# Patient Record
Sex: Female | Born: 1970 | Race: White | Hispanic: No | Marital: Married | Smoking: Former smoker
Health system: Southern US, Community
[De-identification: ages and names within clinical notes are randomized; demographics above are authoritative.]

## PROBLEM LIST (undated history)

## (undated) DIAGNOSIS — F32A Depression, unspecified: Secondary | ICD-10-CM

## (undated) DIAGNOSIS — E785 Hyperlipidemia, unspecified: Secondary | ICD-10-CM

## (undated) DIAGNOSIS — N946 Dysmenorrhea, unspecified: Secondary | ICD-10-CM

## (undated) DIAGNOSIS — K829 Disease of gallbladder, unspecified: Secondary | ICD-10-CM

## (undated) DIAGNOSIS — M2550 Pain in unspecified joint: Secondary | ICD-10-CM

## (undated) DIAGNOSIS — E669 Obesity, unspecified: Secondary | ICD-10-CM

## (undated) DIAGNOSIS — M7989 Other specified soft tissue disorders: Secondary | ICD-10-CM

## (undated) DIAGNOSIS — I10 Essential (primary) hypertension: Secondary | ICD-10-CM

## (undated) DIAGNOSIS — M549 Dorsalgia, unspecified: Secondary | ICD-10-CM

## (undated) DIAGNOSIS — F329 Major depressive disorder, single episode, unspecified: Secondary | ICD-10-CM

## (undated) DIAGNOSIS — R7303 Prediabetes: Secondary | ICD-10-CM

## (undated) DIAGNOSIS — E039 Hypothyroidism, unspecified: Secondary | ICD-10-CM

## (undated) DIAGNOSIS — N83209 Unspecified ovarian cyst, unspecified side: Secondary | ICD-10-CM

## (undated) DIAGNOSIS — K5900 Constipation, unspecified: Secondary | ICD-10-CM

## (undated) DIAGNOSIS — M1990 Unspecified osteoarthritis, unspecified site: Secondary | ICD-10-CM

## (undated) DIAGNOSIS — R51 Headache: Secondary | ICD-10-CM

## (undated) HISTORY — DX: Disease of gallbladder, unspecified: K82.9

## (undated) HISTORY — DX: Unspecified ovarian cyst, unspecified side: N83.209

## (undated) HISTORY — DX: Hyperlipidemia, unspecified: E78.5

## (undated) HISTORY — DX: Prediabetes: R73.03

## (undated) HISTORY — DX: Obesity, unspecified: E66.9

## (undated) HISTORY — DX: Hypothyroidism, unspecified: E03.9

## (undated) HISTORY — DX: Dysmenorrhea, unspecified: N94.6

## (undated) HISTORY — DX: Dorsalgia, unspecified: M54.9

## (undated) HISTORY — PX: HIP SURGERY: SHX245

## (undated) HISTORY — DX: Constipation, unspecified: K59.00

## (undated) HISTORY — DX: Unspecified osteoarthritis, unspecified site: M19.90

## (undated) HISTORY — DX: Other specified soft tissue disorders: M79.89

## (undated) HISTORY — DX: Major depressive disorder, single episode, unspecified: F32.9

## (undated) HISTORY — PX: ENDOMETRIAL ABLATION: SHX621

## (undated) HISTORY — PX: OTHER SURGICAL HISTORY: SHX169

## (undated) HISTORY — DX: Headache: R51

## (undated) HISTORY — DX: Essential (primary) hypertension: I10

## (undated) HISTORY — DX: Pain in unspecified joint: M25.50

## (undated) HISTORY — DX: Depression, unspecified: F32.A

## (undated) HISTORY — PX: NASAL SINUS SURGERY: SHX719

---

## 1998-08-27 ENCOUNTER — Other Ambulatory Visit: Admission: RE | Admit: 1998-08-27 | Discharge: 1998-08-27 | Payer: Self-pay | Admitting: Obstetrics & Gynecology

## 1998-09-27 ENCOUNTER — Other Ambulatory Visit: Admission: RE | Admit: 1998-09-27 | Discharge: 1998-09-27 | Payer: Self-pay | Admitting: Obstetrics & Gynecology

## 1998-12-23 ENCOUNTER — Other Ambulatory Visit: Admission: RE | Admit: 1998-12-23 | Discharge: 1998-12-23 | Payer: Self-pay | Admitting: Obstetrics & Gynecology

## 1999-06-16 ENCOUNTER — Other Ambulatory Visit: Admission: RE | Admit: 1999-06-16 | Discharge: 1999-06-16 | Payer: Self-pay | Admitting: Obstetrics & Gynecology

## 2000-03-30 ENCOUNTER — Other Ambulatory Visit: Admission: RE | Admit: 2000-03-30 | Discharge: 2000-03-30 | Payer: Self-pay | Admitting: Obstetrics & Gynecology

## 2000-07-07 ENCOUNTER — Ambulatory Visit (HOSPITAL_COMMUNITY): Admission: RE | Admit: 2000-07-07 | Discharge: 2000-07-07 | Payer: Self-pay | Admitting: Obstetrics and Gynecology

## 2000-07-07 ENCOUNTER — Encounter: Payer: Self-pay | Admitting: Obstetrics & Gynecology

## 2000-08-20 ENCOUNTER — Inpatient Hospital Stay (HOSPITAL_COMMUNITY): Admission: AD | Admit: 2000-08-20 | Discharge: 2000-08-20 | Payer: Self-pay | Admitting: Obstetrics and Gynecology

## 2000-09-09 ENCOUNTER — Inpatient Hospital Stay (HOSPITAL_COMMUNITY): Admission: AD | Admit: 2000-09-09 | Discharge: 2000-09-09 | Payer: Self-pay | Admitting: Obstetrics and Gynecology

## 2000-09-10 ENCOUNTER — Encounter: Payer: Self-pay | Admitting: Obstetrics and Gynecology

## 2000-09-10 ENCOUNTER — Inpatient Hospital Stay (HOSPITAL_COMMUNITY): Admission: AD | Admit: 2000-09-10 | Discharge: 2000-09-10 | Payer: Self-pay | Admitting: Obstetrics and Gynecology

## 2000-09-23 ENCOUNTER — Inpatient Hospital Stay (HOSPITAL_COMMUNITY): Admission: AD | Admit: 2000-09-23 | Discharge: 2000-09-28 | Payer: Self-pay | Admitting: Obstetrics & Gynecology

## 2000-11-05 ENCOUNTER — Other Ambulatory Visit: Admission: RE | Admit: 2000-11-05 | Discharge: 2000-11-05 | Payer: Self-pay | Admitting: Obstetrics & Gynecology

## 2002-01-25 ENCOUNTER — Other Ambulatory Visit: Admission: RE | Admit: 2002-01-25 | Discharge: 2002-01-25 | Payer: Self-pay | Admitting: Obstetrics & Gynecology

## 2002-09-05 ENCOUNTER — Encounter: Admission: RE | Admit: 2002-09-05 | Discharge: 2002-09-05 | Payer: Self-pay | Admitting: Family Medicine

## 2002-09-05 ENCOUNTER — Encounter: Payer: Self-pay | Admitting: Family Medicine

## 2002-09-06 ENCOUNTER — Encounter: Payer: Self-pay | Admitting: Emergency Medicine

## 2002-09-06 ENCOUNTER — Emergency Department (HOSPITAL_COMMUNITY): Admission: EM | Admit: 2002-09-06 | Discharge: 2002-09-06 | Payer: Self-pay | Admitting: Emergency Medicine

## 2002-09-07 ENCOUNTER — Encounter: Payer: Self-pay | Admitting: Surgery

## 2002-09-07 ENCOUNTER — Observation Stay (HOSPITAL_COMMUNITY): Admission: AD | Admit: 2002-09-07 | Discharge: 2002-09-08 | Payer: Self-pay | Admitting: Surgery

## 2002-09-07 ENCOUNTER — Encounter (INDEPENDENT_AMBULATORY_CARE_PROVIDER_SITE_OTHER): Payer: Self-pay | Admitting: *Deleted

## 2002-12-22 ENCOUNTER — Emergency Department (HOSPITAL_COMMUNITY): Admission: EM | Admit: 2002-12-22 | Discharge: 2002-12-22 | Payer: Self-pay | Admitting: Emergency Medicine

## 2002-12-22 ENCOUNTER — Encounter: Payer: Self-pay | Admitting: Emergency Medicine

## 2003-04-13 ENCOUNTER — Other Ambulatory Visit: Admission: RE | Admit: 2003-04-13 | Discharge: 2003-04-13 | Payer: Self-pay | Admitting: Obstetrics & Gynecology

## 2003-05-05 HISTORY — PX: CHOLECYSTECTOMY: SHX55

## 2004-03-12 ENCOUNTER — Encounter: Payer: Self-pay | Admitting: Cardiology

## 2004-03-12 ENCOUNTER — Ambulatory Visit: Payer: Self-pay | Admitting: *Deleted

## 2004-03-12 ENCOUNTER — Observation Stay (HOSPITAL_COMMUNITY): Admission: EM | Admit: 2004-03-12 | Discharge: 2004-03-13 | Payer: Self-pay | Admitting: Emergency Medicine

## 2004-04-07 ENCOUNTER — Encounter: Admission: RE | Admit: 2004-04-07 | Discharge: 2004-04-07 | Payer: Self-pay | Admitting: Family Medicine

## 2004-04-24 ENCOUNTER — Ambulatory Visit (HOSPITAL_COMMUNITY): Admission: RE | Admit: 2004-04-24 | Discharge: 2004-04-24 | Payer: Self-pay | Admitting: Family Medicine

## 2004-04-24 ENCOUNTER — Encounter (INDEPENDENT_AMBULATORY_CARE_PROVIDER_SITE_OTHER): Payer: Self-pay | Admitting: *Deleted

## 2004-06-13 ENCOUNTER — Other Ambulatory Visit: Admission: RE | Admit: 2004-06-13 | Discharge: 2004-06-13 | Payer: Self-pay | Admitting: Obstetrics & Gynecology

## 2004-12-01 ENCOUNTER — Encounter: Admission: RE | Admit: 2004-12-01 | Discharge: 2004-12-01 | Payer: Self-pay | Admitting: Surgery

## 2005-05-04 LAB — HM MAMMOGRAPHY: HM Mammogram: NORMAL

## 2005-08-03 ENCOUNTER — Other Ambulatory Visit: Admission: RE | Admit: 2005-08-03 | Discharge: 2005-08-03 | Payer: Self-pay | Admitting: Obstetrics & Gynecology

## 2006-03-24 ENCOUNTER — Emergency Department (HOSPITAL_COMMUNITY): Admission: EM | Admit: 2006-03-24 | Discharge: 2006-03-24 | Payer: Self-pay | Admitting: Emergency Medicine

## 2006-11-03 ENCOUNTER — Encounter: Admission: RE | Admit: 2006-11-03 | Discharge: 2006-11-03 | Payer: Self-pay | Admitting: Family Medicine

## 2008-01-31 ENCOUNTER — Inpatient Hospital Stay (HOSPITAL_COMMUNITY): Admission: AD | Admit: 2008-01-31 | Discharge: 2008-01-31 | Payer: Self-pay | Admitting: Obstetrics and Gynecology

## 2008-03-03 ENCOUNTER — Inpatient Hospital Stay (HOSPITAL_COMMUNITY): Admission: AD | Admit: 2008-03-03 | Discharge: 2008-03-03 | Payer: Self-pay | Admitting: Obstetrics and Gynecology

## 2008-03-05 ENCOUNTER — Inpatient Hospital Stay (HOSPITAL_COMMUNITY): Admission: RE | Admit: 2008-03-05 | Discharge: 2008-03-08 | Payer: Self-pay | Admitting: Obstetrics & Gynecology

## 2008-03-05 ENCOUNTER — Encounter (INDEPENDENT_AMBULATORY_CARE_PROVIDER_SITE_OTHER): Payer: Self-pay | Admitting: Obstetrics & Gynecology

## 2008-03-09 ENCOUNTER — Encounter: Admission: RE | Admit: 2008-03-09 | Discharge: 2008-04-06 | Payer: Self-pay | Admitting: Obstetrics & Gynecology

## 2008-03-11 ENCOUNTER — Inpatient Hospital Stay (HOSPITAL_COMMUNITY): Admission: AD | Admit: 2008-03-11 | Discharge: 2008-03-11 | Payer: Self-pay | Admitting: Obstetrics and Gynecology

## 2008-04-07 ENCOUNTER — Inpatient Hospital Stay (HOSPITAL_COMMUNITY): Admission: AD | Admit: 2008-04-07 | Discharge: 2008-04-08 | Payer: Self-pay | Admitting: Obstetrics and Gynecology

## 2008-05-04 LAB — CONVERTED CEMR LAB: Pap Smear: NORMAL

## 2009-11-28 ENCOUNTER — Ambulatory Visit: Payer: Self-pay | Admitting: Internal Medicine

## 2009-11-28 DIAGNOSIS — F17200 Nicotine dependence, unspecified, uncomplicated: Secondary | ICD-10-CM | POA: Insufficient documentation

## 2009-11-28 DIAGNOSIS — L680 Hirsutism: Secondary | ICD-10-CM | POA: Insufficient documentation

## 2009-11-28 DIAGNOSIS — F325 Major depressive disorder, single episode, in full remission: Secondary | ICD-10-CM | POA: Insufficient documentation

## 2009-11-28 DIAGNOSIS — I10 Essential (primary) hypertension: Secondary | ICD-10-CM | POA: Insufficient documentation

## 2009-11-28 DIAGNOSIS — R51 Headache: Secondary | ICD-10-CM | POA: Insufficient documentation

## 2009-11-28 DIAGNOSIS — R519 Headache, unspecified: Secondary | ICD-10-CM | POA: Insufficient documentation

## 2009-11-28 LAB — CONVERTED CEMR LAB
ALT: 23 units/L (ref 0–35)
AST: 18 units/L (ref 0–37)
Albumin: 4.1 g/dL (ref 3.5–5.2)
Alkaline Phosphatase: 85 units/L (ref 39–117)
BUN: 12 mg/dL (ref 6–23)
Basophils Absolute: 0.1 10*3/uL (ref 0.0–0.1)
Basophils Relative: 0.4 % (ref 0.0–3.0)
Bilirubin, Direct: 0.1 mg/dL (ref 0.0–0.3)
CO2: 26 meq/L (ref 19–32)
Calcium: 8.7 mg/dL (ref 8.4–10.5)
Chloride: 100 meq/L (ref 96–112)
Creatinine, Ser: 0.8 mg/dL (ref 0.4–1.2)
Eosinophils Absolute: 0 10*3/uL (ref 0.0–0.7)
Eosinophils Relative: 0.1 % (ref 0.0–5.0)
GFR calc non Af Amer: 89.39 mL/min (ref >60)
Glucose, Bld: 85 mg/dL (ref 70–99)
HCT: 41.4 % (ref 36.0–46.0)
Hemoglobin: 14 g/dL (ref 12.0–15.0)
Lymphocytes Relative: 17.6 % (ref 12.0–46.0)
Lymphs Abs: 2.9 10*3/uL (ref 0.7–4.0)
MCHC: 33.8 g/dL (ref 30.0–36.0)
MCV: 87.2 fL (ref 78.0–100.0)
Monocytes Absolute: 1 10*3/uL (ref 0.1–1.0)
Monocytes Relative: 5.7 % (ref 3.0–12.0)
Neutro Abs: 12.8 10*3/uL — ABNORMAL HIGH (ref 1.4–7.7)
Neutrophils Relative %: 76.2 % (ref 43.0–77.0)
Platelets: 340 10*3/uL (ref 150.0–400.0)
Potassium: 3.6 meq/L (ref 3.5–5.1)
RBC: 4.75 M/uL (ref 3.87–5.11)
RDW: 14.3 % (ref 11.5–14.6)
Sodium: 141 meq/L (ref 135–145)
TSH: 0.12 microintl units/mL — ABNORMAL LOW (ref 0.35–5.50)
Testosterone: 21.47 ng/dL (ref 10.00–70.00)
Total Bilirubin: 0.6 mg/dL (ref 0.3–1.2)
Total Protein: 7.7 g/dL (ref 6.0–8.3)
WBC: 16.8 10*3/uL — ABNORMAL HIGH (ref 4.5–10.5)

## 2009-11-29 ENCOUNTER — Encounter: Payer: Self-pay | Admitting: Internal Medicine

## 2009-12-02 ENCOUNTER — Ambulatory Visit: Payer: Self-pay | Admitting: Internal Medicine

## 2009-12-02 DIAGNOSIS — D72829 Elevated white blood cell count, unspecified: Secondary | ICD-10-CM | POA: Insufficient documentation

## 2009-12-02 LAB — CONVERTED CEMR LAB
Albumin ELP: 54.5 % — ABNORMAL LOW (ref 55.8–66.1)
Alpha-1-Globulin: 4.3 % (ref 2.9–4.9)
Alpha-2-Globulin: 12.6 % — ABNORMAL HIGH (ref 7.1–11.8)
Basophils Absolute: 0 10*3/uL (ref 0.0–0.1)
Basophils Relative: 0.2 % (ref 0.0–3.0)
Beta Globulin: 6.9 % (ref 4.7–7.2)
Eosinophils Absolute: 0 10*3/uL (ref 0.0–0.7)
Eosinophils Relative: 0 % (ref 0.0–5.0)
Gamma Globulin: 14.8 % (ref 11.1–18.8)
HCT: 42 % (ref 36.0–46.0)
Hemoglobin: 14 g/dL (ref 12.0–15.0)
Lymphocytes Relative: 18.5 % (ref 12.0–46.0)
Lymphs Abs: 2.8 10*3/uL (ref 0.7–4.0)
MCHC: 33.4 g/dL (ref 30.0–36.0)
MCV: 87.5 fL (ref 78.0–100.0)
Monocytes Absolute: 1.1 10*3/uL — ABNORMAL HIGH (ref 0.1–1.0)
Monocytes Relative: 7.4 % (ref 3.0–12.0)
Neutro Abs: 11.2 10*3/uL — ABNORMAL HIGH (ref 1.4–7.7)
Neutrophils Relative %: 73.9 % (ref 43.0–77.0)
Platelets: 331 10*3/uL (ref 150.0–400.0)
RBC: 4.8 M/uL (ref 3.87–5.11)
RDW: 14.3 % (ref 11.5–14.6)
Total Protein, Serum Electrophoresis: 7.9 g/dL (ref 6.0–8.3)
WBC: 15.1 10*3/uL — ABNORMAL HIGH (ref 4.5–10.5)

## 2010-01-08 ENCOUNTER — Encounter: Payer: Self-pay | Admitting: Internal Medicine

## 2010-01-08 ENCOUNTER — Telehealth: Payer: Self-pay | Admitting: Internal Medicine

## 2010-02-13 ENCOUNTER — Encounter: Payer: Self-pay | Admitting: Internal Medicine

## 2010-03-04 ENCOUNTER — Telehealth: Payer: Self-pay | Admitting: Internal Medicine

## 2010-05-04 HISTORY — PX: THYROID SURGERY: SHX805

## 2010-06-05 NOTE — Letter (Signed)
Summary: Referral - not able to see patient  [facility]  [address] [place], [place] [address]   Phone: [phone]  Fax: [phone]    February 13, 2010   Etta Grandchild, M.D. [address] [place], [place] [address]   Re:   ARMENTHA Okelley DOB:  [birth year] MRN:   [id number]    Dear Dr. Yetta Barre:  Thank you for your kind referral of the above patient.  We have attempted to schedule the recommended procedure Screening Colonoscopy but have not been able to schedule because:   X  The patient was not available by phone and/or has not returned our calls.  ___ The patient declined to schedule the procedure at this time.  We appreciate the referral and hope that we will have the opportunity to treat this patient in the future.    Sincerely,    [facility] [phone]

## 2010-06-05 NOTE — Letter (Signed)
Summary: Results Follow-up Letter  [facility]  [address] [place], [place] [address]   Phone: [phone]  Fax: [phone]    11/29/2009  [address] [place], [place]  [address]  Dear Holly Mcmillan,   The following are the results of your recent test(s):  Test     Result     Thyroid     slightly overactive CBC       elevated WBC count Testosterone   normal Liver/kidney   normal   _________________________________________________________  Please call for an appointment soon, in the meantime I have sent you to see an Endocrinologist about the overactive thyroid gland _________________________________________________________ _________________________________________________________ _________________________________________________________  Sincerely,  Sanda Linger MD [facility]

## 2010-06-05 NOTE — Assessment & Plan Note (Signed)
Summary: FOLLOW UP TO DISCUSS LAB RESULTS PER MD-LB   Vital Signs:  Patient profile:   40 year old female Menstrual status:  regular Height:      64 inches Weight:      233 pounds BMI:     40.14 O2 Sat:      97 % on Room air Temp:     98.6 degrees F oral Pulse rate:   88 / minute Pulse rhythm:   regular Resp:     16 per minute BP sitting:   114 / 88  (left arm) Cuff size:   large  Vitals Entered By: Rock Nephew [name] (December 02, 2009 3:53 PM)  Nutrition Counseling: Patient's BMI is greater than 25 and therefore counseled on weight management options.  O2 Flow:  Room air CC: follow-up visit// lab review, URI symptoms Is Patient Diabetic? No Pain Assessment Patient in pain? no        Primary Care Provider:  Etta Grandchild MD  CC:  follow-up visit// lab review and URI symptoms.  History of Present Illness:  URI Symptoms      This is a 40 year old woman who presents with URI symptoms.  The symptoms began 3 days ago.  The severity is described as moderate.  The patient reports nasal congestion, purulent nasal discharge, sore throat, and productive cough, but denies clear nasal discharge, dry cough, earache, and sick contacts.  The patient denies fever, stiff neck, dyspnea, wheezing, rash, vomiting, diarrhea, use of an antipyretic, and response to antipyretic.  The patient denies headache, muscle aches, and severe fatigue.  Risk factors for Strep sinusitis include unilateral facial pain, unilateral nasal discharge, and double sickening.  The patient denies the following risk factors for Strep sinusitis: poor response to decongestant, tooth pain, Strep exposure, tender adenopathy, and absence of cough.    Preventive Screening-Counseling & Management  Alcohol-Tobacco     Alcohol drinks/day: 0     Smoking Status: current     Smoking Cessation Counseling: yes     Smoke Cessation Stage: precontemplative     Packs/Day: 1.0     Year Started: 1987     Pack years: 25     Tobacco  Counseling: to quit use of tobacco products  Hep-HIV-STD-Contraception     Hepatitis Risk: no risk noted     HIV Risk: no risk noted     STD Risk: no risk noted  Clinical Review Panels:  Diabetes Management   Creatinine:  0.8 (11/28/2009)  CBC   WBC:  16.8 (11/28/2009)   RBC:  4.75 (11/28/2009)   Hgb:  14.0 (11/28/2009)   Hct:  41.4 (11/28/2009)   Platelets:  340.0 (11/28/2009)   MCV  87.2 (11/28/2009)   MCHC  33.8 (11/28/2009)   RDW  14.3 (11/28/2009)   PMN:  76.2 (11/28/2009)   Lymphs:  17.6 (11/28/2009)   Monos:  5.7 (11/28/2009)   Eosinophils:  0.1 (11/28/2009)   Basophil:  0.4 (11/28/2009)  Complete Metabolic Panel   Glucose:  85 (11/28/2009)   Sodium:  141 (11/28/2009)   Potassium:  3.6 (11/28/2009)   Chloride:  100 (11/28/2009)   CO2:  26 (11/28/2009)   BUN:  12 (11/28/2009)   Creatinine:  0.8 (11/28/2009)   Albumin:  4.1 (11/28/2009)   Total Protein:  7.7 (11/28/2009)   Calcium:  8.7 (11/28/2009)   Total Bili:  0.6 (11/28/2009)   Alk Phos:  85 (11/28/2009)   SGPT (ALT):  23 (11/28/2009)   SGOT (AST):  18 (11/28/2009)   Medications Prior to Update: 1)  Spironolactone 25 Mg Tabs (Spironolactone) .... One By Mouth Once Daily For High Blood Pressure 2)  Wellbutrin Xl 150 Mg Xr24h-Tab (Bupropion Hcl) .... One By Mouth Once Daily  Current Medications (verified): 1)  Spironolactone 25 Mg Tabs (Spironolactone) .... One By Mouth Once Daily For High Blood Pressure 2)  Wellbutrin Xl 150 Mg Xr24h-Tab (Bupropion Hcl) .... One By Mouth Once Daily 3)  Avelox 400 Mg Tabs (Moxifloxacin Hcl) .... One By Mouth Once Daily For Infection  Allergies (verified): 1)  ! Sulfa  Past History:  Past Medical History: Last updated: 11/28/2009 Depression Headache Hypertension Ovarian cysts Dysmenorrhea  Past Surgical History: Last updated: 11/28/2009 Caesarean section Cholecystectomy Sinus surgery  Family History: Last updated: 11/28/2009 Family History of  Alcoholism/Addiction Family History of Arthritis Family History Hypertension Family History Kidney disease Family History Lung cancer Family History Other cancer- Intestinal, Pancreatic and Leukmeia Family History Ovarian cancer Family History Uterine cancer Family History Heart disease Family History Emotional/Mental illness Family History of Colon CA 1st degree relative <60  Social History: Last updated: 11/28/2009 Married Current Smoker Alcohol use-no Drug use-no Regular exercise-yes Occupation: mortgage insurance  Risk Factors: Alcohol Use: 0 (12/02/2009) Exercise: yes (11/28/2009)  Risk Factors: Smoking Status: current (12/02/2009) Packs/Day: 1.0 (12/02/2009)  Family History: Reviewed history from 11/28/2009 and no changes required. Family History of Alcoholism/Addiction Family History of Arthritis Family History Hypertension Family History Kidney disease Family History Lung cancer Family History Other cancer- Intestinal, Pancreatic and Leukmeia Family History Ovarian cancer Family History Uterine cancer Family History Heart disease Family History Emotional/Mental illness Family History of Colon CA 1st degree relative <60  Social History: Reviewed history from 11/28/2009 and no changes required. Married Current Smoker Alcohol use-no Drug use-no Regular exercise-yes Occupation: Mudlogger  Review of Systems  The patient denies anorexia, fever, weight loss, hoarseness, chest pain, peripheral edema, abdominal pain, hematuria, suspicious skin lesions, enlarged lymph nodes, and angioedema.   General:  Denies chills, fatigue, fever, loss of appetite, malaise, sleep disorder, sweats, weakness, and weight loss.  Physical Exam  General:  alert, well-developed, well-nourished, well-hydrated, appropriate dress, normal appearance, healthy-appearing, cooperative to examination, good hygiene, and overweight-appearing.   Head:  She has fine villous hair  across her upper lip region and around her chin, there are no dark/thick hairs, there is very minimal hair just above the suprapubic region. no alopecia.   Ears:  R ear normal and L ear normal.   Mouth:  Oral mucosa and oropharynx without lesions or exudates.  Teeth in good repair. Neck:  supple, full ROM, no masses, no thyromegaly, no thyroid nodules or tenderness, normal carotid upstroke, no carotid bruits, no cervical lymphadenopathy, and no neck tenderness.   Lungs:  normal respiratory effort, no intercostal retractions, no accessory muscle use, normal breath sounds, no dullness, no fremitus, no crackles, and no wheezes.   Heart:  normal rate, regular rhythm, no murmur, no gallop, no rub, and no JVD.   Abdomen:  soft, non-tender, normal bowel sounds, no distention, no masses, no guarding, no rigidity, no rebound tenderness, no abdominal hernia, no inguinal hernia, no hepatomegaly, and no splenomegaly.   Msk:  normal ROM, no joint tenderness, no joint swelling, no joint warmth, no redness over joints, no joint deformities, no joint instability, and no crepitation.   Pulses:  R and L carotid,radial,femoral,dorsalis pedis and posterior tibial pulses are full and equal bilaterally Extremities:  No clubbing, cyanosis, edema, or deformity noted with  normal full range of motion of all joints.   Neurologic:  No cranial nerve deficits noted. Station and gait are normal. Plantar reflexes are down-going bilaterally. DTRs are symmetrical throughout. Sensory, motor and coordinative functions appear intact. Skin:  turgor normal, color normal, no rashes, no suspicious lesions, no ecchymoses, no petechiae, no purpura, no ulcerations, no edema, and actinic keratosis.   Cervical Nodes:  no anterior cervical adenopathy and no posterior cervical adenopathy.   Axillary Nodes:  no R axillary adenopathy and no L axillary adenopathy.   Inguinal Nodes:  no R inguinal adenopathy and no L inguinal adenopathy.   Psych:   Cognition and judgment appear intact. Alert and cooperative with normal attention span and concentration. No apparent delusions, illusions, hallucinations and subdued.     Impression & Recommendations:  Problem # 1:  LEUKOCYTOSIS (ICD-288.60) Assessment New will screen for lymphoproliferative disease Orders: Venipuncture (23536) TLB-CBC Platelet - w/Differential (85025-CBCD) T-SPE w/reflex to IFE (14431-54008)  Problem # 2:  BRONCHITIS-ACUTE (ICD-466.0) Assessment: New  Her updated medication list for this problem includes:    Avelox 400 Mg Tabs (Moxifloxacin hcl) ..... One by mouth once daily for infection  Take antibiotics and other medications as directed. Encouraged to push clear liquids, get enough rest, and take acetaminophen as needed. To be seen in 5-7 days if no improvement, sooner if worse.  Problem # 3:  HYPERTENSION (ICD-401.9) Assessment: Improved  Her updated medication list for this problem includes:    Spironolactone 25 Mg Tabs (Spironolactone) ..... One by mouth once daily for high blood pressure  BP today: 114/88 Prior BP: 134/82 (11/28/2009)  Labs Reviewed: K+: 3.6 (11/28/2009) Creat: : 0.8 (11/28/2009)     Problem # 4:  TOBACCO USE (ICD-305.1) Assessment: Unchanged  Encouraged smoking cessation and discussed different methods for smoking cessation.   Complete Medication List: 1)  Spironolactone 25 Mg Tabs (Spironolactone) .... One by mouth once daily for high blood pressure 2)  Wellbutrin Xl 150 Mg Xr24h-tab (Bupropion hcl) .... One by mouth once daily 3)  Avelox 400 Mg Tabs (Moxifloxacin hcl) .... One by mouth once daily for infection  Patient Instructions: 1)  Please schedule a follow-up appointment in 1 month. 2)  Tobacco is very bad for your health and your loved ones! You Should stop smoking!. 3)  Stop Smoking Tips: Choose a Quit date. Cut down before the Quit date. decide what you will do as a substitute when you feel the urge to  smoke(gum,toothpick,exercise). 4)  Take your antibiotic as prescribed until ALL of it is gone, but stop if you develop a rash or swelling and contact our office as soon as possible. 5)  Acute bronchitis symptoms for less than 10 days are not helped by antibiotics. take over the counter cough medications. call if no improvment in  5-7 days, sooner if increasing cough, fever, or new symptoms( shortness of breath, chest pain). Prescriptions: AVELOX 400 MG TABS (MOXIFLOXACIN HCL) One by mouth once daily for infection  #5 x 0   Entered and Authorized by:   Etta Grandchild MD   Signed by:   Etta Grandchild MD on 12/02/2009   Method used:   Samples Given   RxID:   [id number]

## 2010-06-05 NOTE — Medication Information (Signed)
Summary: Bupropion HCL / [facility]  Bupropion HCL / [facility]   Imported By: Lennie Odor 01/10/2010 14:45:01  _____________________________________________________________________  External Attachment:    Type:   Image     Comment:   External Document

## 2010-06-05 NOTE — Progress Notes (Signed)
Summary: refill--Bupropion HCL, [facility]  Phone Note Refill Request Message from:  Fax from [facility] on January 08, 2010 8:23 AM  Refills Requested: Medication #1:  WELLBUTRIN XL 150 MG XR24H-TAB One by mouth once daily.   Dosage confirmed as above?Dosage Confirmed   Supply Requested: 3 months Approval signed by Dr Yetta Barre for #90 RF x 1 year.   Initial call taken by: Mervin Kung [name] (AAMA),  January 08, 2010 8:25 AM

## 2010-06-05 NOTE — Progress Notes (Signed)
Summary: [facility] rx  Phone Note Refill Request Message from:  Fax from Pharmacy on March 04, 2010 8:30 AM  Refills Requested: Medication #1:  SPIRONOLACTONE 25 MG TABS One by mouth once daily for high blood pressure   Supply Requested: 3 months   Notes: [facility] Initial call taken by: Rock Nephew [name],  March 04, 2010 8:30 AM    Prescriptions: SPIRONOLACTONE 25 MG TABS (SPIRONOLACTONE) One by mouth once daily for high blood pressure  #90 x 3   Entered by:   Rock Nephew [name]   Authorized by:   Etta Grandchild MD   Signed by:   Rock Nephew [name] on 03/04/2010   Method used:   Faxed to ...       [facility] (mail-order)             , [place]         Ph: [phone]       Fax: [phone]   RxID:   [id number]

## 2010-06-05 NOTE — Assessment & Plan Note (Signed)
Summary: NEW/UNITED HC/#/CD   Vital Signs:  Patient profile:   40 year old female Menstrual status:  regular LMP:     11/26/2009 Height:      64 inches Weight:      233 pounds BMI:     40.14 O2 Sat:      99 % on Room air Temp:     97.2 degrees F oral Pulse rate:   82 / minute Pulse rhythm:   regular Resp:     16 per minute BP sitting:   134 / 82  (left arm) Cuff size:   large  Vitals Entered By: Rock Nephew [name] (November 28, 2009 1:26 PM)  Nutrition Counseling: Patient's BMI is greater than 25 and therefore counseled on weight management options.  O2 Flow:  Room air CC: new pt// c/o hair loss, facial hair growth w menstral headache and pain// also discuss depression, Depression LMP (date): 11/26/2009     Menstrual Status regular Enter LMP: 11/26/2009 Last PAP Result normal   Primary Care Provider:  Etta Grandchild MD  CC:  new pt// c/o hair loss, facial hair growth w menstral headache and pain// also discuss depression, and Depression.  History of Present Illness: New to me she complains of a several year hx. of hair thinning on her scalp and hair growth across her upper lip and chin. She occasionally has to pluck a large black hair but most of it is fine hair. Her mother has facial hair as well.  Depression History:      The patient presents with symptoms of depression which have been present for greater than two weeks.  She notes that the symptoms started approximately 08/06/2009.  The patient is having a depressed mood most of the day and has a diminished interest in her usual daily activities.  Positive alarm features for depression include significant weight gain, hypersomnia, psychomotor retardation, and fatigue (loss of energy).  However, she denies significant weight loss, insomnia, psychomotor agitation, feelings of worthlessness (guilt), impaired concentration (indecisiveness), and recurrent thoughts of death or suicide.  The patient denies symptoms of a manic  disorder including persistently & abnormally elevated mood, abnormally & persistently irritable mood, less need for sleep, talkative or feels need to keep talking, distractibility, flight of ideas, increase in goal-directed activity, psychomotor agitation, inflated self-esteem or grandiosity, excessive buying sprees, excessive sexual indiscretions, and excessive foolish business investments.        Risk factors for depression include a personal history of depression.  The patient denies that she feels like life is not worth living, denies that she wishes that she were dead, and denies that she has thought about ending her life.         Preventive Screening-Counseling & Management  Alcohol-Tobacco     Alcohol drinks/day: 0     Smoking Status: current     Smoking Cessation Counseling: yes     Smoke Cessation Stage: precontemplative     Packs/Day: 1.0     Year Started: 1987     Pack years: 20  Caffeine-Diet-Exercise     Does Patient Exercise: yes     PHQ-9 Score: 10-14 moderate depression     Depression Counseling: further diagnostic testing and/or other treatment is indicated  Hep-HIV-STD-Contraception     Hepatitis Risk: no risk noted     HIV Risk: no risk noted     STD Risk: no risk noted  Safety-Violence-Falls     Seat Belt Use: yes     Helmet  Use: yes     Firearms in the Home: firearms in the home     Firearm Counseling: to practice firearm safety     Smoke Detectors: yes     Violence in the Home: no risk noted     Sexual Abuse: no      Sexual History:  currently monogamous.        Drug Use:  never and no.        Blood Transfusions:  no.    Current Medications (verified): 1)  None  Allergies (verified): 1)  ! Sulfa  Past History:  Past Medical History: Depression Headache Hypertension Ovarian cysts Dysmenorrhea  Past Surgical History: Caesarean section Cholecystectomy Sinus surgery  Family History: Family History of Alcoholism/Addiction Family History  of Arthritis Family History Hypertension Family History Kidney disease Family History Lung cancer Family History Other cancer- Intestinal, Pancreatic and Leukmeia Family History Ovarian cancer Family History Uterine cancer Family History Heart disease Family History Emotional/Mental illness Family History of Colon CA 1st degree relative <60  Social History: Married Current Smoker Alcohol use-no Drug use-no Regular exercise-yes Occupation: Mudlogger Smoking Status:  current Drug Use:  never, no Does Patient Exercise:  yes Packs/Day:  1.0 Hepatitis Risk:  no risk noted HIV Risk:  no risk noted STD Risk:  no risk noted Seat Belt Use:  yes Sexual History:  currently monogamous Blood Transfusions:  no  Review of Systems       The patient complains of weight gain and depression.  The patient denies anorexia, fever, weight loss, hoarseness, chest pain, syncope, dyspnea on exertion, peripheral edema, prolonged cough, hemoptysis, abdominal pain, hematuria, suspicious skin lesions, difficulty walking, angioedema, and breast masses.   Derm:  Complains of hair loss; denies changes in color of skin, changes in nail beds, dryness, excessive perspiration, flushing, insect bite(s), itching, lesion(s), poor wound healing, and rash. Endo:  Complains of weight change; denies cold intolerance, excessive hunger, excessive thirst, excessive urination, heat intolerance, and polyuria.  Physical Exam  General:  alert, well-developed, well-nourished, well-hydrated, appropriate dress, normal appearance, healthy-appearing, cooperative to examination, good hygiene, and overweight-appearing.   Head:  She has fine villous hair across her upper lip region and around her chin, there are no dark/thick hairs, there is very minimal hair just above the suprapubic region. no alopecia.   Eyes:  vision grossly intact, pupils equal, pupils round, pupils reactive to light, and no nystagmus.   Mouth:  Oral  mucosa and oropharynx without lesions or exudates.  Teeth in good repair. Neck:  supple, full ROM, no masses, no thyromegaly, no thyroid nodules or tenderness, normal carotid upstroke, no carotid bruits, no cervical lymphadenopathy, and no neck tenderness.   Chest Wall:  no deformities, no tenderness, and no mass.   Lungs:  normal respiratory effort, no intercostal retractions, no accessory muscle use, normal breath sounds, no dullness, no fremitus, no crackles, and no wheezes.   Heart:  normal rate, regular rhythm, no murmur, no gallop, no rub, and no JVD.   Abdomen:  soft, non-tender, normal bowel sounds, no distention, no masses, no guarding, no rigidity, no rebound tenderness, no abdominal hernia, no inguinal hernia, no hepatomegaly, and no splenomegaly.   Msk:  normal ROM, no joint tenderness, no joint swelling, no joint warmth, no redness over joints, no joint deformities, no joint instability, and no crepitation.   Pulses:  R and L carotid,radial,femoral,dorsalis pedis and posterior tibial pulses are full and equal bilaterally Extremities:  No clubbing, cyanosis, edema, or  deformity noted with normal full range of motion of all joints.   Neurologic:  No cranial nerve deficits noted. Station and gait are normal. Plantar reflexes are down-going bilaterally. DTRs are symmetrical throughout. Sensory, motor and coordinative functions appear intact. Skin:  turgor normal, color normal, no rashes, no suspicious lesions, no ecchymoses, no petechiae, no purpura, no ulcerations, no edema, and actinic keratosis.   Cervical Nodes:  no anterior cervical adenopathy and no posterior cervical adenopathy.   Axillary Nodes:  no R axillary adenopathy and no L axillary adenopathy.   Inguinal Nodes:  no R inguinal adenopathy and no L inguinal adenopathy.   Psych:  Cognition and judgment appear intact. Alert and cooperative with normal attention span and concentration. No apparent delusions, illusions,  hallucinationssubdued.     Impression & Recommendations:  Problem # 1:  TOBACCO USE (ICD-305.1) Assessment New  Encouraged smoking cessation and discussed different methods for smoking cessation.   Orders: Tobacco use cessation intermediate 3-10 minutes (24401)  Problem # 2:  HYPERTENSION (ICD-401.9) Assessment: New  Her updated medication list for this problem includes:    Spironolactone 25 Mg Tabs (Spironolactone) ..... One by mouth once daily for high blood pressure  Orders: TLB-BMP (Basic Metabolic Panel-BMET) (80048-METABOL) TLB-CBC Platelet - w/Differential (85025-CBCD) TLB-Hepatic/Liver Function Pnl (80076-HEPATIC) TLB-TSH (Thyroid Stimulating Hormone) (84443-TSH) TLB-Testosterone, Total (84403-TESTO) Tobacco use cessation intermediate 3-10 minutes (99406)  BP today: 134/82  Problem # 3:  DEPRESSION (ICD-311) Assessment: New  Her updated medication list for this problem includes:    Wellbutrin Xl 150 Mg Xr24h-tab (Bupropion hcl) ..... One by mouth once daily  Orders: TLB-BMP (Basic Metabolic Panel-BMET) (80048-METABOL) TLB-CBC Platelet - w/Differential (85025-CBCD) TLB-Hepatic/Liver Function Pnl (80076-HEPATIC) TLB-TSH (Thyroid Stimulating Hormone) (84443-TSH) TLB-Testosterone, Total (84403-TESTO)  Discussed treatment options, including trial of antidpressant medication. Will refer to behavioral health. Follow-up call in in 24-48 hours and recheck in 2 weeks, sooner as needed. Patient agrees to call if any worsening of symptoms or thoughts of doing harm arise. Verified that the patient has no suicidal ideation at this time.   Problem # 4:  HIRSUTISM (ICD-704.1) wilil check for high testosterone/thyroid dz./abnormal lytes/hyperglycemia, I think this will end up being caused by a familial trait, also she may have PCOS Orders: TLB-BMP (Basic Metabolic Panel-BMET) (80048-METABOL) TLB-CBC Platelet - w/Differential (85025-CBCD) TLB-Hepatic/Liver Function Pnl  (80076-HEPATIC) TLB-TSH (Thyroid Stimulating Hormone) (84443-TSH) TLB-Testosterone, Total (84403-TESTO)  Problem # 5:  FAMILY HISTORY OF COLON CA 1ST DEGREE RELATIVE <60 (ICD-V16.0)  Orders: Gastroenterology Referral (GI)  Complete Medication List: 1)  Spironolactone 25 Mg Tabs (Spironolactone) .... One by mouth once daily for high blood pressure 2)  Wellbutrin Xl 150 Mg Xr24h-tab (Bupropion hcl) .... One by mouth once daily  Other Orders: Tdap => 78yrs IM (02725) Admin 1st Vaccine (36644) Venipuncture (03474)  Patient Instructions: 1)  Please schedule a follow-up appointment in 2 months. 2)  Tobacco is very bad for your health and your loved ones! You Should stop smoking!. 3)  Stop Smoking Tips: Choose a Quit date. Cut down before the Quit date. decide what you will do as a substitute when you feel the urge to smoke(gum,toothpick,exercise). 4)  It is important that you exercise regularly at least 20 minutes 5 times a week. If you develop chest pain, have severe difficulty breathing, or feel very tired , stop exercising immediately and seek medical attention. 5)  You need to lose weight. Consider a lower calorie diet and regular exercise.  6)  Schedule a colonoscopy/sigmoidoscopy to help detect  colon cancer. 7)  Check your Blood Pressure regularly. If it is above 140/90: you should make an appointment. Prescriptions: WELLBUTRIN XL 150 MG XR24H-TAB (BUPROPION HCL) One by mouth once daily  #30 x 11   Entered and Authorized by:   Etta Grandchild MD   Signed by:   Etta Grandchild MD on 11/28/2009   Method used:   Print then Give to Patient   RxID:   [id number] SPIRONOLACTONE 25 MG TABS (SPIRONOLACTONE) One by mouth once daily for high blood pressure  #30 x 11   Entered and Authorized by:   Etta Grandchild MD   Signed by:   Etta Grandchild MD on 11/28/2009   Method used:   Print then Give to Patient   RxID:   [id number]   Preventive Care Screening  Pap Smear:    Date:   05/04/2008    Results:  normal   Mammogram:    Date:  05/04/2005    Results:  normal     Immunizations Administered:  Tetanus Vaccine:    Vaccine Type: Tdap    Site: left deltoid    Mfr: [employer]    Dose: 0.5 ml    Route: IM    Given by: Rock Nephew [name]    Exp. Date: 07/26/2011    Lot #: [id number]    VIS given: 03/22/07 version given November 28, 2009.

## 2010-06-30 ENCOUNTER — Ambulatory Visit (INDEPENDENT_AMBULATORY_CARE_PROVIDER_SITE_OTHER)
Admission: RE | Admit: 2010-06-30 | Discharge: 2010-06-30 | Disposition: A | Discharge status: Home / Self Care | Payer: 59 | Source: Ambulatory Visit | Attending: Internal Medicine | Admitting: Internal Medicine

## 2010-06-30 ENCOUNTER — Other Ambulatory Visit: Payer: Self-pay | Admitting: Internal Medicine

## 2010-06-30 ENCOUNTER — Other Ambulatory Visit: Payer: 59

## 2010-06-30 ENCOUNTER — Ambulatory Visit (INDEPENDENT_AMBULATORY_CARE_PROVIDER_SITE_OTHER): Payer: 59 | Admitting: Internal Medicine

## 2010-06-30 ENCOUNTER — Encounter: Payer: Self-pay | Admitting: Internal Medicine

## 2010-06-30 DIAGNOSIS — M545 Low back pain, unspecified: Secondary | ICD-10-CM

## 2010-06-30 DIAGNOSIS — R059 Cough, unspecified: Secondary | ICD-10-CM

## 2010-06-30 DIAGNOSIS — J45901 Unspecified asthma with (acute) exacerbation: Secondary | ICD-10-CM

## 2010-06-30 DIAGNOSIS — D72829 Elevated white blood cell count, unspecified: Secondary | ICD-10-CM

## 2010-06-30 DIAGNOSIS — R05 Cough: Secondary | ICD-10-CM

## 2010-06-30 DIAGNOSIS — I10 Essential (primary) hypertension: Secondary | ICD-10-CM

## 2010-06-30 DIAGNOSIS — F17200 Nicotine dependence, unspecified, uncomplicated: Secondary | ICD-10-CM

## 2010-06-30 DIAGNOSIS — J3089 Other allergic rhinitis: Secondary | ICD-10-CM

## 2010-06-30 DIAGNOSIS — J301 Allergic rhinitis due to pollen: Secondary | ICD-10-CM | POA: Insufficient documentation

## 2010-06-30 LAB — BASIC METABOLIC PANEL
CO2: 28 mEq/L (ref 19–32)
Calcium: 9.1 mg/dL (ref 8.4–10.5)
Chloride: 103 mEq/L (ref 96–112)
GFR: 106.75 mL/min (ref >60.00)
Glucose, Bld: 68 mg/dL — ABNORMAL LOW (ref 70–99)
Potassium: 4.2 mEq/L (ref 3.5–5.1)
Sodium: 139 mEq/L (ref 135–145)

## 2010-06-30 LAB — CBC WITH DIFFERENTIAL/PLATELET
Basophils Absolute: 0 10*3/uL (ref 0.0–0.1)
Basophils Relative: 0.1 % (ref 0.0–3.0)
Eosinophils Absolute: 0 10*3/uL (ref 0.0–0.7)
Eosinophils Relative: 0.2 % (ref 0.0–5.0)
HCT: 42.8 % (ref 36.0–46.0)
Hemoglobin: 14.7 g/dL (ref 12.0–15.0)
Lymphs Abs: 3 10*3/uL (ref 0.7–4.0)
MCHC: 34.4 g/dL (ref 30.0–36.0)
MCV: 87.2 fl (ref 78.0–100.0)
Monocytes Absolute: 0.9 10*3/uL (ref 0.1–1.0)
Monocytes Relative: 6.6 % (ref 3.0–12.0)
Neutro Abs: 10.2 10*3/uL — ABNORMAL HIGH (ref 1.4–7.7)
Neutrophils Relative %: 72.2 % (ref 43.0–77.0)
Platelets: 327 10*3/uL (ref 150.0–400.0)
RBC: 4.92 Mil/uL (ref 3.87–5.11)
RDW: 14.9 % — ABNORMAL HIGH (ref 11.5–14.6)
WBC: 14.1 10*3/uL — ABNORMAL HIGH (ref 4.5–10.5)

## 2010-06-30 LAB — URINALYSIS, ROUTINE W REFLEX MICROSCOPIC
Bilirubin Urine: NEGATIVE
Hgb urine dipstick: NEGATIVE
Ketones, ur: NEGATIVE
Leukocytes, UA: NEGATIVE
Nitrite: NEGATIVE
Specific Gravity, Urine: 1.01 (ref 1.000–1.030)
Total Protein, Urine: NEGATIVE
Urine Glucose: NEGATIVE
Urobilinogen, UA: 0.2 (ref 0.0–1.0)
pH: 6.5 (ref 5.0–8.0)

## 2010-06-30 LAB — HEPATIC FUNCTION PANEL
Albumin: 3.9 g/dL (ref 3.5–5.2)
Alkaline Phosphatase: 68 U/L (ref 39–117)
Bilirubin, Direct: 0.1 mg/dL (ref 0.0–0.3)
Total Bilirubin: 0.5 mg/dL (ref 0.3–1.2)
Total Protein: 7.4 g/dL (ref 6.0–8.3)

## 2010-06-30 LAB — TSH: TSH: 0.14 u[IU]/mL — ABNORMAL LOW (ref 0.35–5.50)

## 2010-07-09 ENCOUNTER — Other Ambulatory Visit: Payer: 59

## 2010-07-09 ENCOUNTER — Ambulatory Visit (INDEPENDENT_AMBULATORY_CARE_PROVIDER_SITE_OTHER): Payer: 59 | Admitting: Internal Medicine

## 2010-07-09 ENCOUNTER — Encounter: Payer: Self-pay | Admitting: Internal Medicine

## 2010-07-09 ENCOUNTER — Other Ambulatory Visit: Payer: Self-pay | Admitting: Internal Medicine

## 2010-07-09 DIAGNOSIS — E039 Hypothyroidism, unspecified: Secondary | ICD-10-CM | POA: Insufficient documentation

## 2010-07-09 DIAGNOSIS — F17200 Nicotine dependence, unspecified, uncomplicated: Secondary | ICD-10-CM

## 2010-07-09 DIAGNOSIS — E041 Nontoxic single thyroid nodule: Secondary | ICD-10-CM

## 2010-07-09 DIAGNOSIS — D72829 Elevated white blood cell count, unspecified: Secondary | ICD-10-CM

## 2010-07-09 DIAGNOSIS — I10 Essential (primary) hypertension: Secondary | ICD-10-CM

## 2010-07-09 DIAGNOSIS — E0590 Thyrotoxicosis, unspecified without thyrotoxic crisis or storm: Secondary | ICD-10-CM

## 2010-07-09 LAB — CONVERTED CEMR LAB: Thyroperoxidase Ab SerPl-aCnc: 10.5 (ref <35.0)

## 2010-07-09 LAB — T3, FREE: T3, Free: 3.4 pg/mL (ref 2.3–4.2)

## 2010-07-09 LAB — T4, FREE: Free T4: 0.93 ng/dL (ref 0.60–1.60)

## 2010-07-10 NOTE — Assessment & Plan Note (Signed)
Summary: BACK PAIN /NWS   Vital Signs:  Patient profile:   40 year old female Menstrual status:  regular Height:      64 inches Weight:      232 pounds O2 Sat:      99 % on Room air Temp:     98.3 degrees F oral Pulse rate:   84 / minute Pulse rhythm:   regular Resp:     16 per minute BP sitting:   112 / 72  (right arm) Cuff size:   large  Vitals Entered By: Rock Nephew [name] (June 30, 2010 10:33 AM)  O2 Flow:  Room air CC: Patient c/o back pain and sinus congestion, Back pain, URI symptoms  Does patient need assistance? Functional Status Self care Ambulation Normal   Primary Care Provider:  Etta Grandchild MD  CC:  Patient c/o back pain and sinus congestion, Back pain, and URI symptoms.  History of Present Illness:  Back Pain      This is a 40 year old woman who presents with Back pain.  The symptoms began 2 weeks ago.  The intensity is described as moderate-severe.  The patient reports inability to work and inability to care for self, but denies fever, chills, weakness, loss of sensation, fecal incontinence, urinary incontinence, urinary retention, dysuria, and rest pain.  The pain is located in the left low back.  The pain began at home, gradually, and after straining.  The pain radiates to the left hip.  The pain is made worse by flexion and extension.  The pain is made better by acetaminophen and NSAID medications.    URI Symptoms      The patient also presents with URI symptoms.  The symptoms began 2 months ago.  The severity is described as moderate.  The patient reports nasal congestion and dry cough, but denies clear nasal discharge, purulent nasal discharge, sore throat, productive cough, earache, and sick contacts.  Associated symptoms include dyspnea and wheezing.  The patient denies fever, stiff neck, rash, vomiting, diarrhea, use of an antipyretic, and response to antipyretic.  The patient also reports sneezing, seasonal symptoms, and response to antihistamine.   The patient denies headache, muscle aches, and severe fatigue.  The patient denies the following risk factors for Strep sinusitis: unilateral facial pain, unilateral nasal discharge, poor response to decongestant, double sickening, tooth pain, Strep exposure, tender adenopathy, and absence of cough.  She tells me that has been to an [facility] several times with this infection and that a chest xray showed PNA one month ago. She has taken several courses of ? anitbiotics with no relief. She was given steroid injections twice and that helped for a week or two but then the symtpoms returned.  Preventive Screening-Counseling & Management  Alcohol-Tobacco     Alcohol drinks/day: 0     Alcohol Counseling: not indicated; patient does not drink     Smoking Status: current     Smoking Cessation Counseling: yes     Smoke Cessation Stage: precontemplative     Packs/Day: 1.0     Year Started: 1987     Pack years: 25     Tobacco Counseling: to quit use of tobacco products  Hep-HIV-STD-Contraception     Hepatitis Risk: no risk noted     HIV Risk: no risk noted     STD Risk: no risk noted      Sexual History:  currently monogamous.        Drug Use:  never and no.        Blood Transfusions:  no.    Clinical Review Panels:  Prevention   Last Mammogram:  normal (05/04/2005)   Last Pap Smear:  normal (05/04/2008)  Immunizations   Last Tetanus Booster:  Tdap (11/28/2009)  Diabetes Management   Creatinine:  0.8 (11/28/2009)  CBC   WBC:  15.1 (12/02/2009)   RBC:  4.80 (12/02/2009)   Hgb:  14.0 (12/02/2009)   Hct:  42.0 (12/02/2009)   Platelets:  331.0 (12/02/2009)   MCV  87.5 (12/02/2009)   MCHC  33.4 (12/02/2009)   RDW  14.3 (12/02/2009)   PMN:  73.9 (12/02/2009)   Lymphs:  18.5 (12/02/2009)   Monos:  7.4 (12/02/2009)   Eosinophils:  0.0 (12/02/2009)   Basophil:  0.2 (12/02/2009)  Complete Metabolic Panel   Glucose:  85 (11/28/2009)   Sodium:  141 (11/28/2009)   Potassium:  3.6  (11/28/2009)   Chloride:  100 (11/28/2009)   CO2:  26 (11/28/2009)   BUN:  12 (11/28/2009)   Creatinine:  0.8 (11/28/2009)   Albumin:  4.1 (11/28/2009)   Total Protein:  7.7 (11/28/2009)   Calcium:  8.7 (11/28/2009)   Total Bili:  0.6 (11/28/2009)   Alk Phos:  85 (11/28/2009)   SGPT (ALT):  23 (11/28/2009)   SGOT (AST):  18 (11/28/2009)   Medications Prior to Update: 1)  Spironolactone 25 Mg Tabs (Spironolactone) .... One By Mouth Once Daily For High Blood Pressure 2)  Wellbutrin Xl 150 Mg Xr24h-Tab (Bupropion Hcl) .... One By Mouth Once Daily  Current Medications (verified): 1)  Spironolactone 25 Mg Tabs (Spironolactone) .... One By Mouth Once Daily For High Blood Pressure 2)  Wellbutrin Xl 150 Mg Xr24h-Tab (Bupropion Hcl) .... One By Mouth Once Daily 3)  Nucynta Er 50 Mg Xr12h-Tab (Tapentadol Hcl) .... One By Mouth Two Times A Day As Needed For Low Back Pain 4)  Fluticasone Propionate 50 Mcg/act Susp (Fluticasone Propionate) .... 2 Puffs Each Nostril Once Daily 5)  Symbicort 80-4.5 Mcg/act Aero (Budesonide-Formoterol Fumarate) .... 2 Puffs Bid  Allergies (verified): 1)  ! Sulfa  Past History:  Past Medical History: Last updated: 11/28/2009 Depression Headache Hypertension Ovarian cysts Dysmenorrhea  Past Surgical History: Last updated: 11/28/2009 Caesarean section Cholecystectomy Sinus surgery  Social History: Last updated: 11/28/2009 Married Current Smoker Alcohol use-no Drug use-no Regular exercise-yes Occupation: mortgage insurance  Risk Factors: Alcohol Use: 0 (06/30/2010) Exercise: yes (11/28/2009)  Risk Factors: Smoking Status: current (06/30/2010) Packs/Day: 1.0 (06/30/2010)  Family History: Reviewed history from 11/28/2009 and no changes required. Family History of Alcoholism/Addiction Family History of Arthritis Family History Hypertension Family History Kidney disease Family History Lung cancer Family History Other cancer- Intestinal,  Pancreatic and Leukmeia Family History Ovarian cancer Family History Uterine cancer Family History Heart disease Family History Emotional/Mental illness Family History of Colon CA 1st degree relative <60  Social History: Reviewed history from 11/28/2009 and no changes required. Married Current Smoker Alcohol use-no Drug use-no Regular exercise-yes Occupation: mortgage insurance  Review of Systems       The patient complains of weight gain and prolonged cough.  The patient denies anorexia, fever, weight loss, hoarseness, chest pain, syncope, dyspnea on exertion, peripheral edema, headaches, hemoptysis, abdominal pain, melena, hematochezia, severe indigestion/heartburn, suspicious skin lesions, difficulty walking, depression, enlarged lymph nodes, and angioedema.   General:  Denies chills, fatigue, fever, loss of appetite, malaise, sleep disorder, sweats, weakness, and weight loss. ENT:  Complains of nasal congestion and postnasal drainage; denies decreased  hearing, difficulty swallowing, ear discharge, earache, hoarseness, nosebleeds, ringing in ears, sinus pressure, and sore throat. Heme:  Denies abnormal bruising, bleeding, enlarge lymph nodes, fevers, pallor, and skin discoloration.  Physical Exam  General:  alert, well-developed, well-nourished, well-hydrated, appropriate dress, normal appearance, healthy-appearing, cooperative to examination, and good hygiene.   Head:  normocephalic, atraumatic, no abnormalities observed, and no abnormalities palpated.   Eyes:  vision grossly intact, pupils equal, pupils round, pupils reactive to light, and no nystagmus.   Ears:  R ear normal and L ear normal.   Nose:  no external deformity, no nasal discharge, no airflow obstruction, no intranasal foreign body, no nasal polyps, no nasal mucosal lesions, no mucosal friability, no active bleeding or clots, no sinus percussion tenderness, no septum abnormalities, nasal dischargemucosal pallor, and  mucosal edema.   Mouth:  Oral mucosa and oropharynx without lesions or exudates.  Teeth in good repair. Neck:  supple, full ROM, no masses, no thyromegaly, no thyroid nodules or tenderness, normal carotid upstroke, no carotid bruits, no cervical lymphadenopathy, and no neck tenderness.   Lungs:  no accessory muscle use, normal breath sounds, no dullness, no fremitus, no crackles, R wheezes, and L wheezes.   Heart:  normal rate, regular rhythm, no murmur, no gallop, no rub, and no JVD.   Abdomen:  soft, non-tender, normal bowel sounds, no distention, no masses, no guarding, no rigidity, no rebound tenderness, no abdominal hernia, no inguinal hernia, no hepatomegaly, and no splenomegaly.   Msk:  normal ROM, no joint tenderness, no joint swelling, no joint warmth, no redness over joints, no joint deformities, no joint instability, and no crepitation.   Pulses:  R and L carotid,radial,femoral,dorsalis pedis and posterior tibial pulses are full and equal bilaterally Extremities:  No clubbing, cyanosis, edema, or deformity noted with normal full range of motion of all joints.   Neurologic:  No cranial nerve deficits noted. Station and gait are normal. Plantar reflexes are down-going bilaterally. DTRs are symmetrical throughout. Sensory, motor and coordinative functions appear intact. Skin:  turgor normal, color normal, no rashes, no suspicious lesions, no ecchymoses, no petechiae, no purpura, no ulcerations, no edema, and actinic keratosis.   Cervical Nodes:  no anterior cervical adenopathy and no posterior cervical adenopathy.   Axillary Nodes:  no R axillary adenopathy and no L axillary adenopathy.   Inguinal Nodes:  no R inguinal adenopathy and no L inguinal adenopathy.   Psych:  Cognition and judgment appear intact. Alert and cooperative with normal attention span and concentration. No apparent delusions, illusions, hallucinations and subdued.     Detailed Back/Spine Exam  General:    obese.     Gait:    Normal heel-toe gait pattern bilaterally.    Lumbosacral Exam:  Inspection-deformity:    Normal Palpation-spinal tenderness:  Normal Range of Motion:    Forward Flexion:   80 degrees    Hyperextension:   30 degrees    Right Lateral Bend:   25 degrees    Left Lateral Bend:   30 degrees Squatting:  normal Lying Straight Leg Raise:    Right:  negative    Left:  positive Sitting Straight Leg Raise:    Right:  negative    Left:  positive Reverse Straight Leg Raise:    Right:  negative    Left:  negative Contralateral Straight Leg Raise:    Right:  negative    Left:  negative Toe Walking:    Right:  normal    Left:  normal  Heel Walking:    Right:  normal    Left:  normal   Impression & Recommendations:  Problem # 1:  ALLERGIC RHINITIS DUE TO OTHER ALLERGEN (ICD-477.8) Assessment New start flonase ns  Problem # 2:  ASTHMA UNSPECIFIED WITH EXACERBATION (ZOX-096.04) Assessment: New  Her updated medication list for this problem includes:    Symbicort 80-4.5 Mcg/act Aero (Budesonide-formoterol fumarate) .Marland Kitchen... 2 puffs bid  Orders: Tobacco use cessation intermediate 3-10 minutes (54098)  Problem # 3:  COUGH (ICD-786.2) Assessment: New will check for pna, mass, edema Orders: T-2 View CXR (71020TC) Tobacco use cessation intermediate 3-10 minutes (11914)  Problem # 4:  LOW BACK PAIN, ACUTE (ICD-724.2) Assessment: New  Her updated medication list for this problem includes:    Nucynta Er 50 Mg Xr12h-tab (Tapentadol hcl) ..... One by mouth two times a day as needed for low back pain  Orders: T-Lumbar Spine Complete, 5 Views (71110TC) Venipuncture (78295) TLB-BMP (Basic Metabolic Panel-BMET) (80048-METABOL) TLB-CBC Platelet - w/Differential (85025-CBCD) TLB-Hepatic/Liver Function Pnl (80076-HEPATIC) TLB-TSH (Thyroid Stimulating Hormone) (84443-TSH) TLB-Udip w/ Micro (81001-URINE)  Problem # 5:  LEUKOCYTOSIS (ICD-288.60) Assessment:  Unchanged  Orders: Venipuncture (62130) TLB-BMP (Basic Metabolic Panel-BMET) (80048-METABOL) TLB-CBC Platelet - w/Differential (85025-CBCD) TLB-Hepatic/Liver Function Pnl (80076-HEPATIC) TLB-TSH (Thyroid Stimulating Hormone) (84443-TSH) TLB-Udip w/ Micro (81001-URINE)  Problem # 6:  HYPERTENSION (ICD-401.9) Assessment: Improved  Her updated medication list for this problem includes:    Spironolactone 25 Mg Tabs (Spironolactone) ..... One by mouth once daily for high blood pressure  Orders: Venipuncture (86578) TLB-BMP (Basic Metabolic Panel-BMET) (80048-METABOL) TLB-CBC Platelet - w/Differential (85025-CBCD) TLB-Hepatic/Liver Function Pnl (80076-HEPATIC) TLB-TSH (Thyroid Stimulating Hormone) (84443-TSH) TLB-Udip w/ Micro (81001-URINE)  BP today: 112/72 Prior BP: 114/88 (12/02/2009)  Labs Reviewed: K+: 3.6 (11/28/2009) Creat: : 0.8 (11/28/2009)     Complete Medication List: 1)  Spironolactone 25 Mg Tabs (Spironolactone) .... One by mouth once daily for high blood pressure 2)  Wellbutrin Xl 150 Mg Xr24h-tab (Bupropion hcl) .... One by mouth once daily 3)  Nucynta Er 50 Mg Xr12h-tab (Tapentadol hcl) .... One by mouth two times a day as needed for low back pain 4)  Fluticasone Propionate 50 Mcg/act Susp (Fluticasone propionate) .... 2 puffs each nostril once daily 5)  Symbicort 80-4.5 Mcg/act Aero (Budesonide-formoterol fumarate) .... 2 puffs bid  Patient Instructions: 1)  Please schedule a follow-up appointment in 2 weeks. 2)  Tobacco is very bad for your health and your loved ones! You Should stop smoking!. 3)  Stop Smoking Tips: Choose a Quit date. Cut down before the Quit date. decide what you will do as a substitute when you feel the urge to smoke(gum,toothpick,exercise). 4)  It is important that you exercise regularly at least 20 minutes 5 times a week. If you develop chest pain, have severe difficulty breathing, or feel very tired , stop exercising immediately and seek  medical attention. 5)  It is not healthy  for men to drink more than 2-3 drinks per day or for women to drink more than 1-2 drinks per day. 6)  Check your blood sugars regularly. If your readings are usually above : or below 70 you should contact our office. 7)  It is important that your Diabetic A1c level is checked every 3 months. 8)  See your eye doctor yearly to check for diabetic eye damage. 9)  Check your feet each night for sore areas, calluses or signs of infection. 10)  Check your Blood Pressure regularly. If it is above 130/80: you should make an  appointment. 11)  Take 650-1000mg  of Tylenol every 4-6 hours as needed for relief of pain or comfort of fever AVOID taking more than 4000mg   in a 24 hour period (can cause liver damage in higher doses). 12)  Take 400-600mg  of Ibuprofen (Advil, Motrin) with food every 4-6 hours as needed for relief of pain or comfort of fever. 13)  Acute bronchitis symptoms for less than 10 days are not helped by antibiotics. take over the counter cough medications. call if no improvment in  5-7 days, sooner if increasing cough, fever, or new symptoms( shortness of breath, chest pain). 14)  Most patients (90%) with low back pain will improve with time (2-6 weeks). Keep active but avoid activities that are painful. Apply moist heat and/or ice to lower back several times a day. Prescriptions: SYMBICORT 80-4.5 MCG/ACT AERO (BUDESONIDE-FORMOTEROL FUMARATE) 2 puffs BID  #4 inhs x 11   Entered and Authorized by:   Etta Grandchild MD   Signed by:   Etta Grandchild MD on 06/30/2010   Method used:   Samples Given   RxID:   [id number] FLUTICASONE PROPIONATE 50 MCG/ACT SUSP (FLUTICASONE PROPIONATE) 2 puffs each nostril once daily  #1 inh x 11   Entered and Authorized by:   Etta Grandchild MD   Signed by:   Etta Grandchild MD on 06/30/2010   Method used:   Electronically to        [facility]  [phone]* (retail)       [address]        [place], [place]  [address]       Ph: [phone] or [phone]       Fax: [phone]   RxID:   [id number] NUCYNTA ER 50 MG XR12H-TAB (TAPENTADOL HCL) One by mouth two times a day as needed for low back pain  #60 x 0   Entered and Authorized by:   Etta Grandchild MD   Signed by:   Etta Grandchild MD on 06/30/2010   Method used:   Print then Give to Patient   RxID:   [id number]    Orders Added: 1)  T-Lumbar Spine Complete, 5 Views [71110TC] 2)  T-2 View CXR [71020TC] 3)  Venipuncture [10932] 4)  TLB-BMP (Basic Metabolic Panel-BMET) [80048-METABOL] 5)  TLB-CBC Platelet - w/Differential [85025-CBCD] 6)  TLB-Hepatic/Liver Function Pnl [80076-HEPATIC] 7)  TLB-TSH (Thyroid Stimulating Hormone) [84443-TSH] 8)  TLB-Udip w/ Micro [81001-URINE] 9)  Tobacco use cessation intermediate 3-10 minutes [99406] 10)  Est. Patient Level V [35573]

## 2010-07-10 NOTE — Letter (Signed)
Summary: Results Follow-up Letter  [facility]  [address] [place], [place] [address]   Phone: [phone]  Fax: [phone]    06/30/2010  [address] [place], [place]  [address]  [place]  Dear Holly Mcmillan,   The following are the results of your recent test(s):  Test     Result     CBC       WBC is still elevated Thyroid     slightly overactive Liver/kidney   normal Urine       normal Chest xray     bronchitis back xray     arthritis   _________________________________________________________  Please call for an appointment soon _________________________________________________________ _________________________________________________________ _________________________________________________________  Sincerely,  Sanda Linger MD [facility]

## 2010-07-14 ENCOUNTER — Telehealth: Payer: Self-pay | Admitting: Internal Medicine

## 2010-07-14 ENCOUNTER — Ambulatory Visit
Admission: RE | Admit: 2010-07-14 | Discharge: 2010-07-14 | Disposition: A | Discharge status: Home / Self Care | Payer: 59 | Source: Ambulatory Visit | Attending: Internal Medicine | Admitting: Internal Medicine

## 2010-07-14 ENCOUNTER — Ambulatory Visit: Payer: 59 | Admitting: Internal Medicine

## 2010-07-14 DIAGNOSIS — E041 Nontoxic single thyroid nodule: Secondary | ICD-10-CM

## 2010-07-15 NOTE — Assessment & Plan Note (Signed)
Summary: 2 WK FU Natale Milch #   Vital Signs:  Patient profile:   40 year old female Menstrual status:  regular Height:      64 inches Weight:      235 pounds BMI:     40.48 O2 Sat:      98 % on Room air Temp:     98.3 degrees F oral Pulse rate:   80 / minute Pulse rhythm:   regular Resp:     16 per minute BP sitting:   132 / 90  (left arm) Cuff size:   large  Vitals Entered By: Lanier Prude, Beverly Gust) (July 09, 2010 8:32 AM)  Nutrition Counseling: Patient's BMI is greater than 25 and therefore counseled on weight management options.  O2 Flow:  Room air CC: 2 wk f/u  Is Patient Diabetic? No   Primary Care Provider:  Etta Grandchild MD  CC:  2 wk f/u .  History of Present Illness:  Follow-Up Visit      This is a 40 year old woman who presents for Follow-up visit.  The patient denies chest pain, palpitations, dizziness, syncope, edema, SOB, DOE, PND, and orthopnea.  Since the last visit the patient notes no new problems or concerns.  The patient reports taking meds as prescribed and monitoring BP.  When questioned about possible medication side effects, the patient notes none.    She tells me that several years ago she had a thyroid nodule that was biopsied by an ENT doctor (Dr. Baldo Ash) and that the results were benign. She does not fell a nodule in her neck and she has no neck pain or swelling.  Preventive Screening-Counseling & Management  Alcohol-Tobacco     Alcohol drinks/day: 0     Alcohol Counseling: not indicated; patient does not drink     Smoking Status: current     Smoking Cessation Counseling: yes     Smoke Cessation Stage: precontemplative     Packs/Day: 1.0     Year Started: 1987     Pack years: 25     Tobacco Counseling: to quit use of tobacco products  Hep-HIV-STD-Contraception     Hepatitis Risk: no risk noted     HIV Risk: no risk noted     STD Risk: no risk noted  Clinical Review Panels:  Prevention   Last Mammogram:  normal (05/04/2005)   Last  Pap Smear:  normal (05/04/2008)  Immunizations   Last Tetanus Booster:  Tdap (11/28/2009)  Diabetes Management   Creatinine:  0.7 (06/30/2010)  CBC   WBC:  14.1 (06/30/2010)   RBC:  4.92 (06/30/2010)   Hgb:  14.7 (06/30/2010)   Hct:  42.8 (06/30/2010)   Platelets:  327.0 (06/30/2010)   MCV  87.2 (06/30/2010)   MCHC  34.4 (06/30/2010)   RDW  14.9 (06/30/2010)   PMN:  72.2 (06/30/2010)   Lymphs:  20.9 (06/30/2010)   Monos:  6.6 (06/30/2010)   Eosinophils:  0.2 (06/30/2010)   Basophil:  0.1 (06/30/2010)  Complete Metabolic Panel   Glucose:  68 (06/30/2010)   Sodium:  139 (06/30/2010)   Potassium:  4.2 (06/30/2010)   Chloride:  103 (06/30/2010)   CO2:  28 (06/30/2010)   BUN:  10 (06/30/2010)   Creatinine:  0.7 (06/30/2010)   Albumin:  3.9 (06/30/2010)   Total Protein:  7.4 (06/30/2010)   Calcium:  9.1 (06/30/2010)   Total Bili:  0.5 (06/30/2010)   Alk Phos:  68 (06/30/2010)   SGPT (ALT):  23 (06/30/2010)   SGOT (AST):  17 (06/30/2010)   Medications Prior to Update: 1)  Spironolactone 25 Mg Tabs (Spironolactone) .... One By Mouth Once Daily For High Blood Pressure 2)  Wellbutrin Xl 150 Mg Xr24h-Tab (Bupropion Hcl) .... One By Mouth Once Daily 3)  Nucynta Er 50 Mg Xr12h-Tab (Tapentadol Hcl) .... One By Mouth Two Times A Day As Needed For Low Back Pain 4)  Fluticasone Propionate 50 Mcg/act Susp (Fluticasone Propionate) .... 2 Puffs Each Nostril Once Daily 5)  Symbicort 80-4.5 Mcg/act Aero (Budesonide-Formoterol Fumarate) .... 2 Puffs Bid  Current Medications (verified): 1)  Spironolactone 25 Mg Tabs (Spironolactone) .... One By Mouth Once Daily For High Blood Pressure 2)  Wellbutrin Xl 150 Mg Xr24h-Tab (Bupropion Hcl) .... One By Mouth Once Daily 3)  Nucynta Er 50 Mg Xr12h-Tab (Tapentadol Hcl) .... One By Mouth Two Times A Day As Needed For Low Back Pain 4)  Fluticasone Propionate 50 Mcg/act Susp (Fluticasone Propionate) .... 2 Puffs Each Nostril Once Daily 5)  Symbicort  80-4.5 Mcg/act Aero (Budesonide-Formoterol Fumarate) .... 2 Puffs Bid  Allergies (verified): 1)  ! Sulfa  Past History:  Past Medical History: Last updated: 11/28/2009 Depression Headache Hypertension Ovarian cysts Dysmenorrhea  Past Surgical History: Last updated: 11/28/2009 Caesarean section Cholecystectomy Sinus surgery  Family History: Last updated: 11/28/2009 Family History of Alcoholism/Addiction Family History of Arthritis Family History Hypertension Family History Kidney disease Family History Lung cancer Family History Other cancer- Intestinal, Pancreatic and Leukmeia Family History Ovarian cancer Family History Uterine cancer Family History Heart disease Family History Emotional/Mental illness Family History of Colon CA 1st degree relative <60  Social History: Last updated: 11/28/2009 Married Current Smoker Alcohol use-no Drug use-no Regular exercise-yes Occupation: mortgage insurance  Risk Factors: Alcohol Use: 0 (07/09/2010) Exercise: yes (11/28/2009)  Risk Factors: Smoking Status: current (07/09/2010) Packs/Day: 1.0 (07/09/2010)  Family History: Reviewed history from 11/28/2009 and no changes required. Family History of Alcoholism/Addiction Family History of Arthritis Family History Hypertension Family History Kidney disease Family History Lung cancer Family History Other cancer- Intestinal, Pancreatic and Leukmeia Family History Ovarian cancer Family History Uterine cancer Family History Heart disease Family History Emotional/Mental illness Family History of Colon CA 1st degree relative <60  Social History: Reviewed history from 11/28/2009 and no changes required. Married Current Smoker Alcohol use-no Drug use-no Regular exercise-yes Occupation: Mudlogger  Review of Systems  The patient denies anorexia, fever, weight loss, weight gain, chest pain, syncope, dyspnea on exertion, peripheral edema, prolonged cough,  headaches, hemoptysis, abdominal pain, hematuria, suspicious skin lesions, transient blindness, difficulty walking, depression, enlarged lymph nodes, and angioedema.   General:  Denies chills, fatigue, fever, loss of appetite, malaise, sleep disorder, sweats, weakness, and weight loss. Endo:  Denies cold intolerance, excessive hunger, excessive thirst, excessive urination, heat intolerance, polyuria, and weight change.  Physical Exam  General:  alert, well-developed, well-nourished, well-hydrated, appropriate dress, normal appearance, healthy-appearing, cooperative to examination, good hygiene, and overweight-appearing.   Head:  normocephalic, atraumatic, no abnormalities observed, and no abnormalities palpated.   Eyes:  No corneal or conjunctival inflammation noted. EOMI. Perrla. Funduscopic exam benign, without hemorrhages, exudates or papilledema. Vision grossly normal. Ears:  External ear exam shows no significant lesions or deformities.  Otoscopic examination reveals clear canals, tympanic membranes are intact bilaterally without bulging, retraction, inflammation or discharge. Hearing is grossly normal bilaterally. Mouth:  Oral mucosa and oropharynx without lesions or exudates.  Teeth in good repair. Neck:  supple, full ROM, no masses, no thyromegaly, no thyroid nodules  or tenderness, normal carotid upstroke, no carotid bruits, no cervical lymphadenopathy, and no neck tenderness.   Lungs:  no accessory muscle use, normal breath sounds, no dullness, no fremitus, no crackles, R wheezes, and L wheezes.   Heart:  normal rate, regular rhythm, no murmur, no gallop, no rub, and no JVD.   Abdomen:  soft, non-tender, normal bowel sounds, no distention, no masses, no guarding, no rigidity, no rebound tenderness, no abdominal hernia, no inguinal hernia, no hepatomegaly, and no splenomegaly.   Msk:  No deformity or scoliosis noted of thoracic or lumbar spine.   Pulses:  R and L  carotid,radial,femoral,dorsalis pedis and posterior tibial pulses are full and equal bilaterally Extremities:  No clubbing, cyanosis, edema, or deformity noted with normal full range of motion of all joints.   Neurologic:  No cranial nerve deficits noted. Station and gait are normal. Plantar reflexes are down-going bilaterally. DTRs are symmetrical throughout. Sensory, motor and coordinative functions appear intact. Skin:  Intact without suspicious lesions or rashes Cervical Nodes:  No lymphadenopathy noted Axillary Nodes:  No palpable lymphadenopathy Psych:  Cognition and judgment appear intact. Alert and cooperative with normal attention span and concentration. No apparent delusions, illusions, hallucinations   Impression & Recommendations:  Problem # 1:  HYPERTHYROIDISM (ICD-242.90) Assessment New  Orders: TLB-T3, Free (Triiodothyronine) ([id number]) TLB-T4 (Thyrox), Free [phone]) T- * Misc. Laboratory test [phone])  Problem # 2:  THYROID NODULE (ICD-241.0) Assessment: New  Orders: T- * Misc. 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 One by mouth once daily for high blood pressure  BP today: 132/90 Prior BP: 112/72 (06/30/2010)  Labs Reviewed: K+: 4.2 (06/30/2010) Creat: : 0.7 (06/30/2010)     Problem # 5:  TOBACCO USE (ICD-305.1) Assessment: Unchanged  Encouraged smoking cessation and discussed different methods for smoking cessation.   Complete Medication List: 1)  Spironolactone 25 Mg Tabs (Spironolactone) .... One by mouth once daily for high blood pressure 2)  Wellbutrin Xl 150 Mg Xr24h-tab (Bupropion hcl) .... One by mouth once daily 3)  Nucynta Er 50 Mg Xr12h-tab (Tapentadol hcl) .... One by mouth two times a  day as needed for low back pain 4)  Fluticasone Propionate 50 Mcg/act Susp (Fluticasone propionate) .... 2 puffs each nostril once daily 5)  Symbicort 80-4.5 Mcg/act Aero (Budesonide-formoterol fumarate) .... 2 puffs bid  Patient Instructions: 1)  Please schedule a follow-up appointment in 2 months. 2)  Tobacco is very bad for your health and your loved ones! You Should stop smoking!. 3)  Stop Smoking Tips: Choose a Quit date. Cut down before the Quit date. decide what you will do as a substitute when you feel the urge to smoke(gum,toothpick,exercise). 4)  It is important that you exercise regularly at least 20 minutes 5 times a week. If you develop chest pain, have severe difficulty breathing, or feel very tired , stop exercising immediately and seek medical attention. 5)  You need to lose weight. Consider a lower calorie diet and regular exercise.  Prescriptions: SPIRONOLACTONE 25 MG TABS (SPIRONOLACTONE) One by mouth once daily for high blood pressure  #30 x 0   Entered by:   Ami Bullins [name]   Authorized by:   Etta Grandchild MD   Signed by:   Bill Salinas [name] on 07/09/2010   Method  used:   Print then Give to Patient   RxID:   [id number] SPIRONOLACTONE 25 MG TABS (SPIRONOLACTONE) One by mouth once daily for high blood pressure  #90 x 3   Entered by:   Ami Bullins [name]   Authorized by:   Etta Grandchild MD   Signed by:   Bill Salinas [name] on 07/09/2010   Method used:   Print then Give to Patient   RxID:   [id number]    Orders Added: 1)  TLB-T3, Free (Triiodothyronine) [[id number]] 2)  TLB-T4 (Thyrox), Free [[id number]] 3)  T- * Misc. Laboratory test [phone] 4)  Radiology Referral [Radiology] 5)  Est. Patient Level IV [79892]

## 2010-08-18 ENCOUNTER — Other Ambulatory Visit (HOSPITAL_COMMUNITY): Payer: Self-pay | Admitting: Endocrinology

## 2010-08-18 DIAGNOSIS — E042 Nontoxic multinodular goiter: Secondary | ICD-10-CM

## 2010-08-20 ENCOUNTER — Encounter (HOSPITAL_COMMUNITY)
Admission: RE | Admit: 2010-08-20 | Discharge: 2010-08-20 | Disposition: A | Discharge status: Home / Self Care | Payer: 59 | Source: Ambulatory Visit | Attending: Endocrinology | Admitting: Endocrinology

## 2010-08-20 DIAGNOSIS — E042 Nontoxic multinodular goiter: Secondary | ICD-10-CM

## 2010-08-21 ENCOUNTER — Encounter (HOSPITAL_COMMUNITY)
Admission: RE | Admit: 2010-08-21 | Discharge: 2010-08-21 | Disposition: A | Discharge status: Home / Self Care | Payer: 59 | Source: Ambulatory Visit | Attending: Endocrinology | Admitting: Endocrinology

## 2010-08-21 MED ORDER — SODIUM PERTECHNETATE TC 99M INJECTION
10.4000 | Freq: Once | INTRAVENOUS | Status: AC | PRN
  Administered 2010-08-21: 10 via INTRAVENOUS

## 2010-08-21 MED ORDER — SODIUM IODIDE I 131 CAPSULE
5.2000 | Freq: Once | INTRAVENOUS | Status: AC | PRN
  Administered 2010-08-21: 5.2 via ORAL

## 2010-09-08 ENCOUNTER — Encounter: Payer: Self-pay | Admitting: Internal Medicine

## 2010-09-10 ENCOUNTER — Encounter: Payer: Self-pay | Admitting: Internal Medicine

## 2010-09-10 ENCOUNTER — Ambulatory Visit (INDEPENDENT_AMBULATORY_CARE_PROVIDER_SITE_OTHER): Payer: 59 | Admitting: Internal Medicine

## 2010-09-10 DIAGNOSIS — J45901 Unspecified asthma with (acute) exacerbation: Secondary | ICD-10-CM

## 2010-09-10 DIAGNOSIS — E041 Nontoxic single thyroid nodule: Secondary | ICD-10-CM

## 2010-09-10 DIAGNOSIS — F329 Major depressive disorder, single episode, unspecified: Secondary | ICD-10-CM

## 2010-09-10 DIAGNOSIS — F3289 Other specified depressive episodes: Secondary | ICD-10-CM

## 2010-09-10 DIAGNOSIS — I10 Essential (primary) hypertension: Secondary | ICD-10-CM

## 2010-09-10 DIAGNOSIS — M545 Low back pain, unspecified: Secondary | ICD-10-CM

## 2010-09-10 NOTE — Assessment & Plan Note (Signed)
She has stopped taking wellbutrin and is doing well

## 2010-09-10 NOTE — Assessment & Plan Note (Signed)
This has resolved.

## 2010-09-10 NOTE — Progress Notes (Signed)
Subjective:    Patient ID: Holly Mcmillan, female    DOB: [birth year], 40 y.o.   MRN: [id number]  Thyroid Problem Presents for follow-up visit. Patient reports no anxiety, cold intolerance, constipation, depressed mood, diaphoresis, diarrhea, dry skin, fatigue, hair loss, heat intolerance, hoarse voice, leg swelling, menstrual problem, nail problem, palpitations, tremors, visual change, weight gain or weight loss. The symptoms have been stable.  Hypertension This is a chronic problem. The current episode started more than 1 year ago. The problem has been rapidly improving since onset. The problem is controlled. Pertinent negatives include no anxiety, blurred vision, chest pain, headaches, malaise/fatigue, neck pain, orthopnea, palpitations, peripheral edema, PND, shortness of breath or sweats. There are no associated agents to hypertension. Risk factors for coronary artery disease include obesity. Past treatments include diuretics. The current treatment provides significant improvement. There are no compliance problems.  Hypertensive end-organ damage includes a thyroid problem.      Review of Systems  Constitutional: Negative for fever, chills, weight loss, weight gain, malaise/fatigue, diaphoresis, activity change, appetite change, fatigue and unexpected weight change.  HENT: Negative for sore throat, hoarse voice, facial swelling, drooling, mouth sores, trouble swallowing, neck pain, neck stiffness and voice change.   Eyes: Negative for blurred vision, photophobia and visual disturbance.  Respiratory: Negative for apnea, cough, choking, chest tightness, shortness of breath, wheezing and stridor.   Cardiovascular: Negative for chest pain, palpitations, orthopnea, leg swelling and PND.  Gastrointestinal: Negative for nausea, vomiting, abdominal pain, diarrhea and constipation.  Genitourinary: Negative for dysuria, urgency, frequency, hematuria, decreased urine volume, enuresis, difficulty urinating,  menstrual problem and dyspareunia.  Musculoskeletal: Negative for myalgias, back pain, joint swelling, arthralgias and gait problem.  Skin: Negative for color change, pallor, rash and wound.  Neurological: Negative for dizziness, tremors, seizures, syncope, facial asymmetry, speech difficulty, weakness, light-headedness, numbness and headaches.  Hematological: Negative for cold intolerance, heat intolerance and adenopathy. Does not bruise/bleed easily.  Psychiatric/Behavioral: Negative for suicidal ideas, hallucinations, behavioral problems, confusion, sleep disturbance, self-injury, dysphoric mood, decreased concentration and agitation. The patient is not nervous/anxious and is not hyperactive.        Objective:   Physical Exam  Vitals reviewed. Constitutional: She is oriented to person, place, and time. She appears well-developed and well-nourished. No distress.  HENT:  Head: Normocephalic and atraumatic.  Right Ear: External ear normal.  Left Ear: External ear normal.  Nose: Nose normal.  Mouth/Throat: Oropharynx is clear and moist. No oropharyngeal exudate.  Eyes: Conjunctivae and EOM are normal. Pupils are equal, round, and reactive to light. Right eye exhibits no discharge. Left eye exhibits no discharge. No scleral icterus.  Neck: Normal range of motion. Neck supple. No JVD present. No tracheal deviation present. No thyromegaly present.  Cardiovascular: Normal rate, regular rhythm, normal heart sounds and intact distal pulses.  Exam reveals no gallop and no friction rub.   No murmur heard. Pulmonary/Chest: Effort normal and breath sounds normal. No stridor. No respiratory distress. She has no wheezes. She has no rales. She exhibits no tenderness.  Abdominal: Soft. Bowel sounds are normal. She exhibits no distension and no mass. There is no tenderness. There is no rebound and no guarding.  Musculoskeletal: Normal range of motion. She exhibits no edema and no tenderness.    Lymphadenopathy:    She has no cervical adenopathy.  Neurological: She is alert and oriented to person, place, and time. She has normal reflexes. She displays normal reflexes. No cranial nerve deficit. She exhibits normal muscle tone. Coordination  normal.  Skin: Skin is warm and dry. No rash noted. She is not diaphoretic. No erythema. No pallor.  Psychiatric: She has a normal mood and affect. Her behavior is normal. Judgment and thought content normal.        Lab Results  Component Value Date   WBC 14.1* 06/30/2010   HGB 14.7 06/30/2010   HCT 42.8 06/30/2010   PLT 327.0 06/30/2010   ALT 23 06/30/2010   AST 17 06/30/2010   NA 139 06/30/2010   K 4.2 06/30/2010   CL 103 06/30/2010   CREATININE 0.7 06/30/2010   BUN 10 06/30/2010   CO2 28 06/30/2010   TSH 0.14* 06/30/2010    Assessment & Plan:

## 2010-09-10 NOTE — Assessment & Plan Note (Signed)
This is going well for her

## 2010-09-10 NOTE — Assessment & Plan Note (Signed)
She has seen Dr. Jenne Pane (ENT) and Dr. Talmage Nap (ENDO) and she has a cold nodule in her thyroid gland, she is scheduled for thyroidectomy.

## 2010-09-10 NOTE — Assessment & Plan Note (Signed)
Her BP is well controlled 

## 2010-09-12 ENCOUNTER — Other Ambulatory Visit: Payer: Self-pay | Admitting: Obstetrics & Gynecology

## 2010-09-12 DIAGNOSIS — R928 Other abnormal and inconclusive findings on diagnostic imaging of breast: Secondary | ICD-10-CM

## 2010-09-16 NOTE — H&P (Signed)
NAMEOLIVER, NEUWIRTH NO.:  [id number]   MEDICAL RECORD NO.:  [id number]          PATIENT TYPE:  INP   LOCATION:  9171                          FACILITY:  [facility]   PHYSICIAN:  Freddy Finner, M.D.   DATE OF BIRTH:  1971/02/09   DATE OF ADMISSION:  03/05/2008  DATE OF DISCHARGE:  03/03/2008                              HISTORY & PHYSICAL   ADMISSION DIAGNOSIS:  Intrauterine pregnancy 36-6/[redacted] weeks gestation.  Previous cesarean delivery, in early labor.   The patient is a 40 year old gravida 4 para 1, AB 2 who has been  followed through prenatal course remarkable for hypertension which has  been adequately treated with labetalol b.i.d. and presents today for  evaluation in the office which included a nonstress test which picked up  regular and moderately painful contractions at 4-6 minute intervals.  Oral Terbutaline was not successful in suppressing the contractions.  For that reason, she was sent to the hospital for delivery.   Past medical history and family history recorded in the prenatal summary  will not be repeated.   PHYSICAL EXAMINATION:  HEENT:  Grossly within normal limits.  Thyroid  gland is not palpably enlarged.  Blood pressure in the office 124/74.  CHEST:  Clear to auscultation.  HEART:  Normal sinus rhythm without murmurs, rubs or gallops.  ABDOMEN:  Obese and gravid.  Estimated fetal weight of at least 7-1/2  pounds.  Cervix is long, closed and firm.  EXTREMITIES:  Edema +2 without cyanosis or clubbing.   ASSESSMENT:  Intrauterine pregnancy 36-6/[redacted] weeks gestation in labor with  previous cesarean delivery.   PLAN:  Repeat cesarean delivery.      Freddy Finner, M.D.  Electronically Signed     WRN/MEDQ  D:  03/05/2008  T:  03/05/2008  Job:  [id number]

## 2010-09-16 NOTE — Op Note (Signed)
NAMEMarland Kitchen  Holly Mcmillan, Holly Mcmillan NO.:  [id number]   MEDICAL RECORD NO.:  [id number]          PATIENT TYPE:  MAT   LOCATION:  [facility]                          FACILITY:  [facility]   PHYSICIAN:  Freddy Finner, M.D.   DATE OF BIRTH:  Jul 25, 1970   DATE OF PROCEDURE:  03/05/2008  DATE OF DISCHARGE:  03/03/2008                               OPERATIVE REPORT   PREOPERATIVE DIAGNOSES:  1. Intrauterine pregnancy at 36-6/[redacted] weeks gestation in early labor.  2. Surgically scarred uterus from previous cesarean delivery.  3. Request for voluntary sterilization.   POSTOPERATIVE DIAGNOSES:  1. Intrauterine pregnancy at 36-6/[redacted] weeks gestation in early labor.  2. Surgically scarred uterus from previous cesarean delivery.  3. Request for voluntary sterilization.  4. 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   OPERATIVE PROCEDURE:  Repeat transverse cervical cesarean section.  Delivery of viable female infant, Apgars of 7 and 9 and arterial cord pH  pending at the time of this dictation.   SECONDARY PROCEDURE:  Bilateral tubal ligation, modified Pomeroy  technique.   SURGEON:  Freddy Finner, M.D.   ASSISTANT:  Stann Mainland. Grewal, M.D.   ESTIMATED INTRAOPERATIVE BLOOD LOSS:  Less than or equal to 800 mL.   INTRAOPERATIVE COMPLICATIONS:  None.   The patient presented to the office for a routine exam on the day of  admission and was found on nonstress test to be in early labor.  She was  admitted now for delivery.  She was monitored in the labor and delivery  area from approximately 2 p.m. until the time of her surgery.  Her  contractions had abated, but she had been in over the weekend with  contractions and sent home.  Given the gestational age of almost 37  weeks, it was felt appropriate to proceed with delivery because of the  current nature of her contractions.  She was brought to the operating  room after  receiving a gram of Ancef IV.  She was placed under adequate  spinal anesthesia, placed in the dorsal recumbent position with  elevation of the right hip by approximately 15 degrees.  The abdomen was  prepped and draped in the usual fashion and a Foley catheter placed  using sterile technique.  A lower abdominal transverse incision was made  through an old scar and carried sharply down to fascia which was entered  sharply and extended through the extent of the skin incision.  Rectus  sheath was developed superiorly and inferiorly with blunt and sharp  dissection.  The rectus muscles were divided in the midline.  Peritoneum  was entered sharply and extended bluntly to the extent of the skin  incision.  A bladder blade was placed.  The bladder was well down on the  lower uterine segment, and it was elected to make an incision  approximately 1-cm above the bladder reflection.  This was done and the  abdomen entered.  Fluid was clear.  The incision was extended bluntly  in  a transverse direction.  A viable female infant was then delivered  without difficulty.  The cord was ahead of the infant's head and there  was single loop of residual nuchal cord which was reduced immediately  after delivering the head.  Apgars were noted above.  Cord blood was  obtained for routine venous sampling and for arterial sampling.  Placenta and other parts of conception were removed and submitted for  routine exam to the labor and delivery area.  The uterus was delivered  onto the anterior abdominal wall.  The cavity was confirmed to be empty  by manual exploration.  Tubes and ovaries were found to be normal.  There was the fibroid noted above.  No other fibroids were noted.  The  uterine incision was closed in a single layer with running locking 0  Monocryl.  Hemostasis was complete.  The isthmic portion of each tube  was elevated, doubly ligated with 0 plain ties and a segment of tube  excised.  The distal ends  of the fallopian tube distal to the tie on  each side were fulgurated with the bovie.  Hemostasis was noted to be  complete.  The uterus was delivered back into the abdominal cavity.  Again, hemostasis in the lower incision was confirmed.  Irrigation was  carried out.  All pack, needle and instrument counts were correct.  The  abdominal incision was then closed in layers.  Running 0 Monocryl was  used to close the peritoneum and reapproximate the rectus muscles.  The  fascia was closed with a looped 0 PDS running from angle to angle on  either side.  The subcutaneous tissue was approximated with 2-0 plain.  The skin was closed with wide skin staples.  The patient tolerated the  operative procedure well and was taken to the recovery room in good  condition.      Freddy Finner, M.D.  Electronically Signed     WRN/MEDQ  D:  03/05/2008  T:  03/05/2008  Job:  [id number]

## 2010-09-18 ENCOUNTER — Ambulatory Visit
Admission: RE | Admit: 2010-09-18 | Discharge: 2010-09-18 | Disposition: A | Discharge status: Home / Self Care | Payer: 59 | Source: Ambulatory Visit | Attending: Obstetrics & Gynecology | Admitting: Obstetrics & Gynecology

## 2010-09-18 DIAGNOSIS — R928 Other abnormal and inconclusive findings on diagnostic imaging of breast: Secondary | ICD-10-CM

## 2010-09-19 NOTE — Op Note (Signed)
NAME:  Holly Mcmillan, Holly Mcmillan                           ACCOUNT NO.:  [id number]   MEDICAL RECORD NO.:  [id number]                   PATIENT TYPE:  OBV   LOCATION:  0477                                 FACILITY:  [facility]   PHYSICIAN:  Currie Paris, M.D.           DATE OF BIRTH:  04-14-1971   DATE OF PROCEDURE:  09/07/2002  DATE OF DISCHARGE:                                 OPERATIVE REPORT   PREOPERATIVE DIAGNOSES:  Biliary colic and biliary dyskinesia.   POSTOPERATIVE DIAGNOSES:  Biliary colic and biliary dyskinesia. Infarcted  omentum epiploica.   OPERATION:  Laparoscopic cholecystectomy with operative cholangiogram,  resection of omentum epiploica.   SURGEON:  Currie Paris, M.D.   ASSISTANT:  Leonie Man, M.D.   ANESTHESIA:  General endotracheal.   CLINICAL HISTORY:  This patient is a 40 year old presenting to Dr. Andrey Campanile  and then to the emergency room with about a three day history of right upper  quadrant pain which was waxing and waning. She had a similar episode and had  been worked up for gallstones about two years ago but was a negative workup.  She had some intermittent episodes similar to this but they were very mild  but this one has been persistent. Gallbladder ultrasound had been negative  but a hepatobiliary scan showed markedly delayed emptying of her  gallbladder. She elected to proceed to laparoscopic cholecystectomy.   DESCRIPTION OF PROCEDURE:  The patient seen in the holding area and had no  further questions. She was taken to the operating room and after  satisfactory general endotracheal anesthesia had been obtained, the abdomen  was prepped and draped. 0.25% plain Marcaine was used for each of the  incisions. The umbilical incision was made, the fascia opened and the  peritoneal cavity entered. A pursestring was placed and the Hasson  introduced. The abdomen was insufflated to 15. Three additional cannulas  were placed in the usual position and  the patient placed in reverse  Trendelenburg. No gross abnormalities were noted initially within the  remainder of the abdominal cavity but in the right upper quadrant we did  notice that the gallbladder was very tense and distended. In addition, there  was what appeared to be a small perhaps 1 cm area of infarcted omentum  epiploica laying right on the gallbladder. The gallbladder itself was not  inflamed.   I picked up the small piece of omentum and put a clip under it and divided  it, took it out just to be sure there was nothing else abnormal with this  and in case this happened to be contributed to some of her symptoms.   The gallbladder retracted, I opened the peritoneum over the triangle of  Calot and identified the cystic artery on the anterior side as well as the  cystic duct. I opened up the peritoneum posteriorly for several centimeters  to make a nice window and I  put a clip on the cystic artery and one on the  cystic duct.   The cystic duct was opened and a Cook catheter placed percutaneously,  threaded into the cystic duct and held with the clip. Operative  cholangiography basically looked normal. Although there was a question of an  air bubble, I did not think there was any stone within the common duct.   The catheter was removed and two clips were placed in the stay side of the  cystic duct and it was divided. Two additional clips were placed on the  cystic artery and it was divided leaving two clips on the functional side.   The gallbladder was removed from below to above. We did notice that there  was a small perhaps 1 cm split in the capsule of the liver just adjacent to  the epigastric trocar. This may have occurred during traction of the  gallbladder over the liver but was not actively bleeding. I did leave some  Surgicel in place and at the end placed a drain just in case there was any  leak from this but this was a very small area.   The gallbladder was  removed about 2/3 of the way up and then we stopped  irrigating and made sure everything was dry and then finished removing the  gallbladder. A final check for hemostasis in the bed of the gallbladder was  made and again everything appeared to be dry. The gallbladder was brought  out the umbilical port. The Hasson was reintroduced and a drain placed along  the bed of the gallbladder and along the edge of the liver to make sure  there was no bleeding or bile leak that occurred and then brought out the  lateral port and sutured with a 2-0 nylon.   The lateral port was removed under direct vision and there was no bleeding.  The umbilical port was removed and the pursestring tied down. The camera was  removed and the abdomen deflated through the epigastric port. That was  removed. The skin was closed with 4-0 Monocryl subcuticular plus Steri-  Strips. The patient tolerated the procedure well. There were no operative  complications, all counts were correct.                                               Currie Paris, M.D.    CJS/MEDQ  D:  09/07/2002  T:  09/07/2002  Job:  [id number]   cc:   Vale Haven. Andrey Campanile, M.D.  [address]  [place]  [place] [address]  Fax: [phone]

## 2010-09-19 NOTE — Discharge Summary (Signed)
[facility]  Patient:    Holly Mcmillan, Holly Mcmillan                        MRN: [id number] Adm. Date:  09/23/00 Disc. Date: 09/28/00 Attending:  Minette Headland Dictator:   Danie Chandler, R.N.                           Discharge Summary  ADMISSION DIAGNOSES:          1. Intrauterine pregnancy at [redacted] weeks gestation.                               2. Breech presentation.                               3. Labor.                               4. Prenatal complications of preterm labor                                  controlled with terbutaline until                                  approximately two days prior to admission.                               5. Mild late onset gestational hypertension                                  with normal pregnancy induced hypertension                                  panel.  DISCHARGE DIAGNOSES:          1. Intrauterine pregnancy at [redacted] weeks gestation.                               2. Breech presentation.                               3. Labor.                               4. Prenatal complications of preterm labor                                  controlled with terbutaline until                                  approximately two days prior to admission.  5. Mild late onset gestational hypertension                                  with normal pregnancy induced hypertension                                  panel.  PROCEDURE:                    On Sep 24, 2000, primary low transverse cervical cesarean section.  REASON FOR ADMISSION:         The patient is a 40 year old who was admitted in labor with a breech presentation which was confirmed by ultrasound in triage.  HOSPITAL COURSE:              The patient was taken to the operating room and underwent the above named procedure.  This was productive of a viable female infant with Apgars of 8 at one minute and 9 at five minutes.  The baby did  go to NICU.  There was an intraoperative complication of laceration of the ileum which was repaired.  On postoperative day #1, the patient was ambulating well and had good return of bowel function.  Her hemoglobin on this day was 10.5, hematocrit 31.0 and white blood cell count 15.1.  On postoperative day #2, the patient had good control of pain and on postoperative day #3, she was tolerating a regular diet.  She was discharged home on postoperative day #4.  CONDITION ON DISCHARGE:       Good.  DIET:                         Regular as tolerated.  ACTIVITY:                     No heavy lifting, no driving, no vaginal entry.  FOLLOW-UP:                    She is to follow up in the office in one to two weeks for incision check and she is to call for temperature greater than 100 degrees, persistent nausea or vomiting, heavy vaginal bleeding and/or redness or drainage from incision site.  DISCHARGE MEDICATIONS:        Prenatal vitamin one p.o. q.d. and Tylox as directed by M.D. DD:  09/28/00 TD:  09/28/00 Job: [id number]

## 2010-09-19 NOTE — Discharge Summary (Signed)
NAME:  Holly Mcmillan, Holly Mcmillan                 ACCOUNT NO.:  [id number]   MEDICAL RECORD NO.:  [id number]          PATIENT TYPE:  INP   LOCATION:  9115                          FACILITY:  [facility]   PHYSICIAN:  Dineen Kid. Rana Snare, M.D.    DATE OF BIRTH:  11-22-1970   DATE OF ADMISSION:  03/05/2008  DATE OF DISCHARGE:  03/08/2008                               DISCHARGE SUMMARY   ADMITTING DIAGNOSES:  1. Intrauterine pregnancy at 36-6/7th weeks' estimated gestational      age.  2. Spontaneous onset of labor.  3. Previously cesarean-scarred uterus, desires repeat.  4. Multiparity, desires permanent sterilization.   DISCHARGE DIAGNOSES:  1. Status post low transverse cesarean section.  2. Viable female infant.  3. Permanent sterilization.   REASON FOR ADMISSION:  Please see written H&P.   HOSPITAL COURSE:  The patient was a 40 year old, gravida 4, para 1,  abortus 2, who was admitted to [facility] at 36-6/7th  weeks' estimated gestational age with early labor.  The patient's  prenatal course had been remarkable for chronic hypertension which had  been treated with labetalol.  The patient was given oral terbutaline  which was not successful in suppressing her contractions, and for that  reason the patient was now admitted for delivery.  Due to multiparity,  the patient also requested permanent sterilization.  The patient was  then transferred to the operating room where spinal anesthesia was  administered without difficulty.  A low transverse incision was made  with delivery of a viable female infant weighing 5 pounds 13 ounces with  Apgars of 7 at 1 minute and 9 and 5 minutes.  The patient tolerated the  procedure well and was taken to the recovery room in stable condition.  On postoperative day 1, the patient was without complaint.  Baby was in  the NICU.  Vital signs were stable.  She was afebrile.  Blood pressure  101/65 to 107/71.  Abdomen soft.  Fundus firm and nontender.   Abdominal  dressing noted be clean, dry, and intact.  Foley was draining with  adequate amounts of urine output.  Laboratory findings revealed  hemoglobin of 11.5, platelet count of 252,000, WBC count of 15.2.  On  postoperative day 2, the patient was without complaint.  Vital signs  were stable.  She was afebrile.  Blood pressure 119/81 to 148/83.  Deep  tendon reflexes 1+ small pedal edema was noted.  Abdomen soft with good  return of bowel function.  Fundus was firm and nontender.  Abdominal  dressing had been removed revealing an incision that was clean, dry and  intact.  On postoperative day 3, the patient was somewhat tearful.  She  was uncertain regarding baby discharge from the NICU.  Vital signs were  stable.  Blood pressure 115/80.  Abdomen soft.  Fundus firm and  nontender.  Incision was clean, dry, and intact.  Staples removed ,and  the patient was later discharged home.   CONDITION ON DISCHARGE:  Stable.   DIET:  Regular as tolerated.   ACTIVITY:  No heavy lifting, no driving  x2 weeks, no vaginal entry.   FOLLOWUP:  The patient is to follow up in the office in 1 week for an  incision check.  She is to call for temperature greater than 100  degrees, persistent nausea, vomiting, heavy vaginal bleeding, and/or  redness or drainage from the incisional site.   DISCHARGE MEDICATIONS:  1. Percocet 5/325, #30 one p.o. 4-6 hours p.r.n.  2. Motrin 600 mg every 6 hours p.r.n.  3. Labetalol 100 mg one p.o. daily.  4. Prenatal vitamins one p.o. daily.      Julio Sicks, N.P.      Dineen Kid Rana Snare, M.D.  Electronically Signed    CC/MEDQ  D:  04/10/2008  T:  04/10/2008  Job:  [id number]

## 2010-09-19 NOTE — Op Note (Signed)
[facility]  Patient:    Holly Mcmillan, Holly Mcmillan                        MRN: [id number] Proc. Date: 09/24/00 Adm. Date:  [id number] Disc. Date: [id number] Attending:  Trevor Iha Dictator:   Freddy Finner, M.D.                           Operative Report  PREOPERATIVE DIAGNOSES:       1. Intrauterine pregnancy at [redacted] weeks                                  gestation.                               2. Breech presentation.                               3. In labor.                               4. Prenatal complications of preterm labor,                                  controlled with terbutaline until                                  approximately two days prior to admission.                               5. Mild late-onset gestational hypertension                                  with normal pregnancy-induced hypertension                                  panel.  POSTOPERATIVE DIAGNOSES:      1. Intrauterine pregnancy at [redacted] weeks                                  gestation.                               2. Breech presentation.                               3. In labor.                               4. Prenatal complications of preterm labor,  controlled with terbutaline until                                  approximately two days prior to admission.                               5. Mild late-onset gestational hypertension                                  with normal pregnancy-induced hypertension                                  panel.  OPERATION:                    Primary low transverse cervical cesarean                               section.  SURGEON:                      Freddy Finner, M.D.  ANESTHESIA:                   Spinal.  ESTIMATED INTRAOPERATIVE BLOOD LOSS:                   600 cc.  INTRAOPERATIVE COMPLICATION:  Laceration of ileum which was repaired.  OTHER INTRAOPERATIVE COMPLICATIONS:                 None.  DESCRIPTION OF PROCEDURE:     The patient is a 40 year old admitted in labor with a breech presentation confirmed by ultrasound in triage.  She was brought to the operating room and there placed under adequate spinal anesthesia and placed in the dorsal recumbent position with elevation of the right hip about 15 degrees.  The abdomen was prepped and draped in the usual fashion, a Foley catheter was placed using sterile technique, a sterile drape was applied.  A low abdominal transverse incision was made and carried sharply down to the fascia which was entered sharply and extended to the extent of the skin incision.  The rectus sheath was developed superiorly and inferiorly and with blunt and sharp dissection the rectus muscles were divided in the midline. The peritoneum was entered sharply and extended bluntly and sharply to the extent of skin incision.  In the process of extending the incision in the peritoneum on the right side the ileum which had prolapsed to the margin of the incision was lacerated.  This was immediately identified and was closed in a double layer with the first layer of interrupted 2-0 chromic sutures and a second layer of interrupted 3-0 silk sutures.  The first layer of closure was along the length of the incision in a transverse direction and the second at 90 degrees to this creating a double layer.  Immediately before closing there was a serosal defect also noted just above the primary laceration and this was closed with a single layer of interrupted 3-0 silk popoffs, three sutures were used.  It was felt that the lumen was adequate and there was no leakage from the incision line.  After repair of the  initial defect, the infant was then delivered.  A bladder blade was placed, a transverse incision was made in the vesicouterine peritoneum overlying the lower uterine segment.  The bladder was dissected off the lower segment, a transverse incision was made in  the lower segment and extended bluntly in a transverse direction.  The infant was in a frank breech presentation and was delivered by breech extraction without difficulty.  Apgars and pH are noted above.  Cord blood was obtained for arterial gases and for routine venous sample.  The placenta and other parts of conception were removed from the uterus.  The uterine incision was closed in a single layer of running-locking 0 Monocryl for the closure.  The tubes and ovaries were inspected and found to be normal.  There was a subserosal myoma measuring approximately 1 cm near the right cornual portion of the uterus and it was left in place.  Reinspection of the bowel repair was accomplished and the small serosal laceration was closed as noted above.  Copious irrigation was used throughout the case.  With appropriate pack, needle and instrument counts and adequate hemostasis, the abdominal incision was closed in layers. A running 0 Monocryl was used to close the peritoneum and reapproximate the rectus muscles, the fascia was closed with running 2-0 Panacryl because 0 Panacryl was not available, three figure-of-eight interrupteds of 0 Monocryl were placed strategically along the fascial closure for additional support. Irrigation of the subcutaneous space was carried.  The skin was closed with wide skin staples in the corner with Steri-Strips.  The patient tolerated the procedure well and was taken to recovery in good condition. DD:  09/24/00 TD:  09/24/00 Job: [id number]

## 2010-09-19 NOTE — H&P (Signed)
NAME:  Holly Mcmillan, Holly Mcmillan                           ACCOUNT NO.:  [id number]   MEDICAL RECORD NO.:  [id number]                   PATIENT TYPE:  EMS   LOCATION:  ED                                   [facility]ACILITY:  [facility]   PHYSICIAN:  Currie Paris, M.D.           DATE OF BIRTH:  1970-05-07   DATE OF ADMISSION:  09/07/2002  DATE OF DISCHARGE:                                HISTORY & PHYSICAL   PREOPERATIVE HISTORY AND PHYSICAL:   CHIEF COMPLAINT:  RUQ pain.   HISTORY OF PRESENT ILLNESS:  The patient is a 40 year old woman who Saturday  felt fairly persistent right upper quadrant pain with intermittent nausea.  Really not gotten any better and she saw Dr. Andrey Campanile yesterday and had an  ultrasound of the gallbladder done which was normal.  However, her pain has  persisted today and Dr. Andrey Campanile sent her over for hepatobiliary scan and  asked for [place] to evaluate her.   The patient continues to have some right upper quadrant pain but it is  better although she has now been seen by the EDP and been given Dilaudid,  Phenergan, and Zofran.  Her pain is located just to the right of the  epigastrium.  It does not seem to move any place and it is not made better  or worse by moving around.  Her nausea is now better as well.   She relates that she had a similar episode when she was pregnant 2 years ago  and in fact had an ultrasound done of her gallbladder then which was  negative.  Since her delivery of her baby 2 years ago she has had occasional  similar symptoms but never any that have lasted this long.   The patient states she is otherwise in good health.   MEDICATIONS:  Wellbutrin.  She is currently on some Tylox.  She is also on  birth control pills.   OPERATIONS:  She had a C-section and apparently the bowel was nicked when  that happened but was repaired with apparently no sequelae.   HABITS:  Does not smoke nor drink.   FAMILY HISTORY:  Unremarkable.   REVIEW OF SYSTEMS:  HEENT:  The patient has had no problems.  PULMONARY: She  has no cough or shortness of breath.  CARDIOVASCULAR: She has no heart  problems.  She has no current cardiac symptoms.  GI: The abdomen is negative  except for HPI.  GU is negative; she has regular menstrual periods.  EXTREMITIES: She has had no significant problems there.   PHYSICAL EXAMINATION:  GENERAL:  The patient is slightly obese but otherwise  a healthy-appearing female who is comfortable currently.  VITAL SIGNS:  Blood pressure 131/88, pulse 83, temperature 97.  HEAD:  Normocephalic.  EYES:  Pupils are equal, round, and regular; nonicteric.  NECK:  Supple; no masses or thyromegaly.  PHARYNX:  Normal with moist mucous membranes.  LUNGS:  Normal respirations.  HEART:  No murmurs, rubs, or gallops are heard.  ABDOMEN:  Soft with some tenderness deep in the right upper quadrant, it is  a minimal finding.  The remainder of the abdomen is benign.  Bowel sounds  are present.  EXTREMITIES:  No cyanosis or edema.  She has intact peripheral pulses.   DATA REVIEWED:  CBC shows a slight elevation in white count 13,000.  Amylase, lipase, and CMET are basically normal.  I reviewed the report of  her ultrasound which is negative.  I reviewed her HIDA scan with the  radiologist who read that and although the gallbladder fills promptly with  CCK stimulation it empties extremely slowly with about a 13% ejection  fraction.  CCK stimulation exacerbated her pain and gave her nausea.   IMPRESSION:  Biliary dyskinesia with fairly persistent pain.   PLAN:  I think she is a good candidate for a laparoscopic cholecystectomy.  I do not believe this urgent that it needs to be done this evening but  should be hopefully done in the next couple of days.  I have gone over the  indication, risks, complications including bleeding, infection, injury to  other organs, bile duct injuries, bile leaks, etc.  Appear to think all  questions have been answered.   The operating room has time tomorrow at noon  and we will let her go home this evening.  She will be n.p.o. tonight after  midnight and come back tomorrow morning for admission and her surgery.                                               Currie Paris, M.D.    CJS/MEDQ  D:  09/06/2002  T:  09/06/2002  Job:  [id number]

## 2010-09-19 NOTE — Discharge Summary (Signed)
NAMEMarland Kitchen  LOVIE, ZARLING NO.:  [id number]   MEDICAL RECORD NO.:  [id number]          PATIENT TYPE:  INP   LOCATION:  3705                         FACILITY:  [facility]   PHYSICIAN:  Vida Roller, M.D.   DATE OF BIRTH:  November 12, 1970   DATE OF ADMISSION:  03/12/2004  DATE OF DISCHARGE:  03/13/2004                           DISCHARGE SUMMARY - REFERRING   PROCEDURE:  A 2-D echocardiogram .   REASON FOR ADMISSION:  Ms. Holly Mcmillan is a 40 year old female with no prior  cardiac history.  Cardiac risk factors notable for tobacco smoking.  She  presented to the emergency room from Dr. Tawana Scale office, for evaluation of  acute pleuritic chest pain.   LABORATORY DATA:  Normal cardiac markers (x2).  D-dimer less than 0.22.  Normal electrolytes, renal function and liver enzymes.  TSH 1.11.  A wbc of  10.8, hemoglobin 13.9, hematocrit 40.6 and platelets 370.   Admission chest x-ray:  No acute disease.   Chest CT scan:  No evidence of pulmonary emboli; right thyroid hypodense  nodule.   HOSPITAL COURSE:  The patient was admitted for overnight observation and  further diagnostic testing.  Serial cardiac markers were normal as was a D-  dimer.  Given her persistent  pleuritic chest pain, however, recommendation  was to proceed with a CT scan of the chest.  This was negative for pulmonary  emboli but did note a right thyroid hypodense nodule.  Recommendation was to  follow up with ultrasonography.   Additionally, the patient had a 2-D echocardiogram  revealing normal left  ventricular function with no evidence of wall motion abnormalities; no  significant valvular abnormalities and no evidence of pericardial effusion.   The patient was felt to have either mild pericarditis versus chest wall  inflammation.   PLAN:  Discharge home with treatment with anti-inflammatory medication as  needed.   DISCHARGE MEDICATION:  Ibuprofen 200 mg one to two tablets q.4-6h. as  needed.   DISCHARGE  INSTRUCTIONS:  Follow nursing instructions.  Follow up with Dr.  Margrett Rud in one to two weeks for further evaluation of right thyroid  nodule.   DISCHARGE DIAGNOSES:  1.  Pleuritic chest pain.      1.  Normal serial cardiac markers.      2.  Normal 2-D echocardiogram.      3.  Negative chest CT scan for pulmonary emboli.  2.  Right thyroid nodule.      1.  Normal TSH level.  3.  Tobacco.  4.  Obesity.      Gene   GS/MEDQ  D:  03/13/2004  T:  03/13/2004  Job:  [id number]   cc:   Vale Haven. Andrey Campanile, M.D.  [address]  [place]  [place] [address]  Fax: [phone]

## 2010-09-19 NOTE — H&P (Signed)
NAMEMarland Mcmillan  Holly, Mcmillan NO.:  [id number]   MEDICAL RECORD NO.:  [id number]          PATIENT TYPE:  INP   LOCATION:  3705                         FACILITY:  [facility]   PHYSICIAN:  Vida Roller, M.D.   DATE OF BIRTH:  [birth year]   DATE OF ADMISSION:  03/12/2004  DATE OF DISCHARGE:                                HISTORY & PHYSICAL   REFERRING PHYSICIAN:  Duffy Rhody C. Andrey Campanile, M.D.   REASON FOR ADMISSION:  The patient is a 40 year old female with no prior  cardiac history and cardiac risk factors notable for tobacco smoking, who  presents to the emergency room with acute, severe left sternal chest  discomfort radiating to the left axilla.  The patient was awaken at  approximately 4 a.m. with sharp pain.  There was no associated dyspnea,  diaphoresis or nausea.  The symptoms have steadily worsened since then and  are exacerbated by deep inspiration or movement, but not by palpation.  She  denies any prior history of exertional chest discomfort or dyspnea.  She  also denied any recent long trips.   ALLERGIES:  SULFA.   MEDICATIONS PRIOR TO ADMISSION:  None.   PAST MEDICAL HISTORY:  1.  Cholecystectomy.  2.  C-section.  3.  Obesity.   SOCIAL HISTORY:  [place] one  of her brothers and his wife.  She has one child.  She works for a [employer].  She has been smoking an average of one-half pack for the  past 10-15 years.  Occasionally drinks alcohol.  Denies illicit drug use.   FAMILY HISTORY:  Mother age 66 with no known history of heart disease.  Father age 76 with no known history of heart disease.   REVIEW OF SYSTEMS:  Denies any recent fever, productive cough, chills or  evidence of upper or lower GI bleeding.  Denies orthopnea or paroxysmal  nocturnal dyspnea, but has occasional mild pedal edema.  Occasional  palpitations.  Denies symptoms suggestive of gastrointestinal reflux  disease.  Denies history  of thyroid disease.  Otherwise as per HPI.  The  remaining systems are negative.   PHYSICAL EXAMINATION:  VITAL SIGNS:  Blood pressure 140/91, pulse 92 and  regular, temperature 97.7 degrees, respirations 18, SAO2 97 (RA).  GENERAL APPEARANCE:  This is a 40 year old female in no apparent distress.  HEENT:  Normocephalic and atraumatic.  NECK:  Preserved bilateral carotid pulses without bruits.  LUNGS:  Clear to auscultation in all fields.  HEART:  Regular rate and rhythm (S1 and S2).  No murmurs, rubs or gallops.  ABDOMEN:  Protuberant and nontender.  Intact bowel sounds.  EXTREMITIES:  Preserved bilateral femoral and dorsalis pedis pulses with  trace pedal edema.  NEUROLOGIC:  No focal deficit.   LABORATORY DATA:  Chest x-ray pending.   Electrocardiogram:  Normal sinus rhythm at 79 bpm with right axis deviation;  no ischemic changes.   Laboratory data pending.   IMPRESSION:  1.  Acute pleuritic chest pain.  2.  Tobacco.  3.  Obesity.  4.  Status post cholecystectomy.   PLAN:  The patient's symptoms are more concerning for a pulmonary embolus  versus ischemic heart disease.  We will therefore check a D-dimer level, as  well as a CT scan for definitive exclusion of pulmonary embolus.  Cardiac  markers will be cycled.  A 2-D echocardiogram will be ordered for exclusion  of structural abnormalities and pericardial effusion.  The patient will be  started on aspirin and placed on Lovenox, as well as a proton pump  inhibitor.  Further recommendations will be made pending review of her.      Gene   GS/MEDQ  D:  03/13/2004  T:  03/13/2004  Job:  [id number]   cc:   Vale Haven. Andrey Campanile, M.D.  [address]  [place]  [place] [address]  Fax: [phone]

## 2010-10-13 ENCOUNTER — Encounter (HOSPITAL_COMMUNITY)
Admission: RE | Admit: 2010-10-13 | Discharge: 2010-10-13 | Disposition: A | Discharge status: Home / Self Care | Payer: 59 | Source: Ambulatory Visit | Attending: Otolaryngology | Admitting: Otolaryngology

## 2010-10-13 LAB — CBC
Hemoglobin: 13.4 g/dL (ref 12.0–15.0)
MCH: 29.3 pg (ref 26.0–34.0)
MCHC: 33.1 g/dL (ref 30.0–36.0)
Platelets: 316 10*3/uL (ref 150–400)
RDW: 13.8 % (ref 11.5–15.5)

## 2010-10-13 LAB — BASIC METABOLIC PANEL
BUN: 13 mg/dL (ref 6–23)
CO2: 28 mEq/L (ref 19–32)
Chloride: 104 mEq/L (ref 96–112)
Creatinine, Ser: 0.81 mg/dL (ref 0.4–1.2)
Glucose, Bld: 81 mg/dL (ref 70–99)

## 2010-10-13 LAB — SURGICAL PCR SCREEN: MRSA, PCR: NEGATIVE

## 2010-10-15 ENCOUNTER — Ambulatory Visit (HOSPITAL_COMMUNITY)
Admission: RE | Admit: 2010-10-15 | Discharge: 2010-10-16 | Disposition: A | Discharge status: Home / Self Care | Payer: 59 | Source: Ambulatory Visit | Attending: Otolaryngology | Admitting: Otolaryngology

## 2010-10-15 ENCOUNTER — Other Ambulatory Visit: Payer: Self-pay | Admitting: Otolaryngology

## 2010-10-15 DIAGNOSIS — D34 Benign neoplasm of thyroid gland: Secondary | ICD-10-CM | POA: Insufficient documentation

## 2010-10-15 DIAGNOSIS — F17200 Nicotine dependence, unspecified, uncomplicated: Secondary | ICD-10-CM | POA: Insufficient documentation

## 2010-10-15 DIAGNOSIS — Z01812 Encounter for preprocedural laboratory examination: Secondary | ICD-10-CM | POA: Insufficient documentation

## 2010-10-15 DIAGNOSIS — E0580 Other thyrotoxicosis without thyrotoxic crisis or storm: Secondary | ICD-10-CM | POA: Insufficient documentation

## 2010-10-15 DIAGNOSIS — I10 Essential (primary) hypertension: Secondary | ICD-10-CM | POA: Insufficient documentation

## 2010-10-15 DIAGNOSIS — Z01818 Encounter for other preprocedural examination: Secondary | ICD-10-CM | POA: Insufficient documentation

## 2010-10-15 DIAGNOSIS — E669 Obesity, unspecified: Secondary | ICD-10-CM | POA: Insufficient documentation

## 2010-10-15 LAB — CALCIUM: Calcium: 8.5 mg/dL (ref 8.4–10.5)

## 2010-10-16 LAB — CALCIUM: Calcium: 7.9 mg/dL — ABNORMAL LOW (ref 8.4–10.5)

## 2010-11-26 NOTE — Op Note (Signed)
NAMEMarland Kitchen  Holly Mcmillan, Holly Mcmillan NO.:  [id number]  MEDICAL RECORD NO.:  [id number]  LOCATION:  5151                         FACILITY:  [facility]  PHYSICIAN:  Antony Contras, MD     DATE OF BIRTH:  May 04, 1971  DATE OF PROCEDURE:  10/15/2010 DATE OF DISCHARGE:                              OPERATIVE REPORT   PREOPERATIVE DIAGNOSES:  Right thyroid nodule and hyperthyroidism.  POSTOPERATIVE DIAGNOSES:  Right thyroid nodule and hyperthyroidism.  PROCEDURE:  Total thyroidectomy.  SURGEON:  Antony Contras, MD  ASSISTANT:  Aquilla Hacker, [facility]  ANESTHESIA:  General endotracheal anesthesia.  COMPLICATIONS:  None.  INDICATIONS:  The patient is a 40 year old white female who has a 6-year history of a right thyroid nodule that has increased in size.  Previous FNA was inconclusive.  She also has hyperthyroidism and presents to the operating room for surgical management.  FINDINGS:  The left thyroid lobe appeared and felt normal while the right thyroid lobe had a sizable dark nodule inferiorly.  DESCRIPTION OF PROCEDURE:  The patient was identified in the holding room and after informed consent having been obtained including discussion of risks, benefits and alternatives, the patient was brought to the operative suite and put on the operating table in supine position.  Anesthesia was induced and the patient was intubated by the anesthesia team without difficulty.  The patient was given intravenous antibiotics during the case.  The eyes were taped closed and a shoulder roll was placed.  The anterior neck incision was marked with marking pen and injected with 1% lidocaine with 1:100,000 epinephrine.  The neck was prepped and draped in sterile fashion.  Incision was made with the 15 blade scalpel through the skin and extended through subcutaneous layer and platysma muscle using Bovie electrocautery.  Subplatysmal flaps were elevated superiorly and inferiorly.  A self-retaining  retractor was then added.  The midline raphe of the strap muscles was then divided and the left-sided strap muscles were retracted laterally.  Dissection was then performed around the periphery at the left thyroid lobe staying along the capsule.  Vessels were divided and ligated inferiorly and then the superior pole was carefully dissected by retracting the lobe inferiorly and ligating and dividing vessels.  The lobe was then rolled anteriorly into the right allowing dissection along the capsule and deep to the lobe.  The inferior parathyroid gland was easily identified and kept intact and dissected from below.  With further dissection around the deep part of the lobe, the recurrent laryngeal nerve was easily identified and kept intact while the lobe was further dissected off of the trachea.  The midline of the isthmus was divided using Bovie electrocautery and the left side lobe was then removed.  The lobe was passed by nursing for pathology.  A gauze pack was placed then in the left side dissection site.  The same procedure was then carried on the right side dissecting around the periphery of the capsule and dividing and ligating vessels inferiorly as well as the superior pole.  Both the superior and inferior parathyroid glands were identified on this side and dissected free from the gland.  Careful dissection was performed  around the deep part of the gland until the recurrent laryngeal nerve of these was identified and traced and kept intact.  The lobe was then carefully dissected off of the nerve area and off the trachea and removed.  This was passed by nursing for pathology.  A pack was placed on the right side as well.  At this point, the left-sided pack was removed and any bleeding seen was controlled with bipolar electrocautery.  The same was then performed on the right side.  The wounds were copiously irrigated with saline and after a Valsalva was given, no additional bleeding  was seen.  A 7-French suction drain was placed into the wound and secured at the left side using 2-0 nylon in a standard drain stitch.  The midline raphe was closed with 3-0 Vicryl in a simple interpreted fashion as was the platysma layer.  The subcutaneous layer was closed with a 4-0 Vicryl in a simple interrupted fashion.  The skin was closed with Dermabond.  The drain was hooked to suction during closure.  At this point, the patient was returned to Anesthesia for wake-up.  During wake-up, the drain was attached to the left shoulder.  She was extubated and moved to recovery room in stable condition.     Antony Contras, MD     DDB/MEDQ  D:  10/15/2010  T:  10/16/2010  Job:  [id number]  Electronically Signed by Christia Reading MD on 11/26/2010 08:11:07 PM

## 2011-02-03 LAB — CBC
Hemoglobin: 11.5 — ABNORMAL LOW
Hemoglobin: 12.5
MCV: 91.8
RBC: 3.72 — ABNORMAL LOW
RDW: 13.2
WBC: 15.2 — ABNORMAL HIGH
WBC: 19 — ABNORMAL HIGH

## 2011-02-05 LAB — COMPREHENSIVE METABOLIC PANEL
ALT: 31 U/L (ref 0–35)
AST: 24 U/L (ref 0–37)
Albumin: 3.8 g/dL (ref 3.5–5.2)
Alkaline Phosphatase: 76 U/L (ref 39–117)
Chloride: 107 mEq/L (ref 96–112)
GFR calc Af Amer: >60 mL/min (ref >60)
Potassium: 3.5 mEq/L (ref 3.5–5.1)
Total Bilirubin: 0.4 mg/dL (ref 0.3–1.2)

## 2011-02-05 LAB — URINALYSIS, ROUTINE W REFLEX MICROSCOPIC
Glucose, UA: NEGATIVE mg/dL
Ketones, ur: NEGATIVE mg/dL
Leukocytes, UA: NEGATIVE
pH: 6 (ref 5.0–8.0)

## 2011-02-05 LAB — URINE MICROSCOPIC-ADD ON

## 2011-02-05 LAB — CBC
Platelets: 309 10*3/uL (ref 150–400)
WBC: 10.2 10*3/uL (ref 4.0–10.5)

## 2011-06-08 ENCOUNTER — Encounter: Payer: 59 | Attending: Endocrinology | Admitting: *Deleted

## 2011-06-08 DIAGNOSIS — E119 Type 2 diabetes mellitus without complications: Secondary | ICD-10-CM | POA: Insufficient documentation

## 2011-06-08 DIAGNOSIS — Z713 Dietary counseling and surveillance: Secondary | ICD-10-CM | POA: Insufficient documentation

## 2011-06-09 ENCOUNTER — Encounter: Payer: Self-pay | Admitting: *Deleted

## 2011-06-09 NOTE — Patient Instructions (Signed)
Patient will attend Core Diabetes Courses II and III as scheduled or follow up prn.  

## 2011-06-09 NOTE — Progress Notes (Signed)
  Patient was seen on 06/08/11 for the first of a series of three diabetes self-management courses at the [facility]. The following learning objectives were met by the patient during this course:   Defines the role of glucose and insulin  Identifies type of diabetes and pathophysiology  Defines the diagnostic criteria for diabetes and prediabetes  States the risk factors for Type 2 Diabetes  States the symptoms of Type 2 Diabetes  Defines Type 2 Diabetes treatment goals  Defines Type 2 Diabetes treatment options  States the rationale for glucose monitoring  Identifies A1C, glucose targets, and testing times  Identifies proper sharps disposal  Defines the purpose of a diabetes food plan  Identifies carbohydrate food groups  Defines effects of carbohydrate foods on glucose levels  Identifies carbohydrate choices/grams/food labels  States benefits of physical activity and effect on glucose  Review of suggested activity guidelines  Last A1c: 6.0% (05/19/11 per MD referral)  Handouts given during class include:  Type 2 Diabetes: Basics Book  My Food Plan Book  Food and Activity Log  My Plate Planner  Patient has established the following initial goals:  Increase exercise.  Follow a diabetes meal plan.  Work on stress levels.  Stop smoking.  Keep doctor's appointments.  Lose weight.  Follow-Up Plan: Patient will attend Core Diabetes Courses II and III in April 2013 as scheduled or follow up prn.

## 2011-07-25 ENCOUNTER — Other Ambulatory Visit: Payer: Self-pay | Admitting: Internal Medicine

## 2011-08-11 ENCOUNTER — Encounter (HOSPITAL_COMMUNITY): Payer: Self-pay | Admitting: Emergency Medicine

## 2011-08-11 ENCOUNTER — Ambulatory Visit: Payer: 59

## 2011-08-11 ENCOUNTER — Emergency Department (HOSPITAL_COMMUNITY)
Admission: EM | Admit: 2011-08-11 | Discharge: 2011-08-11 | Disposition: A | Discharge status: Home / Self Care | Payer: 59 | Attending: Emergency Medicine | Admitting: Emergency Medicine

## 2011-08-11 DIAGNOSIS — R51 Headache: Secondary | ICD-10-CM

## 2011-08-11 DIAGNOSIS — F329 Major depressive disorder, single episode, unspecified: Secondary | ICD-10-CM | POA: Insufficient documentation

## 2011-08-11 DIAGNOSIS — E119 Type 2 diabetes mellitus without complications: Secondary | ICD-10-CM | POA: Insufficient documentation

## 2011-08-11 DIAGNOSIS — F3289 Other specified depressive episodes: Secondary | ICD-10-CM | POA: Insufficient documentation

## 2011-08-11 DIAGNOSIS — I10 Essential (primary) hypertension: Secondary | ICD-10-CM | POA: Insufficient documentation

## 2011-08-11 MED ORDER — SODIUM CHLORIDE 0.9 % IV BOLUS (SEPSIS)
1000.0000 mL | Freq: Once | INTRAVENOUS | Status: AC
  Administered 2011-08-11: 1000 mL via INTRAVENOUS

## 2011-08-11 MED ORDER — KETOROLAC TROMETHAMINE 15 MG/ML IJ SOLN
15.0000 mg | Freq: Once | INTRAMUSCULAR | Status: AC
  Administered 2011-08-11: 15 mg via INTRAVENOUS
  Filled 2011-08-11: qty 1

## 2011-08-11 MED ORDER — DIPHENHYDRAMINE HCL 50 MG/ML IJ SOLN
25.0000 mg | Freq: Once | INTRAMUSCULAR | Status: AC
  Administered 2011-08-11: 25 mg via INTRAVENOUS
  Filled 2011-08-11: qty 1

## 2011-08-11 MED ORDER — METOCLOPRAMIDE HCL 5 MG/ML IJ SOLN
10.0000 mg | Freq: Once | INTRAMUSCULAR | Status: AC
  Administered 2011-08-11: 10 mg via INTRAVENOUS
  Filled 2011-08-11: qty 2

## 2011-08-11 NOTE — ED Provider Notes (Signed)
History    41 year old female with headache. Gradual onset last night. Headache is diffuse and achy. No appreciable exacerbating relieving factors. Constant .No neck pain or stiffness. Denies trauma. No fevers or chills. No acute visual changes. No numbness, tingling or loss of strength. No family history of cerebral aneurysm that she is aware.   CSN: [id number]  Arrival date & time 08/11/11  1502   First MD Initiated Contact with Patient 08/11/11 1725      Chief Complaint  Patient presents with  . Headache    (Consider location/radiation/quality/duration/timing/severity/associated sxs/prior treatment) HPI  Past Medical History  Diagnosis Date  . Depression   . Headache   . Hypertension   . Ovarian cyst   . Dysmenorrhea   . Obesity   . Diabetes mellitus 05/2011    Dr. Dorisann Frames    Past Surgical History  Procedure Date  . Nasal sinus surgery   . Cesarean section 2002, 2009  . Thyroid surgery 2012  . Cholecystectomy 2005  . Ablation     Family History  Problem Relation Age of Onset  . Alcohol abuse Other   . Arthritis Other   . Hypertension Other   . Kidney disease Other   . Cancer Other     lung, ovarian, uterine, intestinal, pancreatic, leukemia, colon  . Heart disease Other     History  Substance Use Topics  . Smoking status: Current Everyday Smoker  . Smokeless tobacco: Not on file  . Alcohol Use: No    OB History    Grav Para Term Preterm Abortions TAB SAB Ect Mult Living                  Review of Systems   Review of symptoms negative unless otherwise noted in HPI.   Allergies  Sulfonamide derivatives  Home Medications   Current Outpatient Rx  Name Route Sig Dispense Refill  . BIOTIN PO Oral Take 1 capsule by mouth daily.    Marland Kitchen LEVOTHYROXINE SODIUM 175 MCG PO TABS Oral Take 175 mcg by mouth daily.    Marland Kitchen METFORMIN HCL 500 MG PO TABS Oral Take 500 mg by mouth daily.      BP 158/98  Pulse 75  Temp(Src) 97.9 F (36.6 C) (Oral)  Resp  16  SpO2 98%  Physical Exam  Nursing note and vitals reviewed. Constitutional: She is oriented to person, place, and time.       Laying in bed. No acute distress. Obese.  HENT:  Head: Normocephalic and atraumatic.  Eyes: Conjunctivae and EOM are normal. Pupils are equal, round, and reactive to light. Right eye exhibits no discharge. Left eye exhibits no discharge.  Neck: Neck supple.  Cardiovascular: Normal rate, regular rhythm and normal heart sounds.  Exam reveals no gallop and no friction rub.   No murmur heard. Pulmonary/Chest: Effort normal and breath sounds normal. No respiratory distress.  Abdominal: Soft. She exhibits no distension. There is no tenderness.  Musculoskeletal: She exhibits no edema and no tenderness.  Lymphadenopathy:    She has no cervical adenopathy.  Neurological: She is alert and oriented to person, place, and time. No cranial nerve deficit. She exhibits normal muscle tone. Coordination normal.       Cranial nerves are intact. Visual fields intact to confrontation. Strength out of 5 bilateral upper lower extremity is. Patient has good finger to nose and heel to shin testing bilaterally. Gait steady  Skin: Skin is warm and dry. She is not diaphoretic.  Psychiatric:  She has a normal mood and affect. Her behavior is normal. Thought content normal.    ED Course  Procedures (including critical care time)  Labs Reviewed - No data to display No results found.   1. Headache    6:43 PM Pt reassessed. No new complaints. Mild Ha still but markedly improved. Return precautions discussed. Outpt fu.   MDM  41 year old female with headache. Suspect primary HA. Consider emergent secondary causes such as bleed, infectious or mass but doubt. There is no history of trauma. Pt has a nonfocal neurological exam. Afebrile and neck supple. No use of blood thinning medication. Consider ocular etiology such as acute angle closure glaucoma but doubt. Pt denies acute change in  visual acuity and eye exam unremarkable. Doubt temporal arteritis given age, no temporal tenderness and temporal artery pulsations palpable. Doubt CO poisoning. No contacts with similar symptoms. Doubt venous thrombosis. Doubt carotid or vertebral arteries dissection. Symptoms improved with meds. Feel that can be safely discharged, but strict return precautions discussed. Outpt fu. Raeford Razor, MD 08/20/11 [phone]

## 2011-08-11 NOTE — Discharge Instructions (Signed)
Headaches, Frequently Asked Questions MIGRAINE HEADACHES Q: What is migraine? What causes it? How can I treat it? A: Generally, migraine headaches begin as a dull ache. Then they develop into a constant, throbbing, and pulsating pain. You may experience pain at the temples. You may experience pain at the front or back of one or both sides of the head. The pain is usually accompanied by a combination of:  Nausea.   Vomiting.   Sensitivity to light and noise.  Some people (about 15%) experience an aura (see below) before an attack. The cause of migraine is believed to be chemical reactions in the brain. Treatment for migraine may include over-the-counter or prescription medications. It may also include self-help techniques. These include relaxation training and biofeedback.  Q: What is an aura? A: About 15% of people with migraine get an "aura". This is a sign of neurological symptoms that occur before a migraine headache. You may see wavy or jagged lines, dots, or flashing lights. You might experience tunnel vision or blind spots in one or both eyes. The aura can include visual or auditory hallucinations (something imagined). It may include disruptions in smell (such as strange odors), taste or touch. Other symptoms include:  Numbness.   A "pins and needles" sensation.   Difficulty in recalling or speaking the correct word.  These neurological events may last as long as 60 minutes. These symptoms will fade as the headache begins. Q: What is a trigger? A: Certain physical or environmental factors can lead to or "trigger" a migraine. These include:  Foods.   Hormonal changes.   Weather.   Stress.  It is important to remember that triggers are different for everyone. To help prevent migraine attacks, you need to figure out which triggers affect you. Keep a headache diary. This is a good way to track triggers. The diary will help you talk to your healthcare professional about your  condition. Q: Does weather affect migraines? A: Bright sunshine, hot, humid conditions, and drastic changes in barometric pressure may lead to, or "trigger," a migraine attack in some people. But studies have shown that weather does not act as a trigger for everyone with migraines. Q: What is the link between migraine and hormones? A: Hormones start and regulate many of your body's functions. Hormones keep your body in balance within a constantly changing environment. The levels of hormones in your body are unbalanced at times. Examples are during menstruation, pregnancy, or menopause. That can lead to a migraine attack. In fact, about three quarters of all women with migraine report that their attacks are related to the menstrual cycle.  Q: Is there an increased risk of stroke for migraine sufferers? A: The likelihood of a migraine attack causing a stroke is very remote. That is not to say that migraine sufferers cannot have a stroke associated with their migraines. In persons under age 40, the most common associated factor for stroke is migraine headache. But over the course of a person's normal life span, the occurrence of migraine headache may actually be associated with a reduced risk of dying from cerebrovascular disease due to stroke.  Q: What are acute medications for migraine? A: Acute medications are used to treat the pain of the headache after it has started. Examples over-the-counter medications, NSAIDs, ergots, and triptans.  Q: What are the triptans? A: Triptans are the newest class of abortive medications. They are specifically targeted to treat migraine. Triptans are vasoconstrictors. They moderate some chemical reactions in the brain.   The triptans work on receptors in your brain. Triptans help to restore the balance of a neurotransmitter called serotonin. Fluctuations in levels of serotonin are thought to be a main cause of migraine.  Q: Are over-the-counter medications for migraine  effective? A: Over-the-counter, or "OTC," medications may be effective in relieving mild to moderate pain and associated symptoms of migraine. But you should see your caregiver before beginning any treatment regimen for migraine.  Q: What are preventive medications for migraine? A: Preventive medications for migraine are sometimes referred to as "prophylactic" treatments. They are used to reduce the frequency, severity, and length of migraine attacks. Examples of preventive medications include antiepileptic medications, antidepressants, beta-blockers, calcium channel blockers, and NSAIDs (nonsteroidal anti-inflammatory drugs). Q: Why are anticonvulsants used to treat migraine? A: During the past few years, there has been an increased interest in antiepileptic drugs for the prevention of migraine. They are sometimes referred to as "anticonvulsants". Both epilepsy and migraine may be caused by similar reactions in the brain.  Q: Why are antidepressants used to treat migraine? A: Antidepressants are typically used to treat people with depression. They may reduce migraine frequency by regulating chemical levels, such as serotonin, in the brain.  Q: What alternative therapies are used to treat migraine? A: The term "alternative therapies" is often used to describe treatments considered outside the scope of conventional Western medicine. Examples of alternative therapy include acupuncture, acupressure, and yoga. Another common alternative treatment is herbal therapy. Some herbs are believed to relieve headache pain. Always discuss alternative therapies with your caregiver before proceeding. Some herbal products contain arsenic and other toxins. TENSION HEADACHES Q: What is a tension-type headache? What causes it? How can I treat it? A: Tension-type headaches occur randomly. They are often the result of temporary stress, anxiety, fatigue, or anger. Symptoms include soreness in your temples, a tightening  band-like sensation around your head (a "vice-like" ache). Symptoms can also include a pulling feeling, pressure sensations, and contracting head and neck muscles. The headache begins in your forehead, temples, or the back of your head and neck. Treatment for tension-type headache may include over-the-counter or prescription medications. Treatment may also include self-help techniques such as relaxation training and biofeedback. CLUSTER HEADACHES Q: What is a cluster headache? What causes it? How can I treat it? A: Cluster headache gets its name because the attacks come in groups. The pain arrives with little, if any, warning. It is usually on one side of the head. A tearing or bloodshot eye and a runny nose on the same side of the headache may also accompany the pain. Cluster headaches are believed to be caused by chemical reactions in the brain. They have been described as the most severe and intense of any headache type. Treatment for cluster headache includes prescription medication and oxygen. SINUS HEADACHES Q: What is a sinus headache? What causes it? How can I treat it? A: When a cavity in the bones of the face and skull (a sinus) becomes inflamed, the inflammation will cause localized pain. This condition is usually the result of an allergic reaction, a tumor, or an infection. If your headache is caused by a sinus blockage, such as an infection, you will probably have a fever. An x-ray will confirm a sinus blockage. Your caregiver's treatment might include antibiotics for the infection, as well as antihistamines or decongestants.  REBOUND HEADACHES Q: What is a rebound headache? What causes it? How can I treat it? A: A pattern of taking acute headache medications too   often can lead to a condition known as "rebound headache." A pattern of taking too much headache medication includes taking it more than 2 days per week or in excessive amounts. That means more than the label or a caregiver advises.  With rebound headaches, your medications not only stop relieving pain, they actually begin to cause headaches. Doctors treat rebound headache by tapering the medication that is being overused. Sometimes your caregiver will gradually substitute a different type of treatment or medication. Stopping may be a challenge. Regularly overusing a medication increases the potential for serious side effects. Consult a caregiver if you regularly use headache medications more than 2 days per week or more than the label advises. ADDITIONAL QUESTIONS AND ANSWERS Q: What is biofeedback? A: Biofeedback is a self-help treatment. Biofeedback uses special equipment to monitor your body's involuntary physical responses. Biofeedback monitors:  Breathing.   Pulse.   Heart rate.   Temperature.   Muscle tension.   Brain activity.  Biofeedback helps you refine and perfect your relaxation exercises. You learn to control the physical responses that are related to stress. Once the technique has been mastered, you do not need the equipment any more. Q: Are headaches hereditary? A: Four out of five (80%) of people that suffer report a family history of migraine. Scientists are not sure if this is genetic or a family predisposition. Despite the uncertainty, a child has a 50% chance of having migraine if one parent suffers. The child has a 75% chance if both parents suffer.  Q: Can children get headaches? A: By the time they reach high school, most young people have experienced some type of headache. Many safe and effective approaches or medications can prevent a headache from occurring or stop it after it has begun.  Q: What type of doctor should I see to diagnose and treat my headache? A: Start with your primary caregiver. Discuss his or her experience and approach to headaches. Discuss methods of classification, diagnosis, and treatment. Your caregiver may decide to recommend you to a headache specialist, depending upon  your symptoms or other physical conditions. Having diabetes, allergies, etc., may require a more comprehensive and inclusive approach to your headache. The [employer] will provide, upon request, a list of NHF physician members in your state. Document Released: 07/11/2003 Document Revised: 04/09/2011 Document Reviewed: 12/19/2007 Exit[employer] Patient Information 2012 [employer].Headache, General, Unknown Cause The specific cause of your headache may not have been found today. There are many causes and types of headache. A few common ones are:  Tension headache.   Migraine.   Infections (examples: dental and sinus infections).   Bone and/or joint problems in the neck or jaw.   Depression.   Eye problems.  These headaches are not life threatening.  Headaches can sometimes be diagnosed by a patient history and a physical exam. Sometimes, lab and imaging studies (such as x-ray and/or CT scan) are used to rule out more serious problems. In some cases, a spinal tap (lumbar puncture) may be requested. There are many times when your exam and tests may be normal on the first visit even when there is a serious problem causing your headaches. Because of that, it is very important to follow up with your doctor or local clinic for further evaluation. FINDING OUT THE RESULTS OF TESTS  If a radiology test was performed, a radiologist will review your results.   You will be contacted by the emergency department or your physician if any test results   require a change in your treatment plan.   Not all test results may be available during your visit. If your test results are not back during the visit, make an appointment with your caregiver to find out the results. Do not assume everything is normal if you have not heard from your caregiver or the medical facility. It is important for you to follow up on all of your test results.  HOME CARE INSTRUCTIONS   Keep follow-up appointments with  your caregiver, or any specialist referral.   Only take over-the-counter or prescription medicines for pain, discomfort, or fever as directed by your caregiver.   Biofeedback, massage, or other relaxation techniques may be helpful.   Ice packs or heat applied to the head and neck can be used. Do this three to four times per day, or as needed.   Call your doctor if you have any questions or concerns.   If you smoke, you should quit.  SEEK MEDICAL CARE IF:   You develop problems with medications prescribed.   You do not respond to or obtain relief from medications.   You have a change from the usual headache.   You develop nausea or vomiting.  SEEK IMMEDIATE MEDICAL CARE IF:   If your headache becomes severe.   You have an unexplained oral temperature above 102 F (38.9 C), or as your caregiver suggests.   You have a stiff neck.   You have loss of vision.   You have muscular weakness.   You have loss of muscular control.   You develop severe symptoms different from your first symptoms.   You start losing your balance or have trouble walking.   You feel faint or pass out.  MAKE SURE YOU:   Understand these instructions.   Will watch your condition.   Will get help right away if you are not doing well or get worse.  Document Released: 04/20/2005 Document Revised: 04/09/2011 Document Reviewed: 12/08/2007 Exit[employer] Patient Information 2012 [employer]. 

## 2011-08-11 NOTE — ED Notes (Signed)
Pt reports, "I have a bad headache." Started last night. Pt reports nausea, denies changes in vision.

## 2011-08-18 ENCOUNTER — Ambulatory Visit: Payer: 59

## 2011-09-15 ENCOUNTER — Encounter: Payer: 59 | Attending: Endocrinology

## 2011-09-15 DIAGNOSIS — E119 Type 2 diabetes mellitus without complications: Secondary | ICD-10-CM | POA: Insufficient documentation

## 2011-09-15 DIAGNOSIS — Z713 Dietary counseling and surveillance: Secondary | ICD-10-CM | POA: Insufficient documentation

## 2011-09-15 DIAGNOSIS — I10 Essential (primary) hypertension: Secondary | ICD-10-CM

## 2011-09-16 LAB — HM MAMMOGRAPHY: HM Mammogram: NORMAL

## 2011-09-28 NOTE — Progress Notes (Signed)
  Patient was seen on 09/22/2011 for the third of a series of three diabetes self-management courses at the [facility]. The following learning objectives were met by the patient during this course:    Describe how diabetes changes over time   Identify diabetes complications and ways to prevent them   Describe strategies that can promote heart health including lowering blood pressure and cholesterol   Describe strategies to lower dietary fat and sodium in the diet   Identify physical activities that benefit cardiovascular health   Evaluate success in meeting personal goal   Describe the belief that they can live successfully with diabetes day to day   Establish 2-3 goals that they will plan to diligently work on until they return for the free 3-month follow-up visit  The following handouts were given in class:  3 Month Follow Up Visit handout  Goal setting handout  Class evaluation form  Your patient has established the following 3 month goals for diabetes self-care:  Count carbohydrates at most of my meals and snacks.  Be active 30 minutes or more 5 times a week.  Follow-Up Plan: Patient will attend a 3 month follow-up visit for diabetes self-management education.

## 2011-09-29 NOTE — Progress Notes (Signed)
  Patient was seen on 09/15/2011 for the second of a series of three diabetes self-management courses at the [facility]. The following learning objectives were met by the patient during this course:   Explain basic nutrition maintenance and quality assurance  Describe causes, symptoms and treatment of hypoglycemia and hyperglycemia  Explain how to manage diabetes during illness  Describe the importance of good nutrition for health and healthy eating strategies  List strategies to follow meal plan when dining out  Describe the effects of alcohol on glucose and how to use it safely  Describe problem solving skills for day-to-day glucose challenges  Describe strategies to use when treatment plan needs to change  Identify important factors involved in successful weight loss  Describe ways to remain physically active  Describe the impact of regular activity on insulin resistance  Handouts given in class:  Refrigerator magnet for Sick Day Guidelines  [facility] Oral medication/insulin handout  Follow-Up Plan: Patient will attend the final class of the ADA Diabetes Self-Care Education.   

## 2011-09-30 ENCOUNTER — Encounter: Payer: Self-pay | Admitting: Internal Medicine

## 2011-09-30 ENCOUNTER — Other Ambulatory Visit (INDEPENDENT_AMBULATORY_CARE_PROVIDER_SITE_OTHER): Payer: 59

## 2011-09-30 ENCOUNTER — Ambulatory Visit (INDEPENDENT_AMBULATORY_CARE_PROVIDER_SITE_OTHER): Payer: 59 | Admitting: Internal Medicine

## 2011-09-30 VITALS — BP 150/102 | HR 83 | Temp 97.1°F | Resp 20 | Wt 238.0 lb

## 2011-09-30 DIAGNOSIS — D72829 Elevated white blood cell count, unspecified: Secondary | ICD-10-CM

## 2011-09-30 DIAGNOSIS — I10 Essential (primary) hypertension: Secondary | ICD-10-CM

## 2011-09-30 DIAGNOSIS — R7309 Other abnormal glucose: Secondary | ICD-10-CM

## 2011-09-30 DIAGNOSIS — R739 Hyperglycemia, unspecified: Secondary | ICD-10-CM | POA: Insufficient documentation

## 2011-09-30 DIAGNOSIS — E039 Hypothyroidism, unspecified: Secondary | ICD-10-CM

## 2011-09-30 DIAGNOSIS — F3289 Other specified depressive episodes: Secondary | ICD-10-CM

## 2011-09-30 DIAGNOSIS — F329 Major depressive disorder, single episode, unspecified: Secondary | ICD-10-CM

## 2011-09-30 DIAGNOSIS — F17200 Nicotine dependence, unspecified, uncomplicated: Secondary | ICD-10-CM

## 2011-09-30 LAB — CBC WITH DIFFERENTIAL/PLATELET
Basophils Absolute: 0.1 10*3/uL (ref 0.0–0.1)
Eosinophils Absolute: 0.1 10*3/uL (ref 0.0–0.7)
HCT: 43.2 % (ref 36.0–46.0)
Hemoglobin: 14.2 g/dL (ref 12.0–15.0)
Lymphocytes Relative: 27 % (ref 12.0–46.0)
Lymphs Abs: 2.9 10*3/uL (ref 0.7–4.0)
MCHC: 32.8 g/dL (ref 30.0–36.0)
Neutro Abs: 7 10*3/uL (ref 1.4–7.7)
Platelets: 307 10*3/uL (ref 150.0–400.0)
RDW: 13.7 % (ref 11.5–14.6)

## 2011-09-30 LAB — COMPREHENSIVE METABOLIC PANEL
ALT: 22 U/L (ref 0–35)
AST: 15 U/L (ref 0–37)
Creatinine, Ser: 0.7 mg/dL (ref 0.4–1.2)
Sodium: 140 mEq/L (ref 135–145)
Total Bilirubin: 0.6 mg/dL (ref 0.3–1.2)

## 2011-09-30 LAB — LIPID PANEL
HDL: 44.2 mg/dL (ref >39.00)
Total CHOL/HDL Ratio: 5
Triglycerides: 179 mg/dL — ABNORMAL HIGH (ref 0.0–149.0)
VLDL: 35.8 mg/dL (ref 0.0–40.0)

## 2011-09-30 LAB — URINALYSIS, ROUTINE W REFLEX MICROSCOPIC
Bilirubin Urine: NEGATIVE
Leukocytes, UA: NEGATIVE
Nitrite: NEGATIVE
Total Protein, Urine: NEGATIVE
Urobilinogen, UA: 0.2 (ref 0.0–1.0)

## 2011-09-30 LAB — LDL CHOLESTEROL, DIRECT: Direct LDL: 147.4 mg/dL

## 2011-09-30 MED ORDER — OLMESARTAN MEDOXOMIL-HCTZ 40-12.5 MG PO TABS: 1.0000 | ORAL_TABLET | Freq: Every day | ORAL | Status: DC

## 2011-09-30 MED ORDER — BUPROPION HCL ER (XL) 300 MG PO TB24: 300.0000 mg | ORAL_TABLET | Freq: Every day | ORAL | Status: DC

## 2011-09-30 NOTE — Patient Instructions (Signed)

## 2011-09-30 NOTE — Progress Notes (Signed)
Subjective:    Patient ID: Holly Mcmillan, female    DOB: August 24, 1970, 41 y.o.   MRN: [id number]  Hypertension This is a chronic problem. The current episode started more than 1 year ago. The problem has been gradually worsening since onset. The problem is uncontrolled. Pertinent negatives include no anxiety, blurred vision, chest pain, headaches, malaise/fatigue, neck pain, orthopnea, palpitations, peripheral edema, PND, shortness of breath or sweats. There are no associated agents to hypertension. Past treatments include nothing. Hypertensive end-organ damage includes a thyroid problem.  Thyroid Problem Presents for follow-up visit. Patient reports no anxiety, cold intolerance, constipation, depressed mood, diaphoresis, diarrhea, dry skin, fatigue, hair loss, heat intolerance, hoarse voice, leg swelling, menstrual problem, palpitations, tremors, visual change, weight gain or weight loss. The symptoms have been stable.      Review of Systems  Constitutional: Negative for fever, chills, weight loss, weight gain, malaise/fatigue, diaphoresis, activity change, appetite change, fatigue and unexpected weight change.  HENT: Negative.  Negative for hoarse voice and neck pain.   Eyes: Negative.  Negative for blurred vision.  Respiratory: Negative for cough, chest tightness, shortness of breath, wheezing and stridor.   Cardiovascular: Negative for chest pain, palpitations, orthopnea, leg swelling and PND.  Gastrointestinal: Negative for nausea, vomiting, abdominal pain, diarrhea, constipation, abdominal distention, anal bleeding and rectal pain.  Genitourinary: Negative.  Negative for menstrual problem.  Musculoskeletal: Negative for myalgias, back pain, joint swelling, arthralgias and gait problem.  Skin: Negative for color change, pallor, rash and wound.  Neurological: Negative for dizziness, tremors, seizures, syncope, facial asymmetry, speech difficulty, weakness, light-headedness, numbness and  headaches.  Hematological: Negative for cold intolerance, heat intolerance and adenopathy. Does not bruise/bleed easily.  Psychiatric/Behavioral: Positive for dysphoric mood (some anhedonia sadness and ruminations). Negative for suicidal ideas, hallucinations, behavioral problems, confusion, sleep disturbance, self-injury, decreased concentration and agitation. The patient is not nervous/anxious and is not hyperactive.        Objective:   Physical Exam  Vitals reviewed. Constitutional: She is oriented to person, place, and time. She appears well-developed and well-nourished. No distress.  HENT:  Head: Normocephalic and atraumatic.  Mouth/Throat: Oropharynx is clear and moist. No oropharyngeal exudate.  Eyes: Conjunctivae are normal. Right eye exhibits no discharge. Left eye exhibits no discharge. No scleral icterus.  Neck: Normal range of motion. Neck supple. No JVD present. No tracheal deviation present. No thyromegaly present.  Cardiovascular: Normal rate, regular rhythm, normal heart sounds and intact distal pulses.  Exam reveals no gallop and no friction rub.   No murmur heard. Pulmonary/Chest: Effort normal and breath sounds normal. No stridor. No respiratory distress. She has no wheezes. She has no rales. She exhibits no tenderness.  Abdominal: Soft. Bowel sounds are normal. She exhibits no distension and no mass. There is no tenderness. There is no rebound and no guarding.  Musculoskeletal: Normal range of motion. She exhibits no edema and no tenderness.  Lymphadenopathy:    She has no cervical adenopathy.  Neurological: She is oriented to person, place, and time.  Skin: Skin is warm and dry. No rash noted. She is not diaphoretic. No erythema. No pallor.  Psychiatric: She has a normal mood and affect. Her behavior is normal. Judgment and thought content normal.      Lab Results  Component Value Date   WBC 11.7* 10/13/2010   HGB 13.4 10/13/2010   HCT 40.5 10/13/2010   PLT 316  10/13/2010   GLUCOSE 81 10/13/2010   ALT 23 06/30/2010   AST 17  06/30/2010   NA 141 10/13/2010   K 3.5 10/13/2010   CL 104 10/13/2010   CREATININE 0.81 10/13/2010   BUN 13 10/13/2010   CO2 28 10/13/2010   TSH 0.14* 06/30/2010      Assessment & Plan:

## 2011-10-01 ENCOUNTER — Encounter: Payer: Self-pay | Admitting: Internal Medicine

## 2011-10-02 ENCOUNTER — Telehealth: Payer: Self-pay

## 2011-10-02 DIAGNOSIS — I10 Essential (primary) hypertension: Secondary | ICD-10-CM

## 2011-10-02 NOTE — Telephone Encounter (Signed)
Received fax from [facility] stating that benicar is not covered by insurance. Alternatives are irbesartan/HCTZ, losartan/HCTZ, micardis hct or valsartan/hctz. Please advise

## 2011-10-03 MED ORDER — VALSARTAN-HYDROCHLOROTHIAZIDE 160-12.5 MG PO TABS: 1.0000 | ORAL_TABLET | Freq: Every day | ORAL | Status: DC

## 2011-10-04 NOTE — Assessment & Plan Note (Signed)
I will recheck her TSH today 

## 2011-10-04 NOTE — Assessment & Plan Note (Signed)
She is ready to quit smoking, we will see if the wellbutrin can help her

## 2011-10-04 NOTE — Assessment & Plan Note (Signed)
Her BP is not well controlled so I restarted meds for her today, she has had a SE from the spironolactone, I will check her labs today to look for secondary causes and end organ damage

## 2011-10-04 NOTE — Assessment & Plan Note (Signed)
I will check her a1c today to see if she has developed DM II 

## 2011-10-04 NOTE — Assessment & Plan Note (Signed)
She wants to restart wellbutrin

## 2011-10-04 NOTE — Assessment & Plan Note (Signed)
For a repeat CBC today 

## 2011-10-20 ENCOUNTER — Other Ambulatory Visit: Payer: Self-pay

## 2011-10-20 DIAGNOSIS — F3289 Other specified depressive episodes: Secondary | ICD-10-CM

## 2011-10-20 DIAGNOSIS — F329 Major depressive disorder, single episode, unspecified: Secondary | ICD-10-CM

## 2011-10-20 MED ORDER — BUPROPION HCL ER (XL) 300 MG PO TB24: 300.0000 mg | ORAL_TABLET | Freq: Every day | ORAL | Status: DC

## 2011-12-02 ENCOUNTER — Encounter: Payer: Self-pay | Admitting: Internal Medicine

## 2011-12-02 ENCOUNTER — Ambulatory Visit (INDEPENDENT_AMBULATORY_CARE_PROVIDER_SITE_OTHER): Payer: 59 | Admitting: Internal Medicine

## 2011-12-02 VITALS — BP 122/80 | HR 76 | Temp 98.2°F | Resp 16 | Wt 227.0 lb

## 2011-12-02 DIAGNOSIS — D72829 Elevated white blood cell count, unspecified: Secondary | ICD-10-CM

## 2011-12-02 DIAGNOSIS — R7309 Other abnormal glucose: Secondary | ICD-10-CM

## 2011-12-02 DIAGNOSIS — I10 Essential (primary) hypertension: Secondary | ICD-10-CM

## 2011-12-02 DIAGNOSIS — E039 Hypothyroidism, unspecified: Secondary | ICD-10-CM

## 2011-12-02 NOTE — Assessment & Plan Note (Signed)
Her BP is well controlled today 

## 2011-12-02 NOTE — Progress Notes (Signed)
  Subjective:    Patient ID: Holly Mcmillan, female    DOB: 05/28/1970, 41 y.o.   MRN: [id number]  Hypertension This is a chronic problem. The current episode started more than 1 year ago. The problem has been gradually improving since onset. The problem is controlled. Pertinent negatives include no anxiety, blurred vision, chest pain, headaches, malaise/fatigue, neck pain, orthopnea, palpitations, peripheral edema, PND, shortness of breath or sweats. Risk factors for coronary artery disease include obesity. Past treatments include angiotensin blockers and diuretics. Compliance problems include exercise and diet.  Hypertensive end-organ damage includes a thyroid problem.      Review of Systems  Constitutional: Negative.  Negative for fever, chills, malaise/fatigue, activity change, appetite change, fatigue and unexpected weight change.  HENT: Negative.  Negative for neck pain.   Eyes: Negative.  Negative for blurred vision.  Respiratory: Negative for cough, chest tightness, shortness of breath, wheezing and stridor.   Cardiovascular: Negative.  Negative for chest pain, palpitations, orthopnea and PND.  Gastrointestinal: Negative.   Genitourinary: Negative.   Musculoskeletal: Negative.   Skin: Negative.   Neurological: Negative.  Negative for headaches.  Hematological: Negative.   Psychiatric/Behavioral: Negative.  Negative for suicidal ideas, disturbed wake/sleep cycle, dysphoric mood, decreased concentration and agitation. The patient is not nervous/anxious.        Objective:   Physical Exam  Vitals reviewed. Constitutional: She is oriented to person, place, and time. She appears well-developed and well-nourished. No distress.  HENT:  Head: Normocephalic and atraumatic.  Mouth/Throat: Oropharynx is clear and moist. No oropharyngeal exudate.  Eyes: Conjunctivae are normal. Right eye exhibits no discharge. Left eye exhibits no discharge. No scleral icterus.  Neck: Normal range of  motion. Neck supple. No JVD present. No tracheal deviation present. No thyromegaly present.  Cardiovascular: Normal rate, regular rhythm, normal heart sounds and intact distal pulses.  Exam reveals no gallop and no friction rub.   No murmur heard. Pulmonary/Chest: Effort normal and breath sounds normal. No stridor. No respiratory distress. She has no wheezes. She has no rales. She exhibits no tenderness.  Abdominal: Soft. Bowel sounds are normal. She exhibits no distension and no mass. There is no tenderness. There is no rebound and no guarding.  Musculoskeletal: Normal range of motion. She exhibits no edema and no tenderness.  Lymphadenopathy:    She has no cervical adenopathy.  Neurological: She is oriented to person, place, and time.  Skin: Skin is warm and dry. No rash noted. She is not diaphoretic. No erythema. No pallor.  Psychiatric: She has a normal mood and affect. Her behavior is normal. Judgment and thought content normal.      Lab Results  Component Value Date   WBC 10.9* 09/30/2011   HGB 14.2 09/30/2011   HCT 43.2 09/30/2011   PLT 307.0 09/30/2011   GLUCOSE 84 09/30/2011   CHOL 210* 09/30/2011   TRIG 179.0* 09/30/2011   HDL 44.20 09/30/2011   LDLDIRECT 147.4 09/30/2011   ALT 22 09/30/2011   AST 15 09/30/2011   NA 140 09/30/2011   K 3.9 09/30/2011   CL 106 09/30/2011   CREATININE 0.7 09/30/2011   BUN 11 09/30/2011   CO2 27 09/30/2011   TSH 0.28* 09/30/2011   HGBA1C 5.6 09/30/2011      Assessment & Plan:

## 2011-12-02 NOTE — Assessment & Plan Note (Signed)
She tells me that her A1C=5.1 with Dr. Talmage Nap yesterday

## 2011-12-02 NOTE — Assessment & Plan Note (Signed)
She tells me that she saw Dr. Talmage Nap yesterday and that her TSh was normal

## 2011-12-29 ENCOUNTER — Encounter: Payer: 59 | Attending: Endocrinology | Admitting: Dietician

## 2011-12-29 ENCOUNTER — Encounter: Payer: Self-pay | Admitting: Dietician

## 2012-01-04 NOTE — Progress Notes (Signed)
  Patient was seen on 12/29/2011 for their 3 month follow-up as a part of the diabetes self-management courses at the [facility]. The following learning objectives were met by your patient during this course:  Patient self reports the following:  Diabetes control has improved since diabetes self-management training: Has pre-diabetes but feels more comfortable managing her health. Number of days blood glucose is >200: none, currently not checking. Last MD appointment for diabetes: June Changes in treatment plan: none Confidence with ability to manage diabetes: Confident Areas for improvement with diabetes self-care: Continue to loose weight and be more healthy.  Has lost 16 lb since her first DM Core Class Willingness to participate in diabetes support group: Not at the present.  Please see Diabetes Flow sheet for findings related to patient's self-care.  Follow-Up Plan: Patient is eligible for a "free" 30 minute diabetes self-care appointment in the next year. Patient to call and schedule as needed.

## 2012-01-04 NOTE — Progress Notes (Deleted)
Dear Dr. ***,  Your patient was seen on *** for their 3 month follow-up as a part of the diabetes self-management courses at the [facility]. The following learning objectives were met by your patient during this course:  Please see Diabetes Flow sheet for findings related to patient's self-care.  Patient self reports the following:  (**Delete this: Copy and paste the following from Progress Note)  Diabetes control has improved since diabetes self-management training: *** Number of days blood glucose is >200: *** Last MD appointment for diabetes: *** Changes in treatment plan: *** Confidence with ability to manage diabetes: *** Areas for improvement with diabetes self-care: *** Willingness to participate in diabetes support group: ***   Follow-Up Plan: Patient is eligible for a "free" 30 minute diabetes self-care appointment in the next year. Patient to call and schedule as needed.

## 2012-01-21 ENCOUNTER — Encounter: Payer: Self-pay | Admitting: Internal Medicine

## 2012-01-21 ENCOUNTER — Other Ambulatory Visit (INDEPENDENT_AMBULATORY_CARE_PROVIDER_SITE_OTHER): Payer: 59

## 2012-01-21 ENCOUNTER — Ambulatory Visit (INDEPENDENT_AMBULATORY_CARE_PROVIDER_SITE_OTHER): Payer: 59 | Admitting: Internal Medicine

## 2012-01-21 VITALS — BP 104/78 | HR 78 | Temp 97.6°F | Ht 62.0 in | Wt 226.5 lb

## 2012-01-21 DIAGNOSIS — R102 Pelvic and perineal pain unspecified side: Secondary | ICD-10-CM | POA: Insufficient documentation

## 2012-01-21 DIAGNOSIS — R109 Unspecified abdominal pain: Secondary | ICD-10-CM | POA: Insufficient documentation

## 2012-01-21 DIAGNOSIS — R350 Frequency of micturition: Secondary | ICD-10-CM | POA: Insufficient documentation

## 2012-01-21 DIAGNOSIS — N949 Unspecified condition associated with female genital organs and menstrual cycle: Secondary | ICD-10-CM

## 2012-01-21 LAB — URINALYSIS, ROUTINE W REFLEX MICROSCOPIC
Bilirubin Urine: NEGATIVE
Ketones, ur: NEGATIVE
Leukocytes, UA: NEGATIVE
Specific Gravity, Urine: 1.02 (ref 1.000–1.030)
Total Protein, Urine: NEGATIVE
Urine Glucose: NEGATIVE
pH: 6 (ref 5.0–8.0)

## 2012-01-21 MED ORDER — SOLIFENACIN SUCCINATE 5 MG PO TABS: 5.0000 mg | ORAL_TABLET | Freq: Every day | ORAL | Status: DC

## 2012-01-21 NOTE — Assessment & Plan Note (Signed)
Suspect primarily OAB or IC, but to check ovary with transvag U/S, consider GYn for further eval if not improved

## 2012-01-21 NOTE — Progress Notes (Signed)
Subjective:    Patient ID: Holly Mcmillan, female    DOB: 1970/09/13, 41 y.o.   MRN: [id number]  HPI  Here after tx for ? UTI last wk after visit to UC with elev WBC abd dysuria, tx for 7 day antibx - cephalexin, and now 7 days after finished no better with persistent pain;  There is also ? Of mittleshmerz with ovulation, also concerned about 2 knots to the abd wall to the abd wall for 4 wks, not sure how it relates.  Last saw Dr Neal/GYN may 2013;  S/p endometrial ablation/no further menses; no known fibroids; no hx of STD's, pt doubts STD now, and s/p BTL.  Still has 2 ovaries, she wonders about ovary cyst but has not been a problem in the past, and persitent pain makes her wonder about that.  Has increased freq of urination, but not actually pain with urinate, but severe pain with urge to urinate. No fever, has some recurring pain to the back - ? More than previous, not sure about radiation.  No hx of pelvic pain, surgury in the past. S/p c-section x 2. Not constipated.  No hx of OAB or interstitial cystitis. Sees Dr Lurene Shadow for low thyroid s/p thyroid surgury. Low abd pain worse to lie on left or right side, better to lie flat.  Urinates about 5-6 times per day, 1-2 times at night.   Past Medical History  Diagnosis Date  . Depression   . Headache   . Hypertension   . Ovarian cyst   . Dysmenorrhea   . Obesity   . Diabetes mellitus 05/2011    Dr. Dorisann Frames   Past Surgical History  Procedure Date  . Nasal sinus surgery   . Cesarean section 2002, 2009  . Thyroid surgery 2012  . Cholecystectomy 2005  . Ablation     reports that she has been smoking.  She does not have any smokeless tobacco history on file. She reports that she does not drink alcohol or use illicit drugs. family history includes Alcohol abuse in her other; Arthritis in her other; Cancer in her other; Heart disease in her other; Hypertension in her other; and Kidney disease in her other. Allergies  Allergen Reactions  .  Spironolactone     Hair loss  . Sulfonamide Derivatives     REACTION: Hives   Current Outpatient Prescriptions on File Prior to Visit  Medication Sig Dispense Refill  . BIOTIN PO Take 1 capsule by mouth daily.      Marland Kitchen buPROPion (WELLBUTRIN XL) 300 MG 24 hr tablet Take 1 tablet (300 mg total) by mouth daily.  90 tablet  3  . levothyroxine (SYNTHROID, LEVOTHROID) 175 MCG tablet Take 175 mcg by mouth daily.      . valsartan-hydrochlorothiazide (DIOVAN-HCT) 160-12.5 MG per tablet Take 1 tablet by mouth daily.  90 tablet  3  . solifenacin (VESICARE) 5 MG tablet Take 1 tablet (5 mg total) by mouth daily.  90 tablet  3   Review of Systems  Constitutional: Negative for diaphoresis and unexpected weight change.  HENT: Negative for tinnitus.   Eyes: Negative for photophobia and visual disturbance.  Respiratory: Negative for choking and stridor.   Gastrointestinal: Negative for vomiting and blood in stool.   Musculoskeletal: Negative for gait problem.  Skin: Negative for color change and wound.  Neurological: Negative for tremors and numbness.  Psychiatric/Behavioral: Negative for decreased concentration. The patient is not hyperactive.      Objective:  Physical Exam BP 104/78  Pulse 78  Temp 97.6 F (36.4 C) (Oral)  Ht 5\' 2"  (1.575 m)  Wt 226 lb 8 oz (102.74 kg)  BMI 41.43 kg/m2  SpO2 97% Physical Exam  VS noted Constitutional: Pt appears well-developed and well-nourished.  HENT: Head: Normocephalic.  Right Ear: External ear normal.  Left Ear: External ear normal.  Eyes: Conjunctivae and EOM are normal. Pupils are equal, round, and reactive to light.  Neck: Normal range of motion. Neck supple.  Cardiovascular: Normal rate and regular rhythm.   Pulmonary/Chest: Effort normal and breath sounds normal.  Abd:  Soft, NT, non-distended, + BS Neurological: Pt is alert. Not confused  Skin: Skin is warm. No erythema. Unable to palpate any subq nodule or reducible hernia with pt lying or  standing Psychiatric: Pt behavior is normal. Thought content normal. 1-2+ nervous    Assessment & Plan:

## 2012-01-21 NOTE — Patient Instructions (Addendum)
Please take HALF of the 10 mg vesicare samples until done ; if helps you have the prescriptoin Continue all other medications as before Please go to LAB in the Basement for the urine tests to be done today You will be contacted regarding the referral for: pelvic ultrasound If testing is negative and you problem persists, I would see urology, and consider GYN re-evaluation as well

## 2012-01-21 NOTE — Assessment & Plan Note (Signed)
Possible OAB - for urine studies, as well as vesicare 5 mg trial (gave 6 wk samples);; consider urology if not improved with pelvic pain and r/o interstitial cystitis

## 2012-01-21 NOTE — Assessment & Plan Note (Signed)
Localized mid-abd, hx suspicious for ventral hernia type abnormality but exam benign currently, no current pain, hold on CT at this time

## 2012-01-23 ENCOUNTER — Encounter: Payer: Self-pay | Admitting: Internal Medicine

## 2012-01-23 LAB — URINE CULTURE

## 2012-01-25 ENCOUNTER — Telehealth: Payer: Self-pay | Admitting: Internal Medicine

## 2012-01-25 DIAGNOSIS — R102 Pelvic and perineal pain: Secondary | ICD-10-CM

## 2012-01-25 NOTE — Telephone Encounter (Signed)
Pelvic u/s added

## 2012-01-28 ENCOUNTER — Ambulatory Visit
Admission: RE | Admit: 2012-01-28 | Discharge: 2012-01-28 | Disposition: A | Discharge status: Home / Self Care | Payer: 59 | Source: Ambulatory Visit | Attending: Internal Medicine | Admitting: Internal Medicine

## 2012-01-28 DIAGNOSIS — R102 Pelvic and perineal pain: Secondary | ICD-10-CM

## 2012-01-29 ENCOUNTER — Encounter: Payer: Self-pay | Admitting: Internal Medicine

## 2012-07-11 ENCOUNTER — Other Ambulatory Visit (HOSPITAL_COMMUNITY): Payer: Self-pay | Admitting: Specialist

## 2012-07-11 DIAGNOSIS — R102 Pelvic and perineal pain: Secondary | ICD-10-CM

## 2012-07-28 ENCOUNTER — Other Ambulatory Visit (HOSPITAL_COMMUNITY): Payer: Self-pay | Admitting: Obstetrics & Gynecology

## 2012-07-28 ENCOUNTER — Encounter (HOSPITAL_COMMUNITY): Payer: Self-pay

## 2012-07-28 ENCOUNTER — Ambulatory Visit (HOSPITAL_COMMUNITY)
Admission: RE | Admit: 2012-07-28 | Discharge: 2012-07-28 | Disposition: A | Discharge status: Home / Self Care | Payer: 59 | Source: Ambulatory Visit | Attending: Specialist | Admitting: Specialist

## 2012-07-28 DIAGNOSIS — R102 Pelvic and perineal pain: Secondary | ICD-10-CM

## 2012-07-28 DIAGNOSIS — K5730 Diverticulosis of large intestine without perforation or abscess without bleeding: Secondary | ICD-10-CM | POA: Insufficient documentation

## 2012-07-28 DIAGNOSIS — N949 Unspecified condition associated with female genital organs and menstrual cycle: Secondary | ICD-10-CM | POA: Insufficient documentation

## 2012-07-28 DIAGNOSIS — Z9889 Other specified postprocedural states: Secondary | ICD-10-CM | POA: Insufficient documentation

## 2012-07-28 DIAGNOSIS — I7090 Unspecified atherosclerosis: Secondary | ICD-10-CM | POA: Insufficient documentation

## 2012-07-28 MED ORDER — IOHEXOL 300 MG/ML  SOLN
100.0000 mL | Freq: Once | INTRAMUSCULAR | Status: AC | PRN
  Administered 2012-07-28: 100 mL via INTRAVENOUS

## 2012-08-05 ENCOUNTER — Encounter: Payer: Self-pay | Admitting: Internal Medicine

## 2012-09-01 ENCOUNTER — Ambulatory Visit (INDEPENDENT_AMBULATORY_CARE_PROVIDER_SITE_OTHER): Payer: 59 | Admitting: Internal Medicine

## 2012-09-01 ENCOUNTER — Encounter: Payer: Self-pay | Admitting: Internal Medicine

## 2012-09-01 VITALS — BP 138/88 | HR 80 | Ht 62.0 in | Wt 217.2 lb

## 2012-09-01 DIAGNOSIS — R197 Diarrhea, unspecified: Secondary | ICD-10-CM

## 2012-09-01 DIAGNOSIS — K5730 Diverticulosis of large intestine without perforation or abscess without bleeding: Secondary | ICD-10-CM

## 2012-09-01 DIAGNOSIS — R1084 Generalized abdominal pain: Secondary | ICD-10-CM

## 2012-09-01 MED ORDER — MOVIPREP 100 G PO SOLR: 1.0000 | Freq: Once | ORAL | Status: DC

## 2012-09-01 NOTE — Patient Instructions (Addendum)
You have been given a separate informational sheet regarding your tobacco use, the importance of quitting and local resources to help you quit. You have been scheduled for a colonoscopy with propofol. Please follow written instructions given to you at your visit today.  Please pick up your prep kit at the pharmacy within the next 1-3 days. If you use inhalers (even only as needed), please bring them with you on the day of your procedure. Your physician has requested that you go to www.startemmi.com and enter the access code given to you at your visit today. This web site gives a general overview about your procedure. However, you should still follow specific instructions given to you by our office regarding your preparation for the procedure. 

## 2012-09-01 NOTE — Progress Notes (Signed)
HISTORY OF PRESENT ILLNESS:  Holly Mcmillan is a 42 y.o. female with hypertension, hypothyroidism, morbid obesity, and prior cholecystectomy. She sent today by her gynecology team for GI evaluation prior to laparoscopy for chronic recurrent pelvic pain. The patient denies prior history of GI problems or GI evaluations. Patient reports chronic problems with lower abdominal discomfort related to her menstrual cycle. Symptoms became prominent around September 2013. The discomfort is most significant in the right lower quadrant with radiation to the right groin region. Last discomfort in the left lower quadrant. Pain will last proximally 3 days, then resolve. There is no association between meals or bowel movements and pain. She has lost 35 pounds over the past year. She attributes this to exercise. She rarely has loose bowels. Occasional bloating. No bleeding. CT scan obtained March 2014 revealed post cholecystectomy, colonic diverticulosis, but no acute findings. Has vaginal ultrasound last September and equivocal findings. She tells me that current concerns include possible endometriosis. A family history of colon cancer  REVIEW OF SYSTEMS:  All non-GI ROS negative except for headaches, menstrual pain, excessive urination  Past Medical History  Diagnosis Date  . Depression   . Headache   . Hypertension   . Ovarian cyst   . Dysmenorrhea   . Obesity   . Diabetes mellitus 05/2011    Dr. Dorisann Frames  . Hypothyroidism   . Gallbladder disease     Past Surgical History  Procedure Laterality Date  . Nasal sinus surgery    . Cesarean section  2002, 2009  . Thyroid surgery  2012  . Cholecystectomy  2005  . Ablation      Social History Holly Mcmillan  reports that she has been smoking Cigarettes.  She has a 6 pack-year smoking history. She has never used smokeless tobacco. She reports that she does not drink alcohol or use illicit drugs.  family history includes Alcohol abuse in her maternal  uncle and paternal uncle; Arthritis in her father; Cancer in her other; Cervical cancer in her mother; Colon cancer in her maternal aunt; Heart disease in her maternal grandfather and mother; Hypertension in her father and mother; Leukemia in her mother; Lung cancer in her maternal uncle; and Pancreatic cancer in her maternal aunt.  Allergies  Allergen Reactions  . Spironolactone     Hair loss  . Sulfonamide Derivatives     REACTION: Hives       PHYSICAL EXAMINATION: Vital signs: BP 138/88  Pulse 80  Ht 5\' 2"  (1.575 m)  Wt 217 lb 4 oz (98.544 kg)  BMI 39.73 kg/m2  Constitutional: generally well-appearing, no acute distress Psychiatric: alert and oriented x3, cooperative Eyes: extraocular movements intact, anicteric, conjunctiva pink Mouth: oral pharynx moist, no lesions Neck: supple no lymphadenopathy Cardiovascular: heart regular rate and rhythm, no murmur Lungs: clear to auscultation bilaterally Abdomen: soft, obese, nontender, nondistended, no obvious ascites, no peritoneal signs, normal bowel sounds, no organomegaly Rectal: Deferred until colonoscopy Extremities: no lower extremity edema bilaterally Skin: no lesions on visible extremities Neuro: No focal deficits.   ASSESSMENT:  #1. Recurrent lower abdominal/pelvic pain. Right lower quadrant greater than left lower quadrant. Anticipated laparoscopy. Colonoscopy desired preop. She does have mild colonic diverticulosis by CT scan. Occasional loose bowels, though nothing worrisome. Weight loss is presumed to be intentional   PLAN:  #1. Colonoscopy to rule out intracolonic lesion to explain symptoms.The nature of the procedure, as well as the risks, benefits, and alternatives were carefully and thoroughly reviewed with the patient.  Ample time for discussion and questions allowed. The patient understood, was satisfied, and agreed to proceed. Movi prep prescribed. The patient instructed on its use. #2. Colonoscopy unremarkable,  then proceed with plans per GYN. Apparently anticipating laparoscopy in a few weeks.

## 2012-09-02 ENCOUNTER — Encounter: Payer: Self-pay | Admitting: Internal Medicine

## 2012-09-13 ENCOUNTER — Encounter: Payer: Self-pay | Admitting: Internal Medicine

## 2012-09-13 ENCOUNTER — Ambulatory Visit (AMBULATORY_SURGERY_CENTER): Payer: 59 | Admitting: Internal Medicine

## 2012-09-13 VITALS — BP 134/74 | HR 69 | Temp 98.3°F | Resp 21 | Ht 62.0 in | Wt 217.0 lb

## 2012-09-13 DIAGNOSIS — D126 Benign neoplasm of colon, unspecified: Secondary | ICD-10-CM

## 2012-09-13 DIAGNOSIS — R197 Diarrhea, unspecified: Secondary | ICD-10-CM

## 2012-09-13 DIAGNOSIS — K5730 Diverticulosis of large intestine without perforation or abscess without bleeding: Secondary | ICD-10-CM

## 2012-09-13 DIAGNOSIS — R1084 Generalized abdominal pain: Secondary | ICD-10-CM

## 2012-09-13 MED ORDER — SODIUM CHLORIDE 0.9 % IV SOLN: 500.0000 mL | INTRAVENOUS | Status: DC

## 2012-09-13 NOTE — Op Note (Signed)
[facility] [address] [place], [address]   COLONOSCOPY PROCEDURE REPORT  PATIENT: Holly Mcmillan, Holly Mcmillan  MR#: [id number] BIRTHDATE: 1970/08/26 , 42  yrs. old GENDER: Female ENDOSCOPIST: Roxy Cedar, MD REFERRED GE:[id number] Neal, M.D. PROCEDURE DATE:  09/13/2012 PROCEDURE:   Colonoscopy with snare polypectomy ASA CLASS:   Class II INDICATIONS:Abdominal / pelvic pain. MEDICATIONS: MAC sedation, administered by CRNA and propofol (Diprivan) 400mg  IV  DESCRIPTION OF PROCEDURE:   After the risks benefits and alternatives of the procedure were thoroughly explained, informed consent was obtained.  A digital rectal exam revealed no abnormalities of the rectum.   The LB CF-[id number]  endoscope was introduced through the anus and advanced to the cecum, which was identified by both the appendix and ileocecal valve. No adverse events experienced.   The quality of the prep was excellent, using MoviPrep  The instrument was then slowly withdrawn as the colon was fully examined.      COLON FINDINGS: Three polyps ranging between 5-9mm in size were found in the ascending (2 sessile) and descending (pedunculated) colon.  A polypectomy was performed with a cold snare.  The resection was complete and the polyp tissue was completely retrieved.   Moderate diverticulosis was noted in the sigmoid colon.   The colon mucosa was otherwise normal.  Retroflexed views revealed internal hemorrhoids. The time to cecum=2 minutes 39 seconds.  Withdrawal time=10 minutes 21 seconds.  The scope was withdrawn and the procedure completed. COMPLICATIONS: There were no complications.  ENDOSCOPIC IMPRESSION: 1.   Three polyps ranging between 5-88mm in size were found in the ascending colon and descending colon; polypectomy was performed with a cold snare 2.   Moderate diverticulosis was noted in the sigmoid colon 3.   The colon mucosa was otherwise normal  RECOMMENDATIONS: 1. Repeat  Colonoscopy in 3 years if all polyps adenomatous; otherwise 5 years.   eSigned:  Roxy Cedar, MD 09/13/2012 11:14 AM  cc: Varney Baas, MD, Etta Grandchild, MD, and The Patient   PATIENT NAME:  Holly Mcmillan, Holly Mcmillan MR#: [id number]

## 2012-09-13 NOTE — Patient Instructions (Addendum)
Discharge instructions given with verbal understanding. Handouts on polyps,diverticulosis and hemorrhoids given. Resume previous medications. YOU HAD AN ENDOSCOPIC PROCEDURE TODAY AT THE [facility]: Refer to the procedure report that was given to you for any specific questions about what was found during the examination.  If the procedure report does not answer your questions, please call your gastroenterologist to clarify.  If you requested that your care partner not be given the details of your procedure findings, then the procedure report has been included in a sealed envelope for you to review at your convenience later.  YOU SHOULD EXPECT: Some feelings of bloating in the abdomen. Passage of more gas than usual.  Walking can help get rid of the air that was put into your GI tract during the procedure and reduce the bloating. If you had a lower endoscopy (such as a colonoscopy or flexible sigmoidoscopy) you may notice spotting of blood in your stool or on the toilet paper. If you underwent a bowel prep for your procedure, then you may not have a normal bowel movement for a few days.  DIET: Your first meal following the procedure should be a light meal and then it is ok to progress to your normal diet.  A half-sandwich or bowl of soup is an example of a good first meal.  Heavy or fried foods are harder to digest and may make you feel nauseous or bloated.  Likewise meals heavy in dairy and vegetables can cause extra gas to form and this can also increase the bloating.  Drink plenty of fluids but you should avoid alcoholic beverages for 24 hours.  ACTIVITY: Your care partner should take you home directly after the procedure.  You should plan to take it easy, moving slowly for the rest of the day.  You can resume normal activity the day after the procedure however you should NOT DRIVE or use heavy machinery for 24 hours (because of the sedation medicines used during the test).    SYMPTOMS TO  REPORT IMMEDIATELY: A gastroenterologist can be reached at any hour.  During normal business hours, 8:30 AM to 5:00 PM Monday through Friday, call [phone].  After hours and on weekends, please call the GI answering service at [phone] who will take a message and have the physician on call contact you.   Following lower endoscopy (colonoscopy or flexible sigmoidoscopy):  Excessive amounts of blood in the stool  Significant tenderness or worsening of abdominal pains  Swelling of the abdomen that is new, acute  Fever of 100F or higher  FOLLOW UP: If any biopsies were taken you will be contacted by phone or by letter within the next 1-3 weeks.  Call your gastroenterologist if you have not heard about the biopsies in 3 weeks.  Our staff will call the home number listed on your records the next business day following your procedure to check on you and address any questions or concerns that you may have at that time regarding the information given to you following your procedure. This is a courtesy call and so if there is no answer at the home number and we have not heard from you through the emergency physician on call, we will assume that you have returned to your regular daily activities without incident.  SIGNATURES/CONFIDENTIALITY: You and/or your care partner have signed paperwork which will be entered into your electronic medical record.  These signatures attest to the fact that that the information above on your After Visit   Summary has been reviewed and is understood.  Full responsibility of the confidentiality of this discharge information lies with you and/or your care-partner.  

## 2012-09-13 NOTE — Progress Notes (Signed)
Called to room to assist during endoscopic procedure.  Patient ID and intended procedure confirmed with present staff. Received instructions for my participation in the procedure from the performing physician.  

## 2012-09-13 NOTE — Progress Notes (Signed)
Patient did not experience any of the following events: a burn prior to discharge; a fall within the facility; wrong site/side/patient/procedure/implant event; or a hospital transfer or hospital admission upon discharge from the facility. (G8907) Patient did not have preoperative order for IV antibiotic SSI prophylaxis. (G8918)  

## 2012-09-14 ENCOUNTER — Telehealth: Payer: Self-pay | Admitting: *Deleted

## 2012-09-14 LAB — HM COLONOSCOPY: HM Colonoscopy: 3

## 2012-09-14 NOTE — Telephone Encounter (Signed)
Left message that we called for f/u 

## 2012-09-19 ENCOUNTER — Encounter: Payer: Self-pay | Admitting: Internal Medicine

## 2012-10-11 ENCOUNTER — Other Ambulatory Visit: Payer: Self-pay | Admitting: Internal Medicine

## 2012-10-14 ENCOUNTER — Encounter: Payer: Self-pay | Admitting: Internal Medicine

## 2012-10-14 ENCOUNTER — Ambulatory Visit (INDEPENDENT_AMBULATORY_CARE_PROVIDER_SITE_OTHER): Payer: 59 | Admitting: Internal Medicine

## 2012-10-14 ENCOUNTER — Other Ambulatory Visit (INDEPENDENT_AMBULATORY_CARE_PROVIDER_SITE_OTHER): Payer: 59

## 2012-10-14 VITALS — BP 130/80 | HR 73 | Temp 98.0°F | Ht 62.0 in | Wt 210.0 lb

## 2012-10-14 DIAGNOSIS — E039 Hypothyroidism, unspecified: Secondary | ICD-10-CM

## 2012-10-14 DIAGNOSIS — F3289 Other specified depressive episodes: Secondary | ICD-10-CM

## 2012-10-14 DIAGNOSIS — I10 Essential (primary) hypertension: Secondary | ICD-10-CM

## 2012-10-14 DIAGNOSIS — D72829 Elevated white blood cell count, unspecified: Secondary | ICD-10-CM

## 2012-10-14 DIAGNOSIS — F329 Major depressive disorder, single episode, unspecified: Secondary | ICD-10-CM

## 2012-10-14 LAB — CBC WITH DIFFERENTIAL/PLATELET
Basophils Relative: 0.3 % (ref 0.0–3.0)
Eosinophils Absolute: 0.1 10*3/uL (ref 0.0–0.7)
Eosinophils Relative: 0.6 % (ref 0.0–5.0)
HCT: 42 % (ref 36.0–46.0)
Hemoglobin: 14.4 g/dL (ref 12.0–15.0)
Lymphs Abs: 3.2 10*3/uL (ref 0.7–4.0)
MCHC: 34.2 g/dL (ref 30.0–36.0)
MCV: 91.5 fl (ref 78.0–100.0)
Monocytes Absolute: 1 10*3/uL (ref 0.1–1.0)
Neutro Abs: 8.9 10*3/uL — ABNORMAL HIGH (ref 1.4–7.7)
RBC: 4.59 Mil/uL (ref 3.87–5.11)
WBC: 13.3 10*3/uL — ABNORMAL HIGH (ref 4.5–10.5)

## 2012-10-14 LAB — COMPREHENSIVE METABOLIC PANEL
AST: 16 U/L (ref 0–37)
Albumin: 4.1 g/dL (ref 3.5–5.2)
Alkaline Phosphatase: 59 U/L (ref 39–117)
BUN: 13 mg/dL (ref 6–23)
Creatinine, Ser: 0.8 mg/dL (ref 0.4–1.2)
Glucose, Bld: 71 mg/dL (ref 70–99)
Potassium: 3.6 mEq/L (ref 3.5–5.1)
Total Bilirubin: 0.7 mg/dL (ref 0.3–1.2)

## 2012-10-14 LAB — LIPID PANEL
HDL: 39.8 mg/dL (ref >39.00)
Triglycerides: 214 mg/dL — ABNORMAL HIGH (ref 0.0–149.0)
VLDL: 42.8 mg/dL — ABNORMAL HIGH (ref 0.0–40.0)

## 2012-10-14 LAB — TSH: TSH: 0.26 u[IU]/mL — ABNORMAL LOW (ref 0.35–5.50)

## 2012-10-14 MED ORDER — BUPROPION HCL ER (XL) 300 MG PO TB24: 300.0000 mg | ORAL_TABLET | Freq: Every day | ORAL | Status: DC

## 2012-10-14 NOTE — Assessment & Plan Note (Signed)
She is doing well on wellbutrin 

## 2012-10-14 NOTE — Patient Instructions (Signed)

## 2012-10-14 NOTE — Progress Notes (Signed)
  Subjective:    Patient ID: Holly Mcmillan, female    DOB: 1971/02/14, 42 y.o.   MRN: [id number]  Hypertension This is a chronic problem. The current episode started more than 1 year ago. The problem has been gradually improving since onset. The problem is controlled. Pertinent negatives include no anxiety, blurred vision, chest pain, headaches, malaise/fatigue, neck pain, orthopnea, palpitations, peripheral edema, PND, shortness of breath or sweats. Past treatments include angiotensin blockers and diuretics. The current treatment provides significant improvement. There are no compliance problems.       Review of Systems  Constitutional: Negative.  Negative for fever, chills, malaise/fatigue, diaphoresis, activity change, appetite change, fatigue and unexpected weight change.  HENT: Negative.  Negative for neck pain.   Eyes: Negative.  Negative for blurred vision.  Respiratory: Negative.  Negative for cough, chest tightness, shortness of breath, wheezing and stridor.   Cardiovascular: Negative.  Negative for chest pain, palpitations, orthopnea, leg swelling and PND.  Gastrointestinal: Negative.  Negative for nausea, abdominal pain, diarrhea and constipation.  Endocrine: Negative.   Genitourinary: Negative.   Musculoskeletal: Negative.   Skin: Negative.   Allergic/Immunologic: Negative.   Neurological: Negative.  Negative for dizziness, tremors, weakness, light-headedness and headaches.  Hematological: Negative.  Negative for adenopathy. Does not bruise/bleed easily.  Psychiatric/Behavioral: Negative.        Objective:   Physical Exam  Vitals reviewed. Constitutional: She is oriented to person, place, and time. She appears well-developed and well-nourished. No distress.  HENT:  Head: Normocephalic and atraumatic.  Mouth/Throat: No oropharyngeal exudate.  Eyes: Conjunctivae are normal. Right eye exhibits no discharge. Left eye exhibits no discharge. No scleral icterus.  Neck: Normal  range of motion. Neck supple. No JVD present. No tracheal deviation present. No thyromegaly present.  Cardiovascular: Normal rate, regular rhythm, normal heart sounds and intact distal pulses.  Exam reveals no gallop and no friction rub.   No murmur heard. Pulmonary/Chest: Effort normal and breath sounds normal. No stridor. No respiratory distress. She has no wheezes. She has no rales. She exhibits no tenderness.  Abdominal: Soft. Bowel sounds are normal. She exhibits no distension and no mass. There is no tenderness. There is no rebound and no guarding.  Musculoskeletal: Normal range of motion. She exhibits no edema and no tenderness.  Lymphadenopathy:    She has no cervical adenopathy.  Neurological: She is oriented to person, place, and time.  Skin: Skin is warm and dry. No rash noted. She is not diaphoretic. No erythema. No pallor.  Psychiatric: She has a normal mood and affect. Her behavior is normal. Judgment and thought content normal.      Lab Results  Component Value Date   WBC 10.9* 09/30/2011   HGB 14.2 09/30/2011   HCT 43.2 09/30/2011   PLT 307.0 09/30/2011   GLUCOSE 84 09/30/2011   CHOL 210* 09/30/2011   TRIG 179.0* 09/30/2011   HDL 44.20 09/30/2011   LDLDIRECT 147.4 09/30/2011   ALT 22 09/30/2011   AST 15 09/30/2011   NA 140 09/30/2011   K 3.9 09/30/2011   CL 106 09/30/2011   CREATININE 0.7 09/30/2011   BUN 11 09/30/2011   CO2 27 09/30/2011   TSH 0.28* 09/30/2011   HGBA1C 5.6 09/30/2011      Assessment & Plan:

## 2012-10-14 NOTE — Assessment & Plan Note (Signed)
Her BP is well controlled Today I will check her lytes and renal function 

## 2012-10-14 NOTE — Assessment & Plan Note (Signed)
I will check her TSh and will adjust the dose if needed

## 2012-10-14 NOTE — Assessment & Plan Note (Signed)
I think this is a normal variant Will recheck her CBC today

## 2012-10-18 ENCOUNTER — Telehealth: Payer: Self-pay

## 2012-10-18 NOTE — Telephone Encounter (Signed)
Error

## 2012-10-21 ENCOUNTER — Telehealth: Payer: Self-pay | Admitting: Internal Medicine

## 2012-10-21 DIAGNOSIS — I10 Essential (primary) hypertension: Secondary | ICD-10-CM

## 2012-10-21 MED ORDER — VALSARTAN-HYDROCHLOROTHIAZIDE 160-12.5 MG PO TABS: 1.0000 | ORAL_TABLET | Freq: Every day | ORAL | Status: DC

## 2012-10-21 NOTE — Telephone Encounter (Signed)
Pt is out of Valsartan hydrochlorothiazide.  She thought is was to be called in last week at her visit.  She uses [facility].

## 2012-10-23 ENCOUNTER — Encounter: Payer: Self-pay | Admitting: Internal Medicine

## 2013-07-02 ENCOUNTER — Emergency Department (HOSPITAL_COMMUNITY): Payer: 59

## 2013-07-02 ENCOUNTER — Emergency Department (HOSPITAL_COMMUNITY)
Admission: EM | Admit: 2013-07-02 | Discharge: 2013-07-02 | Disposition: A | Discharge status: Home / Self Care | Payer: 59 | Attending: Emergency Medicine | Admitting: Emergency Medicine

## 2013-07-02 ENCOUNTER — Encounter (HOSPITAL_COMMUNITY): Payer: Self-pay | Admitting: Emergency Medicine

## 2013-07-02 DIAGNOSIS — E039 Hypothyroidism, unspecified: Secondary | ICD-10-CM | POA: Insufficient documentation

## 2013-07-02 DIAGNOSIS — E669 Obesity, unspecified: Secondary | ICD-10-CM | POA: Insufficient documentation

## 2013-07-02 DIAGNOSIS — Z8742 Personal history of other diseases of the female genital tract: Secondary | ICD-10-CM | POA: Insufficient documentation

## 2013-07-02 DIAGNOSIS — Z8719 Personal history of other diseases of the digestive system: Secondary | ICD-10-CM | POA: Insufficient documentation

## 2013-07-02 DIAGNOSIS — R1031 Right lower quadrant pain: Secondary | ICD-10-CM | POA: Insufficient documentation

## 2013-07-02 DIAGNOSIS — E119 Type 2 diabetes mellitus without complications: Secondary | ICD-10-CM | POA: Insufficient documentation

## 2013-07-02 DIAGNOSIS — R109 Unspecified abdominal pain: Secondary | ICD-10-CM

## 2013-07-02 DIAGNOSIS — F3289 Other specified depressive episodes: Secondary | ICD-10-CM | POA: Insufficient documentation

## 2013-07-02 DIAGNOSIS — R1032 Left lower quadrant pain: Secondary | ICD-10-CM | POA: Insufficient documentation

## 2013-07-02 DIAGNOSIS — Z79899 Other long term (current) drug therapy: Secondary | ICD-10-CM | POA: Insufficient documentation

## 2013-07-02 DIAGNOSIS — I10 Essential (primary) hypertension: Secondary | ICD-10-CM | POA: Insufficient documentation

## 2013-07-02 DIAGNOSIS — R110 Nausea: Secondary | ICD-10-CM | POA: Insufficient documentation

## 2013-07-02 DIAGNOSIS — F17200 Nicotine dependence, unspecified, uncomplicated: Secondary | ICD-10-CM | POA: Insufficient documentation

## 2013-07-02 DIAGNOSIS — F329 Major depressive disorder, single episode, unspecified: Secondary | ICD-10-CM | POA: Insufficient documentation

## 2013-07-02 DIAGNOSIS — Z3202 Encounter for pregnancy test, result negative: Secondary | ICD-10-CM | POA: Insufficient documentation

## 2013-07-02 LAB — URINALYSIS, ROUTINE W REFLEX MICROSCOPIC
BILIRUBIN URINE: NEGATIVE
GLUCOSE, UA: NEGATIVE mg/dL
Hgb urine dipstick: NEGATIVE
Ketones, ur: NEGATIVE mg/dL
Leukocytes, UA: NEGATIVE
Nitrite: NEGATIVE
PROTEIN: NEGATIVE mg/dL
Specific Gravity, Urine: 1.013 (ref 1.005–1.030)
Urobilinogen, UA: 0.2 mg/dL (ref 0.0–1.0)
pH: 6 (ref 5.0–8.0)

## 2013-07-02 LAB — CBC WITH DIFFERENTIAL/PLATELET
BASOS ABS: 0 10*3/uL (ref 0.0–0.1)
BASOS PCT: 0 % (ref 0–1)
Eosinophils Absolute: 0.1 10*3/uL (ref 0.0–0.7)
Eosinophils Relative: 1 % (ref 0–5)
HCT: 42.3 % (ref 36.0–46.0)
Hemoglobin: 14 g/dL (ref 12.0–15.0)
LYMPHS PCT: 21 % (ref 12–46)
Lymphs Abs: 2.5 10*3/uL (ref 0.7–4.0)
MCH: 31.9 pg (ref 26.0–34.0)
MCHC: 33.1 g/dL (ref 30.0–36.0)
MCV: 96.4 fL (ref 78.0–100.0)
Monocytes Absolute: 0.8 10*3/uL (ref 0.1–1.0)
Monocytes Relative: 7 % (ref 3–12)
NEUTROS ABS: 8.5 10*3/uL — AB (ref 1.7–7.7)
NEUTROS PCT: 71 % (ref 43–77)
PLATELETS: 289 10*3/uL (ref 150–400)
RBC: 4.39 MIL/uL (ref 3.87–5.11)
RDW: 13.5 % (ref 11.5–15.5)
WBC: 11.9 10*3/uL — ABNORMAL HIGH (ref 4.0–10.5)

## 2013-07-02 LAB — COMPREHENSIVE METABOLIC PANEL
ALBUMIN: 3.8 g/dL (ref 3.5–5.2)
ALT: 38 U/L — ABNORMAL HIGH (ref 0–35)
AST: 25 U/L (ref 0–37)
Alkaline Phosphatase: 82 U/L (ref 39–117)
BUN: 12 mg/dL (ref 6–23)
CHLORIDE: 101 meq/L (ref 96–112)
CO2: 25 meq/L (ref 19–32)
CREATININE: 0.83 mg/dL (ref 0.50–1.10)
Calcium: 7.6 mg/dL — ABNORMAL LOW (ref 8.4–10.5)
GFR calc Af Amer: >90 mL/min (ref >90)
GFR calc non Af Amer: 85 mL/min — ABNORMAL LOW (ref >90)
Glucose, Bld: 79 mg/dL (ref 70–99)
POTASSIUM: 3.5 meq/L — AB (ref 3.7–5.3)
SODIUM: 140 meq/L (ref 137–147)
Total Bilirubin: 0.2 mg/dL — ABNORMAL LOW (ref 0.3–1.2)
Total Protein: 7.5 g/dL (ref 6.0–8.3)

## 2013-07-02 LAB — PREGNANCY, URINE: PREG TEST UR: NEGATIVE

## 2013-07-02 MED ORDER — PROMETHAZINE HCL 25 MG PO TABS: 25.0000 mg | ORAL_TABLET | Freq: Four times a day (QID) | ORAL | Status: DC | PRN

## 2013-07-02 MED ORDER — HYDROMORPHONE HCL PF 1 MG/ML IJ SOLN
1.0000 mg | Freq: Once | INTRAMUSCULAR | Status: AC
  Administered 2013-07-02: 1 mg via INTRAVENOUS
  Filled 2013-07-02: qty 1

## 2013-07-02 MED ORDER — SODIUM CHLORIDE 0.9 % IV BOLUS (SEPSIS)
1000.0000 mL | Freq: Once | INTRAVENOUS | Status: AC
  Administered 2013-07-02: 1000 mL via INTRAVENOUS

## 2013-07-02 MED ORDER — MORPHINE SULFATE 4 MG/ML IJ SOLN
4.0000 mg | Freq: Once | INTRAMUSCULAR | Status: AC
  Administered 2013-07-02: 4 mg via INTRAVENOUS
  Filled 2013-07-02: qty 1

## 2013-07-02 MED ORDER — OXYCODONE-ACETAMINOPHEN 5-325 MG PO TABS: 1.0000 | ORAL_TABLET | Freq: Four times a day (QID) | ORAL | Status: DC | PRN

## 2013-07-02 MED ORDER — IOHEXOL 300 MG/ML  SOLN
100.0000 mL | Freq: Once | INTRAMUSCULAR | Status: AC | PRN
  Administered 2013-07-02: 100 mL via INTRAVENOUS

## 2013-07-02 MED ORDER — ONDANSETRON HCL 4 MG/2ML IJ SOLN
4.0000 mg | Freq: Once | INTRAMUSCULAR | Status: AC
  Administered 2013-07-02: 4 mg via INTRAVENOUS
  Filled 2013-07-02: qty 2

## 2013-07-02 MED ORDER — IOHEXOL 300 MG/ML  SOLN: 50.0000 mL | Freq: Once | INTRAMUSCULAR | Status: DC | PRN

## 2013-07-02 NOTE — ED Notes (Signed)
Pt reports she is finished drinking oral CT contrast. Notified CT.

## 2013-07-02 NOTE — ED Notes (Signed)
Unable to obtain blood x 1, RN aware

## 2013-07-02 NOTE — ED Provider Notes (Signed)
Medical screening examination/treatment/procedure(s) were performed by non-physician practitioner and as supervising physician I was immediately available for consultation/collaboration.  Richarda Blade, MD 07/02/13 1950

## 2013-07-02 NOTE — ED Notes (Signed)
Pt c/o diffuse lower abd pain that started 4 days ago, intermittent nausea. Denies vomiting/diarrhea. Denies dark tarry stools/blood in stools. Denies blood in urine/increased frequency/burning sensation with urination. Denies change in appetite. Nad, skin warm and dry, resp e/u.

## 2013-07-02 NOTE — ED Notes (Signed)
Pt c/o lower abd pain for past 4 days. She went to [facility] and they did a U/a that was negative and sent pt to ed for further eval. Reports nausea. Denies bowel/bladder changes

## 2013-07-02 NOTE — ED Notes (Signed)
Pt returned from radiology.

## 2013-07-02 NOTE — ED Provider Notes (Signed)
CSN: [id number]     Arrival date & time 07/02/13  1320 History   First MD Initiated Contact with Patient 07/02/13 1444     Chief Complaint  Patient presents with  . Abdominal Pain     (Consider location/radiation/quality/duration/timing/severity/associated sxs/prior Treatment) HPI  Patient presents to the ED with complaints of bilateral lower abdominal pains for 4 days. She was sent here from the UC. They checked her urine there and said it was normal. She no longer has a gall bladder but admits she does not normally get abdominal pains. She has hx of diabetes, hypertension, ovarian cyst, depression, painful menstruation but says none of these pains are familiar to her. She has felt nauseous but has had no other associated symptoms of vomiting, diarrhea, constipation, dysuria, rectal bleeding, vaginal bleeding or discharge. The pain has been constant and is a shooting pain, the pain is progressively getting worse.  Past Medical History  Diagnosis Date  . Depression   . Headache(784.0)   . Hypertension   . Ovarian cyst   . Dysmenorrhea   . Obesity   . Diabetes mellitus 05/2011    Dr. Jacelyn Pi  . Hypothyroidism   . Gallbladder disease    Past Surgical History  Procedure Laterality Date  . Nasal sinus surgery    . Cesarean section  2002, 2009  . Thyroid surgery  2012  . Cholecystectomy  2005  . Ablation     Family History  Problem Relation Age of Onset  . Alcohol abuse Paternal Uncle     x2  . Arthritis Father   . Hypertension Father   . Cancer Other   . Heart disease Mother     CHF, A Fib  . Colon cancer Maternal Aunt   . Alcohol abuse Maternal Uncle     x2  . Hypertension Mother   . Heart disease Maternal Grandfather   . Cervical cancer Mother   . Pancreatic cancer Maternal Aunt   . Lung cancer Maternal Uncle   . Leukemia Mother    History  Substance Use Topics  . Smoking status: Current Every Day Smoker -- 0.30 packs/day for 20 years    Types: Cigarettes   . Smokeless tobacco: Never Used  . Alcohol Use: No   OB History   Grav Para Term Preterm Abortions TAB SAB Ect Mult Living                 Review of Systems  The patient denies anorexia, fever, weight loss,, vision loss, decreased hearing, hoarseness, chest pain, syncope, dyspnea on exertion, peripheral edema, balance deficits, hemoptysis,melena, hematochezia, severe indigestion/heartburn, hematuria, incontinence, genital sores, muscle weakness, suspicious skin lesions, transient blindness, difficulty walking, depression, unusual weight change, abnormal bleeding, enlarged lymph nodes, angioedema, and breast masses.   Allergies  Spironolactone and Sulfonamide derivatives  Home Medications   Current Outpatient Rx  Name  Route  Sig  Dispense  Refill  . buPROPion (WELLBUTRIN XL) 300 MG 24 hr tablet   Oral   Take 1 tablet (300 mg total) by mouth daily.   90 tablet   3   . levothyroxine (SYNTHROID, LEVOTHROID) 175 MCG tablet   Oral   Take 175 mcg by mouth daily.         . valsartan-hydrochlorothiazide (DIOVAN-HCT) 160-12.5 MG per tablet   Oral   Take 1 tablet by mouth daily.   90 tablet   3   . oxyCODONE-acetaminophen (PERCOCET/ROXICET) 5-325 MG per tablet   Oral   Take  1 tablet by mouth every 6 (six) hours as needed for severe pain.   15 tablet   0   . promethazine (PHENERGAN) 25 MG tablet   Oral   Take 1 tablet (25 mg total) by mouth every 6 (six) hours as needed for nausea or vomiting.   30 tablet   0    BP 105/74  Pulse 76  Temp(Src) 97.8 F (36.6 C) (Oral)  Resp 18  SpO2 99%  LMP 06/28/2013 Physical Exam  Nursing note and vitals reviewed. Constitutional: She appears well-developed and well-nourished. No distress.  HENT:  Head: Normocephalic and atraumatic.  Eyes: Pupils are equal, round, and reactive to light.  Neck: Normal range of motion. Neck supple.  Cardiovascular: Normal rate and regular rhythm.   Pulmonary/Chest: Effort normal.  Abdominal:  Soft. Bowel sounds are normal. She exhibits no distension, no fluid wave and no ascites. There is tenderness (mild tendernerness to bilateral abdomen). There is no rigidity, no rebound, no guarding, no CVA tenderness and no tenderness at McBurney's point.  Physical exam limited by body habitus  Neurological: She is alert.  Skin: Skin is warm and dry.     ED Course  Procedures (including critical care time) Labs Review Labs Reviewed  CBC WITH DIFFERENTIAL - Abnormal; Notable for the following:    WBC 11.9 (*)    Neutro Abs 8.5 (*)    All other components within normal limits  COMPREHENSIVE METABOLIC PANEL - Abnormal; Notable for the following:    Potassium 3.5 (*)    Calcium 7.6 (*)    ALT 38 (*)    Total Bilirubin 0.2 (*)    GFR calc non Af Amer 85 (*)    All other components within normal limits  URINALYSIS, ROUTINE W REFLEX MICROSCOPIC  PREGNANCY, URINE   Imaging Review Ct Abdomen Pelvis W Contrast  07/02/2013   CLINICAL DATA:  Diffuse lower abdominal pain for 4 days. Intermittent nausea.  EXAM: CT ABDOMEN AND PELVIS WITH CONTRAST  TECHNIQUE: Multidetector CT imaging of the abdomen and pelvis was performed using the standard protocol following bolus administration of intravenous contrast.  CONTRAST:  169mL OMNIPAQUE IOHEXOL 300 MG/ML  SOLN  COMPARISON:  CT ABD/PELVIS W CM dated 07/28/2012  FINDINGS: Lung Bases: Dependent atelectasis.  Liver:  Normal.  Spleen:  Normal.  Gallbladder:  Surgically absent with clips in the fossa.  Common bile duct:  Normal.  Pancreas:  Normal.  Adrenal glands:  Normal.  Kidneys:  Normal.  Both ureters are within normal limits.  Stomach:  Normal.  Distended with oral contrast.  Small bowel:  Duodenum normal.  Small bowel is within normal limits.  Colon: Normal appendix. No inflammatory changes of colon or obstruction.  Pelvic Genitourinary:  Normal.  Bones: No aggressive osseous lesions. No fracture. S-shaped thoracolumbar scoliosis.  Vasculature: Normal.  Body  Wall: Laxity of the inferior abdominal wall on the right without a hernia.  IMPRESSION: 1. No acute abnormality. 2. Cholecystectomy.   Electronically Signed   By: Dereck Ligas M.D.   On: 07/02/2013 17:04     EKG Interpretation None      MDM   Final diagnoses:  Abdominal pain    Labs and CT scan reassuring in the ED. Patient has had GU pain in the past and a history of ovarian cyst. No gross abnormalities on CT scan of ovaries or uterus. Doubt torsion. Pain easily controlled with pain medication in the ER. Will rx percocet and phenergan, she has a gynecologist she  will follow-up with this week.  43 y.o.Monna Crean Jipson's evaluation in the Emergency Department is complete. It has been determined that no acute conditions requiring further emergency intervention are present at this time. The patient/guardian have been advised of the diagnosis and plan. We have discussed signs and symptoms that warrant return to the ED, such as changes or worsening in symptoms.  Vital signs are stable at discharge. Filed Vitals:   07/02/13 1530  BP: 105/74  Pulse: 76  Temp:   Resp:     Patient/guardian has voiced understanding and agreed to follow-up with the PCP or specialist.     Linus Mako, PA-C 07/02/13 1726

## 2013-07-02 NOTE — Discharge Instructions (Signed)
Abdominal Pain, Women °Abdominal (stomach, pelvic, or belly) pain can be caused by many things. It is important to tell your doctor: °· The location of the pain. °· Does it come and go or is it present all the time? °· Are there things that start the pain (eating certain foods, exercise)? °· Are there other symptoms associated with the pain (fever, nausea, vomiting, diarrhea)? °All of this is helpful to know when trying to find the cause of the pain. °CAUSES  °· Stomach: virus or bacteria infection, or ulcer. °· Intestine: appendicitis (inflamed appendix), regional ileitis (Crohn's disease), ulcerative colitis (inflamed colon), irritable bowel syndrome, diverticulitis (inflamed diverticulum of the colon), or cancer of the stomach or intestine. °· Gallbladder disease or stones in the gallbladder. °· Kidney disease, kidney stones, or infection. °· Pancreas infection or cancer. °· Fibromyalgia (pain disorder). °· Diseases of the female organs: °· Uterus: fibroid (non-cancerous) tumors or infection. °· Fallopian tubes: infection or tubal pregnancy. °· Ovary: cysts or tumors. °· Pelvic adhesions (scar tissue). °· Endometriosis (uterus lining tissue growing in the pelvis and on the pelvic organs). °· Pelvic congestion syndrome (female organs filling up with blood just before the menstrual period). °· Pain with the menstrual period. °· Pain with ovulation (producing an egg). °· Pain with an IUD (intrauterine device, birth control) in the uterus. °· Cancer of the female organs. °· Functional pain (pain not caused by a disease, may improve without treatment). °· Psychological pain. °· Depression. °DIAGNOSIS  °Your doctor will decide the seriousness of your pain by doing an examination. °· Blood tests. °· X-rays. °· Ultrasound. °· CT scan (computed tomography, special type of X-ray). °· MRI (magnetic resonance imaging). °· Cultures, for infection. °· Barium enema (dye inserted in the large intestine, to better view it with  X-rays). °· Colonoscopy (looking in intestine with a lighted tube). °· Laparoscopy (minor surgery, looking in abdomen with a lighted tube). °· Major abdominal exploratory surgery (looking in abdomen with a large incision). °TREATMENT  °The treatment will depend on the cause of the pain.  °· Many cases can be observed and treated at home. °· Over-the-counter medicines recommended by your caregiver. °· Prescription medicine. °· Antibiotics, for infection. °· Birth control pills, for painful periods or for ovulation pain. °· Hormone treatment, for endometriosis. °· Nerve blocking injections. °· Physical therapy. °· Antidepressants. °· Counseling with a psychologist or psychiatrist. °· Minor or major surgery. °HOME CARE INSTRUCTIONS  °· Do not take laxatives, unless directed by your caregiver. °· Take over-the-counter pain medicine only if ordered by your caregiver. Do not take aspirin because it can cause an upset stomach or bleeding. °· Try a clear liquid diet (broth or water) as ordered by your caregiver. Slowly move to a bland diet, as tolerated, if the pain is related to the stomach or intestine. °· Have a thermometer and take your temperature several times a day, and record it. °· Bed rest and sleep, if it helps the pain. °· Avoid sexual intercourse, if it causes pain. °· Avoid stressful situations. °· Keep your follow-up appointments and tests, as your caregiver orders. °· If the pain does not go away with medicine or surgery, you may try: °· Acupuncture. °· Relaxation exercises (yoga, meditation). °· Group therapy. °· Counseling. °SEEK MEDICAL CARE IF:  °· You notice certain foods cause stomach pain. °· Your home care treatment is not helping your pain. °· You need stronger pain medicine. °· You want your IUD removed. °· You feel faint or   lightheaded. °· You develop nausea and vomiting. °· You develop a rash. °· You are having side effects or an allergy to your medicine. °SEEK IMMEDIATE MEDICAL CARE IF:  °· Your  pain does not go away or gets worse. °· You have a fever. °· Your pain is felt only in portions of the abdomen. The right side could possibly be appendicitis. The left lower portion of the abdomen could be colitis or diverticulitis. °· You are passing blood in your stools (bright red or black tarry stools, with or without vomiting). °· You have blood in your urine. °· You develop chills, with or without a fever. °· You pass out. °MAKE SURE YOU:  °· Understand these instructions. °· Will watch your condition. °· Will get help right away if you are not doing well or get worse. °Document Released: 02/15/2007 Document Revised: 07/13/2011 Document Reviewed: 03/07/2009 °[employer]® Patient Information ©2014 [employer]. ° °

## 2013-08-28 ENCOUNTER — Ambulatory Visit (INDEPENDENT_AMBULATORY_CARE_PROVIDER_SITE_OTHER): Payer: 59 | Admitting: Internal Medicine

## 2013-08-28 ENCOUNTER — Other Ambulatory Visit (INDEPENDENT_AMBULATORY_CARE_PROVIDER_SITE_OTHER): Payer: 59

## 2013-08-28 ENCOUNTER — Encounter: Payer: Self-pay | Admitting: Internal Medicine

## 2013-08-28 VITALS — BP 122/78 | HR 77 | Temp 98.1°F | Resp 16 | Ht 62.0 in | Wt 219.0 lb

## 2013-08-28 DIAGNOSIS — R7309 Other abnormal glucose: Secondary | ICD-10-CM

## 2013-08-28 DIAGNOSIS — J329 Chronic sinusitis, unspecified: Secondary | ICD-10-CM | POA: Insufficient documentation

## 2013-08-28 DIAGNOSIS — D72829 Elevated white blood cell count, unspecified: Secondary | ICD-10-CM

## 2013-08-28 DIAGNOSIS — E039 Hypothyroidism, unspecified: Secondary | ICD-10-CM

## 2013-08-28 DIAGNOSIS — J019 Acute sinusitis, unspecified: Secondary | ICD-10-CM

## 2013-08-28 DIAGNOSIS — J3089 Other allergic rhinitis: Secondary | ICD-10-CM

## 2013-08-28 DIAGNOSIS — I10 Essential (primary) hypertension: Secondary | ICD-10-CM

## 2013-08-28 LAB — BASIC METABOLIC PANEL
BUN: 9 mg/dL (ref 6–23)
CHLORIDE: 101 meq/L (ref 96–112)
CO2: 29 meq/L (ref 19–32)
Calcium: 9 mg/dL (ref 8.4–10.5)
Creatinine, Ser: 0.7 mg/dL (ref 0.4–1.2)
GFR: 90.54 mL/min (ref >60.00)
GLUCOSE: 100 mg/dL — AB (ref 70–99)
Potassium: 3.6 mEq/L (ref 3.5–5.1)
SODIUM: 139 meq/L (ref 135–145)

## 2013-08-28 LAB — CBC WITH DIFFERENTIAL/PLATELET
BASOS ABS: 0.1 10*3/uL (ref 0.0–0.1)
BASOS PCT: 0.4 % (ref 0.0–3.0)
EOS ABS: 0.1 10*3/uL (ref 0.0–0.7)
Eosinophils Relative: 0.3 % (ref 0.0–5.0)
HCT: 42.3 % (ref 36.0–46.0)
HEMOGLOBIN: 14.2 g/dL (ref 12.0–15.0)
Lymphocytes Relative: 18.5 % (ref 12.0–46.0)
Lymphs Abs: 3.2 10*3/uL (ref 0.7–4.0)
MCHC: 33.6 g/dL (ref 30.0–36.0)
MCV: 92.5 fl (ref 78.0–100.0)
Monocytes Absolute: 1 10*3/uL (ref 0.1–1.0)
Monocytes Relative: 5.7 % (ref 3.0–12.0)
NEUTROS ABS: 13.2 10*3/uL — AB (ref 1.4–7.7)
Neutrophils Relative %: 75.1 % (ref 43.0–77.0)
Platelets: 329 10*3/uL (ref 150.0–400.0)
RBC: 4.57 Mil/uL (ref 3.87–5.11)
RDW: 13.1 % (ref 11.5–14.6)
WBC: 17.6 10*3/uL — ABNORMAL HIGH (ref 4.5–10.5)

## 2013-08-28 LAB — HEMOGLOBIN A1C: Hgb A1c MFr Bld: 5.1 % (ref 4.6–6.5)

## 2013-08-28 MED ORDER — AMOXICILLIN 875 MG PO TABS: 875.0000 mg | ORAL_TABLET | Freq: Two times a day (BID) | ORAL | Status: DC

## 2013-08-28 MED ORDER — HYDROCODONE-HOMATROPINE 5-1.5 MG/5ML PO SYRP: 5.0000 mL | ORAL_SOLUTION | Freq: Three times a day (TID) | ORAL | Status: DC | PRN

## 2013-08-28 NOTE — Progress Notes (Signed)
Pre visit review using our clinic review tool, if applicable. No additional management support is needed unless otherwise documented below in the visit note. 

## 2013-08-28 NOTE — Patient Instructions (Signed)

## 2013-08-28 NOTE — Progress Notes (Signed)
Subjective:    Patient ID: Holly Mcmillan, female    DOB: 1970/10/19, 43 y.o.   MRN: [id number]  Sinusitis This is a new problem. The current episode started 1 to 4 weeks ago. The problem has been gradually worsening since onset. The fever has been present for less than 1 day. Her pain is at a severity of 0/10. She is experiencing no pain. Associated symptoms include chills, congestion, coughing, sinus pressure and a sore throat. Pertinent negatives include no diaphoresis, ear pain, headaches, hoarse voice, neck pain, shortness of breath, sneezing or swollen glands. Past treatments include oral decongestants. The treatment provided mild relief.      Review of Systems  Constitutional: Positive for chills. Negative for fever, diaphoresis, activity change, appetite change, fatigue and unexpected weight change.  HENT: Positive for congestion, postnasal drip, rhinorrhea, sinus pressure and sore throat. Negative for ear pain, hoarse voice, sneezing and trouble swallowing.   Eyes: Negative.   Respiratory: Positive for cough. Negative for apnea, choking, chest tightness, shortness of breath, wheezing and stridor.   Cardiovascular: Negative.  Negative for chest pain, palpitations and leg swelling.  Gastrointestinal: Negative.  Negative for nausea, vomiting, abdominal pain, diarrhea, constipation and blood in stool.  Endocrine: Negative.   Genitourinary: Negative.   Musculoskeletal: Negative.  Negative for neck pain.  Skin: Negative.   Allergic/Immunologic: Negative.   Neurological: Negative.  Negative for headaches.  Hematological: Negative.  Negative for adenopathy. Does not bruise/bleed easily.  Psychiatric/Behavioral: Negative.        Objective:   Physical Exam  Vitals reviewed. Constitutional: She is oriented to person, place, and time. She appears well-developed and well-nourished. No distress.  HENT:  Head: Normocephalic and atraumatic.  Right Ear: Hearing, tympanic membrane, external  ear and ear canal normal.  Left Ear: Hearing, tympanic membrane, external ear and ear canal normal.  Nose: Mucosal edema and rhinorrhea present. No nasal deformity. Right sinus exhibits maxillary sinus tenderness. Right sinus exhibits no frontal sinus tenderness. Left sinus exhibits maxillary sinus tenderness. Left sinus exhibits no frontal sinus tenderness.  Mouth/Throat: Oropharynx is clear and moist and mucous membranes are normal. Mucous membranes are not pale, not dry and not cyanotic. No oral lesions. No trismus in the jaw. No uvula swelling. No oropharyngeal exudate, posterior oropharyngeal edema, posterior oropharyngeal erythema or tonsillar abscesses.  Eyes: Conjunctivae are normal. Right eye exhibits no discharge. Left eye exhibits no discharge. No scleral icterus.  Neck: Normal range of motion. Neck supple. No JVD present. No tracheal deviation present. No thyromegaly present.  Cardiovascular: Normal rate, regular rhythm, normal heart sounds and intact distal pulses.  Exam reveals no gallop and no friction rub.   No murmur heard. Pulmonary/Chest: Effort normal and breath sounds normal. No stridor. No respiratory distress. She has no wheezes. She has no rales. She exhibits no tenderness.  Abdominal: Soft. Bowel sounds are normal. She exhibits no distension and no mass. There is no tenderness. There is no rebound and no guarding.  Musculoskeletal: Normal range of motion. She exhibits no edema and no tenderness.  Lymphadenopathy:    She has no cervical adenopathy.  Neurological: She is oriented to person, place, and time.  Skin: Skin is warm and dry. No rash noted. She is not diaphoretic. No erythema. No pallor.  Psychiatric: She has a normal mood and affect. Her behavior is normal. Judgment and thought content normal.     Lab Results  Component Value Date   WBC 11.9* 07/02/2013   HGB 14.0 07/02/2013  HCT 42.3 07/02/2013   PLT 289 07/02/2013   GLUCOSE 79 07/02/2013   CHOL 237* 10/14/2012    TRIG 214.0* 10/14/2012   HDL 39.80 10/14/2012   LDLDIRECT 166.4 10/14/2012   ALT 38* 07/02/2013   AST 25 07/02/2013   NA 140 07/02/2013   K 3.5* 07/02/2013   CL 101 07/02/2013   CREATININE 0.83 07/02/2013   BUN 12 07/02/2013   CO2 25 07/02/2013   TSH 0.26* 10/14/2012   HGBA1C 5.6 09/30/2011       Assessment & Plan:

## 2013-08-29 ENCOUNTER — Encounter: Payer: Self-pay | Admitting: Internal Medicine

## 2013-08-29 ENCOUNTER — Telehealth: Payer: Self-pay | Admitting: Internal Medicine

## 2013-08-29 LAB — TSH: TSH: 0.26 u[IU]/mL — ABNORMAL LOW (ref 0.35–5.50)

## 2013-08-29 MED ORDER — LEVOTHYROXINE SODIUM 150 MCG PO TABS: 150.0000 ug | ORAL_TABLET | Freq: Every day | ORAL | Status: DC

## 2013-08-29 NOTE — Assessment & Plan Note (Signed)
Will treat the infection with amoxil and control the symptoms with hycodan

## 2013-08-29 NOTE — Telephone Encounter (Signed)
Relevant patient education assigned to patient using Emmi. ° °

## 2013-08-29 NOTE — Assessment & Plan Note (Signed)
Her TSH is suppressed so I have lowered her synthroid dose

## 2013-08-29 NOTE — Assessment & Plan Note (Signed)
Her BP is well controlled Her lytes and renal function are stable 

## 2013-08-29 NOTE — Assessment & Plan Note (Signed)
This is chronic and stable

## 2013-09-20 ENCOUNTER — Encounter: Payer: Self-pay | Admitting: Internal Medicine

## 2013-09-20 ENCOUNTER — Ambulatory Visit (INDEPENDENT_AMBULATORY_CARE_PROVIDER_SITE_OTHER): Payer: 59 | Admitting: Internal Medicine

## 2013-09-20 DIAGNOSIS — F329 Major depressive disorder, single episode, unspecified: Secondary | ICD-10-CM

## 2013-09-20 DIAGNOSIS — I10 Essential (primary) hypertension: Secondary | ICD-10-CM

## 2013-09-20 DIAGNOSIS — F3289 Other specified depressive episodes: Secondary | ICD-10-CM

## 2013-09-20 MED ORDER — BUPROPION HCL ER (XL) 300 MG PO TB24: 300.0000 mg | ORAL_TABLET | Freq: Every day | ORAL | Status: DC

## 2013-09-20 MED ORDER — PHENTERMINE HCL 37.5 MG PO CAPS
37.5000 mg | ORAL_CAPSULE | ORAL | Status: DC
Start: 2013-09-20 — End: 2014-01-15

## 2013-09-20 NOTE — Progress Notes (Signed)
Pre visit review using our clinic review tool, if applicable. No additional management support is needed unless otherwise documented below in the visit note. 

## 2013-09-20 NOTE — Progress Notes (Signed)
Subjective:    Patient ID: Holly Mcmillan, female    DOB: 1971/04/29, 43 y.o.   MRN: [id number]  HPI Comments: She has been controlling her diet and exercising but wants to try an appetite suppressant to lose weight.  Hypertension This is a chronic problem. The current episode started more than 1 year ago. The problem is unchanged. The problem is controlled. Pertinent negatives include no anxiety, blurred vision, chest pain, headaches, malaise/fatigue, neck pain, orthopnea, palpitations, peripheral edema, PND, shortness of breath or sweats. Agents associated with hypertension include thyroid hormones. Risk factors for coronary artery disease include obesity and smoking/tobacco exposure. Past treatments include diuretics. The current treatment provides significant improvement. There are no compliance problems.  Hypertensive end-organ damage includes a thyroid problem.      Review of Systems  Constitutional: Negative.  Negative for fever, chills, malaise/fatigue, diaphoresis, appetite change and fatigue.  HENT: Negative.   Eyes: Negative.  Negative for blurred vision.  Respiratory: Negative.  Negative for apnea, cough, choking, chest tightness, shortness of breath, wheezing and stridor.   Cardiovascular: Negative.  Negative for chest pain, palpitations, orthopnea, leg swelling and PND.  Gastrointestinal: Negative.  Negative for nausea, vomiting, abdominal pain, diarrhea, constipation and blood in stool.  Endocrine: Negative.   Genitourinary: Negative.   Musculoskeletal: Negative.  Negative for neck pain.  Skin: Negative.   Allergic/Immunologic: Negative.   Neurological: Negative.  Negative for dizziness and headaches.  Hematological: Negative.  Negative for adenopathy. Does not bruise/bleed easily.  Psychiatric/Behavioral: Negative.        Objective:   Physical Exam  Vitals reviewed. Constitutional: She is oriented to person, place, and time. She appears well-developed and  well-nourished. No distress.  HENT:  Head: Normocephalic and atraumatic.  Mouth/Throat: Oropharynx is clear and moist. No oropharyngeal exudate.  Eyes: Conjunctivae are normal. Right eye exhibits no discharge. Left eye exhibits no discharge. No scleral icterus.  Neck: Normal range of motion. Neck supple. No JVD present. No tracheal deviation present. No thyromegaly present.  Cardiovascular: Normal rate, regular rhythm, normal heart sounds and intact distal pulses.  Exam reveals no gallop and no friction rub.   No murmur heard. Pulmonary/Chest: Effort normal and breath sounds normal. No stridor. No respiratory distress. She has no wheezes. She has no rales. She exhibits no tenderness.  Abdominal: Soft. Bowel sounds are normal. She exhibits no distension and no mass. There is no tenderness. There is no rebound and no guarding.  Musculoskeletal: Normal range of motion. She exhibits no edema and no tenderness.  Lymphadenopathy:    She has no cervical adenopathy.  Neurological: She is oriented to person, place, and time.  Skin: Skin is warm and dry. No rash noted. She is not diaphoretic. No erythema. No pallor.  Psychiatric: She has a normal mood and affect. Her behavior is normal. Judgment and thought content normal.      Lab Results  Component Value Date   WBC 17.6* 08/28/2013   HGB 14.2 08/28/2013   HCT 42.3 08/28/2013   PLT 329.0 08/28/2013   GLUCOSE 100* 08/28/2013   CHOL 237* 10/14/2012   TRIG 214.0* 10/14/2012   HDL 39.80 10/14/2012   LDLDIRECT 166.4 10/14/2012   ALT 38* 07/02/2013   AST 25 07/02/2013   NA 139 08/28/2013   K 3.6 08/28/2013   CL 101 08/28/2013   CREATININE 0.7 08/28/2013   BUN 9 08/28/2013   CO2 29 08/28/2013   TSH 0.26* 08/28/2013   HGBA1C 5.1 08/28/2013  Assessment & Plan:

## 2013-09-20 NOTE — Patient Instructions (Signed)

## 2013-09-21 ENCOUNTER — Telehealth: Payer: Self-pay | Admitting: Internal Medicine

## 2013-09-21 ENCOUNTER — Encounter: Payer: Self-pay | Admitting: Internal Medicine

## 2013-09-21 NOTE — Telephone Encounter (Signed)
Patient is calling to see if she can take both her Wellbutrin and Phentermine together. Please advise.

## 2013-09-21 NOTE — Assessment & Plan Note (Signed)
Her BP is well controlled 

## 2013-09-21 NOTE — Telephone Encounter (Signed)
Pt notified//lmovm 

## 2013-09-21 NOTE — Assessment & Plan Note (Signed)
She will try phentermine 

## 2013-09-21 NOTE — Telephone Encounter (Signed)
yes

## 2013-09-23 ENCOUNTER — Other Ambulatory Visit: Payer: Self-pay | Admitting: Internal Medicine

## 2013-11-22 ENCOUNTER — Other Ambulatory Visit: Payer: Self-pay | Admitting: Obstetrics & Gynecology

## 2013-11-22 DIAGNOSIS — N6320 Unspecified lump in the left breast, unspecified quadrant: Secondary | ICD-10-CM

## 2013-11-27 ENCOUNTER — Other Ambulatory Visit: Payer: 59

## 2013-11-28 ENCOUNTER — Ambulatory Visit
Admission: RE | Admit: 2013-11-28 | Discharge: 2013-11-28 | Disposition: A | Discharge status: Home / Self Care | Payer: 59 | Source: Ambulatory Visit | Attending: Obstetrics & Gynecology | Admitting: Obstetrics & Gynecology

## 2013-11-28 DIAGNOSIS — N6320 Unspecified lump in the left breast, unspecified quadrant: Secondary | ICD-10-CM

## 2014-01-15 ENCOUNTER — Encounter: Payer: Self-pay | Admitting: Internal Medicine

## 2014-01-15 ENCOUNTER — Other Ambulatory Visit (INDEPENDENT_AMBULATORY_CARE_PROVIDER_SITE_OTHER): Payer: 59

## 2014-01-15 ENCOUNTER — Ambulatory Visit (INDEPENDENT_AMBULATORY_CARE_PROVIDER_SITE_OTHER): Payer: 59 | Admitting: Internal Medicine

## 2014-01-15 VITALS — BP 118/76 | HR 73 | Temp 98.5°F | Resp 16 | Ht 62.0 in | Wt 217.2 lb

## 2014-01-15 DIAGNOSIS — E039 Hypothyroidism, unspecified: Secondary | ICD-10-CM

## 2014-01-15 DIAGNOSIS — M545 Low back pain, unspecified: Secondary | ICD-10-CM

## 2014-01-15 DIAGNOSIS — F17200 Nicotine dependence, unspecified, uncomplicated: Secondary | ICD-10-CM

## 2014-01-15 DIAGNOSIS — R7309 Other abnormal glucose: Secondary | ICD-10-CM

## 2014-01-15 DIAGNOSIS — Z23 Encounter for immunization: Secondary | ICD-10-CM

## 2014-01-15 DIAGNOSIS — I10 Essential (primary) hypertension: Secondary | ICD-10-CM

## 2014-01-15 DIAGNOSIS — R7989 Other specified abnormal findings of blood chemistry: Secondary | ICD-10-CM

## 2014-01-15 LAB — COMPREHENSIVE METABOLIC PANEL
ALBUMIN: 3.8 g/dL (ref 3.5–5.2)
ALT: 20 U/L (ref 0–35)
AST: 19 U/L (ref 0–37)
Alkaline Phosphatase: 46 U/L (ref 39–117)
BUN: 13 mg/dL (ref 6–23)
CO2: 25 mEq/L (ref 19–32)
Calcium: 8.5 mg/dL (ref 8.4–10.5)
Chloride: 105 mEq/L (ref 96–112)
Creatinine, Ser: 0.9 mg/dL (ref 0.4–1.2)
GFR: 75.99 mL/min (ref >60.00)
Glucose, Bld: 82 mg/dL (ref 70–99)
POTASSIUM: 3.9 meq/L (ref 3.5–5.1)
SODIUM: 140 meq/L (ref 135–145)
TOTAL PROTEIN: 7.4 g/dL (ref 6.0–8.3)
Total Bilirubin: 0.4 mg/dL (ref 0.2–1.2)

## 2014-01-15 LAB — LIPID PANEL
CHOL/HDL RATIO: 5
Cholesterol: 225 mg/dL — ABNORMAL HIGH (ref 0–200)
HDL: 44.9 mg/dL (ref >39.00)
NonHDL: 180.1
Triglycerides: 274 mg/dL — ABNORMAL HIGH (ref 0.0–149.0)
VLDL: 54.8 mg/dL — ABNORMAL HIGH (ref 0.0–40.0)

## 2014-01-15 LAB — HEMOGLOBIN A1C: HEMOGLOBIN A1C: 5.5 % (ref 4.6–6.5)

## 2014-01-15 LAB — LDL CHOLESTEROL, DIRECT: LDL DIRECT: 164.9 mg/dL

## 2014-01-15 LAB — TSH: TSH: 0.28 u[IU]/mL — ABNORMAL LOW (ref 0.35–4.50)

## 2014-01-15 MED ORDER — PHENTERMINE HCL 37.5 MG PO CAPS: 37.5000 mg | ORAL_CAPSULE | ORAL | Status: DC

## 2014-01-15 MED ORDER — LEVOTHYROXINE SODIUM 175 MCG PO TABS: 175.0000 ug | ORAL_TABLET | Freq: Every day | ORAL | Status: DC

## 2014-01-15 NOTE — Patient Instructions (Signed)

## 2014-01-15 NOTE — Progress Notes (Signed)
   Subjective:    Patient ID: Holly Mcmillan, female    DOB: [birth year], 43 y.o.   MRN: [id number]  Thyroid Problem Presents for follow-up visit. Symptoms include fatigue. Patient reports no anxiety, cold intolerance, constipation, depressed mood, diaphoresis, diarrhea, dry skin, hair loss, heat intolerance, hoarse voice, leg swelling, nail problem, palpitations, tremors, visual change, weight gain or weight loss. The symptoms have been worsening. Past treatments include levothyroxine. The treatment provided mild relief.      Review of Systems  Constitutional: Positive for fatigue. Negative for fever, chills, weight loss, weight gain, diaphoresis, appetite change and unexpected weight change.  HENT: Negative.  Negative for hoarse voice.   Eyes: Negative.   Respiratory: Negative.  Negative for apnea, cough, choking, chest tightness, shortness of breath, wheezing and stridor.   Cardiovascular: Negative.  Negative for chest pain, palpitations and leg swelling.  Gastrointestinal: Negative.  Negative for nausea, vomiting, abdominal pain, diarrhea and constipation.  Endocrine: Negative.  Negative for cold intolerance and heat intolerance.  Genitourinary: Negative.   Musculoskeletal: Negative.  Negative for arthralgias, back pain, joint swelling, myalgias and neck stiffness.  Skin: Negative.  Negative for color change and rash.  Allergic/Immunologic: Negative.   Neurological: Negative.  Negative for dizziness, tremors, facial asymmetry, weakness, light-headedness, numbness and headaches.  Hematological: Negative.  Negative for adenopathy. Does not bruise/bleed easily.  Psychiatric/Behavioral: Negative.        Objective:   Physical Exam  Vitals reviewed. Constitutional: She is oriented to person, place, and time. She appears well-developed and well-nourished. No distress.  HENT:  Head: Normocephalic and atraumatic.  Mouth/Throat: Oropharynx is clear and moist. No oropharyngeal exudate.    Eyes: Conjunctivae are normal. Right eye exhibits no discharge. Left eye exhibits no discharge. No scleral icterus.  Neck: Normal range of motion. Neck supple. No JVD present. No tracheal deviation present. No thyromegaly present.  Cardiovascular: Normal rate, regular rhythm, normal heart sounds and intact distal pulses.  Exam reveals no gallop and no friction rub.   No murmur heard. Pulmonary/Chest: Effort normal and breath sounds normal. No stridor. No respiratory distress. She has no wheezes. She has no rales. She exhibits no tenderness.  Abdominal: Soft. Bowel sounds are normal. She exhibits no distension and no mass. There is no tenderness. There is no rebound and no guarding.  Musculoskeletal: Normal range of motion. She exhibits no edema and no tenderness.  Lymphadenopathy:    She has no cervical adenopathy.  Neurological: She is oriented to person, place, and time.  Skin: Skin is warm and dry. No rash noted. She is not diaphoretic. No erythema. No pallor.  Psychiatric: She has a normal mood and affect. Her behavior is normal. Judgment and thought content normal.     Lab Results  Component Value Date   WBC 17.6* 08/28/2013   HGB 14.2 08/28/2013   HCT 42.3 08/28/2013   PLT 329.0 08/28/2013   GLUCOSE 100* 08/28/2013   CHOL 237* 10/14/2012   TRIG 214.0* 10/14/2012   HDL 39.80 10/14/2012   LDLDIRECT 166.4 10/14/2012   ALT 38* 07/02/2013   AST 25 07/02/2013   NA 139 08/28/2013   K 3.6 08/28/2013   CL 101 08/28/2013   CREATININE 0.7 08/28/2013   BUN 9 08/28/2013   CO2 29 08/28/2013   TSH 0.26* 08/28/2013   HGBA1C 5.1 08/28/2013        Assessment & Plan:

## 2014-01-15 NOTE — Progress Notes (Signed)
Pre visit review using our clinic review tool, if applicable. No additional management support is needed unless otherwise documented below in the visit note. 

## 2014-01-16 NOTE — Assessment & Plan Note (Signed)
Her TSH is slightly suppressed so I have lowered her synthroid dose

## 2014-01-16 NOTE — Assessment & Plan Note (Signed)
Her BP is well controlled Lytes and renal function are stable 

## 2014-01-16 NOTE — Assessment & Plan Note (Signed)
She has lost some weight with phentermine Will cont for now

## 2014-04-12 ENCOUNTER — Emergency Department (HOSPITAL_BASED_OUTPATIENT_CLINIC_OR_DEPARTMENT_OTHER): Payer: 59

## 2014-04-12 ENCOUNTER — Encounter (HOSPITAL_BASED_OUTPATIENT_CLINIC_OR_DEPARTMENT_OTHER): Payer: Self-pay | Admitting: Emergency Medicine

## 2014-04-12 ENCOUNTER — Emergency Department (HOSPITAL_BASED_OUTPATIENT_CLINIC_OR_DEPARTMENT_OTHER)
Admission: EM | Admit: 2014-04-12 | Discharge: 2014-04-13 | Disposition: A | Discharge status: Home / Self Care | Payer: 59 | Attending: Emergency Medicine | Admitting: Emergency Medicine

## 2014-04-12 DIAGNOSIS — Z3202 Encounter for pregnancy test, result negative: Secondary | ICD-10-CM | POA: Diagnosis not present

## 2014-04-12 DIAGNOSIS — K469 Unspecified abdominal hernia without obstruction or gangrene: Secondary | ICD-10-CM | POA: Diagnosis not present

## 2014-04-12 DIAGNOSIS — Z8742 Personal history of other diseases of the female genital tract: Secondary | ICD-10-CM | POA: Insufficient documentation

## 2014-04-12 DIAGNOSIS — K439 Ventral hernia without obstruction or gangrene: Secondary | ICD-10-CM

## 2014-04-12 DIAGNOSIS — E669 Obesity, unspecified: Secondary | ICD-10-CM | POA: Diagnosis not present

## 2014-04-12 DIAGNOSIS — I10 Essential (primary) hypertension: Secondary | ICD-10-CM | POA: Diagnosis not present

## 2014-04-12 DIAGNOSIS — Z87891 Personal history of nicotine dependence: Secondary | ICD-10-CM | POA: Insufficient documentation

## 2014-04-12 DIAGNOSIS — R6883 Chills (without fever): Secondary | ICD-10-CM | POA: Insufficient documentation

## 2014-04-12 DIAGNOSIS — Z79899 Other long term (current) drug therapy: Secondary | ICD-10-CM | POA: Insufficient documentation

## 2014-04-12 DIAGNOSIS — F329 Major depressive disorder, single episode, unspecified: Secondary | ICD-10-CM | POA: Insufficient documentation

## 2014-04-12 DIAGNOSIS — G43909 Migraine, unspecified, not intractable, without status migrainosus: Secondary | ICD-10-CM | POA: Insufficient documentation

## 2014-04-12 DIAGNOSIS — E039 Hypothyroidism, unspecified: Secondary | ICD-10-CM | POA: Diagnosis not present

## 2014-04-12 DIAGNOSIS — D72829 Elevated white blood cell count, unspecified: Secondary | ICD-10-CM | POA: Diagnosis not present

## 2014-04-12 DIAGNOSIS — E119 Type 2 diabetes mellitus without complications: Secondary | ICD-10-CM | POA: Diagnosis not present

## 2014-04-12 DIAGNOSIS — R109 Unspecified abdominal pain: Secondary | ICD-10-CM

## 2014-04-12 DIAGNOSIS — Z9049 Acquired absence of other specified parts of digestive tract: Secondary | ICD-10-CM | POA: Diagnosis not present

## 2014-04-12 LAB — URINALYSIS, ROUTINE W REFLEX MICROSCOPIC
BILIRUBIN URINE: NEGATIVE
Glucose, UA: NEGATIVE mg/dL
KETONES UR: NEGATIVE mg/dL
Leukocytes, UA: NEGATIVE
Nitrite: NEGATIVE
PH: 5.5 (ref 5.0–8.0)
Protein, ur: NEGATIVE mg/dL
Specific Gravity, Urine: 1.013 (ref 1.005–1.030)
Urobilinogen, UA: 0.2 mg/dL (ref 0.0–1.0)

## 2014-04-12 LAB — COMPREHENSIVE METABOLIC PANEL
ALK PHOS: 58 U/L (ref 39–117)
ALT: 16 U/L (ref 0–35)
ANION GAP: 15 (ref 5–15)
AST: 18 U/L (ref 0–37)
Albumin: 3.9 g/dL (ref 3.5–5.2)
BILIRUBIN TOTAL: 0.2 mg/dL — AB (ref 0.3–1.2)
BUN: 11 mg/dL (ref 6–23)
CO2: 23 mEq/L (ref 19–32)
Calcium: 9 mg/dL (ref 8.4–10.5)
Chloride: 105 mEq/L (ref 96–112)
Creatinine, Ser: 0.9 mg/dL (ref 0.50–1.10)
GFR, EST AFRICAN AMERICAN: 89 mL/min — AB (ref >90)
GFR, EST NON AFRICAN AMERICAN: 77 mL/min — AB (ref >90)
GLUCOSE: 89 mg/dL (ref 70–99)
Potassium: 4.3 mEq/L (ref 3.7–5.3)
Sodium: 143 mEq/L (ref 137–147)
Total Protein: 7.8 g/dL (ref 6.0–8.3)

## 2014-04-12 LAB — CBC WITH DIFFERENTIAL/PLATELET
Basophils Absolute: 0 10*3/uL (ref 0.0–0.1)
Basophils Relative: 0 % (ref 0–1)
Eosinophils Absolute: 0.1 10*3/uL (ref 0.0–0.7)
Eosinophils Relative: 1 % (ref 0–5)
HEMATOCRIT: 42.4 % (ref 36.0–46.0)
HEMOGLOBIN: 13.6 g/dL (ref 12.0–15.0)
LYMPHS ABS: 3 10*3/uL (ref 0.7–4.0)
LYMPHS PCT: 23 % (ref 12–46)
MCH: 31 pg (ref 26.0–34.0)
MCHC: 32.1 g/dL (ref 30.0–36.0)
MCV: 96.6 fL (ref 78.0–100.0)
MONO ABS: 0.9 10*3/uL (ref 0.1–1.0)
MONOS PCT: 7 % (ref 3–12)
NEUTROS ABS: 9 10*3/uL — AB (ref 1.7–7.7)
Neutrophils Relative %: 69 % (ref 43–77)
Platelets: 351 10*3/uL (ref 150–400)
RBC: 4.39 MIL/uL (ref 3.87–5.11)
RDW: 13.2 % (ref 11.5–15.5)
WBC: 13 10*3/uL — AB (ref 4.0–10.5)

## 2014-04-12 LAB — PREGNANCY, URINE: Preg Test, Ur: NEGATIVE

## 2014-04-12 LAB — URINE MICROSCOPIC-ADD ON

## 2014-04-12 LAB — LIPASE, BLOOD: LIPASE: 27 U/L (ref 11–59)

## 2014-04-12 MED ORDER — IOHEXOL 300 MG/ML  SOLN
25.0000 mL | Freq: Once | INTRAMUSCULAR | Status: AC | PRN
  Administered 2014-04-12: 25 mL via ORAL

## 2014-04-12 MED ORDER — IOHEXOL 300 MG/ML  SOLN
100.0000 mL | Freq: Once | INTRAMUSCULAR | Status: AC | PRN
  Administered 2014-04-12: 100 mL via INTRAVENOUS

## 2014-04-12 MED ORDER — SODIUM CHLORIDE 0.9 % IV SOLN
INTRAVENOUS | Status: DC
  Administered 2014-04-12: 23:00:00 via INTRAVENOUS

## 2014-04-12 MED ORDER — ONDANSETRON HCL 4 MG/2ML IJ SOLN: 4.0000 mg | Freq: Once | INTRAMUSCULAR | Status: DC

## 2014-04-12 MED ORDER — METOCLOPRAMIDE HCL 5 MG/ML IJ SOLN
10.0000 mg | Freq: Once | INTRAMUSCULAR | Status: AC
  Administered 2014-04-12: 10 mg via INTRAVENOUS
  Filled 2014-04-12: qty 2

## 2014-04-12 MED ORDER — FENTANYL CITRATE 0.05 MG/ML IJ SOLN
100.0000 ug | Freq: Once | INTRAMUSCULAR | Status: AC
  Administered 2014-04-12: 100 ug via INTRAVENOUS
  Filled 2014-04-12: qty 2

## 2014-04-12 MED ORDER — DIPHENHYDRAMINE HCL 50 MG/ML IJ SOLN
25.0000 mg | Freq: Once | INTRAMUSCULAR | Status: AC
  Administered 2014-04-12: 25 mg via INTRAVENOUS
  Filled 2014-04-12: qty 1

## 2014-04-12 NOTE — ED Provider Notes (Signed)
CSN: [id number]     Arrival date & time 04/12/14  2137 History  This chart was scribed for Holly Fines, MD by Edison Simon, ED Scribe. This patient was seen in room MH02/MH02 and the patient's care was started at 10:52 PM.    Chief Complaint  Patient presents with  . Abdominal Pain   The history is provided by the patient. No language interpreter was used.    HPI Comments: Holly Mcmillan is a 43 y.o. female who presents to the Emergency Department complaining of 2 day history of migraine. She woke from a nap at 5:00pm with RUQ abdominal pain which she rates at 8./10. She is status post cholecystectomy. She was seen at an [place] walk-in clinic and had lab work that showed a leukocytosis of 13.6. She was sent here for further evaluation. The pain is worse with movement and palpation. Pain in abdomen radiates through to her back. She reports associated chills. She denies fever, nausea, vomiting, diarrhea, dysuria, hematuria, vaginal bleeding or vaginal discharge.  Past Medical History  Diagnosis Date  . Depression   . Headache(784.0)   . Hypertension   . Ovarian cyst   . Dysmenorrhea   . Obesity   . Diabetes mellitus 05/2011    Dr. Jacelyn Pi  . Hypothyroidism   . Gallbladder disease    Past Surgical History  Procedure Laterality Date  . Nasal sinus surgery    . Cesarean section  2002, 2009  . Thyroid surgery  2012  . Cholecystectomy  2005  . Ablation     Family History  Problem Relation Age of Onset  . Alcohol abuse Paternal Uncle     x2  . Arthritis Father   . Hypertension Father   . Cancer Other   . Heart disease Mother     CHF, A Fib  . Colon cancer Maternal Aunt   . Alcohol abuse Maternal Uncle     x2  . Hypertension Mother   . Heart disease Maternal Grandfather   . Cervical cancer Mother   . Pancreatic cancer Maternal Aunt   . Lung cancer Maternal Uncle   . Leukemia Mother    History  Substance Use Topics  . Smoking status: Former Smoker -- 0.30 packs/day  for 20 years    Types: Cigarettes  . Smokeless tobacco: Never Used  . Alcohol Use: No   OB History    No data available     Review of Systems A complete 10 system review of systems was obtained and all systems are negative except as noted in the HPI and PMH.    Allergies  Spironolactone and Sulfonamide derivatives  Home Medications   Prior to Admission medications   Medication Sig Start Date End Date Taking? Authorizing Provider  buPROPion (WELLBUTRIN XL) 300 MG 24 hr tablet Take 1 tablet (300 mg total) by mouth daily. 09/20/13 09/20/14 Yes [name], MD  levothyroxine (SYNTHROID) 175 MCG tablet Take 1 tablet (175 mcg total) by mouth daily before breakfast. 01/15/14  Yes [name], MD  phentermine 37.5 MG capsule Take 1 capsule (37.5 mg total) by mouth every morning. 01/15/14  Yes [name], MD  valsartan-hydrochlorothiazide (DIOVAN-HCT) 160-12.5 MG per tablet TAKE 1 TABLET DAILY   Yes [name], MD   BP 129/73 mmHg  Pulse 62  Temp(Src) 98.1 F (36.7 C) (Oral)  Resp 22  Ht 5\' 2"  (1.575 m)  Wt 210 lb (95.255 kg)  BMI 38.40 kg/m2  SpO2  98% Physical Exam  Nursing note and vitals reviewed.  General: Well-developed, well-nourished female in no acute distress; appearance consistent with age of record HENT: normocephalic; atraumatic Eyes: pupils equal, round and reactive to light; extraocular muscles intact Neck: supple Heart: regular rate and rhythm Lungs: clear to auscultation bilaterally Abdomen: soft; nondistended; no masses or hepatosplenomegaly; bowel sounds present; tenderness most prominent in the periumbilical region Extremities: No deformity; full range of motion; pulses normal Neurologic: Awake, alert and oriented; motor function intact in all extremities and symmetric; no facial droop Skin: Warm and dry Psychiatric: Normal mood and affect   ED Course  Procedures (including critical care time)  DIAGNOSTIC STUDIES: Oxygen Saturation is 100% on  room air, normal by my interpretation.    COORDINATION OF CARE: 10:56 PM Discussed treatment plan with patient at beside, the patient agrees with the plan and has no further questions at this time.  MDM   Nursing notes and vitals signs, including pulse oximetry, reviewed.  Summary of this visit's results, reviewed by myself:  Labs:  Results for orders placed or performed during the hospital encounter of 04/12/14 (from the past 24 hour(s))  Urinalysis, Routine w reflex microscopic     Status: Abnormal   Collection Time: 04/12/14 10:32 PM  Result Value Ref Range   Color, Urine YELLOW YELLOW   APPearance CLEAR CLEAR   Specific Gravity, Urine 1.013 1.005 - 1.030   pH 5.5 5.0 - 8.0   Glucose, UA NEGATIVE NEGATIVE mg/dL   Hgb urine dipstick TRACE (A) NEGATIVE   Bilirubin Urine NEGATIVE NEGATIVE   Ketones, ur NEGATIVE NEGATIVE mg/dL   Protein, ur NEGATIVE NEGATIVE mg/dL   Urobilinogen, UA 0.2 0.0 - 1.0 mg/dL   Nitrite NEGATIVE NEGATIVE   Leukocytes, UA NEGATIVE NEGATIVE  Pregnancy, urine     Status: None   Collection Time: 04/12/14 10:32 PM  Result Value Ref Range   Preg Test, Ur NEGATIVE NEGATIVE  Urine microscopic-add on     Status: Abnormal   Collection Time: 04/12/14 10:32 PM  Result Value Ref Range   Squamous Epithelial / LPF RARE RARE   WBC, UA 0-2 <3 WBC/hpf   RBC / HPF 0-2 <3 RBC/hpf   Bacteria, UA FEW (A) RARE  Comprehensive metabolic panel     Status: Abnormal   Collection Time: 04/12/14 11:00 PM  Result Value Ref Range   Sodium 143 137 - 147 mEq/L   Potassium 4.3 3.7 - 5.3 mEq/L   Chloride 105 96 - 112 mEq/L   CO2 23 19 - 32 mEq/L   Glucose, Bld 89 70 - 99 mg/dL   BUN 11 6 - 23 mg/dL   Creatinine, Ser 0.90 0.50 - 1.10 mg/dL   Calcium 9.0 8.4 - 10.5 mg/dL   Total Protein 7.8 6.0 - 8.3 g/dL   Albumin 3.9 3.5 - 5.2 g/dL   AST 18 0 - 37 U/L   ALT 16 0 - 35 U/L   Alkaline Phosphatase 58 39 - 117 U/L   Total Bilirubin 0.2 (L) 0.3 - 1.2 mg/dL   GFR calc non Af  Amer 77 (L) >90 mL/min   GFR calc Af Amer 89 (L) >90 mL/min   Anion gap 15 5 - 15  Lipase, blood     Status: None   Collection Time: 04/12/14 11:00 PM  Result Value Ref Range   Lipase 27 11 - 59 U/L  CBC with Differential     Status: Abnormal   Collection Time: 04/12/14 11:00 PM  Result Value Ref Range   WBC 13.0 (H) 4.0 - 10.5 K/uL   RBC 4.39 3.87 - 5.11 MIL/uL   Hemoglobin 13.6 12.0 - 15.0 g/dL   HCT 42.4 36.0 - 46.0 %   MCV 96.6 78.0 - 100.0 fL   MCH 31.0 26.0 - 34.0 pg   MCHC 32.1 30.0 - 36.0 g/dL   RDW 13.2 11.5 - 15.5 %   Platelets 351 150 - 400 K/uL   Neutrophils Relative % 69 43 - 77 %   Neutro Abs 9.0 (H) 1.7 - 7.7 K/uL   Lymphocytes Relative 23 12 - 46 %   Lymphs Abs 3.0 0.7 - 4.0 K/uL   Monocytes Relative 7 3 - 12 %   Monocytes Absolute 0.9 0.1 - 1.0 K/uL   Eosinophils Relative 1 0 - 5 %   Eosinophils Absolute 0.1 0.0 - 0.7 K/uL   Basophils Relative 0 0 - 1 %   Basophils Absolute 0.0 0.0 - 0.1 K/uL    Imaging Studies: Ct Abdomen Pelvis W Contrast  04/13/2014   CLINICAL DATA:  Upper abdominal pain for 7 hours.  Nausea.  EXAM: CT ABDOMEN AND PELVIS WITH CONTRAST  TECHNIQUE: Multidetector CT imaging of the abdomen and pelvis was performed using the standard protocol following bolus administration of intravenous contrast.  CONTRAST:  60mL OMNIPAQUE IOHEXOL 300 MG/ML SOLN, 124mL OMNIPAQUE IOHEXOL 300 MG/ML SOLN  COMPARISON:  07/02/2013  FINDINGS: Lung bases are clear.  Surgical absence of the gallbladder. The liver, spleen, pancreas, adrenal glands, kidneys, abdominal aorta, inferior vena cava, and retroperitoneal lymph nodes are unremarkable. Stomach and small bowel are decompressed. Stool-filled colon without abnormal distention. No free air or free fluid in the abdomen.  Pelvis: Appendix is normal. Uterus and ovaries are not enlarged. Bladder wall is not thickened. No free or loculated pelvic fluid collections. No pelvic mass or lymphadenopathy. Anterior inferior abdominal  wall hernia at the right rectus abdominus muscle insertion inferiorly containing fat. No bowel herniation. Mild lumbar scoliosis. No destructive bone lesions.  IMPRESSION: No acute process demonstrated in the abdomen or pelvis that would account for the patient's symptoms. Surgical absence of the gallbladder. Small fat containing inferior anterior abdominal wall hernia. No change since previous study.   Electronically Signed   By: Lucienne Capers M.D.   On: 04/13/2014 00:39   12:55 AM Patient's pain correlates with location of abdominal wall hernia.  I personally performed the services described in this documentation, which was scribed in my presence. The recorded information has been reviewed and is accurate.   Holly Fines, MD 04/13/14 [phone]

## 2014-04-12 NOTE — ED Notes (Signed)
Patient transported to CT via stretcher per tech. 

## 2014-04-12 NOTE — ED Notes (Addendum)
Ambulatory pt sent here from [facility] walk in clinic with RUQ pain for Ultrasound. Reports pain 8/10 feels like a bubble. Denies N/V/D.

## 2014-04-12 NOTE — ED Notes (Signed)
MD at bedside. 

## 2014-04-13 MED ORDER — HYDROCODONE-ACETAMINOPHEN 5-325 MG PO TABS: 1.0000 | ORAL_TABLET | Freq: Four times a day (QID) | ORAL | Status: DC | PRN

## 2014-04-13 NOTE — ED Notes (Signed)
MD at bedside to discuss results of testing. 

## 2014-04-13 NOTE — ED Notes (Signed)
Pt ambulatory to restroom

## 2014-05-22 ENCOUNTER — Encounter: Payer: Self-pay | Admitting: Internal Medicine

## 2014-05-22 ENCOUNTER — Ambulatory Visit (INDEPENDENT_AMBULATORY_CARE_PROVIDER_SITE_OTHER): Payer: 59 | Admitting: Internal Medicine

## 2014-05-22 ENCOUNTER — Other Ambulatory Visit (INDEPENDENT_AMBULATORY_CARE_PROVIDER_SITE_OTHER): Payer: 59

## 2014-05-22 VITALS — BP 112/78 | HR 81 | Temp 97.8°F | Resp 16 | Ht 62.0 in | Wt 222.5 lb

## 2014-05-22 DIAGNOSIS — I10 Essential (primary) hypertension: Secondary | ICD-10-CM

## 2014-05-22 DIAGNOSIS — D72829 Elevated white blood cell count, unspecified: Secondary | ICD-10-CM

## 2014-05-22 DIAGNOSIS — E038 Other specified hypothyroidism: Secondary | ICD-10-CM

## 2014-05-22 DIAGNOSIS — R739 Hyperglycemia, unspecified: Secondary | ICD-10-CM

## 2014-05-22 LAB — CBC WITH DIFFERENTIAL/PLATELET
BASOS ABS: 0 10*3/uL (ref 0.0–0.1)
BASOS PCT: 0.3 % (ref 0.0–3.0)
Eosinophils Absolute: 0.1 10*3/uL (ref 0.0–0.7)
Eosinophils Relative: 0.4 % (ref 0.0–5.0)
HEMATOCRIT: 43.7 % (ref 36.0–46.0)
Hemoglobin: 14.4 g/dL (ref 12.0–15.0)
LYMPHS ABS: 2.6 10*3/uL (ref 0.7–4.0)
LYMPHS PCT: 21.4 % (ref 12.0–46.0)
MCHC: 33 g/dL (ref 30.0–36.0)
MCV: 92.1 fl (ref 78.0–100.0)
Monocytes Absolute: 0.8 10*3/uL (ref 0.1–1.0)
Monocytes Relative: 6.5 % (ref 3.0–12.0)
NEUTROS ABS: 8.6 10*3/uL — AB (ref 1.4–7.7)
Neutrophils Relative %: 71.4 % (ref 43.0–77.0)
Platelets: 349 10*3/uL (ref 150.0–400.0)
RBC: 4.74 Mil/uL (ref 3.87–5.11)
RDW: 12.8 % (ref 11.5–15.5)
WBC: 12.1 10*3/uL — AB (ref 4.0–10.5)

## 2014-05-22 LAB — BASIC METABOLIC PANEL
BUN: 15 mg/dL (ref 6–23)
CO2: 28 meq/L (ref 19–32)
Calcium: 9.2 mg/dL (ref 8.4–10.5)
Chloride: 103 mEq/L (ref 96–112)
Creatinine, Ser: 0.92 mg/dL (ref 0.40–1.20)
GFR: 70.19 mL/min (ref >60.00)
Glucose, Bld: 90 mg/dL (ref 70–99)
Potassium: 4.2 mEq/L (ref 3.5–5.1)
Sodium: 139 mEq/L (ref 135–145)

## 2014-05-22 LAB — TSH: TSH: 0.87 u[IU]/mL (ref 0.35–4.50)

## 2014-05-22 LAB — HEMOGLOBIN A1C: Hgb A1c MFr Bld: 5.5 % (ref 4.6–6.5)

## 2014-05-22 MED ORDER — VALSARTAN-HYDROCHLOROTHIAZIDE 160-12.5 MG PO TABS: 1.0000 | ORAL_TABLET | Freq: Every day | ORAL | Status: DC

## 2014-05-22 MED ORDER — LORCASERIN HCL 10 MG PO TABS: 1.0000 | ORAL_TABLET | Freq: Two times a day (BID) | ORAL | Status: DC

## 2014-05-22 NOTE — Assessment & Plan Note (Signed)
Her TSH is in the normal range She will stay on the current dose 

## 2014-05-22 NOTE — Progress Notes (Signed)
   Subjective:    Patient ID: Holly Mcmillan, female    DOB: [birth year], 44 y.o.   MRN: [id number]  Thyroid Problem Presents for follow-up visit. Symptoms include weight gain. Patient reports no anxiety, cold intolerance, constipation, depressed mood, diaphoresis, diarrhea, dry skin, fatigue, heat intolerance, hoarse voice, leg swelling, nail problem, palpitations, tremors, visual change or weight loss. The symptoms have been worsening. Past treatments include levothyroxine. The treatment provided mild relief. Her past medical history is significant for obesity.      Review of Systems  Constitutional: Positive for weight gain and unexpected weight change. Negative for chills, weight loss, diaphoresis and fatigue.  HENT: Negative.  Negative for hoarse voice.   Eyes: Negative.   Respiratory: Negative.  Negative for cough, choking, chest tightness, shortness of breath and stridor.   Cardiovascular: Negative.  Negative for chest pain, palpitations and leg swelling.  Gastrointestinal: Negative.  Negative for nausea, abdominal pain, diarrhea and constipation.  Endocrine: Negative.  Negative for cold intolerance and heat intolerance.  Genitourinary: Negative.   Musculoskeletal: Negative.  Negative for myalgias, back pain and arthralgias.  Skin: Negative.   Allergic/Immunologic: Negative.   Neurological: Negative.  Negative for tremors.  Hematological: Negative.  Negative for adenopathy. Does not bruise/bleed easily.  Psychiatric/Behavioral: Negative.  The patient is not nervous/anxious.        Objective:   Physical Exam  Constitutional: She is oriented to person, place, and time. She appears well-developed and well-nourished. No distress.  HENT:  Head: Normocephalic and atraumatic.  Mouth/Throat: Oropharynx is clear and moist. No oropharyngeal exudate.  Eyes: Conjunctivae are normal. Right eye exhibits no discharge. Left eye exhibits no discharge. No scleral icterus.  Neck: Normal range of  motion. Neck supple. No JVD present. No tracheal deviation present. No thyromegaly present.  Cardiovascular: Normal rate, regular rhythm, normal heart sounds and intact distal pulses.  Exam reveals no gallop and no friction rub.   No murmur heard. Pulmonary/Chest: Effort normal and breath sounds normal. No stridor. No respiratory distress. She has no wheezes. She has no rales. She exhibits no tenderness.  Abdominal: Soft. Bowel sounds are normal. She exhibits no distension and no mass. There is no tenderness. There is no rebound and no guarding.  Musculoskeletal: Normal range of motion. She exhibits no edema or tenderness.  Lymphadenopathy:    She has no cervical adenopathy.  Neurological: She is oriented to person, place, and time.  Skin: Skin is warm and dry. No rash noted. She is not diaphoretic. No erythema. No pallor.  Vitals reviewed.   Lab Results  Component Value Date   WBC 12.1* 05/22/2014   HGB 14.4 05/22/2014   HCT 43.7 05/22/2014   PLT 349.0 05/22/2014   GLUCOSE 90 05/22/2014   CHOL 225* 01/15/2014   TRIG 274.0* 01/15/2014   HDL 44.90 01/15/2014   LDLDIRECT 164.9 01/15/2014   ALT 16 04/12/2014   AST 18 04/12/2014   NA 139 05/22/2014   K 4.2 05/22/2014   CL 103 05/22/2014   CREATININE 0.92 05/22/2014   BUN 15 05/22/2014   CO2 28 05/22/2014   TSH 0.87 05/22/2014   HGBA1C 5.5 05/22/2014        Assessment & Plan:

## 2014-05-22 NOTE — Assessment & Plan Note (Signed)
Her BP is well controlled Lytes and renal function are stable 

## 2014-05-22 NOTE — Progress Notes (Signed)
Pre visit review using our clinic review tool, if applicable. No additional management support is needed unless otherwise documented below in the visit note. 

## 2014-05-22 NOTE — Assessment & Plan Note (Signed)
WBC remains stable The diff is WNL This is related to her tobacco abuse

## 2014-05-22 NOTE — Patient Instructions (Signed)
Hypothyroidism The thyroid is a large gland located in the lower front of your neck. The thyroid gland helps control metabolism. Metabolism is how your body handles food. It controls metabolism with the hormone thyroxine. When this gland is underactive (hypothyroid), it produces too little hormone.  CAUSES These include:   Absence or destruction of thyroid tissue.  Goiter due to iodine deficiency.  Goiter due to medications.  Congenital defects (since birth).  Problems with the pituitary. This causes a lack of TSH (thyroid stimulating hormone). This hormone tells the thyroid to turn out more hormone. SYMPTOMS  Lethargy (feeling as though you have no energy)  Cold intolerance  Weight gain (in spite of normal food intake)  Dry skin  Coarse hair  Menstrual irregularity (if severe, may lead to infertility)  Slowing of thought processes Cardiac problems are also caused by insufficient amounts of thyroid hormone. Hypothyroidism in the newborn is cretinism, and is an extreme form. It is important that this form be treated adequately and immediately or it will lead rapidly to retarded physical and mental development. DIAGNOSIS  To prove hypothyroidism, your caregiver may do blood tests and ultrasound tests. Sometimes the signs are hidden. It may be necessary for your caregiver to watch this illness with blood tests either before or after diagnosis and treatment. TREATMENT  Low levels of thyroid hormone are increased by using synthetic thyroid hormone. This is a safe, effective treatment. It usually takes about four weeks to gain the full effects of the medication. After you have the full effect of the medication, it will generally take another four weeks for problems to leave. Your caregiver may start you on low doses. If you have had heart problems the dose may be gradually increased. It is generally not an emergency to get rapidly to normal. HOME CARE INSTRUCTIONS   Take your  medications as your caregiver suggests. Let your caregiver know of any medications you are taking or start taking. Your caregiver will help you with dosage schedules.  As your condition improves, your dosage needs may increase. It will be necessary to have continuing blood tests as suggested by your caregiver.  Report all suspected medication side effects to your caregiver. SEEK MEDICAL CARE IF: Seek medical care if you develop:  Sweating.  Tremulousness (tremors).  Anxiety.  Rapid weight loss.  Heat intolerance.  Emotional swings.  Diarrhea.  Weakness. SEEK IMMEDIATE MEDICAL CARE IF:  You develop chest pain, an irregular heart beat (palpitations), or a rapid heart beat. MAKE SURE YOU:   Understand these instructions.  Will watch your condition.  Will get help right away if you are not doing well or get worse. Document Released: 04/20/2005 Document Revised: 07/13/2011 Document Reviewed: 12/09/2007 Exit[employer] Patient Information 2015 [employer]. This information is not intended to replace advice given to you by your health care provider. Make sure you discuss any questions you have with your health care provider.  

## 2014-05-22 NOTE — Assessment & Plan Note (Signed)
Phentermine made her feel "jittery" Will try belviq

## 2014-07-06 ENCOUNTER — Other Ambulatory Visit: Payer: Self-pay | Admitting: Internal Medicine

## 2014-08-02 ENCOUNTER — Ambulatory Visit (INDEPENDENT_AMBULATORY_CARE_PROVIDER_SITE_OTHER): Payer: 59 | Admitting: Internal Medicine

## 2014-08-02 ENCOUNTER — Encounter: Payer: Self-pay | Admitting: Internal Medicine

## 2014-08-02 ENCOUNTER — Other Ambulatory Visit (INDEPENDENT_AMBULATORY_CARE_PROVIDER_SITE_OTHER): Payer: 59

## 2014-08-02 VITALS — BP 112/80 | HR 72 | Temp 98.1°F | Ht 62.0 in | Wt 200.2 lb

## 2014-08-02 DIAGNOSIS — R739 Hyperglycemia, unspecified: Secondary | ICD-10-CM

## 2014-08-02 DIAGNOSIS — K644 Residual hemorrhoidal skin tags: Secondary | ICD-10-CM | POA: Insufficient documentation

## 2014-08-02 DIAGNOSIS — Z23 Encounter for immunization: Secondary | ICD-10-CM | POA: Diagnosis not present

## 2014-08-02 DIAGNOSIS — I10 Essential (primary) hypertension: Secondary | ICD-10-CM

## 2014-08-02 DIAGNOSIS — E038 Other specified hypothyroidism: Secondary | ICD-10-CM

## 2014-08-02 DIAGNOSIS — B070 Plantar wart: Secondary | ICD-10-CM | POA: Insufficient documentation

## 2014-08-02 DIAGNOSIS — K648 Other hemorrhoids: Secondary | ICD-10-CM

## 2014-08-02 LAB — TSH: TSH: 2.04 u[IU]/mL (ref 0.35–4.50)

## 2014-08-02 LAB — BASIC METABOLIC PANEL
BUN: 11 mg/dL (ref 6–23)
CALCIUM: 9.2 mg/dL (ref 8.4–10.5)
CO2: 27 mEq/L (ref 19–32)
CREATININE: 0.9 mg/dL (ref 0.40–1.20)
Chloride: 101 mEq/L (ref 96–112)
GFR: 71.93 mL/min (ref >60.00)
Glucose, Bld: 87 mg/dL (ref 70–99)
Potassium: 3.4 mEq/L — ABNORMAL LOW (ref 3.5–5.1)
Sodium: 137 mEq/L (ref 135–145)

## 2014-08-02 MED ORDER — HYDROCORTISONE ACE-PRAMOXINE 1-1 % RE FOAM: 1.0000 | Freq: Two times a day (BID) | RECTAL | Status: DC

## 2014-08-02 NOTE — Progress Notes (Signed)
Pre visit review using our clinic review tool, if applicable. No additional management support is needed unless otherwise documented below in the visit note. 

## 2014-08-02 NOTE — Patient Instructions (Signed)

## 2014-08-03 NOTE — Assessment & Plan Note (Signed)
Her BP is well controlled Lytes and renal function are stable 

## 2014-08-03 NOTE — Assessment & Plan Note (Signed)
Podiatry referral

## 2014-08-03 NOTE — Assessment & Plan Note (Signed)
This is too small for I and D Will treat with a topical application

## 2014-08-03 NOTE — Assessment & Plan Note (Signed)
Her TSh is in the normal range Will cont the current synthroid dose 

## 2014-08-03 NOTE — Progress Notes (Signed)
Subjective:    Patient ID: Holly Mcmillan, female    DOB: 1970/08/06, 44 y.o.   MRN: [id number]  Thyroid Problem Presents for follow-up visit. Symptoms include weight loss. Patient reports no anxiety, cold intolerance, constipation, depressed mood, diaphoresis, diarrhea, dry skin, fatigue, hair loss, heat intolerance, hoarse voice, leg swelling, nail problem, palpitations, tremors, visual change or weight gain. The symptoms have been improving. Past treatments include levothyroxine. The treatment provided significant relief.      Review of Systems  Constitutional: Positive for weight loss. Negative for fever, chills, weight gain, diaphoresis, appetite change and fatigue.  HENT: Negative.  Negative for hoarse voice.   Eyes: Negative.   Respiratory: Negative.  Negative for cough, choking, chest tightness, shortness of breath and stridor.   Cardiovascular: Negative.  Negative for chest pain, palpitations and leg swelling.  Gastrointestinal: Positive for rectal pain. Negative for nausea, vomiting, abdominal pain, diarrhea, constipation, blood in stool, abdominal distention and anal bleeding.       She has a few anal hemorrhoids that bother her  Endocrine: Negative.  Negative for cold intolerance and heat intolerance.  Genitourinary: Negative.  Negative for urgency, hematuria, decreased urine volume and difficulty urinating.  Musculoskeletal: Negative.  Negative for myalgias, back pain, arthralgias and neck pain.  Skin: Negative.        She has a lesion on the bottom of her left foot that has been there for several years  Allergic/Immunologic: Negative.   Neurological: Negative.  Negative for tremors.  Hematological: Negative.  Negative for adenopathy. Does not bruise/bleed easily.  Psychiatric/Behavioral: Negative.  The patient is not nervous/anxious.        Objective:   Physical Exam  Constitutional: She is oriented to person, place, and time. She appears well-developed and  well-nourished. No distress.  HENT:  Head: Normocephalic and atraumatic.  Mouth/Throat: Oropharynx is clear and moist. No oropharyngeal exudate.  Eyes: Conjunctivae are normal. Right eye exhibits no discharge. Left eye exhibits no discharge. No scleral icterus.  Neck: Normal range of motion. Neck supple. No JVD present. No tracheal deviation present. No thyromegaly present.  Cardiovascular: Normal rate, regular rhythm, normal heart sounds and intact distal pulses.  Exam reveals no gallop and no friction rub.   No murmur heard. Pulmonary/Chest: Effort normal and breath sounds normal. No stridor. No respiratory distress. She has no wheezes. She has no rales. She exhibits no tenderness.  Abdominal: Soft. Bowel sounds are normal. She exhibits no distension and no mass. There is no tenderness. There is no rebound and no guarding.  Genitourinary: Rectal exam shows external hemorrhoid and internal hemorrhoid. Rectal exam shows no fissure, no mass, no tenderness and anal tone normal. Guaiac negative stool.  There are two small, uncomplicated external hemorrhoids  Musculoskeletal: Normal range of motion. She exhibits no edema or tenderness.  Lymphadenopathy:    She has no cervical adenopathy.  Neurological: She is oriented to person, place, and time.  Skin: Skin is warm and dry. No rash noted. She is not diaphoretic. No erythema. No pallor.  Plantar left foot shows a verrucous wart with surrounding callous formation  Vitals reviewed.    Lab Results  Component Value Date   WBC 12.1* 05/22/2014   HGB 14.4 05/22/2014   HCT 43.7 05/22/2014   PLT 349.0 05/22/2014   GLUCOSE 87 08/02/2014   CHOL 225* 01/15/2014   TRIG 274.0* 01/15/2014   HDL 44.90 01/15/2014   LDLDIRECT 164.9 01/15/2014   ALT 16 04/12/2014   AST 18 04/12/2014  NA 137 08/02/2014   K 3.4* 08/02/2014   CL 101 08/02/2014   CREATININE 0.90 08/02/2014   BUN 11 08/02/2014   CO2 27 08/02/2014   TSH 2.04 08/02/2014   HGBA1C 5.5  05/22/2014       Assessment & Plan:

## 2014-08-11 ENCOUNTER — Ambulatory Visit (INDEPENDENT_AMBULATORY_CARE_PROVIDER_SITE_OTHER): Payer: 59 | Admitting: Podiatry

## 2014-08-11 ENCOUNTER — Encounter: Payer: Self-pay | Admitting: Podiatry

## 2014-08-11 VITALS — BP 117/60 | HR 74 | Resp 12

## 2014-08-11 DIAGNOSIS — L84 Corns and callosities: Secondary | ICD-10-CM | POA: Diagnosis not present

## 2014-08-11 DIAGNOSIS — M79673 Pain in unspecified foot: Secondary | ICD-10-CM | POA: Diagnosis not present

## 2014-08-11 DIAGNOSIS — Q828 Other specified congenital malformations of skin: Secondary | ICD-10-CM

## 2014-08-11 NOTE — Progress Notes (Signed)
   Subjective:    Patient ID: Holly Mcmillan, female    DOB: [birth year], 44 y.o.   MRN: [id number]  HPI  44 year old female presents the office with complaints of calluses to both of her feet. She states that she has pain to the area particular pressure or after stated for long periods of time. She states that in progressive the last 2 months. She has tried over-the-counter corn remover pads however it does not help. She denies any redness or drainage from around the sites. Denies any history of injury or trauma or denies Any foreign objects. No other complaints at this time.  Review of Systems  All other systems reviewed and are negative.      Objective:   Physical Exam AAO x3, NAD DP/PT pulses palpable bilaterally, CRT less than 3 seconds Protective sensation intact with Simms Weinstein monofilament, vibratory sensation intact, Achilles tendon reflex intact Bilateral submetatarsal 2 hyperkeratotic lesions and small areas of small annular hyperkeratotic lesions 2 on the left foot. There is mild tenderness to palpation overlying these lesions directly. Upon debridement there is no underlying ulceration, pinpoint bleeding, drainage, or other clinical signs of infection. No other areas of tenderness to bilateral lower extremities. No overlying edema, erythema, increase in warmth to bilateral lower extremities. MMT 5/5, ROM WNL No open lesions or other pre-ulcerative lesions are identified. No pain with calf compression, swelling, warmth, erythema.      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 -Over the site of the 2 porokeratosis on the left foot A pad was placed around the area followed by salinocaine and a bandage. Post procedure care was discussed the patient for which she verbally understood. Monitor  for any signs or symptoms of infection. -Follow-up as needed. In the meantime encouraged to call the office with any questions, concerns, change in symptoms.

## 2014-09-18 ENCOUNTER — Other Ambulatory Visit: Payer: Self-pay | Admitting: Obstetrics & Gynecology

## 2014-09-19 LAB — CYTOLOGY - PAP

## 2014-11-14 LAB — HM DIABETES EYE EXAM

## 2014-12-07 ENCOUNTER — Telehealth: Payer: Self-pay | Admitting: *Deleted

## 2014-12-07 DIAGNOSIS — F32A Depression, unspecified: Secondary | ICD-10-CM

## 2014-12-07 DIAGNOSIS — F329 Major depressive disorder, single episode, unspecified: Secondary | ICD-10-CM

## 2014-12-07 MED ORDER — BUPROPION HCL ER (XL) 300 MG PO TB24
300.0000 mg | ORAL_TABLET | Freq: Every day | ORAL | Status: DC
Start: 2014-12-07 — End: 2015-04-04

## 2014-12-07 NOTE — Telephone Encounter (Signed)
Received call pt states she is needing new rx sent on her bupripon to her mail service. Verified [facility]...Johny Chess

## 2014-12-11 ENCOUNTER — Other Ambulatory Visit: Payer: Self-pay | Admitting: Internal Medicine

## 2014-12-12 LAB — HM MAMMOGRAPHY: HM Mammogram: NORMAL (ref 0–4)

## 2015-01-25 ENCOUNTER — Other Ambulatory Visit: Payer: Self-pay | Admitting: Internal Medicine

## 2015-03-25 ENCOUNTER — Other Ambulatory Visit: Payer: Self-pay | Admitting: Internal Medicine

## 2015-04-04 ENCOUNTER — Encounter: Payer: Self-pay | Admitting: Internal Medicine

## 2015-04-04 ENCOUNTER — Other Ambulatory Visit (INDEPENDENT_AMBULATORY_CARE_PROVIDER_SITE_OTHER): Payer: Commercial Managed Care - HMO

## 2015-04-04 ENCOUNTER — Ambulatory Visit (INDEPENDENT_AMBULATORY_CARE_PROVIDER_SITE_OTHER): Payer: Commercial Managed Care - HMO | Admitting: Internal Medicine

## 2015-04-04 VITALS — BP 144/84 | HR 74 | Temp 97.1°F | Resp 16 | Ht 62.0 in | Wt 210.0 lb

## 2015-04-04 DIAGNOSIS — E038 Other specified hypothyroidism: Secondary | ICD-10-CM

## 2015-04-04 DIAGNOSIS — I10 Essential (primary) hypertension: Secondary | ICD-10-CM

## 2015-04-04 DIAGNOSIS — E785 Hyperlipidemia, unspecified: Secondary | ICD-10-CM

## 2015-04-04 DIAGNOSIS — E781 Pure hyperglyceridemia: Secondary | ICD-10-CM

## 2015-04-04 DIAGNOSIS — M7712 Lateral epicondylitis, left elbow: Secondary | ICD-10-CM

## 2015-04-04 DIAGNOSIS — M7711 Lateral epicondylitis, right elbow: Secondary | ICD-10-CM | POA: Insufficient documentation

## 2015-04-04 DIAGNOSIS — F329 Major depressive disorder, single episode, unspecified: Secondary | ICD-10-CM | POA: Diagnosis not present

## 2015-04-04 DIAGNOSIS — F32A Depression, unspecified: Secondary | ICD-10-CM

## 2015-04-04 LAB — BASIC METABOLIC PANEL
BUN: 15 mg/dL (ref 6–23)
CALCIUM: 9 mg/dL (ref 8.4–10.5)
CO2: 29 mEq/L (ref 19–32)
Chloride: 103 mEq/L (ref 96–112)
Creatinine, Ser: 0.89 mg/dL (ref 0.40–1.20)
GFR: 72.64 mL/min (ref >60.00)
GLUCOSE: 87 mg/dL (ref 70–99)
POTASSIUM: 4.1 meq/L (ref 3.5–5.1)
SODIUM: 139 meq/L (ref 135–145)

## 2015-04-04 LAB — LIPID PANEL
Cholesterol: 258 mg/dL — ABNORMAL HIGH (ref 0–200)
HDL: 51.5 mg/dL (ref >39.00)
LDL CALC: 170 mg/dL — AB (ref 0–99)
NonHDL: 206.37
Total CHOL/HDL Ratio: 5
Triglycerides: 180 mg/dL — ABNORMAL HIGH (ref 0.0–149.0)
VLDL: 36 mg/dL (ref 0.0–40.0)

## 2015-04-04 LAB — TSH: TSH: 3.39 u[IU]/mL (ref 0.35–4.50)

## 2015-04-04 MED ORDER — BUPROPION HCL ER (XL) 300 MG PO TB24
300.0000 mg | ORAL_TABLET | Freq: Every day | ORAL | Status: DC
Start: 2015-04-04 — End: 2015-09-05

## 2015-04-04 MED ORDER — VALSARTAN-HYDROCHLOROTHIAZIDE 160-12.5 MG PO TABS: 1.0000 | ORAL_TABLET | Freq: Every day | ORAL | Status: DC

## 2015-04-04 MED ORDER — LEVOTHYROXINE SODIUM 175 MCG PO TABS: 175.0000 ug | ORAL_TABLET | Freq: Every day | ORAL | Status: DC

## 2015-04-04 MED ORDER — LORCASERIN HCL ER 20 MG PO TB24
1.0000 | ORAL_TABLET | Freq: Every day | ORAL | Status: DC
Start: 2015-04-04 — End: 2016-03-16

## 2015-04-04 NOTE — Progress Notes (Signed)
Subjective:  Patient ID: Holly Mcmillan, female    DOB: Apr 08, 1971  Age: 44 y.o. MRN: [id number]  CC: Hypertension; Hypothyroidism; and Hyperlipidemia   HPI Holly Mcmillan presents for follow-up, she complains of weight gain and fatigue. She also complains of bilateral elbow pain for several months. She takes Motrin for symptom relief. She does repetitive activities but cannot recall any specific trauma or injury. The pain is described as an achiness with palpation and with range of motion such as lifting.  Outpatient Prescriptions Prior to Visit  Medication Sig Dispense Refill  . fluticasone (FLONASE) 50 MCG/ACT nasal spray   0  . hydrocortisone-pramoxine (PROCTOFOAM HC) rectal foam Place 1 applicator rectally 2 (two) times daily. 10 g 2  . LO LOESTRIN FE 1 MG-10 MCG / 10 MCG tablet     . buPROPion (WELLBUTRIN XL) 300 MG 24 hr tablet Take 1 tablet (300 mg total) by mouth daily. 90 tablet 1  . levothyroxine (SYNTHROID, LEVOTHROID) 175 MCG tablet TAKE 1 TABLET DAILY BEFORE BREAKFAST 90 tablet 0  . valsartan-hydrochlorothiazide (DIOVAN-HCT) 160-12.5 MG per tablet TAKE 1 TABLET DAILY 90 tablet 0   No facility-administered medications prior to visit.    ROS Review of Systems  Constitutional: Positive for fatigue and unexpected weight change. Negative for fever, chills, diaphoresis, activity change and appetite change.  HENT: Negative.   Eyes: Negative.   Respiratory: Negative.  Negative for cough, choking, chest tightness, shortness of breath and stridor.   Cardiovascular: Negative.  Negative for chest pain, palpitations and leg swelling.  Gastrointestinal: Negative.  Negative for nausea, vomiting, abdominal pain, diarrhea, constipation and blood in stool.  Endocrine: Negative.   Genitourinary: Negative.   Musculoskeletal: Positive for arthralgias (both elbows). Negative for myalgias, back pain, joint swelling and neck pain.  Skin: Negative.  Negative for color change and rash.    Allergic/Immunologic: Negative.   Neurological: Negative.  Negative for dizziness, weakness, light-headedness and numbness.  Hematological: Negative.  Negative for adenopathy. Does not bruise/bleed easily.  Psychiatric/Behavioral: Negative.     Objective:  BP 144/84 mmHg  Pulse 74  Temp(Src) 97.1 F (36.2 C) (Oral)  Resp 16  Ht 5\' 2"  (1.575 m)  Wt 210 lb (95.255 kg)  BMI 38.40 kg/m2  SpO2 97%  BP Readings from Last 3 Encounters:  04/04/15 144/84  08/11/14 117/60  08/02/14 112/80    Wt Readings from Last 3 Encounters:  04/04/15 210 lb (95.255 kg)  08/02/14 200 lb 4 oz (90.833 kg)  05/22/14 222 lb 8 oz (100.925 kg)    Physical Exam  Constitutional: She is oriented to person, place, and time. No distress.  HENT:  Head: Normocephalic and atraumatic.  Mouth/Throat: Oropharynx is clear and moist. No oropharyngeal exudate.  Eyes: Conjunctivae are normal. Right eye exhibits no discharge. Left eye exhibits no discharge. No scleral icterus.  Neck: Normal range of motion. Neck supple. No JVD present. No tracheal deviation present. No thyromegaly present.  Cardiovascular: Normal rate, regular rhythm, normal heart sounds and intact distal pulses.  Exam reveals no gallop and no friction rub.   No murmur heard. Pulmonary/Chest: Effort normal and breath sounds normal. No stridor. No respiratory distress. She has no wheezes. She has no rales. She exhibits no tenderness.  Abdominal: Soft. She exhibits no distension and no mass. There is no tenderness. There is no rebound and no guarding.  Musculoskeletal: Normal range of motion. She exhibits no edema or tenderness.       Right elbow: She exhibits  normal range of motion, no swelling, no effusion, no deformity and no laceration.       Left elbow: She exhibits normal range of motion, no swelling, no effusion, no deformity and no laceration.  Lymphadenopathy:    She has no cervical adenopathy.  Neurological: She is oriented to person, place,  and time.  Skin: Skin is warm and dry. No rash noted. She is not diaphoretic. No erythema. No pallor.  Psychiatric: She has a normal mood and affect. Her behavior is normal. Judgment and thought content normal.  Vitals reviewed.   Lab Results  Component Value Date   WBC 12.1* 05/22/2014   HGB 14.4 05/22/2014   HCT 43.7 05/22/2014   PLT 349.0 05/22/2014   GLUCOSE 87 04/04/2015   CHOL 258* 04/04/2015   TRIG 180.0* 04/04/2015   HDL 51.50 04/04/2015   LDLDIRECT 164.9 01/15/2014   LDLCALC 170* 04/04/2015   ALT 16 04/12/2014   AST 18 04/12/2014   NA 139 04/04/2015   K 4.1 04/04/2015   CL 103 04/04/2015   CREATININE 0.89 04/04/2015   BUN 15 04/04/2015   CO2 29 04/04/2015   TSH 3.39 04/04/2015   HGBA1C 5.5 05/22/2014    Ct Abdomen Pelvis W Contrast  04/13/2014  CLINICAL DATA:  Upper abdominal pain for 7 hours.  Nausea. EXAM: CT ABDOMEN AND PELVIS WITH CONTRAST TECHNIQUE: Multidetector CT imaging of the abdomen and pelvis was performed using the standard protocol following bolus administration of intravenous contrast. CONTRAST:  52mL OMNIPAQUE IOHEXOL 300 MG/ML SOLN, 186mL OMNIPAQUE IOHEXOL 300 MG/ML SOLN COMPARISON:  07/02/2013 FINDINGS: Lung bases are clear. Surgical absence of the gallbladder. The liver, spleen, pancreas, adrenal glands, kidneys, abdominal aorta, inferior vena cava, and retroperitoneal lymph nodes are unremarkable. Stomach and small bowel are decompressed. Stool-filled colon without abnormal distention. No free air or free fluid in the abdomen. Pelvis: Appendix is normal. Uterus and ovaries are not enlarged. Bladder wall is not thickened. No free or loculated pelvic fluid collections. No pelvic mass or lymphadenopathy. Anterior inferior abdominal wall hernia at the right rectus abdominus muscle insertion inferiorly containing fat. No bowel herniation. Mild lumbar scoliosis. No destructive bone lesions. IMPRESSION: No acute process demonstrated in the abdomen or pelvis that  would account for the patient's symptoms. Surgical absence of the gallbladder. Small fat containing inferior anterior abdominal wall hernia. No change since previous study. Electronically Signed   By: Lucienne Capers M.D.   On: 04/13/2014 00:39    Assessment & Plan:   Holly Mcmillan was seen today for hypertension, hypothyroidism and hyperlipidemia.  Diagnoses and all orders for this visit:  Depression -     buPROPion (WELLBUTRIN XL) 300 MG 24 hr tablet; Take 1 tablet (300 mg total) by mouth daily.  Essential hypertension- her blood pressures well controlled, electrolytes and renal function are stable. -     Basic metabolic panel; Future  Other specified hypothyroidism- her TSH is in the normal range, she will remain on the current dose of Synthroid. -     TSH; Future -     levothyroxine (SYNTHROID, LEVOTHROID) 175 MCG tablet; Take 1 tablet (175 mcg total) by mouth daily before breakfast.  Hypertriglyceridemia, essential - improvement noted -     Lipid panel; Future  Morbid obesity, unspecified obesity type ([place])- in addition to her lifestyle modifications with diet and exercise she is willing to start a medication to help with weight loss. I recommended that she start taking Belviq. -     Lorcaserin HCl ER (  BELVIQ XR) 20 MG TB24; Take 1 tablet by mouth daily.  Lateral epicondylitis of both elbows- she will continue ibuprofen as needed, I've also asked her to start occupational therapy for this. -     Ambulatory referral to Occupational Therapy  Hyperlipidemia with target LDL less than 100- her Framingham risk score is only 4% so I do not recommend that she start a statin.  Other orders -     valsartan-hydrochlorothiazide (DIOVAN-HCT) 160-12.5 MG tablet; Take 1 tablet by mouth daily. -     Cancel: levothyroxine (SYNTHROID, LEVOTHROID) 175 MCG tablet; Take 1 tablet (175 mcg total) by mouth daily before breakfast.   I have changed Holly Mcmillan's valsartan-hydrochlorothiazide and levothyroxine.  I am also having her start on Lorcaserin HCl ER. Additionally, I am having her maintain her fluticasone, LO LOESTRIN FE, hydrocortisone-pramoxine, and buPROPion.  Meds ordered this encounter  Medications  . valsartan-hydrochlorothiazide (DIOVAN-HCT) 160-12.5 MG tablet    Sig: Take 1 tablet by mouth daily.    Dispense:  90 tablet    Refill:  0  . buPROPion (WELLBUTRIN XL) 300 MG 24 hr tablet    Sig: Take 1 tablet (300 mg total) by mouth daily.    Dispense:  90 tablet    Refill:  3  . Lorcaserin HCl ER (BELVIQ XR) 20 MG TB24    Sig: Take 1 tablet by mouth daily.    Dispense:  30 tablet    Refill:  5  . levothyroxine (SYNTHROID, LEVOTHROID) 175 MCG tablet    Sig: Take 1 tablet (175 mcg total) by mouth daily before breakfast.    Dispense:  90 tablet    Refill:  1     Follow-up: Return in about 3 months (around 07/03/2015).  Scarlette Calico, MD

## 2015-04-04 NOTE — Patient Instructions (Signed)
Hypertension Hypertension, commonly called high blood pressure, is when the force of blood pumping through your arteries is too strong. Your arteries are the blood vessels that carry blood from your heart throughout your body. A blood pressure reading consists of a higher number over a lower number, such as 110/72. The higher number (systolic) is the pressure inside your arteries when your heart pumps. The lower number (diastolic) is the pressure inside your arteries when your heart relaxes. Ideally you want your blood pressure below 120/80. Hypertension forces your heart to work harder to pump blood. Your arteries may become narrow or stiff. Having untreated or uncontrolled hypertension can cause heart attack, stroke, kidney disease, and other problems. RISK FACTORS Some risk factors for high blood pressure are controllable. Others are not.  Risk factors you cannot control include:   Race. You may be at higher risk if you are African American.  Age. Risk increases with age.  Gender. Men are at higher risk than women before age 45 years. After age 65, women are at higher risk than men. Risk factors you can control include:  Not getting enough exercise or physical activity.  Being overweight.  Getting too much fat, sugar, calories, or salt in your diet.  Drinking too much alcohol. SIGNS AND SYMPTOMS Hypertension does not usually cause signs or symptoms. Extremely high blood pressure (hypertensive crisis) may cause headache, anxiety, shortness of breath, and nosebleed. DIAGNOSIS To check if you have hypertension, your health care provider will measure your blood pressure while you are seated, with your arm held at the level of your heart. It should be measured at least twice using the same arm. Certain conditions can cause a difference in blood pressure between your right and left arms. A blood pressure reading that is higher than normal on one occasion does not mean that you need treatment. If  it is not clear whether you have high blood pressure, you may be asked to return on a different day to have your blood pressure checked again. Or, you may be asked to monitor your blood pressure at home for 1 or more weeks. TREATMENT Treating high blood pressure includes making lifestyle changes and possibly taking medicine. Living a healthy lifestyle can help lower high blood pressure. You may need to change some of your habits. Lifestyle changes may include:  Following the DASH diet. This diet is high in fruits, vegetables, and whole grains. It is low in salt, red meat, and added sugars.  Keep your sodium intake below 2,300 mg per day.  Getting at least 30-45 minutes of aerobic exercise at least 4 times per week.  Losing weight if necessary.  Not smoking.  Limiting alcoholic beverages.  Learning ways to reduce stress. Your health care provider may prescribe medicine if lifestyle changes are not enough to get your blood pressure under control, and if one of the following is true:  You are 18-59 years of age and your systolic blood pressure is above 140.  You are 60 years of age or older, and your systolic blood pressure is above 150.  Your diastolic blood pressure is above 90.  You have diabetes, and your systolic blood pressure is over 140 or your diastolic blood pressure is over 90.  You have kidney disease and your blood pressure is above 140/90.  You have heart disease and your blood pressure is above 140/90. Your personal target blood pressure may vary depending on your medical conditions, your age, and other factors. HOME CARE INSTRUCTIONS    Have your blood pressure rechecked as directed by your health care provider.   Take medicines only as directed by your health care provider. Follow the directions carefully. Blood pressure medicines must be taken as prescribed. The medicine does not work as well when you skip doses. Skipping doses also puts you at risk for  problems.  Do not smoke.   Monitor your blood pressure at home as directed by your health care provider. SEEK MEDICAL CARE IF:   You think you are having a reaction to medicines taken.  You have recurrent headaches or feel dizzy.  You have swelling in your ankles.  You have trouble with your vision. SEEK IMMEDIATE MEDICAL CARE IF:  You develop a severe headache or confusion.  You have unusual weakness, numbness, or feel faint.  You have severe chest or abdominal pain.  You vomit repeatedly.  You have trouble breathing. MAKE SURE YOU:   Understand these instructions.  Will watch your condition.  Will get help right away if you are not doing well or get worse.   This information is not intended to replace advice given to you by your health care provider. Make sure you discuss any questions you have with your health care provider.   Document Released: 04/20/2005 Document Revised: 09/04/2014 Document Reviewed: 02/10/2013 Elsevier Interactive Patient Education 2016 [employer]  

## 2015-04-04 NOTE — Progress Notes (Signed)
Pre visit review using our clinic review tool, if applicable. No additional management support is needed unless otherwise documented below in the visit note. 

## 2015-08-27 ENCOUNTER — Other Ambulatory Visit (INDEPENDENT_AMBULATORY_CARE_PROVIDER_SITE_OTHER): Payer: Managed Care, Other (non HMO)

## 2015-08-27 ENCOUNTER — Telehealth: Payer: Self-pay

## 2015-08-27 ENCOUNTER — Ambulatory Visit (INDEPENDENT_AMBULATORY_CARE_PROVIDER_SITE_OTHER): Payer: Managed Care, Other (non HMO) | Admitting: Family

## 2015-08-27 ENCOUNTER — Ambulatory Visit
Admission: RE | Admit: 2015-08-27 | Discharge: 2015-08-27 | Disposition: A | Discharge status: Home / Self Care | Payer: Managed Care, Other (non HMO) | Source: Ambulatory Visit | Attending: Family | Admitting: Family

## 2015-08-27 ENCOUNTER — Encounter: Payer: Self-pay | Admitting: Family

## 2015-08-27 VITALS — BP 110/60 | HR 76 | Temp 98.5°F | Resp 18 | Ht 62.0 in | Wt 192.0 lb

## 2015-08-27 DIAGNOSIS — R109 Unspecified abdominal pain: Secondary | ICD-10-CM

## 2015-08-27 DIAGNOSIS — F32A Depression, unspecified: Secondary | ICD-10-CM

## 2015-08-27 DIAGNOSIS — F329 Major depressive disorder, single episode, unspecified: Secondary | ICD-10-CM | POA: Diagnosis not present

## 2015-08-27 LAB — COMPREHENSIVE METABOLIC PANEL
ALT: 26 U/L (ref 0–35)
AST: 15 U/L (ref 0–37)
Albumin: 4.4 g/dL (ref 3.5–5.2)
Alkaline Phosphatase: 39 U/L (ref 39–117)
BUN: 10 mg/dL (ref 6–23)
CHLORIDE: 103 meq/L (ref 96–112)
CO2: 27 meq/L (ref 19–32)
CREATININE: 0.96 mg/dL (ref 0.40–1.20)
Calcium: 9.4 mg/dL (ref 8.4–10.5)
GFR: 66.45 mL/min (ref >60.00)
GLUCOSE: 92 mg/dL (ref 70–99)
POTASSIUM: 4 meq/L (ref 3.5–5.1)
SODIUM: 139 meq/L (ref 135–145)
Total Bilirubin: 0.3 mg/dL (ref 0.2–1.2)
Total Protein: 7.5 g/dL (ref 6.0–8.3)

## 2015-08-27 LAB — CBC
HCT: 43.1 % (ref 36.0–46.0)
Hemoglobin: 14.7 g/dL (ref 12.0–15.0)
MCHC: 34.1 g/dL (ref 30.0–36.0)
MCV: 93 fl (ref 78.0–100.0)
Platelets: 315 10*3/uL (ref 150.0–400.0)
RBC: 4.64 Mil/uL (ref 3.87–5.11)
RDW: 13.4 % (ref 11.5–15.5)
WBC: 10.2 10*3/uL (ref 4.0–10.5)

## 2015-08-27 LAB — AMYLASE: AMYLASE: 29 U/L (ref 27–131)

## 2015-08-27 LAB — LIPASE: LIPASE: 33 U/L (ref 11.0–59.0)

## 2015-08-27 MED ORDER — ACETAMINOPHEN-CODEINE #3 300-30 MG PO TABS: 1.0000 | ORAL_TABLET | ORAL | Status: DC | PRN

## 2015-08-27 NOTE — Patient Instructions (Addendum)
Thank you for choosing [facility].  Summary/Instructions:  They will call and schedule your ultrasound.  Please use Tylenol 3 as needed for discomfort.  Your prescription(s) have been submitted to your pharmacy or been printed and provided for you. Please take as directed and contact our office if you believe you are having problem(s) with the medication(s) or have any questions.  Please stop by the lab on the basement level of the building for your blood work. Your results will be released to [facility] (or called to you) after review, usually within 72 hours after test completion. If any changes need to be made, you will be notified at that same time.  If your symptoms worsen or fail to improve, please contact our office for further instruction, or in case of emergency go directly to the emergency room at the closest medical facility.

## 2015-08-27 NOTE — Progress Notes (Signed)
Pre visit review using our clinic review tool, if applicable. No additional management support is needed unless otherwise documented below in the visit note. 

## 2015-08-27 NOTE — Progress Notes (Signed)
Subjective:    Patient ID: [name], female    DOB: October 16, 1970, 45 y.o.   MRN: [id number]  Chief Complaint  Patient presents with  . Abdominal Pain    having abdominal pain "shooting" in her stomach, right around belly button towards the left, x1 day, pain is 7 on 1-10 scale    HPI:  TEXANNA HILBURN is a 45 y.o. female who  has a past medical history of Depression; Headache(784.0); Hypertension; Ovarian cyst; Dysmenorrhea; Obesity; Diabetes mellitus (05/2011); Hypothyroidism; and Gallbladder disease. and presents today For an acute office visit.  This is a new problem. Associated symptom of pain located in her lower abdomen has been going on for approximately 1-2 days and described as shooting with a severity of 7/10. No abdominal trauma that she can recall. No fevers or chills. Denies constipation, vomiting, or diarrhea. Does endorse some mild nausea. Course of the symptoms has remained about the same since the initial onset.   Allergies  Allergen Reactions  . Spironolactone     Hair loss  . Sulfonamide Derivatives     REACTION: Hives     Current Outpatient Prescriptions on File Prior to Visit  Medication Sig Dispense Refill  . buPROPion (WELLBUTRIN XL) 300 MG 24 hr tablet Take 1 tablet (300 mg total) by mouth daily. 90 tablet 3  . fluticasone (FLONASE) 50 MCG/ACT nasal spray   0  . levothyroxine (SYNTHROID, LEVOTHROID) 175 MCG tablet Take 1 tablet (175 mcg total) by mouth daily before breakfast. 90 tablet 1  . LO LOESTRIN FE 1 MG-10 MCG / 10 MCG tablet     . Lorcaserin HCl ER (BELVIQ XR) 20 MG TB24 Take 1 tablet by mouth daily. 30 tablet 5  . valsartan-hydrochlorothiazide (DIOVAN-HCT) 160-12.5 MG tablet Take 1 tablet by mouth daily. 90 tablet 0   No current facility-administered medications on file prior to visit.     Past Surgical History  Procedure Laterality Date  . Nasal sinus surgery    . Cesarean section  2002, 2009  . Thyroid surgery  2012  . Cholecystectomy   2005  . Ablation      Past Medical History  Diagnosis Date  . Depression   . Headache(784.0)   . Hypertension   . Ovarian cyst   . Dysmenorrhea   . Obesity   . Diabetes mellitus 05/2011    Dr. Jacelyn Pi  . Hypothyroidism   . Gallbladder disease     Review of Systems  Constitutional: Negative for fever and chills.  Gastrointestinal: Positive for nausea and abdominal pain. Negative for vomiting, diarrhea and constipation.      Objective:    BP 110/60 mmHg  Pulse 76  Temp(Src) 98.5 F (36.9 C) (Oral)  Resp 18  Ht _0  (1.575 m)  Wt 192 lb (87.091 kg)  BMI 35.11 kg/m2  SpO2 96% Nursing note and vital signs reviewed.  Physical Exam  Constitutional: She is oriented to person, place, and time. She appears well-developed and well-nourished. No distress.  Cardiovascular: Normal rate, regular rhythm, normal heart sounds and intact distal pulses.   Pulmonary/Chest: Effort normal and breath sounds normal.  Abdominal: Soft. Normal appearance and bowel sounds are normal. She exhibits no mass. There is tenderness in the suprapubic area and left lower quadrant. There is rebound. There is no rigidity, no guarding, no tenderness at McBurney's point and negative Murphy's sign.  Neurological: She is alert and oriented to person, place, and time.  Skin: Skin is warm  and dry.  Psychiatric: She has a normal mood and affect. Her behavior is normal. Judgment and thought content normal.       Assessment & Plan:   Problem List Items Addressed This Visit      Other   Left sided abdominal pain - Primary    Symptoms and exam consistent with left-sided abdominal pain with concern for possible diverticulitis, ovarian cyst, with a possible appendicitis with referred pain given rebound tenderness. Obtain stat ultrasound. Obtain lipase, complete metabolic panel, CBC, amylase. Follow-up pending blood work and ultrasound results. Start Tylenol 3 as needed for discomfort.      Relevant  Medications   acetaminophen-codeine (TYLENOL #3) 300-30 MG tablet   Other Relevant Orders   CBC (Completed)   Comp Met (CMET) (Completed)   Lipase (Completed)   Amylase (Completed)   US Abdomen Complete    Other Visit Diagnoses    Depression            I have discontinued Ms. Dadisman's hydrocortisone-pramoxine. I am also having her start on acetaminophen-codeine. Additionally, I am having her maintain her fluticasone, LO LOESTRIN FE, valsartan-hydrochlorothiazide, buPROPion, Lorcaserin HCl ER, and levothyroxine.   Meds ordered this encounter  Medications  . acetaminophen-codeine (TYLENOL #3) 300-30 MG tablet    Sig: Take 1 tablet by mouth every 4 (four) hours as needed for moderate pain.    Dispense:  30 tablet    Refill:  0    Order Specific Question:  Supervising Provider    Answer:  Pricilla Holm A [[id number]]     Follow-up: Return if symptoms worsen or fail to improve.  Mauricio Po, FNP

## 2015-08-27 NOTE — Addendum Note (Signed)
Addended by: Mauricio Po D on: 08/27/2015 03:11 PM   Modules accepted: Orders

## 2015-08-27 NOTE — Assessment & Plan Note (Signed)
Symptoms and exam consistent with left-sided abdominal pain with concern for possible diverticulitis, ovarian cyst, with a possible appendicitis with referred pain given rebound tenderness. Obtain stat ultrasound. Obtain lipase, complete metabolic panel, CBC, amylase. Follow-up pending blood work and ultrasound results. Start Tylenol 3 as needed for discomfort.

## 2015-08-28 ENCOUNTER — Ambulatory Visit (INDEPENDENT_AMBULATORY_CARE_PROVIDER_SITE_OTHER)
Admission: RE | Admit: 2015-08-28 | Discharge: 2015-08-28 | Disposition: A | Discharge status: Home / Self Care | Payer: Managed Care, Other (non HMO) | Source: Ambulatory Visit | Attending: Family | Admitting: Family

## 2015-08-28 ENCOUNTER — Telehealth: Payer: Self-pay | Admitting: Family

## 2015-08-28 DIAGNOSIS — R109 Unspecified abdominal pain: Secondary | ICD-10-CM | POA: Diagnosis not present

## 2015-08-28 MED ORDER — IOPAMIDOL (ISOVUE-300) INJECTION 61%
100.0000 mL | Freq: Once | INTRAVENOUS | Status: AC | PRN
  Administered 2015-08-28: 100 mL via INTRAVENOUS

## 2015-08-28 NOTE — Telephone Encounter (Signed)
Spoke with patient regarding her results that her CT scan was normal with increased stool burden noted. Patient expresses that she continues to experience pain and has had a couple of bowel movements secondary to the contrast. Will refer to gastroenterology for further evaluation and assessment.

## 2015-08-28 NOTE — Telephone Encounter (Signed)
Pt's appt scheduled for today 4/26 2:30

## 2015-08-28 NOTE — Telephone Encounter (Signed)
Patient called to follow up in regards to ct abdomen ordered yesterday. She was supposed to get an u/s, but when she went she found out that she was supposed to have a CT. No status at this point,. Please follow up with patient.

## 2015-09-05 ENCOUNTER — Other Ambulatory Visit: Payer: Self-pay

## 2015-09-05 DIAGNOSIS — F329 Major depressive disorder, single episode, unspecified: Secondary | ICD-10-CM

## 2015-09-05 DIAGNOSIS — F32A Depression, unspecified: Secondary | ICD-10-CM

## 2015-09-05 DIAGNOSIS — E038 Other specified hypothyroidism: Secondary | ICD-10-CM

## 2015-09-05 MED ORDER — LEVOTHYROXINE SODIUM 175 MCG PO TABS: 175.0000 ug | ORAL_TABLET | Freq: Every day | ORAL | Status: DC

## 2015-09-05 MED ORDER — BUPROPION HCL ER (XL) 300 MG PO TB24: 300.0000 mg | ORAL_TABLET | Freq: Every day | ORAL | Status: DC

## 2015-09-05 MED ORDER — VALSARTAN-HYDROCHLOROTHIAZIDE 160-12.5 MG PO TABS: 1.0000 | ORAL_TABLET | Freq: Every day | ORAL | Status: DC

## 2015-09-05 NOTE — Telephone Encounter (Signed)
Patient is needing this refilled. Said NP was going to refill but pharmacy didn't get it. buPROPion (WELLBUTRIN XL) 300 MG 24 hr tablet  Also she says she needs these meds refilled too valsartan-hydrochlorothiazide (DIOVAN-HCT) 160-12.5 MG levothyroxine (SYNTHROID, LEVOTHROID) 175 MCG   Her pharmacy is [facility] on [address].

## 2015-09-05 NOTE — Telephone Encounter (Signed)
Ok to fill. Other meds sent

## 2015-09-17 ENCOUNTER — Encounter: Payer: Self-pay | Admitting: Internal Medicine

## 2015-10-01 ENCOUNTER — Ambulatory Visit: Payer: Managed Care, Other (non HMO) | Admitting: Internal Medicine

## 2015-11-28 ENCOUNTER — Ambulatory Visit (INDEPENDENT_AMBULATORY_CARE_PROVIDER_SITE_OTHER): Payer: Managed Care, Other (non HMO) | Admitting: Family Medicine

## 2015-11-28 ENCOUNTER — Encounter: Payer: Self-pay | Admitting: Family Medicine

## 2015-11-28 VITALS — BP 112/76 | HR 83 | Temp 98.7°F | Ht 62.0 in | Wt 199.9 lb

## 2015-11-28 DIAGNOSIS — H00015 Hordeolum externum left lower eyelid: Secondary | ICD-10-CM | POA: Diagnosis not present

## 2015-11-28 DIAGNOSIS — H00014 Hordeolum externum left upper eyelid: Secondary | ICD-10-CM

## 2015-11-28 MED ORDER — BACITRACIN 500 UNIT/GM OP OINT: 1.0000 "application " | TOPICAL_OINTMENT | Freq: Three times a day (TID) | OPHTHALMIC | 0 refills | Status: DC

## 2015-11-28 NOTE — Progress Notes (Signed)
Pre visit review using our clinic review tool, if applicable. No additional management support is needed unless otherwise documented below in the visit note. 

## 2015-11-28 NOTE — Progress Notes (Addendum)
Subjective:    Patient ID: Holly Mcmillan, female    DOB: May 11, 1970, 45 y.o.   MRN: [id number]  HPI  Holly Mcmillan is a 45 year old female who presents today with a "bump on my left eye" that started 4 days ago.  She also reports having a bump on her upper lid one week ago which has now improved.  Associated symptom of pain noted on eyelid is present. She denies fever, chills, sweats, drainage or discharge from the eye, and  visual disturbances.  Warm compresses have provided benefit. She denies history of eye redness or diabetes.  Review of Systems  Constitutional: Negative for chills, fatigue and fever.  HENT: Negative for postnasal drip, rhinorrhea and sinus pressure.   Eyes: Negative for photophobia, discharge, redness and visual disturbance.       "Bump on upper and lower left eyelid"  Respiratory: Negative for cough and shortness of breath.   Cardiovascular: Negative for chest pain and palpitations.  Musculoskeletal: Negative for myalgias.   Past Medical History:  Diagnosis Date  . Depression   . Diabetes mellitus 05/2011   Dr. Jacelyn Pi  . Dysmenorrhea   . Gallbladder disease   . Headache(784.0)   . Hypertension   . Hypothyroidism   . Obesity   . Ovarian cyst      Social History   Social History  . Marital status: Married    Spouse name: N/A  . Number of children: 2  . Years of education: N/A   Occupational History  . [employer]   Social History Main Topics  . Smoking status: Former Smoker    Packs/day: 0.30    Years: 20.00    Types: Cigarettes  . Smokeless tobacco: Never Used  . Alcohol use No  . Drug use: No  . Sexual activity: Not Currently   Other Topics Concern  . Not on file   Social History Narrative  . No narrative on file    Past Surgical History:  Procedure Laterality Date  . ablation    . CESAREAN SECTION  2002, 2009  . CHOLECYSTECTOMY  2005  . NASAL SINUS SURGERY    . THYROID SURGERY  2012    Family History    Problem Relation Age of Onset  . Alcohol abuse Paternal Uncle     x2  . Arthritis Father   . Hypertension Father   . Cancer Other   . Heart disease Mother     CHF, A Fib  . Colon cancer Maternal Aunt   . Alcohol abuse Maternal Uncle     x2  . Hypertension Mother   . Heart disease Maternal Grandfather   . Cervical cancer Mother   . Pancreatic cancer Maternal Aunt   . Lung cancer Maternal Uncle   . Leukemia Mother     Allergies  Allergen Reactions  . Spironolactone     Hair loss  . Sulfonamide Derivatives     REACTION: Hives    Current Outpatient Prescriptions on File Prior to Visit  Medication Sig Dispense Refill  . buPROPion (WELLBUTRIN XL) 300 MG 24 hr tablet Take 1 tablet (300 mg total) by mouth daily. 90 tablet 3  . levothyroxine (SYNTHROID, LEVOTHROID) 175 MCG tablet Take 1 tablet (175 mcg total) by mouth daily before breakfast. 90 tablet 1  . Lorcaserin HCl ER (BELVIQ XR) 20 MG TB24 Take 1 tablet by mouth daily. 30 tablet 5  . valsartan-hydrochlorothiazide (DIOVAN-HCT) 160-12.5 MG tablet Take 1 tablet  by mouth daily. 90 tablet 1  . acetaminophen-codeine (TYLENOL #3) 300-30 MG tablet Take 1 tablet by mouth every 4 (four) hours as needed for moderate pain. (Patient not taking: Reported on 11/28/2015) 30 tablet 0  . fluticasone (FLONASE) 50 MCG/ACT nasal spray   0  . LO LOESTRIN FE 1 MG-10 MCG / 10 MCG tablet      No current facility-administered medications on file prior to visit.     BP 112/76 (BP Location: Left Arm, Patient Position: Sitting, Cuff Size: Large)   Pulse 83   Temp 98.7 F (37.1 C) (Oral)   Ht 5\' 2"  (1.575 m)   Wt 199 lb 14.4 oz (90.7 kg)   SpO2 97%   BMI 36.56 kg/m        Objective:   Physical Exam  Constitutional: She is oriented to person, place, and time. She appears well-developed and well-nourished.  Eyes: Conjunctivae and EOM are normal. Pupils are equal, round, and reactive to light. Right eye exhibits no discharge. Left eye exhibits  hordeolum. Left eye exhibits no discharge.  Neck: Neck supple.  Cardiovascular: Normal rate and regular rhythm.   Pulmonary/Chest: Effort normal and breath sounds normal. She has no wheezes. She has no rales.  Lymphadenopathy:    She has no cervical adenopathy.  Neurological: She is alert and oriented to person, place, and time.  Skin: Skin is warm and dry. No rash noted.  Psychiatric: She has a normal mood and affect. Her behavior is normal. Judgment and thought content normal.       Assessment & Plan:  1. Hordeolum externum of left lower eyelid Exam and history support treatment for hordeolum.  Advised her to use warm compresses and provided written material for her review.  Further advised her to change her mascara which she reports to be 82 months old and avoid use of eye makeup while hordeolum is present.  - bacitracin ophthalmic ointment; Place 1 application into the left eye 3 (three) times daily. apply to eye  Dispense: 3.5 g; Refill: 0  2. Hordeolum externum of left upper eyelid  - bacitracin ophthalmic ointment; Place 1 application into the left eye 3 (three) times daily. apply to eye  Dispense: 3.5 g; Refill: 0  Follow up advised if symptoms do not improve with treatment in 3 to 4 days, worsen, or if there is interference with her vision, redness or pain to her eye, or she develops a fever >101.  Patient voiced understanding and agreed with plan.   Delano Metz, FNP-C

## 2015-11-28 NOTE — Patient Instructions (Addendum)
Please obtain new mascara or other eye care products. Follow up if you do not improve with treatment in 3 to 4 days, worsen, or you develop a fever >101.   Stye A stye is a bump on your eyelid caused by a bacterial infection. A stye can form inside the eyelid (internal stye) or outside the eyelid (external stye). An internal stye may be caused by an infected oil-producing gland inside your eyelid. An external stye may be caused by an infection at the base of your eyelash (hair follicle). Styes are very common. Anyone can get them at any age. They usually occur in just one eye, but you may have more than one in either eye.  CAUSES  The infection is almost always caused by bacteria called Staphylococcus aureus. This is a common type of bacteria that lives on your skin. RISK FACTORS You may be at higher risk for a stye if you have had one before. You may also be at higher risk if you have:  Diabetes.  Long-term illness.  Long-term eye redness.  A skin condition called seborrhea.  High fat levels in your blood (lipids). SIGNS AND SYMPTOMS  Eyelid pain is the most common symptom of a stye. Internal styes are more painful than external styes. Other signs and symptoms may include:  Painful swelling of your eyelid.  A scratchy feeling in your eye.  Tearing and redness of your eye.  Pus draining from the stye. DIAGNOSIS  Your health care provider may be able to diagnose a stye just by examining your eye. The health care provider may also check to make sure:  You do not have a fever or other signs of a more serious infection.  The infection has not spread to other parts of your eye or areas around your eye. TREATMENT  Most styes will clear up in a few days without treatment. In some cases, you may need to use antibiotic drops or ointment to prevent infection. Your health care provider may have to drain the stye surgically if your stye is:  Large.  Causing a lot of pain.  Interfering  with your vision. This can be done using a thin blade or a needle.  HOME CARE INSTRUCTIONS   Take medicines only as directed by your health care provider.  Apply a clean, warm compress to your eye for 10 minutes, 4 times a day.  Do not wear contact lenses or eye makeup until your stye has healed.  Do not try to pop or drain the stye. SEEK MEDICAL CARE IF:  You have chills or a fever.  Your stye does not go away after several days.  Your stye affects your vision.  Your eyeball becomes swollen, red, or painful. MAKE SURE YOU:  Understand these instructions.  Will watch your condition.  Will get help right away if you are not doing well or get worse.   This information is not intended to replace advice given to you by your health care provider. Make sure you discuss any questions you have with your health care provider.   Document Released: 01/28/2005 Document Revised: 05/11/2014 Document Reviewed: 08/04/2013 Elsevier Interactive Patient Education [employer].

## 2016-02-23 ENCOUNTER — Other Ambulatory Visit: Payer: Self-pay | Admitting: Internal Medicine

## 2016-02-23 DIAGNOSIS — E038 Other specified hypothyroidism: Secondary | ICD-10-CM

## 2016-03-16 ENCOUNTER — Other Ambulatory Visit (INDEPENDENT_AMBULATORY_CARE_PROVIDER_SITE_OTHER): Payer: Managed Care, Other (non HMO)

## 2016-03-16 ENCOUNTER — Encounter: Payer: Self-pay | Admitting: Internal Medicine

## 2016-03-16 ENCOUNTER — Ambulatory Visit (INDEPENDENT_AMBULATORY_CARE_PROVIDER_SITE_OTHER): Payer: Managed Care, Other (non HMO) | Admitting: Internal Medicine

## 2016-03-16 VITALS — BP 140/88 | HR 75 | Temp 97.8°F | Resp 16 | Ht 62.0 in | Wt 208.0 lb

## 2016-03-16 DIAGNOSIS — G2581 Restless legs syndrome: Secondary | ICD-10-CM | POA: Diagnosis not present

## 2016-03-16 DIAGNOSIS — F325 Major depressive disorder, single episode, in full remission: Secondary | ICD-10-CM

## 2016-03-16 DIAGNOSIS — R739 Hyperglycemia, unspecified: Secondary | ICD-10-CM | POA: Diagnosis not present

## 2016-03-16 DIAGNOSIS — Z0000 Encounter for general adult medical examination without abnormal findings: Secondary | ICD-10-CM | POA: Insufficient documentation

## 2016-03-16 DIAGNOSIS — E785 Hyperlipidemia, unspecified: Secondary | ICD-10-CM

## 2016-03-16 DIAGNOSIS — F3342 Major depressive disorder, recurrent, in full remission: Secondary | ICD-10-CM

## 2016-03-16 DIAGNOSIS — I10 Essential (primary) hypertension: Secondary | ICD-10-CM

## 2016-03-16 DIAGNOSIS — E890 Postprocedural hypothyroidism: Secondary | ICD-10-CM | POA: Diagnosis not present

## 2016-03-16 LAB — LIPID PANEL
CHOL/HDL RATIO: 4
Cholesterol: 256 mg/dL — ABNORMAL HIGH (ref 0–200)
HDL: 57.3 mg/dL (ref >39.00)
LDL CALC: 168 mg/dL — AB (ref 0–99)
NonHDL: 198.66
Triglycerides: 151 mg/dL — ABNORMAL HIGH (ref 0.0–149.0)
VLDL: 30.2 mg/dL (ref 0.0–40.0)

## 2016-03-16 LAB — COMPREHENSIVE METABOLIC PANEL
ALBUMIN: 4.7 g/dL (ref 3.5–5.2)
ALK PHOS: 48 U/L (ref 39–117)
ALT: 23 U/L (ref 0–35)
AST: 16 U/L (ref 0–37)
BILIRUBIN TOTAL: 0.4 mg/dL (ref 0.2–1.2)
BUN: 15 mg/dL (ref 6–23)
CO2: 30 mEq/L (ref 19–32)
Calcium: 9.7 mg/dL (ref 8.4–10.5)
Chloride: 102 mEq/L (ref 96–112)
Creatinine, Ser: 0.86 mg/dL (ref 0.40–1.20)
GFR: 75.26 mL/min (ref >60.00)
Glucose, Bld: 89 mg/dL (ref 70–99)
POTASSIUM: 4.2 meq/L (ref 3.5–5.1)
SODIUM: 141 meq/L (ref 135–145)
TOTAL PROTEIN: 8.1 g/dL (ref 6.0–8.3)

## 2016-03-16 LAB — CBC WITH DIFFERENTIAL/PLATELET
BASOS ABS: 0.1 10*3/uL (ref 0.0–0.1)
Basophils Relative: 0.5 % (ref 0.0–3.0)
EOS PCT: 0.3 % (ref 0.0–5.0)
Eosinophils Absolute: 0 10*3/uL (ref 0.0–0.7)
HCT: 42.9 % (ref 36.0–46.0)
HEMOGLOBIN: 14.5 g/dL (ref 12.0–15.0)
Lymphocytes Relative: 28.7 % (ref 12.0–46.0)
Lymphs Abs: 3.6 10*3/uL (ref 0.7–4.0)
MCHC: 33.8 g/dL (ref 30.0–36.0)
MCV: 92.8 fl (ref 78.0–100.0)
MONO ABS: 0.9 10*3/uL (ref 0.1–1.0)
Monocytes Relative: 7.1 % (ref 3.0–12.0)
Neutro Abs: 8 10*3/uL — ABNORMAL HIGH (ref 1.4–7.7)
Neutrophils Relative %: 63.4 % (ref 43.0–77.0)
Platelets: 377 10*3/uL (ref 150.0–400.0)
RBC: 4.62 Mil/uL (ref 3.87–5.11)
RDW: 13.4 % (ref 11.5–15.5)
WBC: 12.6 10*3/uL — AB (ref 4.0–10.5)

## 2016-03-16 LAB — HEMOGLOBIN A1C: Hgb A1c MFr Bld: 5.5 % (ref 4.6–6.5)

## 2016-03-16 MED ORDER — VALSARTAN-HYDROCHLOROTHIAZIDE 160-12.5 MG PO TABS: 1.0000 | ORAL_TABLET | Freq: Every day | ORAL | 1 refills | Status: DC

## 2016-03-16 MED ORDER — PREGABALIN 75 MG PO CAPS: 75.0000 mg | ORAL_CAPSULE | Freq: Every day | ORAL | 1 refills | Status: DC

## 2016-03-16 MED ORDER — BUPROPION HCL ER (XL) 300 MG PO TB24: 300.0000 mg | ORAL_TABLET | Freq: Every day | ORAL | 3 refills | Status: DC

## 2016-03-16 NOTE — Progress Notes (Signed)
Pre visit review using our clinic review tool, if applicable. No additional management support is needed unless otherwise documented below in the visit note. 

## 2016-03-16 NOTE — Patient Instructions (Signed)
Preventive Care for Adults, Female A healthy lifestyle and preventive care can promote health and wellness. Preventive health guidelines for women include the following key practices.  A routine yearly physical is a good way to check with your health care provider about your health and preventive screening. It is a chance to share any concerns and updates on your health and to receive a thorough exam.  Visit your dentist for a routine exam and preventive care every 6 months. Brush your teeth twice a day and floss once a day. Good oral hygiene prevents tooth decay and gum disease.  The frequency of eye exams is based on your age, health, family medical history, use of contact lenses, and other factors. Follow your health care provider's recommendations for frequency of eye exams.  Eat a healthy diet. Foods like vegetables, fruits, whole grains, low-fat dairy products, and lean protein foods contain the nutrients you need without too many calories. Decrease your intake of foods high in solid fats, added sugars, and salt. Eat the right amount of calories for you.Get information about a proper diet from your health care provider, if necessary.  Regular physical exercise is one of the most important things you can do for your health. Most adults should get at least 150 minutes of moderate-intensity exercise (any activity that increases your heart rate and causes you to sweat) each week. In addition, most adults need muscle-strengthening exercises on 2 or more days a week.  Maintain a healthy weight. The body mass index (BMI) is a screening tool to identify possible weight problems. It provides an estimate of body fat based on height and weight. Your health care provider can find your BMI and can help you achieve or maintain a healthy weight.For adults 20 years and older:  A BMI below 18.5 is considered underweight.  A BMI of 18.5 to 24.9 is normal.  A BMI of 25 to 29.9 is considered overweight.  A  BMI of 30 and above is considered obese.  Maintain normal blood lipids and cholesterol levels by exercising and minimizing your intake of saturated fat. Eat a balanced diet with plenty of fruit and vegetables. Blood tests for lipids and cholesterol should begin at age 45 and be repeated every 5 years. If your lipid or cholesterol levels are high, you are over 50, or you are at high risk for heart disease, you may need your cholesterol levels checked more frequently.Ongoing high lipid and cholesterol levels should be treated with medicines if diet and exercise are not working.  If you smoke, find out from your health care provider how to quit. If you do not use tobacco, do not start.  Lung cancer screening is recommended for adults aged 45-80 years who are at high risk for developing lung cancer because of a history of smoking. A yearly low-dose CT scan of the lungs is recommended for people who have at least a 30-pack-year history of smoking and are a current smoker or have quit within the past 15 years. A pack year of smoking is smoking an average of 1 pack of cigarettes a day for 1 year (for example: 1 pack a day for 30 years or 2 packs a day for 15 years). Yearly screening should continue until the smoker has stopped smoking for at least 15 years. Yearly screening should be stopped for people who develop a health problem that would prevent them from having lung cancer treatment.  If you are pregnant, do not drink alcohol. If you are  breastfeeding, be very cautious about drinking alcohol. If you are not pregnant and choose to drink alcohol, do not have more than 1 drink per day. One drink is considered to be 12 ounces (355 mL) of beer, 5 ounces (148 mL) of wine, or 1.5 ounces (44 mL) of liquor.  Avoid use of street drugs. Do not share needles with anyone. Ask for help if you need support or instructions about stopping the use of drugs.  High blood pressure causes heart disease and increases the risk  of stroke. Your blood pressure should be checked at least every 1 to 2 years. Ongoing high blood pressure should be treated with medicines if weight loss and exercise do not work.  If you are 55-79 years old, ask your health care provider if you should take aspirin to prevent strokes.  Diabetes screening is done by taking a blood sample to check your blood glucose level after you have not eaten for a certain period of time (fasting). If you are not overweight and you do not have risk factors for diabetes, you should be screened once every 3 years starting at age 45. If you are overweight or obese and you are 40-70 years of age, you should be screened for diabetes every year as part of your cardiovascular risk assessment.  Breast cancer screening is essential preventive care for women. You should practice "breast self-awareness." This means understanding the normal appearance and feel of your breasts and may include breast self-examination. Any changes detected, no matter how small, should be reported to a health care provider. Women in their 20s and 30s should have a clinical breast exam (CBE) by a health care provider as part of a regular health exam every 1 to 3 years. After age 40, women should have a CBE every year. Starting at age 40, women should consider having a mammogram (breast X-ray test) every year. Women who have a family history of breast cancer should talk to their health care provider about genetic screening. Women at a high risk of breast cancer should talk to their health care providers about having an MRI and a mammogram every year.  Breast cancer gene (BRCA)-related cancer risk assessment is recommended for women who have family members with BRCA-related cancers. BRCA-related cancers include breast, ovarian, tubal, and peritoneal cancers. Having family members with these cancers may be associated with an increased risk for harmful changes (mutations) in the breast cancer genes BRCA1 and  BRCA2. Results of the assessment will determine the need for genetic counseling and BRCA1 and BRCA2 testing.  Your health care provider may recommend that you be screened regularly for cancer of the pelvic organs (ovaries, uterus, and vagina). This screening involves a pelvic examination, including checking for microscopic changes to the surface of your cervix (Pap test). You may be encouraged to have this screening done every 3 years, beginning at age 21.  For women ages 30-65, health care providers may recommend pelvic exams and Pap testing every 3 years, or they may recommend the Pap and pelvic exam, combined with testing for human papilloma virus (HPV), every 5 years. Some types of HPV increase your risk of cervical cancer. Testing for HPV may also be done on women of any age with unclear Pap test results.  Other health care providers may not recommend any screening for nonpregnant women who are considered low risk for pelvic cancer and who do not have symptoms. Ask your health care provider if a screening pelvic exam is right for   you.  If you have had past treatment for cervical cancer or a condition that could lead to cancer, you need Pap tests and screening for cancer for at least 20 years after your treatment. If Pap tests have been discontinued, your risk factors (such as having a new sexual partner) need to be reassessed to determine if screening should resume. Some women have medical problems that increase the chance of getting cervical cancer. In these cases, your health care provider may recommend more frequent screening and Pap tests.  Colorectal cancer can be detected and often prevented. Most routine colorectal cancer screening begins at the age of 50 years and continues through age 75 years. However, your health care provider may recommend screening at an earlier age if you have risk factors for colon cancer. On a yearly basis, your health care provider may provide home test kits to check  for hidden blood in the stool. Use of a small camera at the end of a tube, to directly examine the colon (sigmoidoscopy or colonoscopy), can detect the earliest forms of colorectal cancer. Talk to your health care provider about this at age 50, when routine screening begins. Direct exam of the colon should be repeated every 5-10 years through age 75 years, unless early forms of precancerous polyps or small growths are found.  People who are at an increased risk for hepatitis B should be screened for this virus. You are considered at high risk for hepatitis B if:  You were born in a country where hepatitis B occurs often. Talk with your health care provider about which countries are considered high risk.  Your parents were born in a high-risk country and you have not received a shot to protect against hepatitis B (hepatitis B vaccine).  You have HIV or AIDS.  You use needles to inject street drugs.  You live with, or have sex with, someone who has hepatitis B.  You get hemodialysis treatment.  You take certain medicines for conditions like cancer, organ transplantation, and autoimmune conditions.  Hepatitis C blood testing is recommended for all people born from 1945 through 1965 and any individual with known risks for hepatitis C.  Practice safe sex. Use condoms and avoid high-risk sexual practices to reduce the spread of sexually transmitted infections (STIs). STIs include gonorrhea, chlamydia, syphilis, trichomonas, herpes, HPV, and human immunodeficiency virus (HIV). Herpes, HIV, and HPV are viral illnesses that have no cure. They can result in disability, cancer, and death.  You should be screened for sexually transmitted illnesses (STIs) including gonorrhea and chlamydia if:  You are sexually active and are younger than 24 years.  You are older than 24 years and your health care provider tells you that you are at risk for this type of infection.  Your sexual activity has changed  since you were last screened and you are at an increased risk for chlamydia or gonorrhea. Ask your health care provider if you are at risk.  If you are at risk of being infected with HIV, it is recommended that you take a prescription medicine daily to prevent HIV infection. This is called preexposure prophylaxis (PrEP). You are considered at risk if:  You are sexually active and do not regularly use condoms or know the HIV status of your partner(s).  You take drugs by injection.  You are sexually active with a partner who has HIV.  Talk with your health care provider about whether you are at high risk of being infected with HIV. If   you choose to begin PrEP, you should first be tested for HIV. You should then be tested every 3 months for as long as you are taking PrEP.  Osteoporosis is a disease in which the bones lose minerals and strength with aging. This can result in serious bone fractures or breaks. The risk of osteoporosis can be identified using a bone density scan. Women ages 67 years and over and women at risk for fractures or osteoporosis should discuss screening with their health care providers. Ask your health care provider whether you should take a calcium supplement or vitamin D to reduce the rate of osteoporosis.  Menopause can be associated with physical symptoms and risks. Hormone replacement therapy is available to decrease symptoms and risks. You should talk to your health care provider about whether hormone replacement therapy is right for you.  Use sunscreen. Apply sunscreen liberally and repeatedly throughout the day. You should seek shade when your shadow is shorter than you. Protect yourself by wearing long sleeves, pants, a wide-brimmed hat, and sunglasses year round, whenever you are outdoors.  Once a month, do a whole body skin exam, using a mirror to look at the skin on your back. Tell your health care provider of new moles, moles that have irregular borders, moles that  are larger than a pencil eraser, or moles that have changed in shape or color.  Stay current with required vaccines (immunizations).  Influenza vaccine. All adults should be immunized every year.  Tetanus, diphtheria, and acellular pertussis (Td, Tdap) vaccine. Pregnant women should receive 1 dose of Tdap vaccine during each pregnancy. The dose should be obtained regardless of the length of time since the last dose. Immunization is preferred during the 27th-36th week of gestation. An adult who has not previously received Tdap or who does not know her vaccine status should receive 1 dose of Tdap. This initial dose should be followed by tetanus and diphtheria toxoids (Td) booster doses every 10 years. Adults with an unknown or incomplete history of completing a 3-dose immunization series with Td-containing vaccines should begin or complete a primary immunization series including a Tdap dose. Adults should receive a Td booster every 10 years.  Varicella vaccine. An adult without evidence of immunity to varicella should receive 2 doses or a second dose if she has previously received 1 dose. Pregnant females who do not have evidence of immunity should receive the first dose after pregnancy. This first dose should be obtained before leaving the health care facility. The second dose should be obtained 4-8 weeks after the first dose.  Human papillomavirus (HPV) vaccine. Females aged 13-26 years who have not received the vaccine previously should obtain the 3-dose series. The vaccine is not recommended for use in pregnant females. However, pregnancy testing is not needed before receiving a dose. If a female is found to be pregnant after receiving a dose, no treatment is needed. In that case, the remaining doses should be delayed until after the pregnancy. Immunization is recommended for any person with an immunocompromised condition through the age of 61 years if she did not get any or all doses earlier. During the  3-dose series, the second dose should be obtained 4-8 weeks after the first dose. The third dose should be obtained 24 weeks after the first dose and 16 weeks after the second dose.  Zoster vaccine. One dose is recommended for adults aged 30 years or older unless certain conditions are present.  Measles, mumps, and rubella (MMR) vaccine. Adults born  before 1957 generally are considered immune to measles and mumps. Adults born in 1957 or later should have 1 or more doses of MMR vaccine unless there is a contraindication to the vaccine or there is laboratory evidence of immunity to each of the three diseases. A routine second dose of MMR vaccine should be obtained at least 28 days after the first dose for students attending postsecondary schools, health care workers, or international travelers. People who received inactivated measles vaccine or an unknown type of measles vaccine during 1963-1967 should receive 2 doses of MMR vaccine. People who received inactivated mumps vaccine or an unknown type of mumps vaccine before 1979 and are at high risk for mumps infection should consider immunization with 2 doses of MMR vaccine. For females of childbearing age, rubella immunity should be determined. If there is no evidence of immunity, females who are not pregnant should be vaccinated. If there is no evidence of immunity, females who are pregnant should delay immunization until after pregnancy. Unvaccinated health care workers born before 1957 who lack laboratory evidence of measles, mumps, or rubella immunity or laboratory confirmation of disease should consider measles and mumps immunization with 2 doses of MMR vaccine or rubella immunization with 1 dose of MMR vaccine.  Pneumococcal 13-valent conjugate (PCV13) vaccine. When indicated, a person who is uncertain of his immunization history and has no record of immunization should receive the PCV13 vaccine. All adults 65 years of age and older should receive this  vaccine. An adult aged 19 years or older who has certain medical conditions and has not been previously immunized should receive 1 dose of PCV13 vaccine. This PCV13 should be followed with a dose of pneumococcal polysaccharide (PPSV23) vaccine. Adults who are at high risk for pneumococcal disease should obtain the PPSV23 vaccine at least 8 weeks after the dose of PCV13 vaccine. Adults older than 46 years of age who have normal immune system function should obtain the PPSV23 vaccine dose at least 1 year after the dose of PCV13 vaccine.  Pneumococcal polysaccharide (PPSV23) vaccine. When PCV13 is also indicated, PCV13 should be obtained first. All adults aged 65 years and older should be immunized. An adult younger than age 65 years who has certain medical conditions should be immunized. Any person who resides in a nursing home or long-term care facility should be immunized. An adult smoker should be immunized. People with an immunocompromised condition and certain other conditions should receive both PCV13 and PPSV23 vaccines. People with human immunodeficiency virus (HIV) infection should be immunized as soon as possible after diagnosis. Immunization during chemotherapy or radiation therapy should be avoided. Routine use of PPSV23 vaccine is not recommended for American Indians, [place] Natives, or people younger than 65 years unless there are medical conditions that require PPSV23 vaccine. When indicated, people who have unknown immunization and have no record of immunization should receive PPSV23 vaccine. One-time revaccination 5 years after the first dose of PPSV23 is recommended for people aged 19-64 years who have chronic kidney failure, nephrotic syndrome, asplenia, or immunocompromised conditions. People who received 1-2 doses of PPSV23 before age 65 years should receive another dose of PPSV23 vaccine at age 65 years or later if at least 5 years have passed since the previous dose. Doses of PPSV23 are not  needed for people immunized with PPSV23 at or after age 65 years.  Meningococcal vaccine. Adults with asplenia or persistent complement component deficiencies should receive 2 doses of quadrivalent meningococcal conjugate (MenACWY-D) vaccine. The doses should be obtained   at least 2 months apart. Microbiologists working with certain meningococcal bacteria, [place] recruits, people at risk during an outbreak, and people who travel to or live in countries with a high rate of meningitis should be immunized. A first-year college student up through age 34 years who is living in a residence hall should receive a dose if she did not receive a dose on or after her 16th birthday. Adults who have certain high-risk conditions should receive one or more doses of vaccine.  Hepatitis A vaccine. Adults who wish to be protected from this disease, have certain high-risk conditions, work with hepatitis A-infected animals, work in hepatitis A research labs, or travel to or work in countries with a high rate of hepatitis A should be immunized. Adults who were previously unvaccinated and who anticipate close contact with an international adoptee during the first 60 days after arrival in the [place] from a country with a high rate of hepatitis A should be immunized.  Hepatitis B vaccine. Adults who wish to be protected from this disease, have certain high-risk conditions, may be exposed to blood or other infectious body fluids, are household contacts or sex partners of hepatitis B positive people, are clients or workers in certain care facilities, or travel to or work in countries with a high rate of hepatitis B should be immunized.  Haemophilus influenzae type b (Hib) vaccine. A previously unvaccinated person with asplenia or sickle cell disease or having a scheduled splenectomy should receive 1 dose of Hib vaccine. Regardless of previous immunization, a recipient of a hematopoietic stem cell transplant should receive a  3-dose series 6-12 months after her successful transplant. Hib vaccine is not recommended for adults with HIV infection. Preventive Services / Frequency Ages 35 to 4 years  Blood pressure check.** / Every 3-5 years.  Lipid and cholesterol check.** / Every 5 years beginning at age 60.  Clinical breast exam.** / Every 3 years for women in their 71s and 10s.  BRCA-related cancer risk assessment.** / For women who have family members with a BRCA-related cancer (breast, ovarian, tubal, or peritoneal cancers).  Pap test.** / Every 2 years from ages 76 through 26. Every 3 years starting at age 61 through age 76 or 93 with a history of 3 consecutive normal Pap tests.  HPV screening.** / Every 3 years from ages 37 through ages 60 to 51 with a history of 3 consecutive normal Pap tests.  Hepatitis C blood test.** / For any individual with known risks for hepatitis C.  Skin self-exam. / Monthly.  Influenza vaccine. / Every year.  Tetanus, diphtheria, and acellular pertussis (Tdap, Td) vaccine.** / Consult your health care provider. Pregnant women should receive 1 dose of Tdap vaccine during each pregnancy. 1 dose of Td every 10 years.  Varicella vaccine.** / Consult your health care provider. Pregnant females who do not have evidence of immunity should receive the first dose after pregnancy.  HPV vaccine. / 3 doses over 6 months, if 93 and younger. The vaccine is not recommended for use in pregnant females. However, pregnancy testing is not needed before receiving a dose.  Measles, mumps, rubella (MMR) vaccine.** / You need at least 1 dose of MMR if you were born in 1957 or later. You may also need a 2nd dose. For females of childbearing age, rubella immunity should be determined. If there is no evidence of immunity, females who are not pregnant should be vaccinated. If there is no evidence of immunity, females who are  pregnant should delay immunization until after pregnancy.  Pneumococcal  13-valent conjugate (PCV13) vaccine.** / Consult your health care provider.  Pneumococcal polysaccharide (PPSV23) vaccine.** / 1 to 2 doses if you smoke cigarettes or if you have certain conditions.  Meningococcal vaccine.** / 1 dose if you are age 68 to 8 years and a Market researcher living in a residence hall, or have one of several medical conditions, you need to get vaccinated against meningococcal disease. You may also need additional booster doses.  Hepatitis A vaccine.** / Consult your health care provider.  Hepatitis B vaccine.** / Consult your health care provider.  Haemophilus influenzae type b (Hib) vaccine.** / Consult your health care provider. Ages 7 to 53 years  Blood pressure check.** / Every year.  Lipid and cholesterol check.** / Every 5 years beginning at age 25 years.  Lung cancer screening. / Every year if you are aged 11-80 years and have a 30-pack-year history of smoking and currently smoke or have quit within the past 15 years. Yearly screening is stopped once you have quit smoking for at least 15 years or develop a health problem that would prevent you from having lung cancer treatment.  Clinical breast exam.** / Every year after age 48 years.  BRCA-related cancer risk assessment.** / For women who have family members with a BRCA-related cancer (breast, ovarian, tubal, or peritoneal cancers).  Mammogram.** / Every year beginning at age 41 years and continuing for as long as you are in good health. Consult with your health care provider.  Pap test.** / Every 3 years starting at age 65 years through age 37 or 70 years with a history of 3 consecutive normal Pap tests.  HPV screening.** / Every 3 years from ages 72 years through ages 60 to 40 years with a history of 3 consecutive normal Pap tests.  Fecal occult blood test (FOBT) of stool. / Every year beginning at age 21 years and continuing until age 5 years. You may not need to do this test if you get  a colonoscopy every 10 years.  Flexible sigmoidoscopy or colonoscopy.** / Every 5 years for a flexible sigmoidoscopy or every 10 years for a colonoscopy beginning at age 35 years and continuing until age 48 years.  Hepatitis C blood test.** / For all people born from [birth year] through 1965 and any individual with known risks for hepatitis C.  Skin self-exam. / Monthly.  Influenza vaccine. / Every year.  Tetanus, diphtheria, and acellular pertussis (Tdap/Td) vaccine.** / Consult your health care provider. Pregnant women should receive 1 dose of Tdap vaccine during each pregnancy. 1 dose of Td every 10 years.  Varicella vaccine.** / Consult your health care provider. Pregnant females who do not have evidence of immunity should receive the first dose after pregnancy.  Zoster vaccine.** / 1 dose for adults aged 30 years or older.  Measles, mumps, rubella (MMR) vaccine.** / You need at least 1 dose of MMR if you were born in 1957 or later. You may also need a second dose. For females of childbearing age, rubella immunity should be determined. If there is no evidence of immunity, females who are not pregnant should be vaccinated. If there is no evidence of immunity, females who are pregnant should delay immunization until after pregnancy.  Pneumococcal 13-valent conjugate (PCV13) vaccine.** / Consult your health care provider.  Pneumococcal polysaccharide (PPSV23) vaccine.** / 1 to 2 doses if you smoke cigarettes or if you have certain conditions.  Meningococcal vaccine.** /  Consult your health care provider.  Hepatitis A vaccine.** / Consult your health care provider.  Hepatitis B vaccine.** / Consult your health care provider.  Haemophilus influenzae type b (Hib) vaccine.** / Consult your health care provider. Ages 61 years and over  Blood pressure check.** / Every year.  Lipid and cholesterol check.** / Every 5 years beginning at age 58 years.  Lung cancer screening. / Every year if you  are aged 32-80 years and have a 30-pack-year history of smoking and currently smoke or have quit within the past 15 years. Yearly screening is stopped once you have quit smoking for at least 15 years or develop a health problem that would prevent you from having lung cancer treatment.  Clinical breast exam.** / Every year after age 52 years.  BRCA-related cancer risk assessment.** / For women who have family members with a BRCA-related cancer (breast, ovarian, tubal, or peritoneal cancers).  Mammogram.** / Every year beginning at age 59 years and continuing for as long as you are in good health. Consult with your health care provider.  Pap test.** / Every 3 years starting at age 81 years through age 16 or 93 years with 3 consecutive normal Pap tests. Testing can be stopped between 65 and 70 years with 3 consecutive normal Pap tests and no abnormal Pap or HPV tests in the past 10 years.  HPV screening.** / Every 3 years from ages 79 years through ages 51 or 38 years with a history of 3 consecutive normal Pap tests. Testing can be stopped between 65 and 70 years with 3 consecutive normal Pap tests and no abnormal Pap or HPV tests in the past 10 years.  Fecal occult blood test (FOBT) of stool. / Every year beginning at age 93 years and continuing until age 61 years. You may not need to do this test if you get a colonoscopy every 10 years.  Flexible sigmoidoscopy or colonoscopy.** / Every 5 years for a flexible sigmoidoscopy or every 10 years for a colonoscopy beginning at age 90 years and continuing until age 78 years.  Hepatitis C blood test.** / For all people born from [birth year] through 1966 and any individual with known risks for hepatitis C.  Osteoporosis screening.** / A one-time screening for women ages 17 years and over and women at risk for fractures or osteoporosis.  Skin self-exam. / Monthly.  Influenza vaccine. / Every year.  Tetanus, diphtheria, and acellular pertussis (Tdap/Td)  vaccine.** / 1 dose of Td every 10 years.  Varicella vaccine.** / Consult your health care provider.  Zoster vaccine.** / 1 dose for adults aged 62 years or older.  Pneumococcal 13-valent conjugate (PCV13) vaccine.** / Consult your health care provider.  Pneumococcal polysaccharide (PPSV23) vaccine.** / 1 dose for all adults aged 47 years and older.  Meningococcal vaccine.** / Consult your health care provider.  Hepatitis A vaccine.** / Consult your health care provider.  Hepatitis B vaccine.** / Consult your health care provider.  Haemophilus influenzae type b (Hib) vaccine.** / Consult your health care provider. ** Family history and personal history of risk and conditions may change your health care provider's recommendations.   This information is not intended to replace advice given to you by your health care provider. Make sure you discuss any questions you have with your health care provider.   Document Released: 06/16/2001 Document Revised: 05/11/2014 Document Reviewed: 09/15/2010 Elsevier Interactive Patient Education [employer].

## 2016-03-16 NOTE — Progress Notes (Signed)
Subjective:  Patient ID: Holly Mcmillan, female    DOB: 10/28/1970  Age: 45 y.o. MRN: [id number]  CC: Annual Exam; Hypertension; Hyperlipidemia; and Hypothyroidism   HPI [name] presents for a CPX.  She is concerned that she might need a higher thyroid dose. She complains of hair loss and weight gain. She did not continue taking BELVIQ because she didn't think it was helping. She denies changes in her appetite, constipation, abdominal pain, or edema.  She also complains of chronic insomnia and what sounds like restless leg syndrome. She describes an achy sensation from the waist down that occurs at night and makes her get up and walk around the room to relieve the sensation. Her husband does not complain that she kicks him during their sleep.  She tells me her blood pressure has been well controlled on valsartan and hydrochlorothiazide. She denies any recent episodes of chest pain, shortness of breath, DOE, CPE, palpitations, edema, or fatigue.  Outpatient Medications Prior to Visit  Medication Sig Dispense Refill  . LO LOESTRIN FE 1 MG-10 MCG / 10 MCG tablet     . acetaminophen-codeine (TYLENOL #3) 300-30 MG tablet Take 1 tablet by mouth every 4 (four) hours as needed for moderate pain. 30 tablet 0  . bacitracin ophthalmic ointment Place 1 application into the left eye 3 (three) times daily. apply to eye 3.5 g 0  . buPROPion (WELLBUTRIN XL) 300 MG 24 hr tablet Take 1 tablet (300 mg total) by mouth daily. 90 tablet 3  . fluticasone (FLONASE) 50 MCG/ACT nasal spray   0  . levothyroxine (SYNTHROID, LEVOTHROID) 175 MCG tablet Take 1 tablet (175 mcg total) by mouth daily before breakfast. 90 tablet 1  . Lorcaserin HCl ER (BELVIQ XR) 20 MG TB24 Take 1 tablet by mouth daily. 30 tablet 5  . valsartan-hydrochlorothiazide (DIOVAN-HCT) 160-12.5 MG tablet Take 1 tablet by mouth daily. 90 tablet 1   No facility-administered medications prior to visit.     ROS Review of Systems    Constitutional: Positive for unexpected weight change. Negative for activity change, appetite change, chills, diaphoresis and fatigue.  HENT: Negative.  Negative for sinus pressure and trouble swallowing.   Eyes: Negative.   Respiratory: Negative.  Negative for cough, choking, chest tightness, shortness of breath and stridor.   Cardiovascular: Negative for chest pain, palpitations and leg swelling.  Gastrointestinal: Negative for abdominal pain, blood in stool, constipation, diarrhea, nausea and vomiting.  Endocrine: Negative.  Negative for cold intolerance and heat intolerance.  Genitourinary: Negative.  Negative for difficulty urinating.  Musculoskeletal: Negative for arthralgias, back pain, joint swelling, myalgias and neck pain.  Skin: Negative.  Negative for color change and rash.  Allergic/Immunologic: Negative.   Neurological: Negative.  Negative for dizziness, tremors, syncope, weakness and light-headedness.  Hematological: Negative.  Negative for adenopathy. Does not bruise/bleed easily.  Psychiatric/Behavioral: Positive for sleep disturbance. Negative for behavioral problems, confusion, decreased concentration, dysphoric mood, hallucinations and suicidal ideas. The patient is not nervous/anxious.     Objective:  BP 140/88 (BP Location: Left Arm, Patient Position: Sitting, Cuff Size: Large)   Pulse 75   Temp 97.8 F (36.6 C) (Oral)   Resp 16   Ht 5\' 2"  (1.575 m)   Wt 208 lb (94.3 kg)   LMP 03/02/2016   SpO2 98%   BMI 38.04 kg/m   BP Readings from Last 3 Encounters:  03/16/16 140/88  11/28/15 112/76  08/27/15 110/60    Wt Readings from Last 3  Encounters:  03/16/16 208 lb (94.3 kg)  11/28/15 199 lb 14.4 oz (90.7 kg)  08/27/15 192 lb (87.1 kg)    Physical Exam  Constitutional: She is oriented to person, place, and time. She appears well-developed and well-nourished. No distress.  HENT:  Head: Normocephalic and atraumatic.  Mouth/Throat: Oropharynx is clear and  moist. No oropharyngeal exudate.  Eyes: Conjunctivae are normal. Right eye exhibits no discharge. Left eye exhibits no discharge. No scleral icterus.  Neck: Normal range of motion. Neck supple. No JVD present. No tracheal deviation present. No thyromegaly present.  Cardiovascular: Normal rate, regular rhythm, normal heart sounds and intact distal pulses.  Exam reveals no gallop and no friction rub.   No murmur heard. Pulmonary/Chest: Effort normal and breath sounds normal. No stridor. No respiratory distress. She has no wheezes. She has no rales. She exhibits no tenderness.  Abdominal: Soft. Bowel sounds are normal. She exhibits no distension and no mass. There is no tenderness. There is no rebound and no guarding.  Musculoskeletal: Normal range of motion. She exhibits no edema, tenderness or deformity.  Lymphadenopathy:    She has no cervical adenopathy.  Neurological: She is oriented to person, place, and time.  Skin: Skin is warm and dry. No rash noted. She is not diaphoretic. No erythema. No pallor.  Psychiatric: She has a normal mood and affect. Her behavior is normal. Judgment and thought content normal.  Vitals reviewed.   Lab Results  Component Value Date   WBC 12.6 (H) 03/16/2016   HGB 14.5 03/16/2016   HCT 42.9 03/16/2016   PLT 377.0 03/16/2016   GLUCOSE 89 03/16/2016   CHOL 256 (H) 03/16/2016   TRIG 151.0 (H) 03/16/2016   HDL 57.30 03/16/2016   LDLDIRECT 164.9 01/15/2014   LDLCALC 168 (H) 03/16/2016   ALT 23 03/16/2016   AST 16 03/16/2016   NA 141 03/16/2016   K 4.2 03/16/2016   CL 102 03/16/2016   CREATININE 0.86 03/16/2016   BUN 15 03/16/2016   CO2 30 03/16/2016   TSH 4.21 03/16/2016   HGBA1C 5.5 03/16/2016    Ct Abdomen Pelvis W Contrast  Result Date: 08/28/2015 CLINICAL DATA:  Left lower quadrant abdominal pain and nausea for 3 days. Question diverticulitis. EXAM: CT ABDOMEN AND PELVIS WITH CONTRAST TECHNIQUE: Multidetector CT imaging of the abdomen and pelvis  was performed using the standard protocol following bolus administration of intravenous contrast. CONTRAST:  150mL ISOVUE-300 IOPAMIDOL (ISOVUE-300) INJECTION 61% COMPARISON:  04/12/2014 CT. FINDINGS: Lower chest: Clear lung bases. No significant pleural or pericardial effusion. Hepatobiliary: The liver is normal in density without focal abnormality. No significant biliary dilatation status post cholecystectomy. Pancreas: Unremarkable. No pancreatic ductal dilatation or surrounding inflammatory changes. Spleen: Normal in size without focal abnormality. Adrenals/Urinary Tract: Both adrenal glands appear normal. 7 mm cyst in the suprahilar lip of the right kidney has mildly enlarged (image 30 of series 2). The kidneys otherwise appear normal. There is no urinary tract calculus or hydronephrosis. The bladder appears unremarkable. Stomach/Bowel: No evidence of bowel wall thickening, distention or surrounding inflammatory change. No evidence of diverticulitis. There is moderate stool throughout the colon. The appendix appears normal. Vascular/Lymphatic: There are no enlarged abdominal or pelvic lymph nodes. No significant vascular findings are present. Reproductive: The uterus and ovaries appear unremarkable. Other: There is stable laxity of the anterior abdominal wall musculature inferior to the umbilicus, right-greater-than-left. There is no ascites or peritoneal nodularity. Musculoskeletal: No acute or significant osseous findings. There is stable mild lumbar spine  degenerative changes associated with a convex left scoliosis. IMPRESSION: 1. No acute findings or explanation for the patient's symptoms. No evidence of diverticulitis. 2. Prominent stool throughout the colon suggesting constipation. The appendix appears normal. 3. Small right renal cyst. 4. Stable laxity of the anterior abdominal wall between the umbilicus and symphysis, without herniated bowel. Electronically Signed   By: Richardean Sale M.D.   On:  08/28/2015 15:04    Assessment & Plan:   Ren was seen today for annual exam, hypertension, hyperlipidemia and hypothyroidism.  Diagnoses and all orders for this visit:  Routine general medical examination at a health care facility- Exam completed, labs ordered and reviewed, her vaccines reviewed and updated, her Pap smears up-to-date, patient education material was given. -     Lipid panel; Future -     Comprehensive metabolic panel; Future -     CBC with Differential/Platelet; Future  Postoperative hypothyroidism- she is symptomatic and has a slightly elevated TSH so I've made an increase in her levothyroxine dose. -     Thyroid Panel With TSH; Future  Hyperglycemia- her A1c is down to 5.5%, improvement noted. -     Hemoglobin A1c; Future  RLS (restless legs syndrome)- will try Lyrica for symptom relief -     pregabalin (LYRICA) 75 MG capsule; Take 1 capsule (75 mg total) by mouth daily.  Depression, major, in remission [facility])- she is doing well on Wellbutrin, will continue -     buPROPion (WELLBUTRIN XL) 300 MG 24 hr tablet; Take 1 tablet (300 mg total) by mouth daily.  Recurrent major depressive disorder, in full remission ([place])  Essential hypertension- her blood pressure is well-controlled, electrolytes and renal function are stable. -     valsartan-hydrochlorothiazide (DIOVAN-HCT) 160-12.5 MG tablet; Take 1 tablet by mouth daily.   I have discontinued Ms. Arzuaga's fluticasone, Lorcaserin HCl ER, acetaminophen-codeine, levothyroxine, and bacitracin. I am also having her start on pregabalin and levothyroxine. Additionally, I am having her maintain her LO LOESTRIN FE, buPROPion, and valsartan-hydrochlorothiazide.  Meds ordered this encounter  Medications  . pregabalin (LYRICA) 75 MG capsule    Sig: Take 1 capsule (75 mg total) by mouth daily.    Dispense:  90 capsule    Refill:  1  . buPROPion (WELLBUTRIN XL) 300 MG 24 hr tablet    Sig: Take 1 tablet (300 mg total) by mouth  daily.    Dispense:  90 tablet    Refill:  3  . valsartan-hydrochlorothiazide (DIOVAN-HCT) 160-12.5 MG tablet    Sig: Take 1 tablet by mouth daily.    Dispense:  90 tablet    Refill:  1  . levothyroxine (SYNTHROID, LEVOTHROID) 200 MCG tablet    Sig: Take 1 tablet (200 mcg total) by mouth daily before breakfast.    Dispense:  90 tablet    Refill:  1     Follow-up: Return in about 6 months (around 09/13/2016).  Scarlette Calico, MD

## 2016-03-17 ENCOUNTER — Encounter: Payer: Self-pay | Admitting: Internal Medicine

## 2016-03-17 LAB — THYROID PANEL WITH TSH
Free Thyroxine Index: 3.1 (ref 1.4–3.8)
T3 UPTAKE: 27 % (ref 22–35)
T4 TOTAL: 11.4 ug/dL (ref 4.5–12.0)
TSH: 4.21 mIU/L

## 2016-03-17 MED ORDER — LEVOTHYROXINE SODIUM 200 MCG PO TABS: 200.0000 ug | ORAL_TABLET | Freq: Every day | ORAL | 1 refills | Status: DC

## 2016-03-18 ENCOUNTER — Telehealth: Payer: Self-pay | Admitting: Emergency Medicine

## 2016-03-18 NOTE — Telephone Encounter (Signed)
Pt informed of results.

## 2016-03-18 NOTE — Assessment & Plan Note (Signed)
Her Framingham risk score is only 4% so I do not recommend that she start taking a statin

## 2016-03-18 NOTE — Telephone Encounter (Signed)
Pt called you back. Asked that you give her a call back when you get a chance. Thanks.

## 2016-04-09 ENCOUNTER — Encounter: Payer: Self-pay | Admitting: Family

## 2016-04-09 ENCOUNTER — Ambulatory Visit (INDEPENDENT_AMBULATORY_CARE_PROVIDER_SITE_OTHER): Payer: Managed Care, Other (non HMO) | Admitting: Family

## 2016-04-09 VITALS — BP 124/82 | HR 77 | Temp 98.3°F | Resp 16 | Ht 62.0 in | Wt 212.0 lb

## 2016-04-09 DIAGNOSIS — M545 Low back pain, unspecified: Secondary | ICD-10-CM

## 2016-04-09 MED ORDER — DICLOFENAC SODIUM 2 % TD SOLN: 1.0000 "application " | Freq: Two times a day (BID) | TRANSDERMAL | 1 refills | Status: DC | PRN

## 2016-04-09 MED ORDER — TIZANIDINE HCL 4 MG PO TABS: 4.0000 mg | ORAL_TABLET | Freq: Four times a day (QID) | ORAL | 0 refills | Status: DC | PRN

## 2016-04-09 MED ORDER — IBUPROFEN-FAMOTIDINE 800-26.6 MG PO TABS: 1.0000 | ORAL_TABLET | Freq: Three times a day (TID) | ORAL | 1 refills | Status: DC | PRN

## 2016-04-09 NOTE — Assessment & Plan Note (Signed)
Acute low back pain consistent with muscle imbalance and possible piriformis syndrome. Treat conservatively with ice and home exercise therapy. Start Pennsaid, Zanaflex, and Duexis. Follow up if symptoms worsen or fail to improve for possible imaging or physical therapy.

## 2016-04-09 NOTE — Progress Notes (Signed)
Subjective:    Patient ID: Holly Mcmillan, female    DOB: August 26, 1970, 45 y.o.   MRN: [id number]  Chief Complaint  Patient presents with  . Back Pain    states that her back has been bothering her for a while but has gotten worse within the last 2 weeks, its located in lower back     HPI:  Holly Mcmillan is a 45 y.o. female who  has a past medical history of Depression; Diabetes mellitus (05/2011); Dysmenorrhea; Gallbladder disease; Headache(784.0); Hypertension; Hypothyroidism; Obesity; and Ovarian cyst. and presents today for an office visit.  This is a new problem. Associated symptoms of pain located in her back have been going on for years with a recent worsening over the last 2 weeks. Pain is described as achy. Has difficulty going from sitting to a standing position. Severity is enough to effect her sleep. Modifying factors include Aleve or Tylenol which has not helped very much. Denies any trauma or injury that she can recall. May have had some radiculopathy. Primarily on the right side. No changes to bowel habits or saddle anesthesia.    Allergies  Allergen Reactions  . Spironolactone     Hair loss  . Sulfonamide Derivatives     REACTION: Hives      Outpatient Medications Prior to Visit  Medication Sig Dispense Refill  . buPROPion (WELLBUTRIN XL) 300 MG 24 hr tablet Take 1 tablet (300 mg total) by mouth daily. 90 tablet 3  . levothyroxine (SYNTHROID, LEVOTHROID) 200 MCG tablet Take 1 tablet (200 mcg total) by mouth daily before breakfast. 90 tablet 1  . LO LOESTRIN FE 1 MG-10 MCG / 10 MCG tablet     . pregabalin (LYRICA) 75 MG capsule Take 1 capsule (75 mg total) by mouth daily. 90 capsule 1  . valsartan-hydrochlorothiazide (DIOVAN-HCT) 160-12.5 MG tablet Take 1 tablet by mouth daily. 90 tablet 1   No facility-administered medications prior to visit.       Past Surgical History:  Procedure Laterality Date  . ablation    . CESAREAN SECTION  2002, 2009  .  CHOLECYSTECTOMY  2005  . NASAL SINUS SURGERY    . THYROID SURGERY  2012      Past Medical History:  Diagnosis Date  . Depression   . Diabetes mellitus 05/2011   Dr. Jacelyn Pi  . Dysmenorrhea   . Gallbladder disease   . Headache(784.0)   . Hypertension   . Hypothyroidism   . Obesity   . Ovarian cyst       Review of Systems  Constitutional: Negative for chills and fever.  Musculoskeletal: Positive for back pain.  Neurological: Negative for weakness and numbness.      Objective:    BP 124/82 (BP Location: Left Arm, Patient Position: Sitting, Cuff Size: Large)   Pulse 77   Temp 98.3 F (36.8 C) (Oral)   Resp 16   Ht 5\' 2"  (1.575 m)   Wt 212 lb (96.2 kg)   LMP 03/02/2016   SpO2 95%   BMI 38.78 kg/m  Nursing note and vital signs reviewed.          Physical Exam  Constitutional: She is oriented to person, place, and time. She appears well-developed and well-nourished. No distress.  Cardiovascular: Normal rate, regular rhythm, normal heart sounds and intact distal pulses.   Pulmonary/Chest: Effort normal and breath sounds normal.  Musculoskeletal:  Low back - no obvious deformity, discoloration, or edema. Mild tenderness with no  crepitus or spasm noted. Range of motion is within the normal ranges. Distal pulses and sensation are intact and appropriate. Positive well's straight leg raise and positive faber's test on the left.   Neurological: She is alert and oriented to person, place, and time.  Skin: Skin is warm and dry.  Psychiatric: She has a normal mood and affect. Her behavior is normal. Judgment and thought content normal.       Assessment & Plan:   Problem List Items Addressed This Visit      Other   Low back pain - Primary    Acute low back pain consistent with muscle imbalance and possible piriformis syndrome. Treat conservatively with ice and home exercise therapy. Start Pennsaid, Zanaflex, and Duexis. Follow up if symptoms worsen or fail to improve  for possible imaging or physical therapy.       Relevant Medications   Ibuprofen-Famotidine 800-26.6 MG TABS   Diclofenac Sodium (PENNSAID) 2 % SOLN   tiZANidine (ZANAFLEX) 4 MG tablet       I am having Ms. Caddell start on Ibuprofen-Famotidine, Diclofenac Sodium, and tiZANidine. I am also having her maintain her LO LOESTRIN FE, pregabalin, buPROPion, valsartan-hydrochlorothiazide, and levothyroxine.   Meds ordered this encounter  Medications  . Ibuprofen-Famotidine 800-26.6 MG TABS    Sig: Take 1 tablet by mouth 3 (three) times daily as needed.    Dispense:  90 tablet    Refill:  1    Order Specific Question:   Supervising Provider    Answer:   Pricilla Holm A [id number]  . Diclofenac Sodium (PENNSAID) 2 % SOLN    Sig: Place 1 application onto the skin 2 (two) times daily as needed.    Dispense:  112 g    Refill:  1    Order Specific Question:   Supervising Provider    Answer:   Pricilla Holm A [id number]  . tiZANidine (ZANAFLEX) 4 MG tablet    Sig: Take 1 tablet (4 mg total) by mouth every 6 (six) hours as needed for muscle spasms.    Dispense:  30 tablet    Refill:  0    Order Specific Question:   Supervising Provider    Answer:   Pricilla Holm A [id number]     Follow-up: Return if symptoms worsen or fail to improve.  Mauricio Po, FNP

## 2016-04-09 NOTE — Patient Instructions (Signed)
Thank you for choosing [facility]!  Ice / moist heat x 20 minutes every 2 hours and as needed or following activity  Pennsaid - Approximately 1/2 packet to the affected site twice daily.  Duexis - 1 tablet 3 times per day for the next 5-7 days and then as needed.  Exercises 1-2 times per day as instructed.   You will receive a call from a pharmacy regarding your Pennsaid/Duexis/Vimovo. The medication will be mailed to you and should cost you no more than $10 per item or possibly free depending upon your insurance.   Your prescription(s) have been submitted to your pharmacy or been printed and provided for you. Please take as directed and contact our office if you believe you are having problem(s) with the medication(s) or have any questions.  If your symptoms worsen or fail to improve, please contact our office for further instruction, or in case of emergency go directly to the emergency room at the closest medical facility.    Low Back Sprain Rehab Ask your health care provider which exercises are safe for you. Do exercises exactly as told by your health care provider and adjust them as directed. It is normal to feel mild stretching, pulling, tightness, or discomfort as you do these exercises, but you should stop right away if you feel sudden pain or your pain gets worse. Do not begin these exercises until told by your health care provider. Stretching and range of motion exercises These exercises warm up your muscles and joints and improve the movement and flexibility of your back. These exercises also help to relieve pain, numbness, and tingling. Exercise A: Lumbar rotation 1. Lie on your back on a firm surface and bend your knees. 2. Straighten your arms out to your sides so each arm forms an "L" shape with a side of your body (a 90 degree angle). 3. Slowly move both of your knees to one side of your body until you feel a stretch in your lower back. Try not to let your shoulders  move off of the floor. 4. Hold for __________ seconds. 5. Tense your abdominal muscles and slowly move your knees back to the starting position. 6. Repeat this exercise on the other side of your body. Repeat __________ times. Complete this exercise __________ times a day. Exercise B: Prone extension on elbows 1. Lie on your abdomen on a firm surface. 2. Prop yourself up on your elbows. 3. Use your arms to help lift your chest up until you feel a gentle stretch in your abdomen and your lower back.  This will place some of your body weight on your elbows. If this is uncomfortable, try stacking pillows under your chest.  Your hips should stay down, against the surface that you are lying on. Keep your hip and back muscles relaxed. 4. Hold for __________ seconds. 5. Slowly relax your upper body and return to the starting position. Repeat __________ times. Complete this exercise __________ times a day. Strengthening exercises These exercises build strength and endurance in your back. Endurance is the ability to use your muscles for a long time, even after they get tired. Exercise C: Pelvic tilt 1. Lie on your back on a firm surface. Bend your knees and keep your feet flat. 2. Tense your abdominal muscles. Tip your pelvis up toward the ceiling and flatten your lower back into the floor.  To help with this exercise, you may place a small towel under your lower back and try to push  your back into the towel. 3. Hold for __________ seconds. 4. Let your muscles relax completely before you repeat this exercise. Repeat __________ times. Complete this exercise __________ times a day. Exercise D: Alternating arm and leg raises 1. Get on your hands and knees on a firm surface. If you are on a hard floor, you may want to use padding to cushion your knees, such as an exercise mat. 2. Line up your arms and legs. Your hands should be below your shoulders, and your knees should be below your hips. 3. Lift  your left leg behind you. At the same time, raise your right arm and straighten it in front of you.  Do not lift your leg higher than your hip.  Do not lift your arm higher than your shoulder.  Keep your abdominal and back muscles tight.  Keep your hips facing the ground.  Do not arch your back.  Keep your balance carefully, and do not hold your breath. 4. Hold for __________ seconds. 5. Slowly return to the starting position and repeat with your right leg and your left arm. Repeat __________ times. Complete this exercise __________ times a day. Exercise E: Abdominal set with straight leg raise 1. Lie on your back on a firm surface. 2. Bend one of your knees and keep your other leg straight. 3. Tense your abdominal muscles and lift your straight leg up, 4-6 inches (10-15 cm) off the ground. 4. Keep your abdominal muscles tight and hold for __________ seconds.  Do not hold your breath.  Do not arch your back. Keep it flat against the ground. 5. Keep your abdominal muscles tense as you slowly lower your leg back to the starting position. 6. Repeat with your other leg. Repeat __________ times. Complete this exercise __________ times a day. Posture and body mechanics   Body mechanics refers to the movements and positions of your body while you do your daily activities. Posture is part of body mechanics. Good posture and healthy body mechanics can help to relieve stress in your body's tissues and joints. Good posture means that your spine is in its natural S-curve position (your spine is neutral), your shoulders are pulled back slightly, and your head is not tipped forward. The following are general guidelines for applying improved posture and body mechanics to your everyday activities. Standing   When standing, keep your spine neutral and your feet about hip-width apart. Keep a slight bend in your knees. Your ears, shoulders, and hips should line up.  When you do a task in which you  stand in one place for a long time, place one foot up on a stable object that is 2-4 inches (5-10 cm) high, such as a footstool. This helps keep your spine neutral. Sitting  When sitting, keep your spine neutral and keep your feet flat on the floor. Use a footrest, if necessary, and keep your thighs parallel to the floor. Avoid rounding your shoulders, and avoid tilting your head forward.  When working at a desk or a computer, keep your desk at a height where your hands are slightly lower than your elbows. Slide your chair under your desk so you are close enough to maintain good posture.  When working at a computer, place your monitor at a height where you are looking straight ahead and you do not have to tilt your head forward or downward to look at the screen. Resting   When lying down and resting, avoid positions that are most painful  for you.  If you have pain with activities such as sitting, bending, stooping, or squatting (flexion-based activities), lie in a position in which your body does not bend very much. For example, avoid curling up on your side with your arms and knees near your chest (fetal position).  If you have pain with activities such as standing for a long time or reaching with your arms (extension-based activities), lie with your spine in a neutral position and bend your knees slightly. Try the following positions:  Lying on your side with a pillow between your knees.  Lying on your back with a pillow under your knees. Lifting   When lifting objects, keep your feet at least shoulder-width apart and tighten your abdominal muscles.  Bend your knees and hips and keep your spine neutral. It is important to lift using the strength of your legs, not your back. Do not lock your knees straight out.  Always ask for help to lift heavy or awkward objects. This information is not intended to replace advice given to you by your health care provider. Make sure you discuss any  questions you have with your health care provider. Document Released: 04/20/2005 Document Revised: 12/26/2015 Document Reviewed: 01/30/2015 Elsevier Interactive Patient Education  2017 [employer]   Piriformis Syndrome Rehab Ask your health care provider which exercises are safe for you. Do exercises exactly as told by your health care provider and adjust them as directed. It is normal to feel mild stretching, pulling, tightness, or discomfort as you do these exercises, but you should stop right away if you feel sudden pain or your pain gets worse.Do not begin these exercises until told by your health care provider. Stretching and range of motion exercises These exercises warm up your muscles and joints and improve the movement and flexibility of your hip and pelvis. These exercises also help to relieve pain, numbness, and tingling. Exercise A: Hip rotators 7. Lie on your back on a firm surface. 8. Pull your left / right knee toward your same shoulder with your left / right hand until your knee is pointing toward the ceiling. Hold your left / right ankle with your other hand. 9. Keeping your knee steady, gently pull your left / right ankle toward your other shoulder until you feel a stretch in your buttocks. 10. Hold this position for __________ seconds. Repeat __________ times. Complete this stretch __________ times a day. Exercise B: Hip extensors 1. Lie on your back on a firm surface. Both of your legs should be straight. 2. Pull your left / right knee to your chest. Hold your leg in this position by holding onto the back of your thigh or the front of your knee. 3. Hold this position for __________ seconds. 4. Slowly return to the starting position. Repeat __________ times. Complete this stretch __________ times a day. Strengthening exercises These exercises build strength and endurance in your hip and thigh muscles. Endurance is the ability to use your muscles for a long time, even  after they get tired. Exercise C: Straight leg raises (hip abductors) 1. Lie on your side with your left / right leg in the top position. Lie so your head, shoulder, knee, and hip line up. Bend your bottom knee to help you balance. 2. Lift your top leg up 4-6 inches (10-15 cm), keeping your toes pointed straight ahead. 3. Hold this position for __________ seconds. 4. Slowly lower your leg to the starting position. Let your muscles relax completely. Repeat __________  times. Complete this exercise__________ times a day. Exercise D: Hip abductors and rotators, quadruped 1. Get on your hands and knees on a firm, lightly padded surface. Your hands should be directly below your shoulders, and your knees should be directly below your hips. 2. Lift your left / right knee out to the side. Keep your knee bent. Do not twist your body. 3. Hold this position for __________ seconds. 4. Slowly lower your leg. Repeat __________ times. Complete this exercise__________ times a day. Exercise E: Straight leg raises (hip extensors) 1. Lie on your abdomen on a bed or a firm surface with a pillow under your hips. 2. Squeeze your buttock muscles and lift your left / right thigh off the bed. Do not let your back arch. 3. Hold this position for __________ seconds. 4. Slowly return to the starting position. Let your muscles relax completely before doing another repetition. Repeat __________ times. Complete this exercise__________ times a day. This information is not intended to replace advice given to you by your health care provider. Make sure you discuss any questions you have with your health care provider. Document Released: 04/20/2005 Document Revised: 12/24/2015 Document Reviewed: 04/02/2015 Elsevier Interactive Patient Education  2017 [employer].

## 2016-05-07 ENCOUNTER — Other Ambulatory Visit: Payer: Self-pay | Admitting: Family

## 2016-05-07 DIAGNOSIS — M545 Low back pain, unspecified: Secondary | ICD-10-CM

## 2016-06-02 ENCOUNTER — Other Ambulatory Visit: Payer: Self-pay | Admitting: Surgery

## 2016-06-17 ENCOUNTER — Encounter (HOSPITAL_BASED_OUTPATIENT_CLINIC_OR_DEPARTMENT_OTHER): Payer: Self-pay | Admitting: *Deleted

## 2016-06-18 ENCOUNTER — Encounter (HOSPITAL_BASED_OUTPATIENT_CLINIC_OR_DEPARTMENT_OTHER)
Admission: RE | Admit: 2016-06-18 | Discharge: 2016-06-18 | Disposition: A | Discharge status: Home / Self Care | Payer: Managed Care, Other (non HMO) | Source: Ambulatory Visit | Attending: Surgery | Admitting: Surgery

## 2016-06-18 DIAGNOSIS — Z01812 Encounter for preprocedural laboratory examination: Secondary | ICD-10-CM | POA: Insufficient documentation

## 2016-06-18 DIAGNOSIS — Z01818 Encounter for other preprocedural examination: Secondary | ICD-10-CM | POA: Insufficient documentation

## 2016-06-18 DIAGNOSIS — L723 Sebaceous cyst: Secondary | ICD-10-CM | POA: Insufficient documentation

## 2016-06-18 LAB — BASIC METABOLIC PANEL
Anion gap: 10 (ref 5–15)
BUN: 14 mg/dL (ref 6–20)
CHLORIDE: 104 mmol/L (ref 101–111)
CO2: 25 mmol/L (ref 22–32)
CREATININE: 0.95 mg/dL (ref 0.44–1.00)
Calcium: 9 mg/dL (ref 8.9–10.3)
GFR calc Af Amer: >60 mL/min (ref >60)
Glucose, Bld: 73 mg/dL (ref 65–99)
Potassium: 3.8 mmol/L (ref 3.5–5.1)
SODIUM: 139 mmol/L (ref 135–145)

## 2016-06-23 NOTE — H&P (Signed)
[facility] 06/02/2016 9:14 AM Location: [facility] Patient #: [id number] DOB: 16-Mar-1971 [facility] / Language: [facility] / Race: White Female   History of Present Illness [facility] A. [name]; 06/02/2016 9:33 AM) Patient words: New-sebaeous cyst R breast.  The patient is a 46 year old female who presents with a complaint of Breast problems. This is a pleasant female referred by Dr. 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 The patient reports that she has had for several years but is more frequently becoming infected. She has had drainage requiring antibiotics several times. She is also diabetic. There is no family history of breast cancer. She has had no other problems with her breast. She denies fevers. She is otherwise without complaints.   Past Surgical History [name], [name]; 06/02/2016 9:15 AM) Cesarean Section - 1  Gallbladder Surgery - Laparoscopic  Thyroid Surgery   Diagnostic Studies History [name], [name]; 06/02/2016 9:15 AM) Colonoscopy  1-5 years ago Mammogram  1-3 years ago Pap Smear  1-5 years ago  Allergies [name], [name]; 06/02/2016 9:17 AM) Sulfa Antibiotics   Medication History [name], [name]; 06/02/2016 9:18 AM) BuPROPion HCl ER (XL) (300MG  Tablet ER 24HR, Oral) Active. Levothyroxine Sodium (200MCG Tablet, Oral) Active. Lo Loestrin Fe (1 MG-10 MCG /10 MCG Tablet, Oral) Active. Valsartan-Hydrochlorothiazide (160-12.5MG  Tablet, Oral) Active. Vitamin B6 (200MG  Tablet, Oral) Active. Vitamin B12 (100MCG Tablet, Oral) Active. Medications Reconciled  Social History [name], [name]; 06/02/2016 9:15 AM) Alcohol use  Occasional alcohol use. Caffeine use  Carbonated beverages, Coffee, Tea. No drug use  Tobacco use  Former smoker.  Family History [name], [name]; 06/02/2016 9:15 AM) Alcohol Abuse  Brother. Cervical Cancer  Mother. Heart Disease   Mother. Hypertension  Father, Mother.  Pregnancy / Birth History [name], [name]; 06/02/2016 9:15 AM) Age at menarche  68 years. Contraceptive History  Oral contraceptives. Gravida  2 Length (months) of breastfeeding  3-6 Maternal age  61-35 Para  2 Regular periods   Other Problems [name], [name]; 06/02/2016 9:15 AM) Depression  High blood pressure  Thyroid Disease     Review of Systems (Chemira Jones [name]; 06/02/2016 9:15 AM) General Not Present- Appetite Loss, Chills, Fatigue, Fever, Night Sweats, Weight Gain and Weight Loss. Skin Not Present- Change in Wart/Mole, Dryness, Hives, Jaundice, New Lesions, Non-Healing Wounds, Rash and Ulcer. HEENT Not Present- Earache, Hearing Loss, Hoarseness, Nose Bleed, Oral Ulcers, Ringing in the Ears, Seasonal Allergies, Sinus Pain, Sore Throat, Visual Disturbances, Wears glasses/contact lenses and Yellow Eyes. Respiratory Not Present- Bloody sputum, Chronic Cough, Difficulty Breathing, Snoring and Wheezing. Breast Not Present- Breast Mass, Breast Pain, Nipple Discharge and Skin Changes. Cardiovascular Not Present- Chest Pain, Difficulty Breathing Lying Down, Leg Cramps, Palpitations, Rapid Heart Rate, Shortness of Breath and Swelling of Extremities. Gastrointestinal Present- Excessive gas and Hemorrhoids. Not Present- Abdominal Pain, Bloating, Bloody Stool, Change in Bowel Habits, Chronic diarrhea, Constipation, Difficulty Swallowing, Gets full quickly at meals, Indigestion, Nausea, Rectal Pain and Vomiting. Female Genitourinary Not Present- Frequency, Nocturia, Painful Urination, Pelvic Pain and Urgency. Musculoskeletal Not Present- Back Pain, Joint Pain, Joint Stiffness, Muscle Pain, Muscle Weakness and Swelling of Extremities. Neurological Not Present- Decreased Memory, Fainting, Headaches, Numbness, Seizures, Tingling, Tremor, Trouble walking and Weakness. Psychiatric Present- Anxiety and Depression. Not Present- Bipolar, Change  in Sleep Pattern, Fearful and Frequent crying. Hematology Not Present- Blood Thinners, Easy Bruising, Excessive bleeding, Gland problems, HIV and Persistent Infections.  Vitals [name] [name]; 06/02/2016 9:17  AM) 06/02/2016 9:16 AM Weight: 215.8 lb Height: 62in Body Surface Area: 1.97 m Body Mass Index: 39.47 kg/m  Temp.: 98.32F(Oral)  Pulse: 84 (Regular)  BP: 110/80 (Sitting, Left Arm, Standard)       Physical Exam (Dody Smartt A. [name]; 06/02/2016 9:34 AM) General Mental Status-Alert. General Appearance-Consistent with stated age. Hydration-Well hydrated. Voice-Normal.  Head and Neck Head-normocephalic, atraumatic with no lesions or palpable masses. Trachea-midline. Thyroid Gland Characteristics - normal size and consistency.  Eye Eyeball - Bilateral-Extraocular movements intact. Sclera/Conjunctiva - Bilateral-No scleral icterus.  Chest and Lung Exam Chest and lung exam reveals -quiet, even and easy respiratory effort with no use of accessory muscles and on auscultation, normal breath sounds, no adventitious sounds and normal vocal resonance. Inspection Chest Wall - Normal. Back - normal.  Breast Breast - Left-Symmetric, Non Tender, No Biopsy scars, no Dimpling, No Inflammation, No Lumpectomy scars, No Mastectomy scars, No Peau d' Orange. Breast - Right-Symmetric, Non Tender, No Biopsy scars, no Dimpling, No Inflammation, No Lumpectomy scars, No Mastectomy scars, No Peau d' Orange. Note: There is a small cystic mass that appears to be in the subcutaneous tissue along the medial inframammary fold of the right breast. It is 1 cm in size  Cardiovascular Cardiovascular examination reveals -normal heart sounds, regular rate and rhythm with no murmurs and normal pedal pulses bilaterally.  Abdomen Inspection Inspection of the abdomen reveals - No Hernias. Skin - Scar - no surgical scars. Palpation/Percussion Palpation and  Percussion of the abdomen reveal - Soft, Non Tender, No Rebound tenderness, No Rigidity (guarding) and No hepatosplenomegaly. Auscultation Auscultation of the abdomen reveals - Bowel sounds normal.  Neurologic - Did not examine.  Musculoskeletal - Did not examine.  Lymphatic Head & Neck  General Head & Neck Lymphatics: Bilateral - Description - Normal. Axillary  General Axillary Region: Bilateral - Description - Normal. Tenderness - Non Tender. Femoral & Inguinal - Did not examine.    Assessment & Plan (Bemnet Trovato A. 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 Surgical excision is recommended to prevent ongoing infection especially in the light of her diabetes. It is also recommended for histologic evaluation throughout malignancy as it is in the breast tissue. I discussed this with her in detail. I discussed the risk of surgery which includes but is not limited to bleeding, infection, and recurrence. She understands was to proceed with surgery which will be scheduled

## 2016-06-24 ENCOUNTER — Ambulatory Visit (HOSPITAL_BASED_OUTPATIENT_CLINIC_OR_DEPARTMENT_OTHER)
Admission: RE | Admit: 2016-06-24 | Discharge: 2016-06-24 | Disposition: A | Discharge status: Home / Self Care | Payer: Managed Care, Other (non HMO) | Source: Ambulatory Visit | Attending: Surgery | Admitting: Surgery

## 2016-06-24 ENCOUNTER — Ambulatory Visit (HOSPITAL_BASED_OUTPATIENT_CLINIC_OR_DEPARTMENT_OTHER): Payer: Managed Care, Other (non HMO) | Admitting: Certified Registered"

## 2016-06-24 ENCOUNTER — Encounter (HOSPITAL_BASED_OUTPATIENT_CLINIC_OR_DEPARTMENT_OTHER)
Admission: RE | Disposition: A | Discharge status: Home / Self Care | Payer: Self-pay | Source: Ambulatory Visit | Attending: Surgery

## 2016-06-24 ENCOUNTER — Encounter (HOSPITAL_BASED_OUTPATIENT_CLINIC_OR_DEPARTMENT_OTHER): Payer: Self-pay | Admitting: *Deleted

## 2016-06-24 DIAGNOSIS — Z79899 Other long term (current) drug therapy: Secondary | ICD-10-CM | POA: Diagnosis not present

## 2016-06-24 DIAGNOSIS — Z87891 Personal history of nicotine dependence: Secondary | ICD-10-CM | POA: Diagnosis not present

## 2016-06-24 DIAGNOSIS — F329 Major depressive disorder, single episode, unspecified: Secondary | ICD-10-CM | POA: Diagnosis not present

## 2016-06-24 DIAGNOSIS — N6081 Other benign mammary dysplasias of right breast: Secondary | ICD-10-CM | POA: Diagnosis present

## 2016-06-24 DIAGNOSIS — Z793 Long term (current) use of hormonal contraceptives: Secondary | ICD-10-CM | POA: Diagnosis not present

## 2016-06-24 DIAGNOSIS — I10 Essential (primary) hypertension: Secondary | ICD-10-CM | POA: Diagnosis not present

## 2016-06-24 DIAGNOSIS — E119 Type 2 diabetes mellitus without complications: Secondary | ICD-10-CM | POA: Diagnosis not present

## 2016-06-24 DIAGNOSIS — N6001 Solitary cyst of right breast: Secondary | ICD-10-CM | POA: Insufficient documentation

## 2016-06-24 DIAGNOSIS — E079 Disorder of thyroid, unspecified: Secondary | ICD-10-CM | POA: Insufficient documentation

## 2016-06-24 HISTORY — PX: BREAST CYST EXCISION: SHX579

## 2016-06-24 SURGERY — EXCISION, CYST, BREAST
Anesthesia: Monitor Anesthesia Care | Site: Breast | Laterality: Right

## 2016-06-24 MED ORDER — CHLORHEXIDINE GLUCONATE CLOTH 2 % EX PADS: 6.0000 | MEDICATED_PAD | Freq: Once | CUTANEOUS | Status: DC

## 2016-06-24 MED ORDER — LIDOCAINE HCL (PF) 1 % IJ SOLN
INTRAMUSCULAR | Status: DC | PRN
  Administered 2016-06-24: 6 mL

## 2016-06-24 MED ORDER — PROPOFOL 10 MG/ML IV BOLUS
INTRAVENOUS | Status: DC | PRN
  Administered 2016-06-24: 40 mg via INTRAVENOUS
  Administered 2016-06-24 (×2): 50 mg via INTRAVENOUS

## 2016-06-24 MED ORDER — ONDANSETRON HCL 4 MG/2ML IJ SOLN
INTRAMUSCULAR | Status: DC | PRN
  Administered 2016-06-24: 4 mg via INTRAVENOUS

## 2016-06-24 MED ORDER — LIDOCAINE HCL (CARDIAC) 20 MG/ML IV SOLN
INTRAVENOUS | Status: DC | PRN
  Administered 2016-06-24: 30 mg via INTRAVENOUS

## 2016-06-24 MED ORDER — LIDOCAINE HCL (PF) 1 % IJ SOLN
INTRAMUSCULAR | Status: AC
  Filled 2016-06-24: qty 30

## 2016-06-24 MED ORDER — DEXAMETHASONE SODIUM PHOSPHATE 10 MG/ML IJ SOLN
INTRAMUSCULAR | Status: AC
Start: 2016-06-24 — End: 2016-06-24
  Filled 2016-06-24: qty 1

## 2016-06-24 MED ORDER — MORPHINE SULFATE (PF) 2 MG/ML IV SOLN: 1.0000 mg | INTRAVENOUS | Status: DC | PRN

## 2016-06-24 MED ORDER — SCOPOLAMINE 1 MG/3DAYS TD PT72: 1.0000 | MEDICATED_PATCH | Freq: Once | TRANSDERMAL | Status: DC | PRN

## 2016-06-24 MED ORDER — SODIUM CHLORIDE 0.9% FLUSH: 3.0000 mL | Freq: Two times a day (BID) | INTRAVENOUS | Status: DC

## 2016-06-24 MED ORDER — FENTANYL CITRATE (PF) 100 MCG/2ML IJ SOLN
INTRAMUSCULAR | Status: DC | PRN
Start: 2016-06-24 — End: 2016-06-24
  Administered 2016-06-24: 50 ug via INTRAVENOUS

## 2016-06-24 MED ORDER — MIDAZOLAM HCL 2 MG/2ML IJ SOLN: 1.0000 mg | INTRAMUSCULAR | Status: DC | PRN

## 2016-06-24 MED ORDER — HYDROMORPHONE HCL 1 MG/ML IJ SOLN: 0.2500 mg | INTRAMUSCULAR | Status: DC | PRN

## 2016-06-24 MED ORDER — ONDANSETRON HCL 4 MG/2ML IJ SOLN: 4.0000 mg | Freq: Once | INTRAMUSCULAR | Status: DC | PRN

## 2016-06-24 MED ORDER — ACETAMINOPHEN 325 MG PO TABS: 650.0000 mg | ORAL_TABLET | ORAL | Status: DC | PRN

## 2016-06-24 MED ORDER — LIDOCAINE 2% (20 MG/ML) 5 ML SYRINGE
INTRAMUSCULAR | Status: AC
  Filled 2016-06-24: qty 5

## 2016-06-24 MED ORDER — CEFAZOLIN SODIUM-DEXTROSE 2-4 GM/100ML-% IV SOLN
2.0000 g | INTRAVENOUS | Status: AC
  Administered 2016-06-24: 2 g via INTRAVENOUS

## 2016-06-24 MED ORDER — ONDANSETRON HCL 4 MG/2ML IJ SOLN
INTRAMUSCULAR | Status: AC
  Filled 2016-06-24: qty 2

## 2016-06-24 MED ORDER — MIDAZOLAM HCL 2 MG/2ML IJ SOLN
INTRAMUSCULAR | Status: DC | PRN
  Administered 2016-06-24: 2 mg via INTRAVENOUS

## 2016-06-24 MED ORDER — FENTANYL CITRATE (PF) 100 MCG/2ML IJ SOLN
INTRAMUSCULAR | Status: AC
  Filled 2016-06-24: qty 2

## 2016-06-24 MED ORDER — MEPERIDINE HCL 25 MG/ML IJ SOLN: 6.2500 mg | INTRAMUSCULAR | Status: DC | PRN

## 2016-06-24 MED ORDER — FENTANYL CITRATE (PF) 100 MCG/2ML IJ SOLN: 50.0000 ug | INTRAMUSCULAR | Status: DC | PRN

## 2016-06-24 MED ORDER — ACETAMINOPHEN 650 MG RE SUPP: 650.0000 mg | RECTAL | Status: DC | PRN

## 2016-06-24 MED ORDER — SODIUM CHLORIDE 0.9 % IV SOLN: 250.0000 mL | INTRAVENOUS | Status: DC | PRN

## 2016-06-24 MED ORDER — OXYCODONE HCL 5 MG PO TABS: 5.0000 mg | ORAL_TABLET | ORAL | Status: DC | PRN

## 2016-06-24 MED ORDER — CEFAZOLIN SODIUM-DEXTROSE 2-4 GM/100ML-% IV SOLN
INTRAVENOUS | Status: AC
  Filled 2016-06-24: qty 100

## 2016-06-24 MED ORDER — SODIUM CHLORIDE 0.9% FLUSH: 3.0000 mL | INTRAVENOUS | Status: DC | PRN

## 2016-06-24 MED ORDER — MIDAZOLAM HCL 2 MG/2ML IJ SOLN
INTRAMUSCULAR | Status: AC
  Filled 2016-06-24: qty 2

## 2016-06-24 MED ORDER — OXYCODONE-ACETAMINOPHEN 5-325 MG PO TABS: 1.0000 | ORAL_TABLET | ORAL | 0 refills | Status: DC | PRN

## 2016-06-24 MED ORDER — LACTATED RINGERS IV SOLN
INTRAVENOUS | Status: DC
  Administered 2016-06-24: 09:00:00 via INTRAVENOUS

## 2016-06-24 SURGICAL SUPPLY — 38 items
BLADE HEX COATED 2.75 (ELECTRODE) ×2 IMPLANT
BLADE SURG 15 STRL LF DISP TIS (BLADE) ×1 IMPLANT
BLADE SURG 15 STRL SS (BLADE) ×1
CANISTER SUCT 1200ML W/VALVE (MISCELLANEOUS) IMPLANT
CHLORAPREP W/TINT 26ML (MISCELLANEOUS) ×2 IMPLANT
CLIP TI WIDE RED SMALL 6 (CLIP) IMPLANT
COVER BACK TABLE 60X90IN (DRAPES) ×2 IMPLANT
COVER MAYO STAND STRL (DRAPES) ×2 IMPLANT
DECANTER SPIKE VIAL GLASS SM (MISCELLANEOUS) IMPLANT
DERMABOND ADVANCED (GAUZE/BANDAGES/DRESSINGS) ×1
DERMABOND ADVANCED .7 DNX12 (GAUZE/BANDAGES/DRESSINGS) ×1 IMPLANT
DEVICE DUBIN W/COMP PLATE 8390 (MISCELLANEOUS) IMPLANT
DRAPE LAPAROTOMY 100X72 PEDS (DRAPES) ×2 IMPLANT
DRAPE UTILITY XL STRL (DRAPES) ×2 IMPLANT
ELECT REM PT RETURN 9FT ADLT (ELECTROSURGICAL) ×2
ELECTRODE REM PT RTRN 9FT ADLT (ELECTROSURGICAL) ×1 IMPLANT
GLOVE SURG SIGNA 7.5 PF LTX (GLOVE) ×2 IMPLANT
GOWN STRL REUS W/ TWL LRG LVL3 (GOWN DISPOSABLE) ×1 IMPLANT
GOWN STRL REUS W/ TWL XL LVL3 (GOWN DISPOSABLE) ×1 IMPLANT
GOWN STRL REUS W/TWL LRG LVL3 (GOWN DISPOSABLE) ×1
GOWN STRL REUS W/TWL XL LVL3 (GOWN DISPOSABLE) ×1
KIT MARKER MARGIN INK (KITS) ×2 IMPLANT
NEEDLE HYPO 25X1 1.5 SAFETY (NEEDLE) ×2 IMPLANT
NS IRRIG 1000ML POUR BTL (IV SOLUTION) ×2 IMPLANT
PACK BASIN DAY SURGERY FS (CUSTOM PROCEDURE TRAY) ×2 IMPLANT
PENCIL BUTTON HOLSTER BLD 10FT (ELECTRODE) ×2 IMPLANT
SLEEVE SCD COMPRESS KNEE MED (MISCELLANEOUS) IMPLANT
SPONGE GAUZE 4X4 12PLY STER LF (GAUZE/BANDAGES/DRESSINGS) ×2 IMPLANT
SPONGE LAP 4X18 X RAY DECT (DISPOSABLE) ×2 IMPLANT
SUT MNCRL AB 4-0 PS2 18 (SUTURE) ×2 IMPLANT
SUT SILK 2 0 SH (SUTURE) ×2 IMPLANT
SUT VIC AB 3-0 SH 27 (SUTURE) ×1
SUT VIC AB 3-0 SH 27X BRD (SUTURE) ×1 IMPLANT
SYR CONTROL 10ML LL (SYRINGE) ×2 IMPLANT
TOWEL OR 17X24 6PK STRL BLUE (TOWEL DISPOSABLE) ×2 IMPLANT
TOWEL OR NON WOVEN STRL DISP B (DISPOSABLE) ×2 IMPLANT
TUBE CONNECTING 20X1/4 (TUBING) IMPLANT
YANKAUER SUCT BULB TIP NO VENT (SUCTIONS) IMPLANT

## 2016-06-24 NOTE — Op Note (Signed)
EXCISION RIGHT BREAST CYST  Procedure Note  DYEMOND WORMAN 06/24/2016   Pre-op Diagnosis: CHRONICALLY INFECTED RIGHT BREAST SEBACEOUS CYST     Post-op Diagnosis: same  Procedure(s): EXCISION RIGHT BREAST CYST (1 cm )  Surgeon(s): Coralie Keens, MD  Anesthesia: Monitor Anesthesia Care  Staff:  Circulator: Lynelle Doctor, RN; Tresa Res, RN Scrub Person: Romero Liner, [place] Circulator Assistant: Glenna Fellows, RN  Estimated Blood Loss: Minimal               Specimens: sent to path  Findings: The patient had an approximate 1 cm right breast chronically infected sebaceous cyst. It was sent to pathology for evaluation.  Procedure: The patient was brought to the operating room and identified as the correct patient. She was placed supine on the operating table and anesthesia was induced. Her right breast was then prepped and draped in the usual sterile fashion. I anesthetized the skin along the inframammary ridge at the palpable cyst with lidocaine. I then made an elliptical incision around the cyst with a scalpel. I then excised the cyst in its entirety including the underlying breast tissue with the electrocautery. This was then sent to pathology for evaluation. I achieved hemostasis with the cautery. I anesthetized the wound further with lidocaine. I then closed the subcutaneous tissue with interrupted 3-0 Vicryl sutures. The skin was closed with a running 4-0 Monocryl suture. Skin glue was then applied. The patient tolerated the procedure well. All the counts were correct at the end procedure. The patient was then taken in a stable condition to the recovery room.          Piera Downs A   Date: 06/24/2016  Time: 10:55 AM

## 2016-06-24 NOTE — Anesthesia Preprocedure Evaluation (Signed)
Anesthesia Evaluation  Patient identified by MRN, date of birth, ID band Patient awake    Reviewed: Allergy & Precautions, NPO status , Patient's Chart, lab work & pertinent test results  Airway Mallampati: I  TM Distance: >3 FB Neck ROM: Full    Dental   Pulmonary former smoker,    Pulmonary exam normal        Cardiovascular hypertension, Pt. on medications Normal cardiovascular exam     Neuro/Psych    GI/Hepatic   Endo/Other    Renal/GU      Musculoskeletal   Abdominal   Peds  Hematology   Anesthesia Other Findings   Reproductive/Obstetrics                             Anesthesia Physical Anesthesia Plan  ASA: II  Anesthesia Plan: MAC   Post-op Pain Management:    Induction: Intravenous  Airway Management Planned: Simple Face Mask  Additional Equipment:   Intra-op Plan:   Post-operative Plan:   Informed Consent: I have reviewed the patients History and Physical, chart, labs and discussed the procedure including the risks, benefits and alternatives for the proposed anesthesia with the patient or authorized representative who has indicated his/her understanding and acceptance.     Plan Discussed with: CRNA and Surgeon  Anesthesia Plan Comments:         Anesthesia Quick Evaluation

## 2016-06-24 NOTE — Transfer of Care (Signed)
Immediate Anesthesia Transfer of Care Note  Patient: [facility]  Procedure(s) Performed: Procedure(s): EXCISION RIGHT BREAST CYST (Right)  Patient Location: PACU  Anesthesia Type:MAC  Level of Consciousness: awake, alert  and oriented  Airway & Oxygen Therapy: Patient Spontanous Breathing and Patient connected to face mask oxygen  Post-op Assessment: Report given to RN and Post -op Vital signs reviewed and stable  Post vital signs: Reviewed and stable  Last Vitals:  Vitals:   06/24/16 0919 06/24/16 1101  BP: 121/72 (!) 102/58  Pulse: 67 77  Resp: 18 (!) 40  Temp: 36.9 C     Last Pain:  Vitals:   06/24/16 0919  TempSrc: Oral      Patients Stated Pain Goal: 0 (90/24/09 [id number])  Complications: No apparent anesthesia complications

## 2016-06-24 NOTE — Discharge Instructions (Signed)
Ok to shower tomorrow  Ice pack ibuprofen and/or tylenol for pain  No vigorous activity for one week     Post Anesthesia Home Care Instructions  Activity: Get plenty of rest for the remainder of the day. A responsible adult should stay with you for 24 hours following the procedure.  For the next 24 hours, DO NOT: -Drive a car -Paediatric nurse -Drink alcoholic beverages -Take any medication unless instructed by your physician -Make any legal decisions or sign important papers.  Meals: Start with liquid foods such as gelatin or soup. Progress to regular foods as tolerated. Avoid greasy, spicy, heavy foods. If nausea and/or vomiting occur, drink only clear liquids until the nausea and/or vomiting subsides. Call your physician if vomiting continues.  Special Instructions/Symptoms: Your throat may feel dry or sore from the anesthesia or the breathing tube placed in your throat during surgery. If this causes discomfort, gargle with warm salt water. The discomfort should disappear within 24 hours.  If you had a scopolamine patch placed behind your ear for the management of post- operative nausea and/or vomiting:  1. The medication in the patch is effective for 72 hours, after which it should be removed.  Wrap patch in a tissue and discard in the trash. Wash hands thoroughly with soap and water. 2. You may remove the patch earlier than 72 hours if you experience unpleasant side effects which may include dry mouth, dizziness or visual disturbances. 3. Avoid touching the patch. Wash your hands with soap and water after contact with the patch.   Call your surgeon if you experience:   1.  Fever over 101.0. 2.  Inability to urinate. 3.  Nausea and/or vomiting. 4.  Extreme swelling or bruising at the surgical site. 5.  Continued bleeding from the incision. 6.  Increased pain, redness or drainage from the incision. 7.  Problems related to your pain medication. 8.  Any problems and/or  concerns

## 2016-06-24 NOTE — Interval H&P Note (Signed)
History and Physical Interval Note: no change in H and P  06/24/2016 9:30 AM  Holly Mcmillan  has presented today for surgery, with the diagnosis of CHRONICALLY INFECTED RIGHT BREAST SEBACEOUS CYST  The various methods of treatment have been discussed with the patient and family. After consideration of risks, benefits and other options for treatment, the patient has consented to  Procedure(s): EXCISION RIGHT BREAST CYST (Right) as a surgical intervention .  The patient's history has been reviewed, patient examined, no change in status, stable for surgery.  I have reviewed the patient's chart and labs.  Questions were answered to the patient's satisfaction.     Shifra Swartzentruber A

## 2016-06-24 NOTE — Anesthesia Postprocedure Evaluation (Signed)
Anesthesia Post Note  Patient: Holly Mcmillan  Procedure(s) Performed: Procedure(s) (LRB): EXCISION RIGHT BREAST CYST (Right)  Patient location during evaluation: PACU Anesthesia Type: MAC Level of consciousness: awake and alert Pain management: pain level controlled Vital Signs Assessment: post-procedure vital signs reviewed and stable Respiratory status: spontaneous breathing, nonlabored ventilation, respiratory function stable and patient connected to nasal cannula oxygen Cardiovascular status: stable and blood pressure returned to baseline Anesthetic complications: no       Last Vitals:  Vitals:   06/24/16 1113 06/24/16 1136  BP: 101/60 117/78  Pulse: 72 72  Resp: 20 18  Temp:  36.6 C    Last Pain:  Vitals:   06/24/16 1113  TempSrc:   PainSc: 0-No pain                 Annetta Deiss DAVID

## 2016-06-25 ENCOUNTER — Encounter (HOSPITAL_BASED_OUTPATIENT_CLINIC_OR_DEPARTMENT_OTHER): Payer: Self-pay | Admitting: Surgery

## 2016-07-09 ENCOUNTER — Ambulatory Visit: Payer: Managed Care, Other (non HMO) | Admitting: Internal Medicine

## 2016-07-14 ENCOUNTER — Ambulatory Visit: Payer: Managed Care, Other (non HMO) | Admitting: Internal Medicine

## 2016-07-15 ENCOUNTER — Ambulatory Visit: Payer: Managed Care, Other (non HMO) | Admitting: Internal Medicine

## 2016-07-16 ENCOUNTER — Ambulatory Visit (INDEPENDENT_AMBULATORY_CARE_PROVIDER_SITE_OTHER): Payer: Managed Care, Other (non HMO) | Admitting: Internal Medicine

## 2016-07-16 ENCOUNTER — Other Ambulatory Visit (INDEPENDENT_AMBULATORY_CARE_PROVIDER_SITE_OTHER): Payer: Managed Care, Other (non HMO)

## 2016-07-16 VITALS — BP 120/72 | HR 93 | Temp 97.8°F | Resp 16 | Ht 62.0 in | Wt 213.1 lb

## 2016-07-16 DIAGNOSIS — E038 Other specified hypothyroidism: Secondary | ICD-10-CM

## 2016-07-16 DIAGNOSIS — I10 Essential (primary) hypertension: Secondary | ICD-10-CM

## 2016-07-16 DIAGNOSIS — M545 Low back pain: Secondary | ICD-10-CM

## 2016-07-16 DIAGNOSIS — G8929 Other chronic pain: Secondary | ICD-10-CM | POA: Diagnosis not present

## 2016-07-16 DIAGNOSIS — K529 Noninfective gastroenteritis and colitis, unspecified: Secondary | ICD-10-CM | POA: Insufficient documentation

## 2016-07-16 DIAGNOSIS — F325 Major depressive disorder, single episode, in full remission: Secondary | ICD-10-CM

## 2016-07-16 LAB — BASIC METABOLIC PANEL
BUN: 16 mg/dL (ref 6–23)
CO2: 25 mEq/L (ref 19–32)
Calcium: 9.2 mg/dL (ref 8.4–10.5)
Chloride: 102 mEq/L (ref 96–112)
Creatinine, Ser: 0.91 mg/dL (ref 0.40–1.20)
GFR: 70.41 mL/min (ref >60.00)
Glucose, Bld: 92 mg/dL (ref 70–99)
POTASSIUM: 3.7 meq/L (ref 3.5–5.1)
SODIUM: 138 meq/L (ref 135–145)

## 2016-07-16 LAB — CBC WITH DIFFERENTIAL/PLATELET
Basophils Absolute: 0 10*3/uL (ref 0.0–0.1)
Basophils Relative: 0.1 % (ref 0.0–3.0)
Eosinophils Absolute: 0.1 10*3/uL (ref 0.0–0.7)
Eosinophils Relative: 0.3 % (ref 0.0–5.0)
HCT: 44.9 % (ref 36.0–46.0)
HEMOGLOBIN: 15.2 g/dL — AB (ref 12.0–15.0)
Lymphocytes Relative: 7 % — ABNORMAL LOW (ref 12.0–46.0)
Lymphs Abs: 1.1 10*3/uL (ref 0.7–4.0)
MCHC: 33.8 g/dL (ref 30.0–36.0)
MCV: 92.1 fl (ref 78.0–100.0)
MONO ABS: 0.6 10*3/uL (ref 0.1–1.0)
Monocytes Relative: 3.7 % (ref 3.0–12.0)
Neutro Abs: 14.2 10*3/uL — ABNORMAL HIGH (ref 1.4–7.7)
Neutrophils Relative %: 88.9 % — ABNORMAL HIGH (ref 43.0–77.0)
PLATELETS: 344 10*3/uL (ref 150.0–400.0)
RBC: 4.88 Mil/uL (ref 3.87–5.11)
RDW: 12.8 % (ref 11.5–15.5)
WBC: 16 10*3/uL — AB (ref 4.0–10.5)

## 2016-07-16 LAB — TSH: TSH: 1.18 u[IU]/mL (ref 0.35–4.50)

## 2016-07-16 MED ORDER — PHENTERMINE HCL 37.5 MG PO CAPS: 37.5000 mg | ORAL_CAPSULE | ORAL | 1 refills | Status: DC

## 2016-07-16 MED ORDER — PROMETHAZINE HCL 12.5 MG PO TABS: 12.5000 mg | ORAL_TABLET | Freq: Four times a day (QID) | ORAL | 0 refills | Status: DC | PRN

## 2016-07-16 MED ORDER — DULOXETINE HCL 30 MG PO CPEP: 30.0000 mg | ORAL_CAPSULE | Freq: Every day | ORAL | 2 refills | Status: DC

## 2016-07-16 NOTE — Progress Notes (Signed)
Subjective:  Patient ID: Holly Mcmillan, female    DOB: December 06, 1970  Age: 46 y.o. MRN: [id number]  CC: Back Pain; Hypertension; and Hypothyroidism   HPI [name] presents for f/up - She is under a significant amount of stress in the workplace and complains of anhedonia, crying spells, and feeling shaky/anxious/nervous/forgetful. She has had some symptom relief with Wellbutrin.  She also tells me that today she has developed nausea and dry heaves. Her daughter was diagnosed and treated for a viral gastritis about 3 days ago. She denies fever, chills, abdominal pain, constipation, or blood in her stools.  She complains of recurrent episodes of right-sided lower back pain that radiates into her right lower extremity. She tells me the pain is worsened by activity and sometimes keeps her awake at night. She has been taking Motrin, ties and tizanidine, and gabapentin for symptom relief.  She complains of weight gain and is having trouble controlling her caloric intake. She wants to try a course of phentermine to help her lose weight.  Outpatient Medications Prior to Visit  Medication Sig Dispense Refill  . buPROPion (WELLBUTRIN XL) 300 MG 24 hr tablet Take 1 tablet (300 mg total) by mouth daily. 90 tablet 3  . levothyroxine (SYNTHROID, LEVOTHROID) 200 MCG tablet Take 1 tablet (200 mcg total) by mouth daily before breakfast. 90 tablet 1  . LO LOESTRIN FE 1 MG-10 MCG / 10 MCG tablet     . pregabalin (LYRICA) 75 MG capsule Take 1 capsule (75 mg total) by mouth daily. 90 capsule 1  . valsartan-hydrochlorothiazide (DIOVAN-HCT) 160-12.5 MG tablet Take 1 tablet by mouth daily. 90 tablet 1  . oxyCODONE-acetaminophen (ROXICET) 5-325 MG tablet Take 1 tablet by mouth every 4 (four) hours as needed for severe pain. 20 tablet 0  . tiZANidine (ZANAFLEX) 4 MG tablet Take 1 tablet (4 mg total) by mouth every 6 (six) hours as needed for muscle spasms. 30 tablet 0   No facility-administered medications prior  to visit.     ROS Review of Systems  Constitutional: Positive for unexpected weight change. Negative for appetite change, chills, diaphoresis and fatigue.  HENT: Negative.   Eyes: Negative for visual disturbance.  Respiratory: Negative for cough, chest tightness, shortness of breath and wheezing.   Cardiovascular: Negative for chest pain, palpitations and leg swelling.  Gastrointestinal: Positive for nausea and vomiting. Negative for abdominal distention, abdominal pain, constipation and diarrhea.  Endocrine: Negative.  Negative for cold intolerance and heat intolerance.  Genitourinary: Negative.   Musculoskeletal: Positive for back pain. Negative for joint swelling, myalgias and neck pain.  Skin: Negative.   Allergic/Immunologic: Negative.   Neurological: Negative.  Negative for dizziness, weakness, light-headedness, numbness and headaches.  Hematological: Negative for adenopathy. Does not bruise/bleed easily.  Psychiatric/Behavioral: Positive for decreased concentration, dysphoric mood and sleep disturbance. Negative for hallucinations, self-injury and suicidal ideas. The patient is nervous/anxious. The patient is not hyperactive.     Objective:  BP 120/72 (BP Location: Left Arm, Patient Position: Sitting, Cuff Size: Large)   Pulse 93   Temp 97.8 F (36.6 C) (Oral)   Ht 5\' 2"  (1.575 m)   Wt 213 lb 1.3 oz (96.7 kg)   SpO2 97%   BMI 38.97 kg/m   BP Readings from Last 3 Encounters:  07/16/16 120/72  06/24/16 117/78  04/09/16 124/82    Wt Readings from Last 3 Encounters:  07/16/16 213 lb 1.3 oz (96.7 kg)  06/24/16 211 lb (95.7 kg)  04/09/16 212 lb (  96.2 kg)    Physical Exam  Constitutional: She is oriented to person, place, and time.  Non-toxic appearance. She does not have a sickly appearance. She does not appear ill. No distress.  HENT:  Mouth/Throat: Oropharynx is clear and moist. No oropharyngeal exudate.  Eyes: Conjunctivae are normal. Right eye exhibits no  discharge. Left eye exhibits no discharge. No scleral icterus.  Neck: Normal range of motion. Neck supple. No JVD present. No tracheal deviation present. No thyromegaly present.  Cardiovascular: Normal rate, regular rhythm, normal heart sounds and intact distal pulses.  Exam reveals no gallop and no friction rub.   No murmur heard. Pulmonary/Chest: Effort normal and breath sounds normal. No stridor. No respiratory distress. She has no wheezes. She has no rales. She exhibits no tenderness.  Abdominal: Soft. Bowel sounds are normal. She exhibits no distension and no mass. There is no tenderness. There is no rebound and no guarding.  Musculoskeletal: Normal range of motion. She exhibits no edema, tenderness or deformity.  Lymphadenopathy:    She has no cervical adenopathy.  Neurological: She is oriented to person, place, and time.  Skin: Skin is warm and dry. No rash noted. She is not diaphoretic. No erythema. No pallor.  Psychiatric: Her mood appears anxious. Her affect is not angry, not blunt, not labile and not inappropriate. Her speech is not rapid and/or pressured, not delayed, not tangential and not slurred. She is not agitated, not slowed and not withdrawn. She exhibits a depressed mood. She expresses no homicidal and no suicidal ideation. She expresses no suicidal plans and no homicidal plans. She is communicative.  She is tearful and anxious She is attentive.  Vitals reviewed.   Lab Results  Component Value Date   WBC 16.0 (H) 07/16/2016   HGB 15.2 (H) 07/16/2016   HCT 44.9 07/16/2016   PLT 344.0 07/16/2016   GLUCOSE 92 07/16/2016   CHOL 256 (H) 03/16/2016   TRIG 151.0 (H) 03/16/2016   HDL 57.30 03/16/2016   LDLDIRECT 164.9 01/15/2014   LDLCALC 168 (H) 03/16/2016   ALT 23 03/16/2016   AST 16 03/16/2016   NA 138 07/16/2016   K 3.7 07/16/2016   CL 102 07/16/2016   CREATININE 0.91 07/16/2016   BUN 16 07/16/2016   CO2 25 07/16/2016   TSH 1.18 07/16/2016   HGBA1C 5.5 03/16/2016      No results found.  Assessment & Plan:   Dustyn was seen today for back pain, hypertension and hypothyroidism.  Diagnoses and all orders for this visit:  Other specified hypothyroidism- her TSH is in the normal range of asked her to remain on the current dose of levothyroxine. -     TSH; Future  Essential hypertension- her blood pressure is adequately well controlled on the ARB/thiazide diuretic combination. Electrolytes and renal function are normal. -     CBC with Differential/Platelet; Future -     Basic metabolic panel; Future  Depression, major, in remission ([place])- I think she would benefit from starting an SNRI, will start duloxetine 30 mg a day and I anticipate increasing the dose of the next few months. -     DULoxetine (CYMBALTA) 30 MG capsule; Take 1 capsule (30 mg total) by mouth daily.  Gastroenteritis, acute- will treat the symptoms with Phenergan as needed, she will let me know if she develops any new or worsening symptoms. -     promethazine (PHENERGAN) 12.5 MG tablet; Take 1 tablet (12.5 mg total) by mouth every 6 (six) hours as  needed for nausea or vomiting.  Chronic right-sided low back pain, with sciatica presence unspecified -     Ambulatory referral to Sports Medicine  Severe obesity (BMI >= 40) ([place])- in addition to lifestyle modifications she will start phentermine to treat obesity. -     phentermine 37.5 MG capsule; Take 1 capsule (37.5 mg total) by mouth every morning.   I have discontinued Ms. Leite's tiZANidine and oxyCODONE-acetaminophen. I am also having her start on DULoxetine, promethazine, and phentermine. Additionally, I am having her maintain her LO LOESTRIN FE, pregabalin, buPROPion, valsartan-hydrochlorothiazide, and levothyroxine.  Meds ordered this encounter  Medications  . DULoxetine (CYMBALTA) 30 MG capsule    Sig: Take 1 capsule (30 mg total) by mouth daily.    Dispense:  30 capsule    Refill:  2  . promethazine (PHENERGAN) 12.5 MG  tablet    Sig: Take 1 tablet (12.5 mg total) by mouth every 6 (six) hours as needed for nausea or vomiting.    Dispense:  10 tablet    Refill:  0  . phentermine 37.5 MG capsule    Sig: Take 1 capsule (37.5 mg total) by mouth every morning.    Dispense:  30 capsule    Refill:  1     Follow-up: Return in about 2 months (around 09/15/2016).  Scarlette Calico, MD

## 2016-07-16 NOTE — Patient Instructions (Signed)

## 2016-07-16 NOTE — Progress Notes (Signed)
Pre visit review using our clinic review tool, if applicable. No additional management support is needed unless otherwise documented below in the visit note. 

## 2016-07-17 ENCOUNTER — Encounter: Payer: Self-pay | Admitting: Internal Medicine

## 2016-07-18 ENCOUNTER — Encounter: Payer: Self-pay | Admitting: Internal Medicine

## 2016-08-15 NOTE — Progress Notes (Signed)
[name] [address], [place] [address] Phone: [phone] Subjective:    I'm seeing this patient by the request  of:  Scarlette Calico, MD   CC: Low back pain and hip pain   XKG:[id number]  Holly Mcmillan is a 46 y.o. female coming in with complaint of low back pain. Patient states it's mostly seems to be on the right side. Has had this pain intermittently for quite some time. Seems to be worsening since December. States more recently and has been radiating down the right lower extremity.  Can wake her up at night. Affecting daily activities. Has been trying over-the-counter medications. Patient also has gabapentin as well as a muscle relaxer that does give her some relief. Does not remember any true injury. Rates the severity of pain a 6 out of 10. Seems to be more in the right buttocks Patient states that this is where the pain is the most. Seems to get better with the activity.     Past Medical History:  Diagnosis Date  . Depression   . Dysmenorrhea   . Gallbladder disease   . Headache(784.0)   . Hypertension   . Hypothyroidism   . Obesity   . Ovarian cyst    Past Surgical History:  Procedure Laterality Date  . ablation    . BREAST CYST EXCISION Right 06/24/2016   Procedure: EXCISION RIGHT BREAST CYST;  Surgeon: Coralie Keens, MD;  Location: [facility];  Service: General;  Laterality: Right;  . CESAREAN SECTION  2002, 2009  . CHOLECYSTECTOMY  2005  . NASAL SINUS SURGERY    . THYROID SURGERY  2012   Social History   Social History  . Marital status: Married    Spouse name: N/A  . Number of children: 2  . Years of education: N/A   Occupational History  . [employer]   Social History Main Topics  . Smoking status: Former Smoker    Packs/day: 0.30    Years: 20.00    Types: Cigarettes  . Smokeless tobacco: Never Used  . Alcohol use No  . Drug use: No  . Sexual activity: Not Currently    Other Topics Concern  . None   Social History Narrative  . None   Allergies  Allergen Reactions  . Spironolactone     Hair loss  . Sulfonamide Derivatives Hives   Family History  Problem Relation Age of Onset  . Alcohol abuse Paternal Uncle     x2  . Arthritis Father   . Hypertension Father   . Cancer Other   . Heart disease Mother     CHF, A Fib  . Hypertension Mother   . Cervical cancer Mother   . Leukemia Mother   . Colon cancer Maternal Aunt   . Alcohol abuse Maternal Uncle     x2  . Heart disease Maternal Grandfather   . Pancreatic cancer Maternal Aunt   . Lung cancer Maternal Uncle     Past medical history, social, surgical and family history all reviewed in electronic medical record.  No pertanent information unless stated regarding to the chief complaint.   Review of Systems:Review of systems updated and as accurate as of 08/17/16  No headache, visual changes, nausea, vomiting, diarrhea, constipation, dizziness, abdominal pain, skin rash, fevers, chills, night sweats, weight loss, swollen lymph nodes, body aches, joint swelling,  chest pain, shortness of breath, mood changes. Positive muscle aches  Objective  Blood pressure 122/82, pulse 82, resp. rate 14, height 5\' 2"  (1.575 m), weight 214 lb (97.1 kg), SpO2 99 %. Systems examined below as of 08/17/16   General: No apparent distress alert and oriented x3 mood and affect normal, dressed appropriately.  HEENT: Pupils equal, extraocular movements intact  Respiratory: Patient's speak in full sentences and does not appear short of breath  Cardiovascular: No lower extremity edema, non tender, no erythema  Skin: Warm dry intact with no signs of infection or rash on extremities or on axial skeleton.  Abdomen: Soft nontender  Neuro: Cranial nerves II through XII are intact, neurovascularly intact in all extremities with 2+ DTRs and 2+ pulses.  Lymph: No lymphadenopathy of posterior or anterior cervical chain or  axillae bilaterally.  Gait normal with good balance and coordination.  MSK:  Non tender with full range of motion and good stability and symmetric strength and tone of shoulders, elbows, wrist, hip, knee and ankles bilaterally.  Back Exam:  Inspection: Unremarkable  Motion: Flexion 45 deg, Extension 25 deg, Side Bending to 45 deg bilaterally,  Rotation to 45 deg bilaterally  SLR laying: Negative  XSLR laying: Negative  Palpable tenderness: Tender to palpation in the paraspinal musculature. More on the right side. Significant pain over the piriformis muscle. FABER: Positive Faber. Sensory change: Gross sensation intact to all lumbar and sacral dermatomes.  Reflexes: 2+ at both patellar tendons, 2+ at achilles tendons, Babinski's downgoing.  Strength at foot  Plantar-flexion: 5/5 Dorsi-flexion: 5/5 Eversion: 5/5 Inversion: 5/5  Leg strength  Quad: 5/5 Hamstring: 5/5 Hip flexor: 5/5 Hip abductors: 4/5  Gait unremarkable.  Procedure note 09470; 15 minutes spent for Therapeutic exercises as stated in above notes.  This included exercises focusing on stretching, strengthening, with significant focus on eccentric aspects. Piriformis Syndrome  Using an anatomical model, reviewed with the patient the structures involved and how they related to diagnosis. The patient indicated understanding.   The patient was given a handout from Dr. Arne Cleveland book "The Sports Medicine Patient Advisor" describing the anatomy and rehabilitation of the following condition: Piriformis Syndrome  Also given a handout with more extensive Piriformis stretching, hip flexor and abductor strengthening, ham stretching  Rec deep massage, explained self-massage with ball   Proper technique shown and discussed handout in great detail with ATC.  All questions were discussed and answered.      Impression and Recommendations:     This case required medical decision making of moderate complexity.      Note: This  dictation was prepared with Dragon dictation along with smaller phrase technology. Any transcriptional errors that result from this process are unintentional.

## 2016-08-17 ENCOUNTER — Encounter: Payer: Self-pay | Admitting: Family Medicine

## 2016-08-17 ENCOUNTER — Ambulatory Visit (INDEPENDENT_AMBULATORY_CARE_PROVIDER_SITE_OTHER): Payer: Managed Care, Other (non HMO) | Admitting: Family Medicine

## 2016-08-17 ENCOUNTER — Ambulatory Visit (INDEPENDENT_AMBULATORY_CARE_PROVIDER_SITE_OTHER)
Admission: RE | Admit: 2016-08-17 | Discharge: 2016-08-17 | Disposition: A | Discharge status: Home / Self Care | Payer: Managed Care, Other (non HMO) | Source: Ambulatory Visit | Attending: Family Medicine | Admitting: Family Medicine

## 2016-08-17 VITALS — BP 122/82 | HR 82 | Resp 14 | Ht 62.0 in | Wt 214.0 lb

## 2016-08-17 DIAGNOSIS — G8929 Other chronic pain: Secondary | ICD-10-CM | POA: Diagnosis not present

## 2016-08-17 DIAGNOSIS — G5701 Lesion of sciatic nerve, right lower limb: Secondary | ICD-10-CM | POA: Diagnosis not present

## 2016-08-17 DIAGNOSIS — M5441 Lumbago with sciatica, right side: Secondary | ICD-10-CM

## 2016-08-17 MED ORDER — GABAPENTIN 100 MG PO CAPS: 200.0000 mg | ORAL_CAPSULE | Freq: Every day | ORAL | 3 refills | Status: DC

## 2016-08-17 NOTE — Assessment & Plan Note (Signed)
Piriformis Syndrome  Using an anatomical model, reviewed with the patient the structures involved and how they related to diagnosis. The patient indicated understanding.   The patient was given a handout from Dr. Arne Cleveland book "The Sports Medicine Patient Advisor" describing the anatomy and rehabilitation of the following condition: Piriformis Syndrome  Also given a handout with more extensive Piriformis stretching, hip flexor and abductor strengthening, ham stretching  Rec deep massage, explained self-massage with ball Discussed that worsening symptoms to seek medical attention immediately. Otherwise we'll see patient back again in 4-6 weeks gabapentin prescribed. Not taking the Lyrica.

## 2016-08-17 NOTE — Progress Notes (Signed)
Pre-visit discussion using our clinic review tool. No additional management support is needed unless otherwise documented below in the visit note.  

## 2016-08-17 NOTE — Patient Instructions (Addendum)
Good to see you.  Ice 20 minutes 2 times daily. Usually after activity and before bed. Tennis ball in back right pocket with sitting Exercises 3 times a week.  Gabapentin 200mg  at night You should do well otherwise I have a lot of tricks See me again in 4-6 weeks.

## 2016-08-17 NOTE — Assessment & Plan Note (Signed)
Low back pain seems to be more of a piriformis. We discussed the importance of weight loss, home exercises and patient work with Product/process development scientist. Patient given home exercises in a typical be beneficial. Patient will come back and see me again in 4-6 weeks for further evaluation and treatment. X-rays pending for lower back.

## 2016-08-19 ENCOUNTER — Telehealth: Payer: Self-pay | Admitting: Internal Medicine

## 2016-08-19 NOTE — Telephone Encounter (Signed)
Pt states DULoxetine (CYMBALTA) 30 MG capsule  makes her tired and she has stopped taking it. Wonder if there is another option for her

## 2016-08-19 NOTE — Telephone Encounter (Signed)
Pt states gabapentin (NEURONTIN) 100 MG gives her a migraine and wondfers if there is something else she can take.

## 2016-08-20 ENCOUNTER — Ambulatory Visit: Payer: Managed Care, Other (non HMO) | Admitting: Family Medicine

## 2016-08-20 ENCOUNTER — Ambulatory Visit (INDEPENDENT_AMBULATORY_CARE_PROVIDER_SITE_OTHER): Payer: Managed Care, Other (non HMO) | Admitting: Adult Health

## 2016-08-20 VITALS — BP 124/80 | Temp 98.2°F | Wt 212.6 lb

## 2016-08-20 DIAGNOSIS — G43011 Migraine without aura, intractable, with status migrainosus: Secondary | ICD-10-CM

## 2016-08-20 MED ORDER — KETOROLAC TROMETHAMINE 60 MG/2ML IM SOLN
60.0000 mg | Freq: Once | INTRAMUSCULAR | Status: AC
  Administered 2016-08-20: 60 mg via INTRAMUSCULAR

## 2016-08-20 NOTE — Telephone Encounter (Signed)
Spoke with patient. She saw a provider yesterday and they said the migraine didn't come from the gabapentin. She still has pain in her hip and wants to give gabapentin a try again. She will call back if it brings on a headache once again.

## 2016-08-20 NOTE — Patient Instructions (Signed)
It was great meeting you today! I am sorry you are feeling badly.   Please stop the phentermine.   Do not take Motrin/Aleve/Advil for 8 hours after receiving the shot of Toradol.   Follow up with PCP if you continue to experience migraines

## 2016-08-20 NOTE — Telephone Encounter (Signed)
Not really Could increase her cymbalta would be the only thing.

## 2016-08-20 NOTE — Progress Notes (Signed)
Subjective:    Patient ID: Holly Mcmillan, female    DOB: 1970/12/06, 46 y.o.   MRN: [id number]  HPI  46 year old female who  has a past medical history of Depression; Dysmenorrhea; Gallbladder disease; Headache(784.0); Hypertension; Hypothyroidism; Obesity; and Ovarian cyst. She is a patient of Dr. Ronnald Ramp,  who I am seeing today for the first time. She presents to the office today with the complaint of Migraine headache x 3 days. She has a history of migraines. She believes that the contributing factor is that of gabapentin. She was started on this medication 4 days ago and then when she woke up the next morning she had a migraine  Migraine is presents " all over my head, but mostly behind my eyes." Pain is described as stabbing and dull.   She denies blurred vision, decreased vision, or red eye  She also reports nausea but no vomiting.   She has been tried using Motrin and Goodies powder.    Review of Systems Negative unless mentioned above. See HPI  Past Medical History:  Diagnosis Date  . Depression   . Dysmenorrhea   . Gallbladder disease   . Headache(784.0)   . Hypertension   . Hypothyroidism   . Obesity   . Ovarian cyst     Social History   Social History  . Marital status: Married    Spouse name: N/A  . Number of children: 2  . Years of education: N/A   Occupational History  . [employer]   Social History Main Topics  . Smoking status: Former Smoker    Packs/day: 0.30    Years: 20.00    Types: Cigarettes  . Smokeless tobacco: Never Used  . Alcohol use No  . Drug use: No  . Sexual activity: Not Currently   Other Topics Concern  . Not on file   Social History Narrative  . No narrative on file    Past Surgical History:  Procedure Laterality Date  . ablation    . BREAST CYST EXCISION Right 06/24/2016   Procedure: EXCISION RIGHT BREAST CYST;  Surgeon: Coralie Keens, MD;  Location: [facility];  Service: General;   Laterality: Right;  . CESAREAN SECTION  2002, 2009  . CHOLECYSTECTOMY  2005  . NASAL SINUS SURGERY    . THYROID SURGERY  2012    Family History  Problem Relation Age of Onset  . Alcohol abuse Paternal Uncle     x2  . Arthritis Father   . Hypertension Father   . Cancer Other   . Heart disease Mother     CHF, A Fib  . Hypertension Mother   . Cervical cancer Mother   . Leukemia Mother   . Colon cancer Maternal Aunt   . Alcohol abuse Maternal Uncle     x2  . Heart disease Maternal Grandfather   . Pancreatic cancer Maternal Aunt   . Lung cancer Maternal Uncle     Allergies  Allergen Reactions  . Spironolactone     Hair loss  . Sulfonamide Derivatives Hives    Current Outpatient Prescriptions on File Prior to Visit  Medication Sig Dispense Refill  . buPROPion (WELLBUTRIN XL) 300 MG 24 hr tablet Take 1 tablet (300 mg total) by mouth daily. 90 tablet 3  . DULoxetine (CYMBALTA) 30 MG capsule Take 1 capsule (30 mg total) by mouth daily. 30 capsule 2  . gabapentin (NEURONTIN) 100 MG capsule Take 2 capsules (200 mg  total) by mouth at bedtime. 60 capsule 3  . levothyroxine (SYNTHROID, LEVOTHROID) 200 MCG tablet Take 1 tablet (200 mcg total) by mouth daily before breakfast. 90 tablet 1  . LO LOESTRIN FE 1 MG-10 MCG / 10 MCG tablet     . phentermine 37.5 MG capsule Take 1 capsule (37.5 mg total) by mouth every morning. 30 capsule 1  . pregabalin (LYRICA) 75 MG capsule Take 1 capsule (75 mg total) by mouth daily. 90 capsule 1  . promethazine (PHENERGAN) 12.5 MG tablet Take 1 tablet (12.5 mg total) by mouth every 6 (six) hours as needed for nausea or vomiting. 10 tablet 0  . valsartan-hydrochlorothiazide (DIOVAN-HCT) 160-12.5 MG tablet Take 1 tablet by mouth daily. 90 tablet 1   No current facility-administered medications on file prior to visit.     BP 124/80 (BP Location: Left Arm, Patient Position: Sitting, Cuff Size: Large)   Temp 98.2 F (36.8 C) (Oral)   Wt 212 lb 9.6 oz  (96.4 kg)   BMI 38.89 kg/m       Objective:   Physical Exam  Constitutional: She is oriented to person, place, and time. She appears well-developed and well-nourished. No distress.  Eyes: Conjunctivae and EOM are normal. Pupils are equal, round, and reactive to light. Right eye exhibits no discharge. Left eye exhibits no discharge.  Neurological: She is alert and oriented to person, place, and time.  Skin: Skin is warm and dry. No rash noted. She is not diaphoretic. No erythema. No pallor.  Psychiatric: She has a normal mood and affect. Her behavior is normal. Judgment and thought content normal.  Nursing note and vitals reviewed.     Assessment & Plan:  1. Intractable migraine without aura and with status migrainosus - Discussed possible causes of her migraine. She was also recently started on Phentermine. I explained to Matalynn that Gabapentin is often used to treat migraines and it is more likely caused from phentermine.  - ketorolac (TORADOL) injection 60 mg; Inject 2 mLs (60 mg total) into the muscle once. - She responded well to high flow oxygen in the office. She reports marked decrease in intensity of migraine headache.  - Stop phentermine - Follow up with PCP as needed   Dorothyann Peng, NP

## 2016-09-02 ENCOUNTER — Other Ambulatory Visit: Payer: Self-pay | Admitting: Internal Medicine

## 2016-09-02 DIAGNOSIS — M545 Low back pain, unspecified: Secondary | ICD-10-CM

## 2016-09-12 ENCOUNTER — Telehealth: Payer: Self-pay | Admitting: Internal Medicine

## 2016-09-12 DIAGNOSIS — F325 Major depressive disorder, single episode, in full remission: Secondary | ICD-10-CM

## 2016-09-14 NOTE — Telephone Encounter (Signed)
erx sent

## 2016-09-14 NOTE — Telephone Encounter (Signed)
Patient is scheduled for the 24th.

## 2016-09-21 ENCOUNTER — Ambulatory Visit (INDEPENDENT_AMBULATORY_CARE_PROVIDER_SITE_OTHER): Payer: 59 | Admitting: Family Medicine

## 2016-09-21 ENCOUNTER — Ambulatory Visit (INDEPENDENT_AMBULATORY_CARE_PROVIDER_SITE_OTHER)
Admission: RE | Admit: 2016-09-21 | Discharge: 2016-09-21 | Disposition: A | Discharge status: Home / Self Care | Payer: 59 | Source: Ambulatory Visit | Attending: Family Medicine | Admitting: Family Medicine

## 2016-09-21 ENCOUNTER — Ambulatory Visit: Payer: Self-pay

## 2016-09-21 ENCOUNTER — Encounter: Payer: Self-pay | Admitting: Family Medicine

## 2016-09-21 VITALS — BP 120/78 | HR 77 | Ht 62.0 in | Wt 211.0 lb

## 2016-09-21 DIAGNOSIS — M25551 Pain in right hip: Secondary | ICD-10-CM

## 2016-09-21 DIAGNOSIS — G5701 Lesion of sciatic nerve, right lower limb: Secondary | ICD-10-CM | POA: Diagnosis not present

## 2016-09-21 NOTE — Patient Instructions (Addendum)
Good to see you  Overall not too bad.  We injected the piriformis injection today I hope it helps.  We will get xray of the hip as well.  This can take up to 2 weeks to help  Continue the vitamins  See me again in 3 weeks.

## 2016-09-21 NOTE — Progress Notes (Signed)
[name] [address], [place] [address] Phone: [phone] Subjective:    I'm seeing this patient by the request  of:  [name], MD   CC: Low back pain and hip pain f/u  [id number]  Holly Mcmillan is a 46 y.o. female coming in with complaint of low back pain. Patient states it's mostly seems to be on the right side. Patient was cementing previously and was diagnosed with more of a piriformis syndrome. Patient did have x-rays lumbar spine showing moderate osteoarthritic changes as well as some mild scoliosis. Patient elected try some home exercises, icing regimen, and was to do some medial massage therapies. Patient states  No improvement, more pain in the buttocks, mild radiation.  No numbness mild weakness.  Still waking her up as well.      Past Medical History:  Diagnosis Date  . Depression   . Dysmenorrhea   . Gallbladder disease   . Headache(784.0)   . Hypertension   . Hypothyroidism   . Obesity   . Ovarian cyst    Past Surgical History:  Procedure Laterality Date  . ablation    . BREAST CYST EXCISION Right 06/24/2016   Procedure: EXCISION RIGHT BREAST CYST;  Surgeon: Coralie Keens, MD;  Location: [facility];  Service: General;  Laterality: Right;  . CESAREAN SECTION  2002, 2009  . CHOLECYSTECTOMY  2005  . NASAL SINUS SURGERY    . THYROID SURGERY  2012   Social History   Social History  . Marital status: Married    Spouse name: N/A  . Number of children: 2  . Years of education: N/A   Occupational History  . [employer]   Social History Main Topics  . Smoking status: Former Smoker    Packs/day: 0.30    Years: 20.00    Types: Cigarettes  . Smokeless tobacco: Never Used  . Alcohol use No  . Drug use: No  . Sexual activity: Not Currently   Other Topics Concern  . None   Social History Narrative  . None   Allergies  Allergen Reactions  . Spironolactone     Hair  loss  . Sulfonamide Derivatives Hives   Family History  Problem Relation Age of Onset  . Alcohol abuse Paternal Uncle        x2  . Arthritis Father   . Hypertension Father   . Cancer Other   . Heart disease Mother        CHF, A Fib  . Hypertension Mother   . Cervical cancer Mother   . Leukemia Mother   . Colon cancer Maternal Aunt   . Alcohol abuse Maternal Uncle        x2  . Heart disease Maternal Grandfather   . Pancreatic cancer Maternal Aunt   . Lung cancer Maternal Uncle     Past medical history, social, surgical and family history all reviewed in electronic medical record.  No pertanent information unless stated regarding to the chief complaint.   Review of Systems: No headache, visual changes, nausea, vomiting, diarrhea, constipation, dizziness, abdominal pain, skin rash, fevers, chills, night sweats, weight loss, swollen lymph nodes, body aches, joint swelling, muscle aches, chest pain, shortness of breath, mood changes.  + muscle aches.   Objective  Blood pressure 120/78, pulse 77, height 5\' 2"  (1.575 m), weight 211 lb (95.7 kg), last menstrual period 09/21/2016, SpO2 97 %.   Systems examined below  as of 09/21/16 General: NAD A&O x3 mood, affect normal  HEENT: Pupils equal, extraocular movements intact no nystagmus Respiratory: not short of breath at rest or with speaking Cardiovascular: No lower extremity edema, non tender Skin: Warm dry intact with no signs of infection or rash on extremities or on axial skeleton. Abdomen: Soft nontender, no masses Neuro: Cranial nerves  intact, neurovascularly intact in all extremities with 2+ DTRs and 2+ pulses. Lymph: No lymphadenopathy appreciated today  Gait normal with good balance and coordination.  MSK: Non tender with full range of motion and good stability and symmetric strength and tone of shoulders, elbows, wrist,  knee and ankles bilaterally.   Back Exam:  Inspection: Unremarkable  Motion: Flexion 45 deg, Extension  25 deg, Side Bending to 35 deg bilaterally,  Rotation to 25 deg bilaterally  SLR laying: Negative  XSLR laying: Negative  Palpable tenderness: Tender to palpation in the piriformis right . FABER: Positive right. Sensory change: Gross sensation intact to all lumbar and sacral dermatomes.  Reflexes: 2+ at both patellar tendons, 2+ at achilles tendons, Babinski's downgoing.  Strength at foot  Plantar-flexion: 5/5 Dorsi-flexion: 5/5 Eversion: 5/5 Inversion: 5/5  Leg strength  Quad: 5/5 Hamstring: 5/5 Hip flexor: 5/5 Hip abductors: 5/5  Gait unremarkable.   Procedure: Real-time Ultrasound Guided Injection of right piriformis tendon sheath Device: GE Logiq Q7 Ultrasound guided injection is preferred based studies that show increased duration, increased effect, greater accuracy, decreased procedural pain, increased response rate, and decreased cost with ultrasound guided versus blind injection.  Verbal informed consent obtained.  Time-out conducted.  Noted no overlying erythema, induration, or other signs of local infection.  Skin prepped in a sterile fashion.  Local anesthesia: Topical Ethyl chloride.  With sterile technique and under real time ultrasound guidance:  21 g 2 inch needle with 1 mL of Kenalog 40 mg/dL and 1 mL of 0.5% Marcaine Completed without difficulty  Pain immediately resolved suggesting accurate placement of the medication.  Advised to call if fevers/chills, erythema, induration, drainage, or persistent bleeding.  Images permanently stored and available for review in the ultrasound unit.  Impression: Technically successful ultrasound guided injection.   Impression and Recommendations:     This case required medical decision making of moderate complexity.      Note: This dictation was prepared with Dragon dictation along with smaller phrase technology. Any transcriptional errors that result from this process are unintentional.

## 2016-09-21 NOTE — Assessment & Plan Note (Signed)
Patient was given an injection and tolerated the procedure well. Neer complete resolution of pain. We discussed icing regimen and home exercises. This will be diagnostic as well as potentially therapeutic. If worsening symptoms further imaging including MRI of the lumbar spine may be necessary. Patient come back again in 2-3 weeks.

## 2016-09-24 ENCOUNTER — Ambulatory Visit: Payer: Managed Care, Other (non HMO) | Admitting: Internal Medicine

## 2016-09-29 ENCOUNTER — Ambulatory Visit (INDEPENDENT_AMBULATORY_CARE_PROVIDER_SITE_OTHER): Payer: 59 | Admitting: Internal Medicine

## 2016-09-29 ENCOUNTER — Encounter: Payer: Self-pay | Admitting: Internal Medicine

## 2016-09-29 VITALS — BP 118/80 | HR 71 | Temp 97.8°F | Resp 16 | Ht 62.0 in | Wt 209.5 lb

## 2016-09-29 DIAGNOSIS — F325 Major depressive disorder, single episode, in full remission: Secondary | ICD-10-CM | POA: Diagnosis not present

## 2016-09-29 DIAGNOSIS — I10 Essential (primary) hypertension: Secondary | ICD-10-CM

## 2016-09-29 DIAGNOSIS — E890 Postprocedural hypothyroidism: Secondary | ICD-10-CM | POA: Diagnosis not present

## 2016-09-29 NOTE — Patient Instructions (Signed)
Hypothyroidism Hypothyroidism is a disorder of the thyroid. The thyroid is a large gland that is located in the lower front of the neck. The thyroid releases hormones that control how the body works. With hypothyroidism, the thyroid does not make enough of these hormones. What are the causes? Causes of hypothyroidism may include:  Viral infections.  Pregnancy.  Your own defense system (immune system) attacking your thyroid.  Certain medicines.  Birth defects.  Past radiation treatments to your head or neck.  Past treatment with radioactive iodine.  Past surgical removal of part or all of your thyroid.  Problems with the gland that is located in the center of your brain (pituitary).  What are the signs or symptoms? Signs and symptoms of hypothyroidism may include:  Feeling as though you have no energy (lethargy).  Inability to tolerate cold.  Weight gain that is not explained by a change in diet or exercise habits.  Dry skin.  Coarse hair.  Menstrual irregularity.  Slowing of thought processes.  Constipation.  Sadness or depression.  How is this diagnosed? Your health care provider may diagnose hypothyroidism with blood tests and ultrasound tests. How is this treated? Hypothyroidism is treated with medicine that replaces the hormones that your body does not make. After you begin treatment, it may take several weeks for symptoms to go away. Follow these instructions at home:  Take medicines only as directed by your health care provider.  If you start taking any new medicines, tell your health care provider.  Keep all follow-up visits as directed by your health care provider. This is important. As your condition improves, your dosage needs may change. You will need to have blood tests regularly so that your health care provider can watch your condition. Contact a health care provider if:  Your symptoms do not get better with treatment.  You are taking thyroid  replacement medicine and: ? You sweat excessively. ? You have tremors. ? You feel anxious. ? You lose weight rapidly. ? You cannot tolerate heat. ? You have emotional swings. ? You have diarrhea. ? You feel weak. Get help right away if:  You develop chest pain.  You develop an irregular heartbeat.  You develop a rapid heartbeat. This information is not intended to replace advice given to you by your health care provider. Make sure you discuss any questions you have with your health care provider. Document Released: 04/20/2005 Document Revised: 09/26/2015 Document Reviewed: 09/05/2013 Elsevier Interactive Patient Education  2017 Elsevier Inc.  

## 2016-09-29 NOTE — Progress Notes (Signed)
Subjective:  Patient ID: Holly Mcmillan, female    DOB: 1970/12/04  Age: 46 y.o. MRN: [id number]  CC: Hypertension; Hypothyroidism; and Depression   HPI Holly Mcmillan presents for f/up - Her mood has improved significantly since I last saw her. She wants to stay on the current doses of Wellbutrin and Cymbalta. Her energy level is much better and she is losing weight without taking an appetite suppressant. She feels well today and offers no complaints.  Outpatient Medications Prior to Visit  Medication Sig Dispense Refill  . buPROPion (WELLBUTRIN XL) 300 MG 24 hr tablet TAKE 1 TABLET EVERY DAY 90 tablet 1  . DUEXIS 800-26.6 MG TABS TAKE ONE TABLET BY MOUTH THREE TIMES DAILY AS NEEDED 90 tablet 2  . DULoxetine (CYMBALTA) 30 MG capsule Take 1 capsule (30 mg total) by mouth daily. 30 capsule 2  . gabapentin (NEURONTIN) 100 MG capsule Take 2 capsules (200 mg total) by mouth at bedtime. 60 capsule 3  . levothyroxine (SYNTHROID, LEVOTHROID) 200 MCG tablet Take 1 tablet (200 mcg total) by mouth daily before breakfast. 90 tablet 1  . valsartan-hydrochlorothiazide (DIOVAN-HCT) 160-12.5 MG tablet Take 1 tablet by mouth daily. 90 tablet 1  . phentermine 37.5 MG capsule Take 1 capsule (37.5 mg total) by mouth every morning. 30 capsule 1  . pregabalin (LYRICA) 75 MG capsule Take 1 capsule (75 mg total) by mouth daily. 90 capsule 1  . LO LOESTRIN FE 1 MG-10 MCG / 10 MCG tablet     . promethazine (PHENERGAN) 12.5 MG tablet Take 1 tablet (12.5 mg total) by mouth every 6 (six) hours as needed for nausea or vomiting. 10 tablet 0   No facility-administered medications prior to visit.     ROS Review of Systems  Constitutional: Negative for activity change, appetite change, fatigue and unexpected weight change.  HENT: Negative.   Eyes: Negative for visual disturbance.  Respiratory: Negative for cough, chest tightness, shortness of breath and wheezing.   Cardiovascular: Negative for chest pain, palpitations  and leg swelling.  Gastrointestinal: Negative for abdominal pain, constipation, diarrhea, nausea and vomiting.  Genitourinary: Negative.  Negative for difficulty urinating.  Musculoskeletal: Negative.   Skin: Negative.   Neurological: Negative.  Negative for dizziness and numbness.  Hematological: Negative.  Negative for adenopathy. Does not bruise/bleed easily.  Psychiatric/Behavioral: Negative.  Negative for behavioral problems, decreased concentration, dysphoric mood, self-injury and suicidal ideas. The patient is not nervous/anxious.     Objective:  BP 118/80 (BP Location: Left Arm, Patient Position: Sitting, Cuff Size: Large)   Pulse 71   Temp 97.8 F (36.6 C) (Oral)   Resp 16   Ht 5\' 2"  (1.575 m)   Wt 209 lb 8 oz (95 kg)   LMP 09/21/2016   SpO2 98%   BMI 38.32 kg/m   BP Readings from Last 3 Encounters:  09/29/16 118/80  09/21/16 120/78  08/20/16 124/80    Wt Readings from Last 3 Encounters:  09/29/16 209 lb 8 oz (95 kg)  09/21/16 211 lb (95.7 kg)  08/20/16 212 lb 9.6 oz (96.4 kg)    Physical Exam  Constitutional: She is oriented to person, place, and time. No distress.  HENT:  Mouth/Throat: Oropharynx is clear and moist. No oropharyngeal exudate.  Eyes: Conjunctivae are normal. Right eye exhibits no discharge. Left eye exhibits no discharge. No scleral icterus.  Neck: Normal range of motion. Neck supple. No JVD present. No tracheal deviation present. No thyromegaly present.  Cardiovascular: Normal rate, regular  rhythm, normal heart sounds and intact distal pulses.  Exam reveals no gallop and no friction rub.   No murmur heard. Pulmonary/Chest: Effort normal and breath sounds normal. No stridor. No respiratory distress. She has no wheezes. She has no rales. She exhibits no tenderness.  Abdominal: Soft. Bowel sounds are normal. She exhibits no distension and no mass. There is no tenderness. There is no rebound and no guarding.  Musculoskeletal: Normal range of motion.  She exhibits no edema or tenderness.  Lymphadenopathy:    She has no cervical adenopathy.  Neurological: She is alert and oriented to person, place, and time.  Skin: Skin is warm and dry. No rash noted. She is not diaphoretic. No erythema. No pallor.  Psychiatric: She has a normal mood and affect. Her behavior is normal. Judgment and thought content normal.  Vitals reviewed.   Lab Results  Component Value Date   WBC 16.0 (H) 07/16/2016   HGB 15.2 (H) 07/16/2016   HCT 44.9 07/16/2016   PLT 344.0 07/16/2016   GLUCOSE 92 07/16/2016   CHOL 256 (H) 03/16/2016   TRIG 151.0 (H) 03/16/2016   HDL 57.30 03/16/2016   LDLDIRECT 164.9 01/15/2014   LDLCALC 168 (H) 03/16/2016   ALT 23 03/16/2016   AST 16 03/16/2016   NA 138 07/16/2016   K 3.7 07/16/2016   CL 102 07/16/2016   CREATININE 0.91 07/16/2016   BUN 16 07/16/2016   CO2 25 07/16/2016   TSH 1.18 07/16/2016   HGBA1C 5.5 03/16/2016    [place] Limited Joint Space Structures Low Right  Result Date: 09/24/2016 Procedure: Real-time Ultrasound Guided Injection of right piriformis tendon sheath Device: GE Logiq Q7 Ultrasound guided injection is preferred based studies that show increased duration, increased effect, greater accuracy, decreased procedural pain, increased response rate, and decreased cost with ultrasound guided versus blind injection. Verbal informed consent obtained. Time-out conducted. Noted no overlying erythema, induration, or other signs of local infection. Skin prepped in a sterile fashion. Local anesthesia: Topical Ethyl chloride. With sterile technique and under real time ultrasound guidance:  21 g 2 inch needle with 1 mL of Kenalog 40 mg/dL and 1 mL of 0.5% Marcaine Completed without difficulty Pain immediately resolved suggesting accurate placement of the medication. Advised to call if fevers/chills, erythema, induration, drainage, or persistent bleeding. Images permanently stored and available for review in the ultrasound unit.  Impression: Technically successful ultrasound guided injection.  Dg Hip Unilat With Pelvis 2-3 Views Right  Result Date: 09/21/2016 CLINICAL DATA:  46 y/o  F; 2 months of intermittent right hip pain. EXAM: DG HIP (WITH OR WITHOUT PELVIS) 2-3V RIGHT COMPARISON:  None. FINDINGS: No acute fracture or dislocation identified. Minimal osteoarthrosis of the right hip with superolateral acetabular fibrocystic changes. Pelvic phleboliths. Soft tissues are otherwise unremarkable. IMPRESSION: Minimal right hip osteoarthrosis. No acute osseous abnormality identified. Electronically Signed   By: Kristine Garbe M.D.   On: 09/21/2016 15:01    Assessment & Plan:   Jovita was seen today for hypertension, hypothyroidism and depression.  Diagnoses and all orders for this visit:  Essential hypertension- her blood pressure is well-controlled  Postoperative hypothyroidism- she is doing well on the current dose of levothyroxine, I've asked her to return in 4-6 months for a TSH level. She will return sooner if she develops any symptoms.  Depression, major, in remission ([place])- she has achieved remission on the combination of Cymbalta and Wellbutrin. Will continue.   I have discontinued Ms. Maradiaga's pregabalin, promethazine, and phentermine. I am  also having her maintain her LO LOESTRIN FE, valsartan-hydrochlorothiazide, levothyroxine, DULoxetine, gabapentin, DUEXIS, and buPROPion.  No orders of the defined types were placed in this encounter.    Follow-up: Return in about 4 months (around 01/30/2017).  Scarlette Calico, MD

## 2016-10-12 ENCOUNTER — Ambulatory Visit: Payer: 59 | Admitting: Internal Medicine

## 2016-10-19 ENCOUNTER — Other Ambulatory Visit: Payer: Self-pay | Admitting: Internal Medicine

## 2016-10-19 DIAGNOSIS — I10 Essential (primary) hypertension: Secondary | ICD-10-CM

## 2016-10-19 DIAGNOSIS — E890 Postprocedural hypothyroidism: Secondary | ICD-10-CM

## 2016-10-19 DIAGNOSIS — F325 Major depressive disorder, single episode, in full remission: Secondary | ICD-10-CM

## 2016-11-11 ENCOUNTER — Encounter: Payer: Self-pay | Admitting: Family Medicine

## 2016-11-11 ENCOUNTER — Ambulatory Visit (INDEPENDENT_AMBULATORY_CARE_PROVIDER_SITE_OTHER): Payer: 59 | Admitting: Family Medicine

## 2016-11-11 VITALS — BP 122/82 | HR 80 | Ht 62.0 in | Wt 194.0 lb

## 2016-11-11 DIAGNOSIS — G5701 Lesion of sciatic nerve, right lower limb: Secondary | ICD-10-CM

## 2016-11-11 NOTE — Progress Notes (Signed)
[facility] [address], [place] [address] Phone: [phone] Subjective:    I'm seeing this patient by the request  of:  [name], MD   CC: Low back pain and hip pain f/u  [id number]  Holly Mcmillan is a 46 y.o. female coming in with complaint of low back pain. Patient was seen previously and had more of a piriformis syndrome. Patient did have x-rays of the lower spine that were independently visualized by me showing moderate osteophytic changes of the lumbar spine. Patient was given an injection in the piriformis muscle 6 weeks ago. Patient was to start increasing activity as tolerated. Patient states about 50% better. Patient states that still some very mild radiation the pain. Patient's recent groin pain has stopped her from walking a couple times. Seems to be worse though when she externally rotates the leg now. Patient did have x-rays of her hip as well and only showed some very mild osteophytic changes. Patient states that she does feel like she is improving but is finding difficult to get any more improvement at this time.     Past Medical History:  Diagnosis Date  . Depression   . Dysmenorrhea   . Gallbladder disease   . Headache(784.0)   . Hypertension   . Hypothyroidism   . Obesity   . Ovarian cyst    Past Surgical History:  Procedure Laterality Date  . ablation    . BREAST CYST EXCISION Right 06/24/2016   Procedure: EXCISION RIGHT BREAST CYST;  Surgeon: Coralie Keens, MD;  Location: [facility];  Service: General;  Laterality: Right;  . CESAREAN SECTION  2002, 2009  . CHOLECYSTECTOMY  2005  . NASAL SINUS SURGERY    . THYROID SURGERY  2012   Social History   Social History  . Marital status: Married    Spouse name: N/A  . Number of children: 2  . Years of education: N/A   Occupational History  . [employer]   Social History Main Topics  . Smoking status: Former Smoker   Packs/day: 0.30    Years: 20.00    Types: Cigarettes  . Smokeless tobacco: Never Used  . Alcohol use No  . Drug use: No  . Sexual activity: Not Currently   Other Topics Concern  . None   Social History Narrative  . None   Allergies  Allergen Reactions  . Spironolactone     Hair loss  . Sulfonamide Derivatives Hives   Family History  Problem Relation Age of Onset  . Alcohol abuse Paternal Uncle        x2  . Arthritis Father   . Hypertension Father   . Cancer Other   . Heart disease Mother        CHF, A Fib  . Hypertension Mother   . Cervical cancer Mother   . Leukemia Mother   . Colon cancer Maternal Aunt   . Alcohol abuse Maternal Uncle        x2  . Heart disease Maternal Grandfather   . Pancreatic cancer Maternal Aunt   . Lung cancer Maternal Uncle     Past medical history, social, surgical and family history all reviewed in electronic medical record.  No pertanent information unless stated regarding to the chief complaint.   Review of Systems: No headache, visual changes, nausea, vomiting, diarrhea, constipation, dizziness, abdominal pain, skin rash, fevers, chills, night sweats, weight loss, swollen lymph nodes,  body aches, joint swelling, muscle aches, chest pain, shortness of breath, mood changes.    Objective  Blood pressure 122/82, pulse 80, height 5\' 2"  (1.575 m), weight 194 lb (88 kg).   Systems examined below as of 11/11/16 General: NAD A&O x3 mood, affect normal  HEENT: Pupils equal, extraocular movements intact no nystagmus Respiratory: not short of breath at rest or with speaking Cardiovascular: No lower extremity edema, non tender Skin: Warm dry intact with no signs of infection or rash on extremities or on axial skeleton. Abdomen: Soft nontender, no masses Neuro: Cranial nerves  intact, neurovascularly intact in all extremities with 2+ DTRs and 2+ pulses. Lymph: No lymphadenopathy appreciated today  Gait normal with good balance and  coordination.  MSK: Non tender with full range of motion and good stability and symmetric strength and tone of shoulders, elbows, wrist,  knee hips and ankles bilaterally.   Back Exam:  Inspection: Unremarkable  Motion: Flexion 45 deg, Extension 25 deg, Side Bending to 35 deg bilaterally,  Rotation to 25 deg bilaterally  SLR laying: Negative tightness of the hamstring bilaterally XSLR laying: Negative  Palpable tenderness: Continued mild to moderate tenderness over the piriformis FABER: Positive right. Does have pain over the adductor. Sensory change: Gross sensation intact to all lumbar and sacral dermatomes.  Reflexes: 2+ at both patellar tendons, 2+ at achilles tendons, Babinski's downgoing.  Strength at foot  Plantar-flexion: 5/5 Dorsi-flexion: 5/5 Eversion: 5/5 Inversion: 5/5  Leg strength  Quad: 5/5 Hamstring: 5/5 Hip flexor: 5/5 Hip abductors: 5/5  Gait unremarkable.     Impression and Recommendations:     This case required medical decision making of moderate complexity.      Note: This dictation was prepared with Dragon dictation along with smaller phrase technology. Any transcriptional errors that result from this process are unintentional.

## 2016-11-11 NOTE — Patient Instructions (Signed)
Good to see you  Ice 20 minutes 2 times daily. Usually after activity and before bed. PT will be calling you for setting it up  See me again in 4-6 weeks We may need to consider PRP injection or MRI of the back  Keep doing everything else.

## 2016-11-11 NOTE — Assessment & Plan Note (Signed)
50% better since previous exam. Patient did respond fairly well to the injection. Discussed with patient to continue the home exercises but will be sent to actually formal physical therapy.Spent  25 minutes with patient face-to-face and had greater than 50% of counseling including as described in assessment and plan. This included other treatment options including different medications, PRP injection, or possible MRI of the lumbar spine to further evaluate for any type of radicular symptoms again be contribute in. Patient elected do the physical therapy and continue conservative therapy at this point with her following up again in 4-6 weeks.

## 2016-11-17 ENCOUNTER — Encounter: Payer: Self-pay | Admitting: Physical Therapy

## 2016-11-17 ENCOUNTER — Ambulatory Visit: Payer: 59 | Attending: Internal Medicine | Admitting: Physical Therapy

## 2016-11-17 DIAGNOSIS — M25551 Pain in right hip: Secondary | ICD-10-CM | POA: Diagnosis not present

## 2016-11-17 DIAGNOSIS — M25651 Stiffness of right hip, not elsewhere classified: Secondary | ICD-10-CM | POA: Insufficient documentation

## 2016-11-17 DIAGNOSIS — M62838 Other muscle spasm: Secondary | ICD-10-CM | POA: Diagnosis present

## 2016-11-18 ENCOUNTER — Encounter: Payer: Self-pay | Admitting: Physical Therapy

## 2016-11-18 ENCOUNTER — Telehealth: Payer: Self-pay | Admitting: Internal Medicine

## 2016-11-18 ENCOUNTER — Other Ambulatory Visit: Payer: Self-pay | Admitting: Internal Medicine

## 2016-11-18 DIAGNOSIS — I10 Essential (primary) hypertension: Secondary | ICD-10-CM

## 2016-11-18 MED ORDER — TELMISARTAN-HCTZ 40-12.5 MG PO TABS: 1.0000 | ORAL_TABLET | Freq: Every day | ORAL | 1 refills | Status: DC

## 2016-11-18 NOTE — Telephone Encounter (Signed)
Pts valsartan-hydrochlorothiazide has been recalled and she wanted to know what she needed to do.

## 2016-11-18 NOTE — Therapy (Signed)
[place], [place], [address] Phone: [phone]   Fax:  [phone]  Physical Therapy Evaluation  Patient Details  Name: Holly Mcmillan MRN: [id number] Date of Birth: 1970/11/18 Referring Provider: Dr Hulan Saas   Encounter Date: 11/17/2016      PT End of Session - 11/18/16 1354    Visit Number 1   Number of Visits 16   Date for PT Re-Evaluation 01/13/17   Authorization Type Aetna    PT Start Time 1634   PT Stop Time 1717   PT Time Calculation (min) 43 min   Activity Tolerance Patient tolerated treatment well   Behavior During Therapy [facility] for tasks assessed/performed      Past Medical History:  Diagnosis Date  . Depression   . Dysmenorrhea   . Gallbladder disease   . Headache(784.0)   . Hypertension   . Hypothyroidism   . Obesity   . Ovarian cyst     Past Surgical History:  Procedure Laterality Date  . ablation    . BREAST CYST EXCISION Right 06/24/2016   Procedure: EXCISION RIGHT BREAST CYST;  Surgeon: Coralie Keens, MD;  Location: [place];  Service: General;  Laterality: Right;  . CESAREAN SECTION  2002, 2009  . CHOLECYSTECTOMY  2005  . NASAL SINUS SURGERY    . THYROID SURGERY  2012    There were no vitals filed for this visit.       Subjective Assessment - 11/17/16 1630    Subjective Patient reports about 6 months ago she began having right hip pain. She has never had pain like this before. The pain is in her lateral hip from her glutreals into her greater trochanter area. She also has pain that goes down into her groin area. X-rays show mild hip OA and degenration of her lumbar spine.She has increased pain when walking on a tredmill but no increase in pain on the elliptical.  .    Limitations Standing;Walking   How long can you sit comfortably? Stiffens during the day    How long can you stand comfortably? < 30 min    How long can you walk comfortably?  limited distances before pain increases   Currently in Pain? Yes   Pain Score 3   Patients pain can reach an 8-9/10 when it hits her    Pain Location Hip   Pain Orientation Right;Lateral   Pain Descriptors / Indicators Aching   Pain Type Chronic pain   Pain Onset More than a month ago   Aggravating Factors  Walking, corssing the legs    Pain Relieving Factors Nothing,             [facility] PT Assessment - 11/18/16 0001      Assessment   Medical Diagnosis Right hip pain    Referring Provider Dr Hulan Saas    Onset Date/Surgical Date --  6 months prior    Hand Dominance Right   Next MD Visit 6 weeks    Prior Therapy No therapy      Precautions   Precautions None     Restrictions   Weight Bearing Restrictions No     Balance Screen   Has the patient fallen in the past 6 months No   Has the patient had a decrease in activity level because of a fear of falling?  No   Is the patient reluctant to leave their home because of a fear of falling?  No     Home Environment   Additional Comments 3 steps to get in and a second floor. she also has a thrid Chief Technology Officer. She feels the pain when she goes up and down the steps.      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 Stiff after inital standing    Leisure Going to the gym/ Exercises      Cognition   Overall Cognitive Status No family/caregiver present to determine baseline cognitive functioning   Attention Focused   Focused Attention Appears intact   Memory Appears intact   Awareness Appears intact   Problem Solving Appears intact     Observation/Other Assessments   Focus on Therapeutic Outcomes (FOTO)  49% limitation      Sensation   Additional Comments pain radiating into her medial thigh area      Posture/Postural Control   Posture Comments Rounded shoulders forward head; Sits with flexed trunk      ROM / Strength    AROM / PROM / Strength PROM;AROM;Strength     AROM   AROM Assessment Site Lumbar   Lumbar Flexion limited 50% with pain    Lumbar Extension limited 25%    Lumbar - Right Side Bend no limit    Lumbar - Left Side Bend no limit    Lumbar - Right Rotation no limit    Lumbar - Left Rotation no limit      PROM   PROM Assessment Site Lumbar;Hip   Right/Left Hip Right   Right Hip Internal Rotation  5  with pain    Lumbar Flexion --  with pain      Strength   Overall Strength Comments left hip 5/5    Strength Assessment Site Hip;Knee   Right/Left Hip Right   Right Hip Flexion 4/5   Right Hip Extension 4/5   Right Hip ABduction 4/5     Right Hip   Right Hip Flexion 107     Flexibility   Soft Tissue Assessment /Muscle Length yes   Hamstrings 30 degrees bilateral      Palpation   Palpation comment spamsing of the lgut medius and tenderness with deep palpation of the piriformis. Difficult to feel for pelvic rotations 2nd to soft tissue      Special Tests    Special Tests Hip Special Tests  SLR (-) bilateral    Hip Special Tests  Hip Scouring;Piriformis Test     Hip Scouring   Comments (+) on the right      Piriformis Test   Comments (+) on the right side      Ambulation/Gait   Gait Comments decreased hip flexion on the left             Objective measurements completed on examination: See above findings.                  PT Education - 11/18/16 1354    Education provided Yes   Education Details HEP, symptom mangement, use of Ice    Person(s) Educated Patient   Methods Demonstration;Tactile cues;Explanation;Verbal cues;Handout   Comprehension Verbalized understanding;Returned demonstration;Verbal cues required;Tactile cues required;Need further instruction          PT Short Term Goals - 11/18/16 1356      PT SHORT TERM GOAL #1   Title Patient will demsotrate full pain free hip flexion on the right.  Time 4   Period Weeks   Status New      PT SHORT TERM GOAL #2   Title Patient will increase passive hip internal rotation by 10 degrees    Time 4   Period Weeks     PT SHORT TERM GOAL #3   Title Patient will increase gross right hip strength to 5/5    Time 4   Period Weeks   Status New     PT SHORT TERM GOAL #4   Title Patient will be independent with inital HEP for stretching and strengthening    Time 4   Period Weeks   Status New           PT Long Term Goals - 11/18/16 1403      PT LONG TERM GOAL #1   Title Patient will ambualte 3000' without increased pain in order to perfrom IADL's such as shopping    Time 8   Period Weeks   Status New     PT LONG TERM GOAL #2   Title Patient will return to the gym and walk on the tredmill for 10 minutes without pain    Time 8   Period Weeks   Status New     PT LONG TERM GOAL #3   Title Patient will demsotrate a 36% limitation on FOTO    Time 8   Period Weeks   Status New                Plan - 11/18/16 1510    Clinical Impression Statement Patient is a 46 year old female who presents with lateral hip pain and pain into her groin area. Signs and symptoms are consitent with diagnosis of piriformis syndrome. She has pain wioth deep palpation of the pirfirformis muslce. She also has pain around the right greater trochanter. She has pain with passive right hip flexion and I with limitations in motion. She would benefti from skilled therapy to adress the above deficits. She may benefit from trigger point dry needling in order to adress long standing trigger points.    Clinical Presentation Evolving   Clinical Presentation due to: increasing pain as time is going on    Clinical Decision Making Low   Rehab Potential Good   PT Frequency 2x / week   PT Duration 8 weeks   PT Treatment/Interventions ADLs/Self Care Home Management;Cryotherapy;Electrical Stimulation;Iontophoresis 4mg /ml Dexamethasone;Stair training;Gait training;Traction;Ultrasound;Therapeutic  activities;Therapeutic exercise;Neuromuscular re-education;Passive range of motion;Manual techniques;Taping;Dry needling   PT Next Visit Plan consider dry needling to the piriformis and potentially adductors if trigger points noted. LAD to the right hip; add light hip flexor/ core strengthening; Pelvic tilt to engage core; Any advanced core strengthening she can tolerate.    PT Home Exercise Plan butterfly stretch, piriformis stretch; hiip ER stretch patient (had difficulty); Single knee to chest stretch in pain free range   Consulted and Agree with Plan of Care Patient      Patient will benefit from skilled therapeutic intervention in order to improve the following deficits and impairments:  Decreased activity tolerance, Pain, Decreased strength, Impaired flexibility  Visit Diagnosis: Pain in right hip - Plan: PT plan of care cert/re-cert  Stiffness of right hip, not elsewhere classified - Plan: PT plan of care cert/re-cert  Other muscle spasm - Plan: PT plan of care cert/re-cert     Problem List Patient Active Problem List   Diagnosis Date Noted  . Piriformis syndrome of right side 08/17/2016  . Routine general medical  examination at a health care facility 03/16/2016  . RLS (restless legs syndrome) 03/16/2016  . Hypertriglyceridemia, essential 04/04/2015  . Hyperlipidemia with target LDL less than 100 04/04/2015  . External hemorrhoid 08/02/2014  . Severe obesity (BMI >= 40) ([place]) 09/20/2013  . Hyperglycemia 09/30/2011  . Hypothyroidism 07/09/2010  . Allergic rhinitis due to other allergen 06/30/2010  . TOBACCO USE 11/28/2009  . Depression, major, in remission ([place]) 11/28/2009  . Essential hypertension 11/28/2009    Carney Living PT DPT  11/18/2016, 5:49 PM  [facility] [address] [place], [place], [address] Phone: [phone]   Fax:  [phone]  Name: AHNYLA MENDEL MRN: [id number] Date of Birth: 1971/01/20

## 2016-11-18 NOTE — Telephone Encounter (Signed)
Notified pt w/MD response.../lmb 

## 2016-11-18 NOTE — Telephone Encounter (Signed)
changed

## 2016-11-25 ENCOUNTER — Other Ambulatory Visit: Payer: Self-pay | Admitting: Internal Medicine

## 2016-11-25 DIAGNOSIS — F325 Major depressive disorder, single episode, in full remission: Secondary | ICD-10-CM

## 2016-12-08 ENCOUNTER — Encounter: Payer: 59 | Admitting: Physical Therapy

## 2016-12-15 ENCOUNTER — Ambulatory Visit: Payer: 59 | Attending: Internal Medicine | Admitting: Physical Therapy

## 2016-12-15 ENCOUNTER — Encounter: Payer: Self-pay | Admitting: Physical Therapy

## 2016-12-15 DIAGNOSIS — M25651 Stiffness of right hip, not elsewhere classified: Secondary | ICD-10-CM

## 2016-12-15 DIAGNOSIS — M25551 Pain in right hip: Secondary | ICD-10-CM | POA: Diagnosis not present

## 2016-12-15 DIAGNOSIS — M62838 Other muscle spasm: Secondary | ICD-10-CM | POA: Diagnosis present

## 2016-12-15 NOTE — Therapy (Signed)
[place], [place], [address] Phone: [phone]   Fax:  [phone]  Physical Therapy Treatment  Patient Details  Name: Holly Mcmillan MRN: [id number] Date of Birth: [birth year] Referring Provider: Dr Hulan Saas   Encounter Date: 12/15/2016      PT End of Session - 12/15/16 1637    Visit Number 2   Number of Visits 16   Date for PT Re-Evaluation 01/13/17   Authorization Type Aetna    PT Start Time 1637   PT Stop Time 1721   PT Time Calculation (min) 44 min   Activity Tolerance Patient tolerated treatment well   Behavior During Therapy [facility] for tasks assessed/performed      Past Medical History:  Diagnosis Date  . Depression   . Dysmenorrhea   . Gallbladder disease   . Headache(784.0)   . Hypertension   . Hypothyroidism   . Obesity   . Ovarian cyst     Past Surgical History:  Procedure Laterality Date  . ablation    . BREAST CYST EXCISION Right 06/24/2016   Procedure: EXCISION RIGHT BREAST CYST;  Surgeon: Coralie Keens, MD;  Location: [place];  Service: General;  Laterality: Right;  . CESAREAN SECTION  2002, 2009  . CHOLECYSTECTOMY  2005  . NASAL SINUS SURGERY    . THYROID SURGERY  2012    There were no vitals filed for this visit.      Subjective Assessment - 12/15/16 1637    Subjective Feels like it is getting better. Feels sore all of the time. Left gabapentin last week while on vacation and noted to have increase in pain down leg.    Currently in Pain? Yes   Pain Score 4    Pain Location Buttocks   Pain Orientation Right                         [facility] Adult PT Treatment/Exercise - 12/15/16 0001      Exercises   Exercises Knee/Hip     Knee/Hip Exercises: Stretches   Other Knee/Hip Stretches supine figure 4     Knee/Hip Exercises: Aerobic   Nustep L4 6 min     Knee/Hip Exercises: Prone   Other Prone Exercises prone abdominal  engagement & glut set     Modalities   Modalities Moist Heat     Moist Heat Therapy   Number Minutes Moist Heat 10 Minutes   Moist Heat Location Hip     Manual Therapy   Manual Therapy Soft tissue mobilization   Manual therapy comments skilled palpation and montinoring while TPDN   Soft tissue mobilization R piriformis & hip external rotators          Trigger Point Dry Needling - 12/15/16 1659    Consent Given? Yes   Education Handout Provided --  verbal education   Muscles Treated Lower Body Piriformis   Piriformis Response Twitch response elicited;Palpable increased muscle length              PT Education - 12/15/16 1727    Education provided Yes   Education Details TPDN & expected outcomes, regular moving & stretching, exercise form/rationale   Person(s) Educated Patient   Methods Explanation;Demonstration;Tactile cues;Verbal cues   Comprehension Verbalized understanding;Returned demonstration;Verbal cues required;Tactile cues required;Need further instruction          PT Short Term Goals - 11/18/16 1356      PT SHORT  TERM GOAL #1   Title Patient will demsotrate full pain free hip flexion on the right.    Time 4   Period Weeks   Status New     PT SHORT TERM GOAL #2   Title Patient will increase passive hip internal rotation by 10 degrees    Time 4   Period Weeks     PT SHORT TERM GOAL #3   Title Patient will increase gross right hip strength to 5/5    Time 4   Period Weeks   Status New     PT SHORT TERM GOAL #4   Title Patient will be independent with inital HEP for stretching and strengthening    Time 4   Period Weeks   Status New           PT Long Term Goals - 11/18/16 1403      PT LONG TERM GOAL #1   Title Patient will ambualte 3000' without increased pain in order to perfrom IADL's such as shopping    Time 8   Period Weeks   Status New     PT LONG TERM GOAL #2   Title Patient will return to the gym and walk on the tredmill for  10 minutes without pain    Time 8   Period Weeks   Status New     PT LONG TERM GOAL #3   Title Patient will demsotrate a 36% limitation on FOTO    Time 8   Period Weeks   Status New               Plan - 12/15/16 1728    Clinical Impression Statement Decrease in concordant pain following DN. Educated on soreness that she should expect v pain. Pt reported feeling good after treatment today.    PT Treatment/Interventions ADLs/Self Care Home Management;Cryotherapy;Electrical Stimulation;Iontophoresis 4mg /ml Dexamethasone;Stair training;Gait training;Traction;Ultrasound;Therapeutic activities;Therapeutic exercise;Neuromuscular re-education;Passive range of motion;Manual techniques;Taping;Dry needling   PT Next Visit Plan lumbo pelvic strengthening, how did DN feel?   PT Home Exercise Plan butterfly stretch, piriformis stretch; hiip ER stretch patient (had difficulty); Single knee to chest stretch in pain free range   Consulted and Agree with Plan of Care Patient      Patient will benefit from skilled therapeutic intervention in order to improve the following deficits and impairments:  Decreased activity tolerance, Pain, Decreased strength, Impaired flexibility  Visit Diagnosis: Pain in right hip  Stiffness of right hip, not elsewhere classified  Other muscle spasm     Problem List Patient Active Problem List   Diagnosis Date Noted  . Piriformis syndrome of right side 08/17/2016  . Routine general medical examination at a health care facility 03/16/2016  . RLS (restless legs syndrome) 03/16/2016  . Hypertriglyceridemia, essential 04/04/2015  . Hyperlipidemia with target LDL less than 100 04/04/2015  . External hemorrhoid 08/02/2014  . Severe obesity (BMI >= 40) ([place]) 09/20/2013  . Hyperglycemia 09/30/2011  . Hypothyroidism 07/09/2010  . Allergic rhinitis due to other allergen 06/30/2010  . TOBACCO USE 11/28/2009  . Depression, major, in remission ([place]) 11/28/2009  .  Essential hypertension 11/28/2009    Devynn Scheff C. Soliyana Mcchristian PT, DPT 12/15/16 5:30 PM   [place], [place], [address] Phone: [phone]   Fax:  [phone]  Name: ILEANA CHALUPA MRN: [id number] Date of Birth: 1970-09-10

## 2016-12-16 ENCOUNTER — Ambulatory Visit (INDEPENDENT_AMBULATORY_CARE_PROVIDER_SITE_OTHER): Payer: 59 | Admitting: Family Medicine

## 2016-12-16 ENCOUNTER — Encounter: Payer: Self-pay | Admitting: Family Medicine

## 2016-12-16 DIAGNOSIS — G5701 Lesion of sciatic nerve, right lower limb: Secondary | ICD-10-CM

## 2016-12-16 MED ORDER — GABAPENTIN 300 MG PO CAPS: 300.0000 mg | ORAL_CAPSULE | Freq: Every day | ORAL | 3 refills | Status: DC

## 2016-12-16 NOTE — Patient Instructions (Signed)
Good to see you  Holly Mcmillan is your friend.  Stay active Continue the PT and the acupuncture Gabapentin 300mg  at night See me again in 7-8 weeks

## 2016-12-16 NOTE — Progress Notes (Signed)
[name] [place], [place] [address] Phone: [phone] Subjective:    I'm seeing this patient by the request  of:  [name], MD   CC: Low back pain and hip pain f/u  [id number]  Holly Mcmillan is a 46 y.o. female coming in with complaint of low back pain. Patient was seen previously and had more of a piriformis syndrome. Patient did have x-rays of the lower spine that were independently visualized by me showing moderate osteophytic changes of the lumbar spine. Patient did indicate percent improvement with the piriformis injection and then started with formal physical therapy. Patient states still doing a little bit better. Patient states that she has not done physical therapy more than one time over the acupuncture. Feels that she hasn't worsened at all. Patient denies any weakness. States that sometimes has some irritable shooting down the leg.     Past Medical History:  Diagnosis Date  . Depression   . Dysmenorrhea   . Gallbladder disease   . Headache(784.0)   . Hypertension   . Hypothyroidism   . Obesity   . Ovarian cyst    Past Surgical History:  Procedure Laterality Date  . ablation    . BREAST CYST EXCISION Right 06/24/2016   Procedure: EXCISION RIGHT BREAST CYST;  Surgeon: Coralie Keens, MD;  Location: [facility];  Service: General;  Laterality: Right;  . CESAREAN SECTION  2002, 2009  . CHOLECYSTECTOMY  2005  . NASAL SINUS SURGERY    . THYROID SURGERY  2012   Social History   Social History  . Marital status: Married    Spouse name: N/A  . Number of children: 2  . Years of education: N/A   Occupational History  . [employer]   Social History Main Topics  . Smoking status: Former Smoker    Packs/day: 0.30    Years: 20.00    Types: Cigarettes  . Smokeless tobacco: Never Used  . Alcohol use No  . Drug use: No  . Sexual activity: Not Currently   Other Topics Concern    . None   Social History Narrative  . None   Allergies  Allergen Reactions  . Spironolactone     Hair loss  . Sulfonamide Derivatives Hives   Family History  Problem Relation Age of Onset  . Alcohol abuse Paternal Uncle        x2  . Arthritis Father   . Hypertension Father   . Cancer Other   . Heart disease Mother        CHF, A Fib  . Hypertension Mother   . Cervical cancer Mother   . Leukemia Mother   . Colon cancer Maternal Aunt   . Alcohol abuse Maternal Uncle        x2  . Heart disease Maternal Grandfather   . Pancreatic cancer Maternal Aunt   . Lung cancer Maternal Uncle     Past medical history, social, surgical and family history all reviewed in electronic medical record.  No pertanent information unless stated regarding to the chief complaint.   Review of Systems: No headache, visual changes, nausea, vomiting, diarrhea, constipation, dizziness, abdominal pain, skin rash, fevers, chills, night sweats, weight loss, swollen lymph nodes, body aches, joint swelling, muscle aches, chest pain, shortness of breath, mood changes.     Objective  Blood pressure 118/82, pulse 70, height 5\' 2"  (1.575 m), weight 194 lb (88 kg).  Systems examined below as of 12/16/16 General: NAD A&O x3 mood, affect normal  HEENT: Pupils equal, extraocular movements intact no nystagmus Respiratory: not short of breath at rest or with speaking Cardiovascular: No lower extremity edema, non tender Skin: Warm dry intact with no signs of infection or rash on extremities or on axial skeleton. Abdomen: Soft nontender, no masses Neuro: Cranial nerves  intact, neurovascularly intact in all extremities with 2+ DTRs and 2+ pulses. Lymph: No lymphadenopathy appreciated today  Gait normal with good balance and coordination.  MSK: Non tender with full range of motion and good stability and symmetric strength and tone of shoulders, elbows, wrist,  knee hips and ankles bilaterally.   Back Exam:   Inspection: Unremarkable  Motion: Flexion 45 deg, Extension 25 deg, Side Bending to 40 deg bilaterally,  Rotation to 45 deg bilaterally  SLR laying: Negative  XSLR laying: Negative  Palpable tenderness: Tenderness to palpation of the paraspinal musculature lumbar spine L5-S1 on the right side as well as the piriformis. FABER: Positive on the right side with significant tightness compared to the contralateral side. Sensory change: Gross sensation intact to all lumbar and sacral dermatomes.  Reflexes: 2+ at both patellar tendons, 2+ at achilles tendons, Babinski's downgoing.  Strength at foot  4+ out of 5 strength in the right side compared to the contralateral side      Impression and Recommendations:     This case required medical decision making of moderate complexity.      Note: This dictation was prepared with Dragon dictation along with smaller phrase technology. Any transcriptional errors that result from this process are unintentional.

## 2016-12-16 NOTE — Assessment & Plan Note (Signed)
Likely more of the piriformis syndrome. Discuss once again about the PRP. Can repeat the steroid injection as well as needed. Patient also being more likely having a lumbar radiculopathy and possible MRI will be necessary with patient being a candidate for epidural steroid injections. Increase gabapentin to 300 mg. Patient was to continue conservative therapy and will follow-up with me again in 7-8 weeks

## 2016-12-22 ENCOUNTER — Ambulatory Visit: Payer: 59 | Admitting: Physical Therapy

## 2016-12-22 ENCOUNTER — Encounter: Payer: Self-pay | Admitting: Physical Therapy

## 2016-12-22 DIAGNOSIS — M62838 Other muscle spasm: Secondary | ICD-10-CM

## 2016-12-22 DIAGNOSIS — M25651 Stiffness of right hip, not elsewhere classified: Secondary | ICD-10-CM

## 2016-12-22 DIAGNOSIS — M25551 Pain in right hip: Secondary | ICD-10-CM

## 2016-12-22 NOTE — Therapy (Signed)
[place], [place], [address] Phone: [phone]   Fax:  [phone]  Physical Therapy Treatment  Patient Details  Name: Holly Mcmillan MRN: [id number] Date of Birth: July 07, 1970 Referring Provider: Dr Hulan Saas   Encounter Date: 12/22/2016      PT End of Session - 12/22/16 1636    Visit Number 3   Number of Visits 16   Date for PT Re-Evaluation 01/13/17   Authorization Type Aetna    PT Start Time 1636   PT Stop Time 1714   PT Time Calculation (min) 38 min   Activity Tolerance Patient tolerated treatment well;Patient limited by pain   Behavior During Therapy [facility] for tasks assessed/performed      Past Medical History:  Diagnosis Date  . Depression   . Dysmenorrhea   . Gallbladder disease   . Headache(784.0)   . Hypertension   . Hypothyroidism   . Obesity   . Ovarian cyst     Past Surgical History:  Procedure Laterality Date  . ablation    . BREAST CYST EXCISION Right 06/24/2016   Procedure: EXCISION RIGHT BREAST CYST;  Surgeon: Coralie Keens, MD;  Location: [place];  Service: General;  Laterality: Right;  . CESAREAN SECTION  2002, 2009  . CHOLECYSTECTOMY  2005  . NASAL SINUS SURGERY    . THYROID SURGERY  2012    There were no vitals filed for this visit.      Subjective Assessment - 12/22/16 1637    Subjective Feels like her hip hurts more, it is sore but not really different. Felt okay day after treatment, just sore, Thursday had to go up steps which was painful. Feels like it has gotten better but it hurts worse.    Currently in Pain? Yes   Pain Score 4    Pain Location Buttocks   Pain Orientation Right   Pain Descriptors / Indicators Sore   Aggravating Factors  stairs                         [facility] Adult PT Treatment/Exercise - 12/22/16 0001      Knee/Hip Exercises: Stretches   Active Hamstring Stretch 5 reps   Piriformis Stretch  Limitations pull across figure 4     Knee/Hip Exercises: Supine   Single Leg Bridge 15 reps;Both   Other Supine Knee/Hip Exercises reformer: foot work and bridging     Knee/Hip Exercises: Sidelying   Hip ABduction 20 reps;Both     Modalities   Modalities Ultrasound     Ultrasound   Ultrasound Location R ischial tuberosity   Ultrasound Parameters 5 min constant 1.0 w/cm2   Ultrasound Goals Pain                PT Education - 12/22/16 1720    Education provided Yes   Education Details ultrasound, ischial tuberosity pain, avoding irritants, POC   Person(s) Educated Patient   Methods Explanation;Demonstration;Tactile cues;Verbal cues;Handout   Comprehension Verbalized understanding;Returned demonstration;Verbal cues required;Tactile cues required;Need further instruction          PT Short Term Goals - 11/18/16 1356      PT SHORT TERM GOAL #1   Title Patient will demsotrate full pain free hip flexion on the right.    Time 4   Period Weeks   Status New     PT SHORT TERM GOAL #2   Title Patient will increase passive hip  internal rotation by 10 degrees    Time 4   Period Weeks     PT SHORT TERM GOAL #3   Title Patient will increase gross right hip strength to 5/5    Time 4   Period Weeks   Status New     PT SHORT TERM GOAL #4   Title Patient will be independent with inital HEP for stretching and strengthening    Time 4   Period Weeks   Status New           PT Long Term Goals - 11/18/16 1403      PT LONG TERM GOAL #1   Title Patient will ambualte 3000' without increased pain in order to perfrom IADL's such as shopping    Time 8   Period Weeks   Status New     PT LONG TERM GOAL #2   Title Patient will return to the gym and walk on the tredmill for 10 minutes without pain    Time 8   Period Weeks   Status New     PT LONG TERM GOAL #3   Title Patient will demsotrate a 36% limitation on FOTO    Time 8   Period Weeks   Status New                Plan - 12/22/16 1659    Clinical Impression Statement Concordant pain with palpation to ischial tuberosity today rather than muscle belly indicating irritatoin at attachment site. Pain most notable while descending stairs. Educated on how we will strenthen musculature around pain but we will have to be aware due to attachment sites on the area of pain. I asked her to avoid irritants such as stairs for a little bit and stand at work to get pressure off of ischial tuberosity. Poor body awareness with exercises and attempting to isolate muscle groups.    PT Treatment/Interventions ADLs/Self Care Home Management;Cryotherapy;Electrical Stimulation;Iontophoresis 4mg /ml Dexamethasone;Stair training;Gait training;Traction;Ultrasound;Therapeutic activities;Therapeutic exercise;Neuromuscular re-education;Passive range of motion;Manual techniques;Taping;Dry needling   PT Next Visit Plan check ischial tuberosity, strengthen hip abductors   PT Home Exercise Plan butterfly stretch, piriformis stretch; hiip ER stretch patient (had difficulty); Single knee to chest stretch in pain free range; single leg bridge to tolerance   Consulted and Agree with Plan of Care Patient      Patient will benefit from skilled therapeutic intervention in order to improve the following deficits and impairments:  Decreased activity tolerance, Pain, Decreased strength, Impaired flexibility  Visit Diagnosis: Pain in right hip  Stiffness of right hip, not elsewhere classified  Other muscle spasm     Problem List Patient Active Problem List   Diagnosis Date Noted  . Piriformis syndrome of right side 08/17/2016  . Routine general medical examination at a health care facility 03/16/2016  . RLS (restless legs syndrome) 03/16/2016  . Hypertriglyceridemia, essential 04/04/2015  . Hyperlipidemia with target LDL less than 100 04/04/2015  . External hemorrhoid 08/02/2014  . Severe obesity (BMI >= 40) ([place])  09/20/2013  . Hyperglycemia 09/30/2011  . Hypothyroidism 07/09/2010  . Allergic rhinitis due to other allergen 06/30/2010  . TOBACCO USE 11/28/2009  . Depression, major, in remission ([place]) 11/28/2009  . Essential hypertension 11/28/2009    Odarius Dines C. Jennefer Kopp PT, DPT 12/22/16 5:21 PM   [facility] [address] [place], [place], [address] Phone: [phone]   Fax:  [phone]  Name: Holly Mcmillan MRN: [id number] Date of Birth: 1970/07/24

## 2016-12-23 ENCOUNTER — Other Ambulatory Visit: Payer: Self-pay | Admitting: Internal Medicine

## 2016-12-23 ENCOUNTER — Ambulatory Visit: Payer: 59 | Admitting: Internal Medicine

## 2016-12-23 DIAGNOSIS — M545 Low back pain, unspecified: Secondary | ICD-10-CM

## 2016-12-25 ENCOUNTER — Ambulatory Visit (INDEPENDENT_AMBULATORY_CARE_PROVIDER_SITE_OTHER): Payer: 59 | Admitting: Internal Medicine

## 2016-12-25 ENCOUNTER — Encounter: Payer: Self-pay | Admitting: Internal Medicine

## 2016-12-25 VITALS — BP 118/74 | HR 74 | Temp 98.7°F | Resp 16 | Ht 62.0 in | Wt 194.0 lb

## 2016-12-25 DIAGNOSIS — R197 Diarrhea, unspecified: Secondary | ICD-10-CM | POA: Diagnosis not present

## 2016-12-25 DIAGNOSIS — I10 Essential (primary) hypertension: Secondary | ICD-10-CM

## 2016-12-25 DIAGNOSIS — R739 Hyperglycemia, unspecified: Secondary | ICD-10-CM | POA: Diagnosis not present

## 2016-12-25 MED ORDER — CIPROFLOXACIN HCL 500 MG PO TABS: 500.0000 mg | ORAL_TABLET | Freq: Two times a day (BID) | ORAL | 0 refills | Status: AC

## 2016-12-25 NOTE — Progress Notes (Signed)
Subjective:    Patient ID: Holly Mcmillan, female    DOB: 26-Oct-1970, 46 y.o.   MRN: [id number]  HPI  Here with acute onset 2 wks multiple episodes of watery diarrhea with occasional very mild nausea, but no HA, myalgias, fever, chills, abd pain, or blood. Just back from [facility].  No other travel, pets or other sick contacts.  No unusual or bad foods.  Denies worsening reflux, dysphagia.  Pt denies chest pain, increased sob or doe, wheezing, orthopnea, PND, increased LE swelling, palpitations, dizziness or syncope.   Pt denies polydipsia, polyuria  Denies urinary symptoms such as dysuria, urgency, flank pain, hematuria or n/v, fever, chills, though she has had some increased urinary frequency she thinks due to increase po fluids she has been drinking more of to avoid dehydration.  Has not tried OTC immodium.   Pt denies polydipsia, polyuria Past Medical History:  Diagnosis Date  . Depression   . Dysmenorrhea   . Gallbladder disease   . Headache(784.0)   . Hypertension   . Hypothyroidism   . Obesity   . Ovarian cyst    Past Surgical History:  Procedure Laterality Date  . ablation    . BREAST CYST EXCISION Right 06/24/2016   Procedure: EXCISION RIGHT BREAST CYST;  Surgeon: Coralie Keens, MD;  Location: [facility];  Service: General;  Laterality: Right;  . CESAREAN SECTION  2002, 2009  . CHOLECYSTECTOMY  2005  . NASAL SINUS SURGERY    . THYROID SURGERY  2012    reports that she has quit smoking. Her smoking use included Cigarettes. She has a 6.00 pack-year smoking history. She has never used smokeless tobacco. She reports that she does not drink alcohol or use drugs. family history includes Alcohol abuse in her maternal uncle and paternal uncle; Arthritis in her father; Cancer in her other; Cervical cancer in her mother; Colon cancer in her maternal aunt; Heart disease in her maternal grandfather and mother; Hypertension in her father and mother; Leukemia in her mother;  Lung cancer in her maternal uncle; Pancreatic cancer in her maternal aunt. Allergies  Allergen Reactions  . Spironolactone     Hair loss  . Sulfonamide Derivatives Hives   Current Outpatient Prescriptions on File Prior to Visit  Medication Sig Dispense Refill  . buPROPion (WELLBUTRIN XL) 300 MG 24 hr tablet TAKE 1 TABLET EVERY DAY 90 tablet 1  . DUEXIS 800-26.6 MG TABS TAKE ONE TABLET BY MOUTH THREE TIMES DAILY AS NEEDED 90 tablet 2  . DULoxetine (CYMBALTA) 30 MG capsule TAKE 1 CAPSULE (30 MG TOTAL) BY MOUTH DAILY. 90 capsule 1  . gabapentin (NEURONTIN) 300 MG capsule Take 1 capsule (300 mg total) by mouth at bedtime. 30 capsule 3  . levothyroxine (SYNTHROID, LEVOTHROID) 200 MCG tablet TAKE 1 TABLET (200 MCG TOTAL) BY MOUTH DAILY BEFORE BREAKFAST. 90 tablet 1  . LO LOESTRIN FE 1 MG-10 MCG / 10 MCG tablet     . telmisartan-hydrochlorothiazide (MICARDIS HCT) 40-12.5 MG tablet Take 1 tablet by mouth daily. 90 tablet 1   No current facility-administered medications on file prior to visit.    Review of Systems  Constitutional: Negative for other unusual diaphoresis or sweats HENT: Negative for ear discharge or swelling Eyes: Negative for other worsening visual disturbances Respiratory: Negative for stridor or other swelling  Gastrointestinal: Negative for worsening distension or other blood Genitourinary: Negative for retention or other urinary change Musculoskeletal: Negative for other MSK pain or swelling Skin: Negative for  color change or other new lesions Neurological: Negative for worsening tremors and other numbness  Psychiatric/Behavioral: Negative for worsening agitation or other fatigue All other system neg per pt    Objective:   Physical Exam BP 118/74 (BP Location: Left Arm, Patient Position: Sitting, Cuff Size: Large)   Pulse 74   Temp 98.7 F (37.1 C) (Oral)   Resp 16   Ht 5\' 2"  (1.575 m)   Wt 194 lb (88 kg)   SpO2 95%   BMI 35.48 kg/m  VS noted,  Constitutional:  Pt appears in NAD HENT: Head: NCAT.  Right Ear: External ear normal.  Left Ear: External ear normal.  Eyes: . Pupils are equal, round, and reactive to light. Conjunctivae and EOM are normal Nose: without d/c or deformity Neck: Neck supple. Gross normal ROM Cardiovascular: Normal rate and regular rhythm.   Pulmonary/Chest: Effort normal and breath sounds without rales or wheezing.  Abd:  Soft, NT, ND, + BS, no organomegaly - benign Neurological: Pt is alert. At baseline orientation, motor grossly intact Skin: Skin is warm. No rashes, other new lesions, no LE edema Psychiatric: Pt behavior is normal without agitation  No other exam findings Lab Results  Component Value Date   WBC 16.0 (H) 07/16/2016   HGB 15.2 (H) 07/16/2016   HCT 44.9 07/16/2016   PLT 344.0 07/16/2016   GLUCOSE 92 07/16/2016   CHOL 256 (H) 03/16/2016   TRIG 151.0 (H) 03/16/2016   HDL 57.30 03/16/2016   LDLDIRECT 164.9 01/15/2014   LDLCALC 168 (H) 03/16/2016   ALT 23 03/16/2016   AST 16 03/16/2016   NA 138 07/16/2016   K 3.7 07/16/2016   CL 102 07/16/2016   CREATININE 0.91 07/16/2016   BUN 16 07/16/2016   CO2 25 07/16/2016   TSH 1.18 07/16/2016   HGBA1C 5.5 03/16/2016       Assessment & Plan:

## 2016-12-25 NOTE — Patient Instructions (Signed)
Please take all new medication as prescribed - the antibiotic  Please continue all other medications as before, and refills have been done if requested.  Please have the pharmacy call with any other refills you may need.  Please keep your appointments with your specialists as you may have planned   

## 2016-12-25 NOTE — Assessment & Plan Note (Signed)
stable overall by history and exam, recent data reviewed with pt, and pt to continue medical treatment as before,  to f/u any worsening symptoms or concerns BP Readings from Last 3 Encounters:  12/25/16 118/74  12/16/16 118/82  11/11/16 122/82

## 2016-12-25 NOTE — Assessment & Plan Note (Signed)
Lab Results  Component Value Date   HGBA1C 5.5 03/16/2016  stable overall by history and exam, recent data reviewed with pt, and pt to continue medical treatment as before,  to f/u any worsening symptoms or concerns

## 2016-12-25 NOTE — Assessment & Plan Note (Signed)
Pt simply seems to have freq episodes watery diarrhea very unusual for her with very mild nausea, without other toxic s/s.  Exam is benign.  Doubt IBS or other psychiatric overlay.  Doubt c diff.  Just back from [facility] .  Since pt is non toxic and no red flags, will tx for travelers diarrhea with cipro, but consider stool studies for any worsening s/s.  Consider GI referral if not improved.  Likely OK for immodium otc prn but prefer she not to gauge if the antibx is effective

## 2016-12-27 ENCOUNTER — Other Ambulatory Visit: Payer: Self-pay | Admitting: Family Medicine

## 2016-12-29 ENCOUNTER — Ambulatory Visit: Payer: 59 | Admitting: Physical Therapy

## 2016-12-29 ENCOUNTER — Encounter: Payer: Self-pay | Admitting: Physical Therapy

## 2016-12-29 DIAGNOSIS — M25651 Stiffness of right hip, not elsewhere classified: Secondary | ICD-10-CM

## 2016-12-29 DIAGNOSIS — M25551 Pain in right hip: Secondary | ICD-10-CM

## 2016-12-29 DIAGNOSIS — M62838 Other muscle spasm: Secondary | ICD-10-CM

## 2016-12-29 NOTE — Therapy (Signed)
[place], [place], [address] Phone: [phone]   Fax:  [phone]  Physical Therapy Treatment  Patient Details  Name: Holly Mcmillan MRN: [id number] Date of Birth: [birth year] Referring Provider: Dr Hulan Saas   Encounter Date: 12/29/2016      PT End of Session - 12/29/16 1635    Visit Number 4   Number of Visits 16   Date for PT Re-Evaluation 01/13/17   Authorization Type Aetna    PT Start Time 1636   PT Stop Time 7035   PT Time Calculation (min) 39 min   Activity Tolerance Patient tolerated treatment well   Behavior During Therapy [facility] for tasks assessed/performed      Past Medical History:  Diagnosis Date  . Depression   . Dysmenorrhea   . Gallbladder disease   . Headache(784.0)   . Hypertension   . Hypothyroidism   . Obesity   . Ovarian cyst     Past Surgical History:  Procedure Laterality Date  . ablation    . BREAST CYST EXCISION Right 06/24/2016   Procedure: EXCISION RIGHT BREAST CYST;  Surgeon: Coralie Keens, MD;  Location: [place];  Service: General;  Laterality: Right;  . CESAREAN SECTION  2002, 2009  . CHOLECYSTECTOMY  2005  . NASAL SINUS SURGERY    . THYROID SURGERY  2012    There were no vitals filed for this visit.      Subjective Assessment - 12/29/16 1636    Subjective Reports feeling a little better. Same pain, less intensity.                          [place] Adult PT Treatment/Exercise - 12/29/16 0001      Knee/Hip Exercises: Stretches   Piriformis Stretch Limitations pull across figure 4   Gastroc Stretch 2 reps;30 seconds   Gastroc Stretch Limitations slant board     Knee/Hip Exercises: Aerobic   Nustep L5 5 min     Knee/Hip Exercises: Seated   Sit to [employer] 10 reps     Knee/Hip Exercises: Supine   Bridges with [employer] 15 reps   Single Leg Bridge Both;15 reps   Other Supine Knee/Hip Exercises table top holds      Knee/Hip Exercises: Sidelying   Hip ABduction 20 reps;Both   Hip ABduction Limitations with pressback     Ultrasound   Ultrasound Location R ischial tuberosity   Ultrasound Parameters 8 min constant 1.2w/cm2   Ultrasound Goals Pain                PT Education - 12/29/16 1718    Education provided Yes   Education Details exercise form/rationale, HEP   Person(s) Educated Patient   Methods Explanation;Demonstration;Tactile cues;Verbal cues;Handout   Comprehension Verbalized understanding;Returned demonstration;Verbal cues required;Tactile cues required;Need further instruction          PT Short Term Goals - 11/18/16 1356      PT SHORT TERM GOAL #1   Title Patient will demsotrate full pain free hip flexion on the right.    Time 4   Period Weeks   Status New     PT SHORT TERM GOAL #2   Title Patient will increase passive hip internal rotation by 10 degrees    Time 4   Period Weeks     PT SHORT TERM GOAL #3   Title Patient will increase gross right hip strength to 5/5  Time 4   Period Weeks   Status New     PT SHORT TERM GOAL #4   Title Patient will be independent with inital HEP for stretching and strengthening    Time 4   Period Weeks   Status New           PT Long Term Goals - 11/18/16 1403      PT LONG TERM GOAL #1   Title Patient will ambualte 3000' without increased pain in order to perfrom IADL's such as shopping    Time 8   Period Weeks   Status New     PT LONG TERM GOAL #2   Title Patient will return to the gym and walk on the tredmill for 10 minutes without pain    Time 8   Period Weeks   Status New     PT LONG TERM GOAL #3   Title Patient will demsotrate a 36% limitation on FOTO    Time 8   Period Weeks   Status New               Plan - 12/29/16 1718    Clinical Impression Statement Tolerated exercises without increase in pain until she was asked to climb stairs and experienced ischial tuberosity pain. Verbalized  resolution of pain following [place] today.    PT Treatment/Interventions ADLs/Self Care Home Management;Cryotherapy;Electrical Stimulation;Iontophoresis 4mg /ml Dexamethasone;Stair training;Gait training;Traction;Ultrasound;Therapeutic activities;Therapeutic exercise;Neuromuscular re-education;Passive range of motion;Manual techniques;Taping;Dry needling   PT Next Visit Plan core+gluts   PT Home Exercise Plan butterfly stretch, piriformis stretch; hiip ER stretch patient (had difficulty); Single knee to chest stretch in pain free range; single leg bridge to tolerance; bridge with ball squeeze, table top holds;    Consulted and Agree with Plan of Care Patient      Patient will benefit from skilled therapeutic intervention in order to improve the following deficits and impairments:  Decreased activity tolerance, Pain, Decreased strength, Impaired flexibility  Visit Diagnosis: Pain in right hip  Stiffness of right hip, not elsewhere classified  Other muscle spasm     Problem List Patient Active Problem List   Diagnosis Date Noted  . Watery diarrhea 12/25/2016  . Piriformis syndrome of right side 08/17/2016  . Routine general medical examination at a health care facility 03/16/2016  . RLS (restless legs syndrome) 03/16/2016  . Hypertriglyceridemia, essential 04/04/2015  . Hyperlipidemia with target LDL less than 100 04/04/2015  . External hemorrhoid 08/02/2014  . Severe obesity (BMI >= 40) ([place]) 09/20/2013  . Hyperglycemia 09/30/2011  . Hypothyroidism 07/09/2010  . Allergic rhinitis due to other allergen 06/30/2010  . TOBACCO USE 11/28/2009  . Depression, major, in remission ([place]) 11/28/2009  . Essential hypertension 11/28/2009    Tatem Holsonback C. Tyrell Seifer PT, DPT 12/29/16 5:21 PM   [facility] [address] [place], [place], [address] Phone: [phone]   Fax:  [phone]  Name: DUA MEHLER MRN: [id number] Date of Birth:  04-25-1971

## 2017-01-05 ENCOUNTER — Encounter: Payer: Self-pay | Admitting: Physical Therapy

## 2017-01-05 ENCOUNTER — Ambulatory Visit: Payer: 59 | Attending: Internal Medicine | Admitting: Physical Therapy

## 2017-01-05 DIAGNOSIS — M25651 Stiffness of right hip, not elsewhere classified: Secondary | ICD-10-CM

## 2017-01-05 DIAGNOSIS — M25551 Pain in right hip: Secondary | ICD-10-CM

## 2017-01-05 DIAGNOSIS — M62838 Other muscle spasm: Secondary | ICD-10-CM

## 2017-01-05 NOTE — Therapy (Signed)
[place], [place], [address] Phone: [phone]   Fax:  [phone]  Physical Therapy Treatment  Patient Details  Name: Holly Mcmillan MRN: [id number] Date of Birth: 01-Mar-1971 Referring Provider: Dr Hulan Saas   Encounter Date: 01/05/2017      PT End of Session - 01/05/17 1635    Visit Number 5   Number of Visits 16   Date for PT Re-Evaluation 01/13/17   Authorization Type Aetna    PT Start Time 1635   PT Stop Time 1719   PT Time Calculation (min) 44 min   Activity Tolerance Patient tolerated treatment well   Behavior During Therapy [facility] for tasks assessed/performed      Past Medical History:  Diagnosis Date  . Depression   . Dysmenorrhea   . Gallbladder disease   . Headache(784.0)   . Hypertension   . Hypothyroidism   . Obesity   . Ovarian cyst     Past Surgical History:  Procedure Laterality Date  . ablation    . BREAST CYST EXCISION Right 06/24/2016   Procedure: EXCISION RIGHT BREAST CYST;  Surgeon: Coralie Keens, MD;  Location: [place];  Service: General;  Laterality: Right;  . CESAREAN SECTION  2002, 2009  . CHOLECYSTECTOMY  2005  . NASAL SINUS SURGERY    . THYROID SURGERY  2012    There were no vitals filed for this visit.      Subjective Assessment - 01/05/17 1635    Subjective Feeling much better. I know it's there but its almost gone. Was able to do stairs and sit at work.    Currently in Pain? No/denies                         [facility] Adult PT Treatment/Exercise - 01/05/17 0001      Knee/Hip Exercises: Stretches   Passive Hamstring Stretch Both;2 reps;30 seconds   Passive Hamstring Stretch Limitations seated edge of chair   Piriformis Stretch Limitations pull across figure 4- supine & seated     Knee/Hip Exercises: Aerobic   Nustep L5 5 min     Knee/Hip Exercises: Standing   Other Standing Knee Exercises sit<>stand with cues   Other Standing Knee Exercises eccentric step down     Knee/Hip Exercises: Supine   Bridges Limitations + physioball reach OH; +tower press   Other Supine Knee/Hip Exercises abdominal isometrics with plyo ball   Other Supine Knee/Hip Exercises marching with tower press; arm circles tower press     Ultrasound   Ultrasound Location R ischial tuberosity   Ultrasound Parameters 8 min constant 1.2w/cm2   Ultrasound Goals Pain                  PT Short Term Goals - 11/18/16 1356      PT SHORT TERM GOAL #1   Title Patient will demsotrate full pain free hip flexion on the right.    Time 4   Period Weeks   Status New     PT SHORT TERM GOAL #2   Title Patient will increase passive hip internal rotation by 10 degrees    Time 4   Period Weeks     PT SHORT TERM GOAL #3   Title Patient will increase gross right hip strength to 5/5    Time 4   Period Weeks   Status New     PT SHORT TERM GOAL #4  Title Patient will be independent with inital HEP for stretching and strengthening    Time 4   Period Weeks   Status New           PT Long Term Goals - 11/18/16 1403      PT LONG TERM GOAL #1   Title Patient will ambualte 3000' without increased pain in order to perfrom IADL's such as shopping    Time 8   Period Weeks   Status New     PT LONG TERM GOAL #2   Title Patient will return to the gym and walk on the tredmill for 10 minutes without pain    Time 8   Period Weeks   Status New     PT LONG TERM GOAL #3   Title Patient will demsotrate a 36% limitation on FOTO    Time 8   Period Weeks   Status New               Plan - 01/05/17 1720    Clinical Impression Statement Pt with tendency to drop L hip to reach with stairs, worked on keeping level pelvis through motion. exercises for core strength today. Mild increase in discomfort and requested [place] today. Reported feeling great at the end of the session.    PT Treatment/Interventions ADLs/Self Care Home  Management;Cryotherapy;Electrical Stimulation;Iontophoresis 4mg /ml Dexamethasone;Stair training;Gait training;Traction;Ultrasound;Therapeutic activities;Therapeutic exercise;Neuromuscular re-education;Passive range of motion;Manual techniques;Taping;Dry needling   PT Next Visit Plan d/c if pain resolved or ERO   PT Home Exercise Plan butterfly stretch, piriformis stretch; hiip ER stretch patient (had difficulty); Single knee to chest stretch in pain free range; single leg bridge to tolerance; bridge with ball squeeze, table top holds;    Consulted and Agree with Plan of Care Patient      Patient will benefit from skilled therapeutic intervention in order to improve the following deficits and impairments:  Decreased activity tolerance, Pain, Decreased strength, Impaired flexibility  Visit Diagnosis: Pain in right hip  Stiffness of right hip, not elsewhere classified  Other muscle spasm     Problem List Patient Active Problem List   Diagnosis Date Noted  . Watery diarrhea 12/25/2016  . Piriformis syndrome of right side 08/17/2016  . Routine general medical examination at a health care facility 03/16/2016  . RLS (restless legs syndrome) 03/16/2016  . Hypertriglyceridemia, essential 04/04/2015  . Hyperlipidemia with target LDL less than 100 04/04/2015  . External hemorrhoid 08/02/2014  . Severe obesity (BMI >= 40) ([place]) 09/20/2013  . Hyperglycemia 09/30/2011  . Hypothyroidism 07/09/2010  . Allergic rhinitis due to other allergen 06/30/2010  . TOBACCO USE 11/28/2009  . Depression, major, in remission ([place]) 11/28/2009  . Essential hypertension 11/28/2009    Akshita Italiano C. Sanjuanita Condrey PT, DPT 01/05/17 5:22 PM   [place], [place], [address] Phone: [phone]   Fax:  [phone]  Name: Holly Mcmillan MRN: [id number] Date of Birth: 1970/06/11

## 2017-01-19 ENCOUNTER — Encounter: Payer: Self-pay | Admitting: Physical Therapy

## 2017-01-19 ENCOUNTER — Ambulatory Visit: Payer: 59 | Admitting: Physical Therapy

## 2017-01-19 DIAGNOSIS — M62838 Other muscle spasm: Secondary | ICD-10-CM

## 2017-01-19 DIAGNOSIS — M25551 Pain in right hip: Secondary | ICD-10-CM | POA: Diagnosis not present

## 2017-01-19 DIAGNOSIS — M25651 Stiffness of right hip, not elsewhere classified: Secondary | ICD-10-CM

## 2017-01-19 NOTE — Therapy (Signed)
[place], [place], [address] Phone: [phone]   Fax:  [phone]  Physical Therapy Treatment/Discharge Summary  Patient Details  Name: Holly Mcmillan MRN: [id number] Date of Birth: 1970/07/06 Referring Provider: Hulan Saas, MD  Encounter Date: 01/19/2017      PT End of Session - 01/19/17 1102    Visit Number 6   Number of Visits 16   Date for PT Re-Evaluation 01/13/17   Authorization Type [employer]    PT Start Time 1103   PT Stop Time 1134   PT Time Calculation (min) 31 min   Activity Tolerance Patient tolerated treatment well   Behavior During Therapy [facility] for tasks assessed/performed      Past Medical History:  Diagnosis Date  . Depression   . Dysmenorrhea   . Gallbladder disease   . Headache(784.0)   . Hypertension   . Hypothyroidism   . Obesity   . Ovarian cyst     Past Surgical History:  Procedure Laterality Date  . ablation    . BREAST CYST EXCISION Right 06/24/2016   Procedure: EXCISION RIGHT BREAST CYST;  Surgeon: Coralie Keens, MD;  Location: [place];  Service: General;  Laterality: Right;  . CESAREAN SECTION  2002, 2009  . CHOLECYSTECTOMY  2005  . NASAL SINUS SURGERY    . THYROID SURGERY  2012    There were no vitals filed for this visit.      Subjective Assessment - 01/19/17 1103    Subjective Reports feeling great. still there but much better. Still does a little pop if I turn wrong.    Currently in Pain? No/denies            [facility] PT Assessment - 01/19/17 0001      Assessment   Medical Diagnosis Right hip pain    Referring Provider Hulan Saas, MD     Observation/Other Assessments   Focus on Therapeutic Outcomes (FOTO)  31% limitation     AROM   Lumbar Flexion --  WFL, no pain   Lumbar Extension --  WFL, no pain     PROM   Right Hip Internal Rotation  20  discomfort at end range     Strength   Right Hip Flexion 4+/5   Right  Hip Extension 5/5   Right Hip ABduction 5/5     Right Hip   Right Hip Flexion --  full and pain free                     [facility] Adult PT Treatment/Exercise - 01/19/17 0001      Knee/Hip Exercises: Stretches   Piriformis Stretch Limitations pull across figure 4- supine    Other Knee/Hip Stretches single knee to chest   Other Knee/Hip Stretches butterfly     Knee/Hip Exercises: Machines for Strengthening   Cybex Knee Extension 30lb   Cybex Knee Flexion 35lb   Cybex Leg Press 65lb     Knee/Hip Exercises: Supine   Bridges Limitations bridges with ball squeeze & single leg   Other Supine Knee/Hip Exercises table top holds                PT Education - 01/19/17 1143    Education provided Yes   Education Details use of gym equipment, importance of continued stretches/exercises, review of goals & objective measures   Person(s) Educated Patient   Methods Demonstration;Explanation;Tactile cues;Verbal cues   Comprehension Verbalized understanding;Returned demonstration;Verbal cues  required;Tactile cues required;Need further instruction          PT Short Term Goals - 01/19/17 1106      PT SHORT TERM GOAL #1   Title Patient will demsotrate full pain free hip flexion on the right.    Status Achieved     PT SHORT TERM GOAL #2   Title Patient will increase passive hip internal rotation by 10 degrees    Baseline 20   Status Achieved     PT SHORT TERM GOAL #3   Title Patient will increase gross right hip strength to 5/5    Baseline see flowsheet   Status Partially Met     PT SHORT TERM GOAL #4   Title Patient will be independent with inital HEP for stretching and strengthening    Status Achieved           PT Long Term Goals - 01/19/17 1113      PT LONG TERM GOAL #1   Title Patient will ambualte 3000' without increased pain in order to perfrom IADL's such as shopping    Baseline not limited   Status Achieved     PT LONG TERM GOAL #2   Title  Patient will return to the gym and walk on the tredmill for 10 minutes without pain    Baseline has returned to gym   Status Achieved     PT [place] #3   Title Patient will demsotrate a 36% limitation on FOTO    Baseline 31% limitation at eval   Status Achieved               Plan - 01/19/17 1147    Clinical Impression Statement Pt has made significant improvements since beginning PT. She has met all of her goals and is d/c to independent program at this time. Pt verbalized comfort and understanding of long term HEP and was encouraged to contact us with any questions.    PT Treatment/Interventions ADLs/Self Care Home Management;Cryotherapy;Electrical Stimulation;Iontophoresis '4mg'$ /ml Dexamethasone;Stair training;Gait training;Traction;Ultrasound;Therapeutic activities;Therapeutic exercise;Neuromuscular re-education;Passive range of motion;Manual techniques;Taping;Dry needling      Patient will benefit from skilled therapeutic intervention in order to improve the following deficits and impairments:  Decreased activity tolerance, Pain, Decreased strength, Impaired flexibility  Visit Diagnosis: Pain in right hip - Plan: PT plan of care cert/re-cert  Stiffness of right hip, not elsewhere classified - Plan: PT plan of care cert/re-cert  Other muscle spasm - Plan: PT plan of care cert/re-cert     Problem List Patient Active Problem List   Diagnosis Date Noted  . Watery diarrhea 12/25/2016  . Piriformis syndrome of right side 08/17/2016  . Routine general medical examination at a health care facility 03/16/2016  . RLS (restless legs syndrome) 03/16/2016  . Hypertriglyceridemia, essential 04/04/2015  . Hyperlipidemia with target LDL less than 100 04/04/2015  . External hemorrhoid 08/02/2014  . Severe obesity (BMI >= 40) ([place]) 09/20/2013  . Hyperglycemia 09/30/2011  . Hypothyroidism 07/09/2010  . Allergic rhinitis due to other allergen 06/30/2010  . TOBACCO USE  11/28/2009  . Depression, major, in remission ([place]) 11/28/2009  . Essential hypertension 11/28/2009   PHYSICAL THERAPY DISCHARGE SUMMARY  Visits from Start of Care: 6  Current functional level related to goals / functional outcomes: See above   Remaining deficits: See above   Education / Equipment: Anatomy of condition, POC, HEP, exercise form/rationale  Plan: Patient agrees to discharge.  Patient goals were met. Patient is being discharged due to meeting  the stated rehab goals.  ?????     Lee Kuang C. Oval Moralez PT, DPT 01/19/17 11:55 AM   [place] [facility] [address] [place], [place], [address] Phone: [phone]   Fax:  [phone]  Name: Holly Mcmillan MRN: [id number] Date of Birth: [birth year]

## 2017-02-03 ENCOUNTER — Ambulatory Visit: Payer: 59 | Admitting: Family Medicine

## 2017-02-24 ENCOUNTER — Ambulatory Visit: Payer: 59 | Admitting: Family Medicine

## 2017-03-03 ENCOUNTER — Ambulatory Visit (INDEPENDENT_AMBULATORY_CARE_PROVIDER_SITE_OTHER): Payer: 59 | Admitting: Family Medicine

## 2017-03-03 ENCOUNTER — Encounter: Payer: Self-pay | Admitting: Family Medicine

## 2017-03-03 DIAGNOSIS — G5701 Lesion of sciatic nerve, right lower limb: Secondary | ICD-10-CM

## 2017-03-03 NOTE — Patient Instructions (Signed)
Good to see you  You are doing amazing.  Continue the gabapentin  See me when you need me

## 2017-03-03 NOTE — Progress Notes (Signed)
[name] [place], [place] [address] Phone: [phone] Subjective:     CC: Back pain follow-up   [id number]  Holly Mcmillan is a 46 y.o. female coming in with complaint of a piriformis syndrome. This is been long-standing. Has been doing formal physical therapy. Patient has had workup showing mild osteophytic changes of the hip as well as moderate osteophytic changes of the back. Patient has been doing formal physical therapy and did respond very well to a piriformis injection 5 months ago. States that she is 95% better. Nothing that is stopping her from any activity at this time.      Past Medical History:  Diagnosis Date  . Depression   . Dysmenorrhea   . Gallbladder disease   . Headache(784.0)   . Hypertension   . Hypothyroidism   . Obesity   . Ovarian cyst    Past Surgical History:  Procedure Laterality Date  . ablation    . BREAST CYST EXCISION Right 06/24/2016   Procedure: EXCISION RIGHT BREAST CYST;  Surgeon: Coralie Keens, MD;  Location: [place];  Service: General;  Laterality: Right;  . CESAREAN SECTION  2002, 2009  . CHOLECYSTECTOMY  2005  . NASAL SINUS SURGERY    . THYROID SURGERY  2012   Social History   Social History  . Marital status: Married    Spouse name: N/A  . Number of children: 2  . Years of education: N/A   Occupational History  . [employer]   Social History Main Topics  . Smoking status: Former Smoker    Packs/day: 0.30    Years: 20.00    Types: Cigarettes  . Smokeless tobacco: Never Used  . Alcohol use No  . Drug use: No  . Sexual activity: Not Currently   Other Topics Concern  . None   Social History Narrative  . None   Allergies  Allergen Reactions  . Spironolactone     Hair loss  . Sulfonamide Derivatives Hives   Family History  Problem Relation Age of Onset  . Alcohol abuse Paternal Uncle        x2  . Arthritis Father   .  Hypertension Father   . Cancer Other   . Heart disease Mother        CHF, A Fib  . Hypertension Mother   . Cervical cancer Mother   . Leukemia Mother   . Colon cancer Maternal Aunt   . Alcohol abuse Maternal Uncle        x2  . Heart disease Maternal Grandfather   . Pancreatic cancer Maternal Aunt   . Lung cancer Maternal Uncle      Past medical history, social, surgical and family history all reviewed in electronic medical record.  No pertanent information unless stated regarding to the chief complaint.   Review of Systems:Review of systems updated and as accurate as of 03/03/17  No headache, visual changes, nausea, vomiting, diarrhea, constipation, dizziness, abdominal pain, skin rash, fevers, chills, night sweats, weight loss, swollen lymph nodes, body aches, joint swelling, chest pain, shortness of breath, mood changes. Positive muscle aches  Objective  Blood pressure 130/90, pulse 69, height 5\' 2"  (1.575 m), weight 190 lb (86.2 kg), SpO2 98 %. Systems examined below as of 03/03/17   General: No apparent distress alert and oriented x3 mood and affect normal, dressed appropriately.  HEENT: Pupils equal, extraocular movements intact  Respiratory: Patient's speak in  full sentences and does not appear short of breath  Cardiovascular: No lower extremity edema, non tender, no erythema  Skin: Warm dry intact with no signs of infection or rash on extremities or on axial skeleton.  Abdomen: Soft nontender  Neuro: Cranial nerves II through XII are intact, neurovascularly intact in all extremities with 2+ DTRs and 2+ pulses.  Lymph: No lymphadenopathy of posterior or anterior cervical chain or axillae bilaterally.  Gait normal with good balance and coordination.  MSK:  Non tender with full range of motion and good stability and symmetric strength and tone of shoulders, elbows, wrist, hip, knee and ankles bilaterally.  Back Exam:  Inspection: Mild loss of lordosis Motion: Flexion 45 deg,  Extension 25 deg, Side Bending to 35 deg bilaterally,  Rotation to 35 deg bilaterally  SLR laying: Negative  XSLR laying: Negative  Palpable tenderness: Mild tightness still on the paraspinal musculature of the lumbar spine right side only. FABER: Positive right. Sensory change: Gross sensation intact to all lumbar and sacral dermatomes.  Reflexes: 2+ at both patellar tendons, 2+ at achilles tendons, Babinski's downgoing.  Strength at foot  Plantar-flexion: 5/5 Dorsi-flexion: 5/5 Eversion: 5/5 Inversion: 5/5  Leg strength  Quad: 5/5 Hamstring: 5/5 Hip flexor: 5/5 Hip abductors: 5/5  Gait unremarkable.     Impression and Recommendations:     This case required medical decision making of moderate complexity.      Note: This dictation was prepared with Dragon dictation along with smaller phrase technology. Any transcriptional errors that result from this process are unintentional.

## 2017-03-03 NOTE — Assessment & Plan Note (Signed)
Much better overall. Still potential for a lumbar radiculopathy with the intermittent difficulty with sitting a long amount of time. Patient will (adjustable standing does. We discussed icing regimen and home exercises. We discussed objective is to do a which was to avoid. As long as patient does relatively well patient follow-up as needed.

## 2017-04-12 ENCOUNTER — Other Ambulatory Visit: Payer: Self-pay | Admitting: Internal Medicine

## 2017-04-12 DIAGNOSIS — E890 Postprocedural hypothyroidism: Secondary | ICD-10-CM

## 2017-04-12 DIAGNOSIS — F325 Major depressive disorder, single episode, in full remission: Secondary | ICD-10-CM

## 2017-05-20 ENCOUNTER — Other Ambulatory Visit: Payer: Self-pay | Admitting: Internal Medicine

## 2017-05-20 DIAGNOSIS — I10 Essential (primary) hypertension: Secondary | ICD-10-CM

## 2017-05-26 ENCOUNTER — Encounter: Payer: Self-pay | Admitting: Internal Medicine

## 2017-05-26 ENCOUNTER — Other Ambulatory Visit (INDEPENDENT_AMBULATORY_CARE_PROVIDER_SITE_OTHER): Payer: 59

## 2017-05-26 ENCOUNTER — Ambulatory Visit (INDEPENDENT_AMBULATORY_CARE_PROVIDER_SITE_OTHER): Payer: 59 | Admitting: Internal Medicine

## 2017-05-26 VITALS — BP 142/84 | HR 80 | Temp 98.1°F | Resp 16 | Ht 62.0 in | Wt 204.0 lb

## 2017-05-26 DIAGNOSIS — E781 Pure hyperglyceridemia: Secondary | ICD-10-CM

## 2017-05-26 DIAGNOSIS — L309 Dermatitis, unspecified: Secondary | ICD-10-CM | POA: Insufficient documentation

## 2017-05-26 DIAGNOSIS — E039 Hypothyroidism, unspecified: Secondary | ICD-10-CM

## 2017-05-26 DIAGNOSIS — B001 Herpesviral vesicular dermatitis: Secondary | ICD-10-CM | POA: Diagnosis not present

## 2017-05-26 DIAGNOSIS — E785 Hyperlipidemia, unspecified: Secondary | ICD-10-CM

## 2017-05-26 DIAGNOSIS — I10 Essential (primary) hypertension: Secondary | ICD-10-CM

## 2017-05-26 LAB — LIPID PANEL
CHOLESTEROL: 252 mg/dL — AB (ref 0–200)
HDL: 57.1 mg/dL (ref >39.00)
LDL Cholesterol: 157 mg/dL — ABNORMAL HIGH (ref 0–99)
NonHDL: 194.52
Total CHOL/HDL Ratio: 4
Triglycerides: 189 mg/dL — ABNORMAL HIGH (ref 0.0–149.0)
VLDL: 37.8 mg/dL (ref 0.0–40.0)

## 2017-05-26 LAB — COMPREHENSIVE METABOLIC PANEL
ALBUMIN: 4.3 g/dL (ref 3.5–5.2)
ALK PHOS: 65 U/L (ref 39–117)
ALT: 31 U/L (ref 0–35)
AST: 20 U/L (ref 0–37)
BILIRUBIN TOTAL: 0.4 mg/dL (ref 0.2–1.2)
BUN: 16 mg/dL (ref 6–23)
CO2: 25 mEq/L (ref 19–32)
CREATININE: 0.79 mg/dL (ref 0.40–1.20)
Calcium: 9.3 mg/dL (ref 8.4–10.5)
Chloride: 102 mEq/L (ref 96–112)
GFR: 82.58 mL/min (ref >60.00)
Glucose, Bld: 75 mg/dL (ref 70–99)
Potassium: 4.2 mEq/L (ref 3.5–5.1)
SODIUM: 140 meq/L (ref 135–145)
TOTAL PROTEIN: 7.7 g/dL (ref 6.0–8.3)

## 2017-05-26 LAB — CBC WITH DIFFERENTIAL/PLATELET
Basophils Absolute: 0.1 10*3/uL (ref 0.0–0.1)
Basophils Relative: 0.5 % (ref 0.0–3.0)
EOS ABS: 0.1 10*3/uL (ref 0.0–0.7)
Eosinophils Relative: 0.6 % (ref 0.0–5.0)
HCT: 43.1 % (ref 36.0–46.0)
HEMOGLOBIN: 14.4 g/dL (ref 12.0–15.0)
Lymphocytes Relative: 24.8 % (ref 12.0–46.0)
Lymphs Abs: 2.7 10*3/uL (ref 0.7–4.0)
MCHC: 33.4 g/dL (ref 30.0–36.0)
MCV: 94.5 fl (ref 78.0–100.0)
MONO ABS: 1 10*3/uL (ref 0.1–1.0)
Monocytes Relative: 9.2 % (ref 3.0–12.0)
NEUTROS PCT: 64.9 % (ref 43.0–77.0)
Neutro Abs: 7.1 10*3/uL (ref 1.4–7.7)
Platelets: 344 10*3/uL (ref 150.0–400.0)
RBC: 4.57 Mil/uL (ref 3.87–5.11)
RDW: 13.1 % (ref 11.5–15.5)
WBC: 10.9 10*3/uL — AB (ref 4.0–10.5)

## 2017-05-26 LAB — TSH: TSH: 0.13 u[IU]/mL — AB (ref 0.35–4.50)

## 2017-05-26 MED ORDER — EPICERAM EX EMUL: 1.0000 | Freq: Two times a day (BID) | CUTANEOUS | 0 refills | Status: DC

## 2017-05-26 MED ORDER — VALACYCLOVIR HCL 1 G PO TABS: 2000.0000 mg | ORAL_TABLET | Freq: Two times a day (BID) | ORAL | 2 refills | Status: DC

## 2017-05-26 NOTE — Progress Notes (Signed)
Subjective:  Patient ID: Holly Mcmillan, female    DOB: December 27, 1970  Age: 47 y.o. MRN: [id number]  CC: Hypertension; Hyperlipidemia; Hypothyroidism; and Rash   HPI Holly Mcmillan presents for f/up -  She complains of a several day history of a painful fever blister on her left upper lip.  She also complains of weight gain and is concerned that her thyroid dose may not be adequate.  She denies fatigue, constipation, or edema.  She tells me her blood pressure has been well controlled.  She also complains of intermittent scaling and itchy over her forehead, both sides for several years.  Outpatient Medications Prior to Visit  Medication Sig Dispense Refill  . buPROPion (WELLBUTRIN XL) 300 MG 24 hr tablet TAKE 1 TABLET EVERY DAY 90 tablet 1  . DUEXIS 800-26.6 MG TABS TAKE ONE TABLET BY MOUTH THREE TIMES DAILY AS NEEDED 90 tablet 2  . DULoxetine (CYMBALTA) 30 MG capsule TAKE 1 CAPSULE BY MOUTH EVERY DAY 90 capsule 0  . gabapentin (NEURONTIN) 300 MG capsule Take 1 capsule (300 mg total) by mouth at bedtime. 30 capsule 3  . telmisartan-hydrochlorothiazide (MICARDIS HCT) 40-12.5 MG tablet Take 1 tablet by mouth daily. 90 tablet 1  . levothyroxine (SYNTHROID, LEVOTHROID) 200 MCG tablet TAKE 1 TABLET (200 MCG TOTAL) BY MOUTH DAILY BEFORE BREAKFAST. 90 tablet 0  . gabapentin (NEURONTIN) 100 MG capsule TAKE 2 CAPSULES BY MOUTH EVERY DAY AT BEDTIME 60 capsule 0  . LO LOESTRIN FE 1 MG-10 MCG / 10 MCG tablet      No facility-administered medications prior to visit.     ROS Review of Systems  Constitutional: Positive for unexpected weight change. Negative for diaphoresis and fatigue.  HENT: Positive for mouth sores.   Eyes: Negative.  Negative for visual disturbance.  Respiratory: Negative for cough, chest tightness and shortness of breath.   Cardiovascular: Negative for chest pain, palpitations and leg swelling.  Gastrointestinal: Negative for abdominal pain, constipation, diarrhea, nausea and  vomiting.  Endocrine: Negative.  Negative for cold intolerance and heat intolerance.  Genitourinary: Negative.  Negative for difficulty urinating.  Musculoskeletal: Negative.  Negative for back pain and myalgias.  Skin: Positive for rash. Negative for color change.  Allergic/Immunologic: Negative.   Neurological: Negative.  Negative for dizziness, weakness and headaches.  Hematological: Negative for adenopathy. Does not bruise/bleed easily.  Psychiatric/Behavioral: Negative.     Objective:  BP (!) 142/84 (BP Location: Left Arm, Patient Position: Sitting, Cuff Size: Large)   Pulse 80   Temp 98.1 F (36.7 C) (Oral)   Resp 16   Ht 5\' 2"  (1.575 m)   Wt 204 lb (92.5 kg)   SpO2 96%   BMI 37.31 kg/m   BP Readings from Last 3 Encounters:  05/26/17 (!) 142/84  03/03/17 130/90  12/25/16 118/74    Wt Readings from Last 3 Encounters:  05/26/17 204 lb (92.5 kg)  03/03/17 190 lb (86.2 kg)  12/25/16 194 lb (88 kg)    Physical Exam  Constitutional: She is oriented to person, place, and time. No distress.  HENT:  Head:    Mouth/Throat: Oropharynx is clear and moist. No oropharyngeal exudate.  Eyes: Conjunctivae are normal. Left eye exhibits no discharge. No scleral icterus.  Neck: Normal range of motion. Neck supple. No JVD present. No thyromegaly present.  Cardiovascular: Normal rate, regular rhythm and normal heart sounds. Exam reveals no gallop.  No murmur heard. Pulmonary/Chest: Effort normal and breath sounds normal. No respiratory distress. She has  no wheezes. She has no rales.  Abdominal: Soft. Bowel sounds are normal. She exhibits no distension and no mass. There is no tenderness.  Musculoskeletal: Normal range of motion. She exhibits no edema, tenderness or deformity.  Lymphadenopathy:    She has no cervical adenopathy.  Neurological: She is alert and oriented to person, place, and time.  Skin: Skin is warm and dry. No rash noted. She is not diaphoretic. No erythema.    Vitals reviewed.   Lab Results  Component Value Date   WBC 10.9 (H) 05/26/2017   HGB 14.4 05/26/2017   HCT 43.1 05/26/2017   PLT 344.0 05/26/2017   GLUCOSE 75 05/26/2017   CHOL 252 (H) 05/26/2017   TRIG 189.0 (H) 05/26/2017   HDL 57.10 05/26/2017   LDLDIRECT 164.9 01/15/2014   LDLCALC 157 (H) 05/26/2017   ALT 31 05/26/2017   AST 20 05/26/2017   NA 140 05/26/2017   K 4.2 05/26/2017   CL 102 05/26/2017   CREATININE 0.79 05/26/2017   BUN 16 05/26/2017   CO2 25 05/26/2017   TSH 0.13 (L) 05/26/2017   HGBA1C 5.5 03/16/2016    [place] Limited Joint Space Structures Low Right  Result Date: 09/24/2016 Procedure: Real-time Ultrasound Guided Injection of right piriformis tendon sheath Device: GE Logiq Q7 Ultrasound guided injection is preferred based studies that show increased duration, increased effect, greater accuracy, decreased procedural pain, increased response rate, and decreased cost with ultrasound guided versus blind injection. Verbal informed consent obtained. Time-out conducted. Noted no overlying erythema, induration, or other signs of local infection. Skin prepped in a sterile fashion. Local anesthesia: Topical Ethyl chloride. With sterile technique and under real time ultrasound guidance:  21 g 2 inch needle with 1 mL of Kenalog 40 mg/dL and 1 mL of 0.5% Marcaine Completed without difficulty Pain immediately resolved suggesting accurate placement of the medication. Advised to call if fevers/chills, erythema, induration, drainage, or persistent bleeding. Images permanently stored and available for review in the ultrasound unit. Impression: Technically successful ultrasound guided injection.  Dg Hip Unilat With Pelvis 2-3 Views Right  Result Date: 09/21/2016 CLINICAL DATA:  47 y/o  F; 2 months of intermittent right hip pain. EXAM: DG HIP (WITH OR WITHOUT PELVIS) 2-3V RIGHT COMPARISON:  None. FINDINGS: No acute fracture or dislocation identified. Minimal osteoarthrosis of the right  hip with superolateral acetabular fibrocystic changes. Pelvic phleboliths. Soft tissues are otherwise unremarkable. IMPRESSION: Minimal right hip osteoarthrosis. No acute osseous abnormality identified. Electronically Signed   By: Kristine Garbe M.D.   On: 09/21/2016 15:01    Assessment & Plan:   Laren was seen today for hypertension, hyperlipidemia, hypothyroidism and rash.  Diagnoses and all orders for this visit:  Essential hypertension- Her blood pressure is adequately well controlled.  Electrolytes and renal function are normal. -     CBC with Differential/Platelet; Future -     Comprehensive metabolic panel; Future  Hyperlipidemia with target LDL less than 100- Her ASCVD risk score is not elevated so I do not recommend a statin for CV risk reduction. -     Comprehensive metabolic panel; Future -     Lipid panel; Future  Hypertriglyceridemia, essential- Her trigs are mildly elevated but medical therapy is not indicated.  I have asked her to improve her lifestyle modifications. -     Lipid panel; Future  Acquired hypothyroidism- Her TSH is mildly suppressed so I have decreased her dose of levothyroxine. -     TSH; Future -  levothyroxine (SYNTHROID, LEVOTHROID) 175 MCG tablet; Take 1 tablet (175 mcg total) by mouth daily before breakfast.  Fever blister -     valACYclovir (VALTREX) 1000 MG tablet; Take 2 tablets (2,000 mg total) by mouth 2 (two) times daily for 2 doses.  Eczema of face -     Dermatological Products, Misc. [facility]) lotion; Apply 1 Act topically 2 (two) times daily with a meal.   I have discontinued [name] S. Auten's LO LOESTRIN FE and levothyroxine. I am also having her start on valACYclovir, EPICERAM, and levothyroxine. Additionally, I am having her maintain her telmisartan-hydrochlorothiazide, buPROPion, gabapentin, DUEXIS, and DULoxetine.  Meds ordered this encounter  Medications  . valACYclovir (VALTREX) 1000 MG tablet    Sig: Take 2 tablets  (2,000 mg total) by mouth 2 (two) times daily for 2 doses.    Dispense:  4 tablet    Refill:  2  . Dermatological Products, Misc. [facility]) lotion    Sig: Apply 1 Act topically 2 (two) times daily with a meal.    Dispense:  90 g    Refill:  0  . levothyroxine (SYNTHROID, LEVOTHROID) 175 MCG tablet    Sig: Take 1 tablet (175 mcg total) by mouth daily before breakfast.    Dispense:  90 tablet    Refill:  0     Follow-up: Return in about 6 months (around 11/23/2017).  Scarlette Calico, MD

## 2017-05-26 NOTE — Patient Instructions (Signed)
Hypothyroidism Hypothyroidism is a disorder of the thyroid. The thyroid is a large gland that is located in the lower front of the neck. The thyroid releases hormones that control how the body works. With hypothyroidism, the thyroid does not make enough of these hormones. What are the causes? Causes of hypothyroidism may include:  Viral infections.  Pregnancy.  Your own defense system (immune system) attacking your thyroid.  Certain medicines.  Birth defects.  Past radiation treatments to your head or neck.  Past treatment with radioactive iodine.  Past surgical removal of part or all of your thyroid.  Problems with the gland that is located in the center of your brain (pituitary).  What are the signs or symptoms? Signs and symptoms of hypothyroidism may include:  Feeling as though you have no energy (lethargy).  Inability to tolerate cold.  Weight gain that is not explained by a change in diet or exercise habits.  Dry skin.  Coarse hair.  Menstrual irregularity.  Slowing of thought processes.  Constipation.  Sadness or depression.  How is this diagnosed? Your health care provider may diagnose hypothyroidism with blood tests and ultrasound tests. How is this treated? Hypothyroidism is treated with medicine that replaces the hormones that your body does not make. After you begin treatment, it may take several weeks for symptoms to go away. Follow these instructions at home:  Take medicines only as directed by your health care provider.  If you start taking any new medicines, tell your health care provider.  Keep all follow-up visits as directed by your health care provider. This is important. As your condition improves, your dosage needs may change. You will need to have blood tests regularly so that your health care provider can watch your condition. Contact a health care provider if:  Your symptoms do not get better with treatment.  You are taking thyroid  replacement medicine and: ? You sweat excessively. ? You have tremors. ? You feel anxious. ? You lose weight rapidly. ? You cannot tolerate heat. ? You have emotional swings. ? You have diarrhea. ? You feel weak. Get help right away if:  You develop chest pain.  You develop an irregular heartbeat.  You develop a rapid heartbeat. This information is not intended to replace advice given to you by your health care provider. Make sure you discuss any questions you have with your health care provider. Document Released: 04/20/2005 Document Revised: 09/26/2015 Document Reviewed: 09/05/2013 Elsevier Interactive Patient Education  2018 Elsevier Inc.  

## 2017-05-27 ENCOUNTER — Encounter: Payer: Self-pay | Admitting: Internal Medicine

## 2017-05-27 ENCOUNTER — Telehealth: Payer: Self-pay | Admitting: Internal Medicine

## 2017-05-27 DIAGNOSIS — F325 Major depressive disorder, single episode, in full remission: Secondary | ICD-10-CM

## 2017-05-27 DIAGNOSIS — I10 Essential (primary) hypertension: Secondary | ICD-10-CM

## 2017-05-27 MED ORDER — LEVOTHYROXINE SODIUM 175 MCG PO TABS: 175.0000 ug | ORAL_TABLET | Freq: Every day | ORAL | 0 refills | Status: DC

## 2017-05-27 MED ORDER — TELMISARTAN-HCTZ 40-12.5 MG PO TABS: 1.0000 | ORAL_TABLET | Freq: Every day | ORAL | 1 refills | Status: DC

## 2017-05-27 NOTE — Telephone Encounter (Signed)
Copied from [id number] [phone]. Topic: Quick Communication - Rx Refill/Question >> May 27, 2017  2:34 PM Percell Belt A wrote: Medication:telmisartan-hydrochlorothiazide (MICARDIS HCT) 40-12.5 MG tablet [[id number]]    Has the patient contacted their pharmacy?   (Agent: If no, request that the patient contact the pharmacy for the refill.)   Preferred Pharmacy (with phone number or street name): [facility]    Agent: Please be advised that RX refills may take up to 3 business days. We ask that you follow-up with your pharmacy.

## 2017-05-28 MED ORDER — BUPROPION HCL ER (XL) 300 MG PO TB24: 300.0000 mg | ORAL_TABLET | Freq: Every day | ORAL | 1 refills | Status: DC

## 2017-06-09 ENCOUNTER — Encounter: Payer: Self-pay | Admitting: Family Medicine

## 2017-06-09 ENCOUNTER — Ambulatory Visit (INDEPENDENT_AMBULATORY_CARE_PROVIDER_SITE_OTHER): Payer: 59 | Admitting: Family Medicine

## 2017-06-09 DIAGNOSIS — G5701 Lesion of sciatic nerve, right lower limb: Secondary | ICD-10-CM

## 2017-06-09 NOTE — Progress Notes (Signed)
[name] [address], [place] [address] Phone: [phone] Subjective:    I'm seeing this patient by the request  of:    CC:   GEX:[id number]  Holly Mcmillan is a 47 y.o. female coming in right hip pain. The pain came back 2 weeks ago and states that her pain was worse than it was before. She states that her leg goes numb daily and is popping a lot.   Onset-  Location Duration-  Character- Aggravating factors- Reliving factors-  Therapies tried-  Severity-     Past Medical History:  Diagnosis Date  . Depression   . Dysmenorrhea   . Gallbladder disease   . Headache(784.0)   . Hypertension   . Hypothyroidism   . Obesity   . Ovarian cyst    Past Surgical History:  Procedure Laterality Date  . ablation    . BREAST CYST EXCISION Right 06/24/2016   Procedure: EXCISION RIGHT BREAST CYST;  Surgeon: Coralie Keens, MD;  Location: [facility];  Service: General;  Laterality: Right;  . CESAREAN SECTION  2002, 2009  . CHOLECYSTECTOMY  2005  . NASAL SINUS SURGERY    . THYROID SURGERY  2012   Social History   Socioeconomic History  . Marital status: Married    Spouse name: Not on file  . Number of children: 2  . Years of education: Not on file  . Highest education level: Not on file  Social Needs  . Financial resource strain: Not on file  . Food insecurity - worry: Not on file  . Food insecurity - inability: Not on file  . Transportation needs - medical: Not on file  . Transportation needs - non-medical: Not on file  Occupational History  . Occupation: Art therapist: Civil engineer, contracting  Tobacco Use  . Smoking status: Former Smoker    Packs/day: 0.30    Years: 20.00    Pack years: 6.00    Types: Cigarettes  . Smokeless tobacco: Never Used  Substance and Sexual Activity  . Alcohol use: No  . Drug use: No  . Sexual activity: Not Currently  Other Topics Concern  . Not on file  Social History  Narrative  . Not on file   Allergies  Allergen Reactions  . Spironolactone     Hair loss  . Sulfonamide Derivatives Hives   Family History  Problem Relation Age of Onset  . Alcohol abuse Paternal Uncle        x2  . Arthritis Father   . Hypertension Father   . Cancer Other   . Heart disease Mother        CHF, A Fib  . Hypertension Mother   . Cervical cancer Mother   . Leukemia Mother   . Colon cancer Maternal Aunt   . Alcohol abuse Maternal Uncle        x2  . Heart disease Maternal Grandfather   . Pancreatic cancer Maternal Aunt   . Lung cancer Maternal Uncle      Past medical history, social, surgical and family history all reviewed in electronic medical record.  No pertanent information unless stated regarding to the chief complaint.   Review of Systems:Review of systems updated and as accurate as of 06/09/17  No headache, visual changes, nausea, vomiting, diarrhea, constipation, dizziness, abdominal pain, skin rash, fevers, chills, night sweats, weight loss, swollen lymph nodes, body aches, joint swelling, muscle aches, chest pain, shortness of  breath, mood changes.   Objective  There were no vitals taken for this visit. Systems examined below as of 06/09/17   General: No apparent distress alert and oriented x3 mood and affect normal, dressed appropriately.  HEENT: Pupils equal, extraocular movements intact  Respiratory: Patient's speak in full sentences and does not appear short of breath  Cardiovascular: No lower extremity edema, non tender, no erythema  Skin: Warm dry intact with no signs of infection or rash on extremities or on axial skeleton.  Abdomen: Soft nontender  Neuro: Cranial nerves II through XII are intact, neurovascularly intact in all extremities with 2+ DTRs and 2+ pulses.  Lymph: No lymphadenopathy of posterior or anterior cervical chain or axillae bilaterally.  Gait normal with good balance and coordination.  MSK:  Non tender with full range of  motion and good stability and symmetric strength and tone of shoulders, elbows, wrist, hip, knee and ankles bilaterally.     Impression and Recommendations:     This case required medical decision making of moderate complexity.      Note: This dictation was prepared with Dragon dictation along with smaller phrase technology. Any transcriptional errors that result from this process are unintentional.

## 2017-06-09 NOTE — Assessment & Plan Note (Signed)
Repeat injection given.  Tolerated the procedure well.  Discussed icing regimen and home exercises.  Discussed which activities of doing which wants to avoid.  Patient was to increase activity slowly over the course the next several days.  We discussed the differential includes a lumbar radiculopathy and will consider this if necessary.  May need advanced imaging.  Follow-up again 4 weeks

## 2017-06-09 NOTE — Progress Notes (Signed)
[name] [address], [place] [address] Phone: [phone] Subjective:    I'm seeing this patient by the request  of:    CC: Back pain follow-up GEX:[id number]  Holly Mcmillan is a 47 y.o. female coming in with complaint of back pain.  Patient has been seen previously.  Does have known.  Patient did have hip x-ray showing mild osteoarthritic changes as well.  Did fairly well with an injection in the piriformis in May 2018 as well as physical therapy.  Patient was doing better at last follow-up which was 5 months ago.  Now worsening pain over the piriformis again.  Associated with radicular symptoms.  Denies any weakness but states it is starting to affect daily activities.  Sometimes feels like she is unable to lift her leg on a regular basis    Past Medical History:  Diagnosis Date  . Depression   . Dysmenorrhea   . Gallbladder disease   . Headache(784.0)   . Hypertension   . Hypothyroidism   . Obesity   . Ovarian cyst    Past Surgical History:  Procedure Laterality Date  . ablation    . BREAST CYST EXCISION Right 06/24/2016   Procedure: EXCISION RIGHT BREAST CYST;  Surgeon: Coralie Keens, MD;  Location: [facility];  Service: General;  Laterality: Right;  . CESAREAN SECTION  2002, 2009  . CHOLECYSTECTOMY  2005  . NASAL SINUS SURGERY    . THYROID SURGERY  2012   Social History   Socioeconomic History  . Marital status: Married    Spouse name: Not on file  . Number of children: 2  . Years of education: Not on file  . Highest education level: Not on file  Social Needs  . Financial resource strain: Not on file  . Food insecurity - worry: Not on file  . Food insecurity - inability: Not on file  . Transportation needs - medical: Not on file  . Transportation needs - non-medical: Not on file  Occupational History  . Occupation: Art therapist: Civil engineer, contracting  Tobacco Use  . Smoking status: Former Smoker     Packs/day: 0.30    Years: 20.00    Pack years: 6.00    Types: Cigarettes  . Smokeless tobacco: Never Used  Substance and Sexual Activity  . Alcohol use: No  . Drug use: No  . Sexual activity: Not Currently  Other Topics Concern  . Not on file  Social History Narrative  . Not on file   Allergies  Allergen Reactions  . Spironolactone     Hair loss  . Sulfonamide Derivatives Hives   Family History  Problem Relation Age of Onset  . Alcohol abuse Paternal Uncle        x2  . Arthritis Father   . Hypertension Father   . Cancer Other   . Heart disease Mother        CHF, A Fib  . Hypertension Mother   . Cervical cancer Mother   . Leukemia Mother   . Colon cancer Maternal Aunt   . Alcohol abuse Maternal Uncle        x2  . Heart disease Maternal Grandfather   . Pancreatic cancer Maternal Aunt   . Lung cancer Maternal Uncle      Past medical history, social, surgical and family history all reviewed in electronic medical record.  No pertanent information unless stated regarding to the chief  complaint.   Review of Systems:Review of systems updated and as accurate as of 06/09/17  No headache, visual changes, nausea, vomiting, diarrhea, constipation, dizziness, abdominal pain, skin rash, fevers, chills, night sweats, weight loss, swollen lymph nodes, body aches, joint swelling, muscle aches, chest pain, shortness of breath, mood changes.   Objective  Blood pressure 110/78, height 5\' 2"  (1.575 m), weight 200 lb (90.7 kg). Systems examined below as of 06/09/17   General: No apparent distress alert and oriented x3 mood and affect normal, dressed appropriately.  HEENT: Pupils equal, extraocular movements intact  Respiratory: Patient's speak in full sentences and does not appear short of breath  Cardiovascular: No lower extremity edema, non tender, no erythema  Skin: Warm dry intact with no signs of infection or rash on extremities or on axial skeleton.  Abdomen: Soft nontender    Neuro: Cranial nerves II through XII are intact, neurovascularly intact in all extremities with 2+ DTRs and 2+ pulses.  Lymph: No lymphadenopathy of posterior or anterior cervical chain or axillae bilaterally.  Gait normal with good balance and coordination.  MSK:  Non tender with full range of motion and good stability and symmetric strength and tone of shoulders, elbows, wrist, hip, knee and ankles bilaterally.  Back exam shows the patient does have some tenderness to palpation in the paraspinal musculature.  Patient has had some mild limited range of motion of 5 degrees in all planes.  Severely positive Corky Sox.  Significant decrease in range of motion with internal rotation of the hip compared to previous exam.  Severely tender over the piriformis.  No pain over the gluteal tendon insertion of the lateral aspect of the hip.  Mild pain over the right sacroiliac joint  Procedure: Real-time Ultrasound Guided Injection of right piriformis tendon sheath Device: GE Logiq Q7 Ultrasound guided injection is preferred based studies that show increased duration, increased effect, greater accuracy, decreased procedural pain, increased response rate, and decreased cost with ultrasound guided versus blind injection.  Verbal informed consent obtained.  Time-out conducted.  Noted no overlying erythema, induration, or other signs of local infection.  Skin prepped in a sterile fashion.  Local anesthesia: Topical Ethyl chloride.  With sterile technique and under real time ultrasound guidance: With a 22-gauge 3 inch needle patient was injected with a total of 1 cc of 0.5% Marcaine and 1 cc of Kenalog 40 mg/mL into the tendon sheath. Completed without difficulty  Pain immediately resolved suggesting accurate placement of the medication.  Advised to call if fevers/chills, erythema, induration, drainage, or persistent bleeding.  Images permanently stored and available for review in the ultrasound unit.  Impression:  Technically successful ultrasound guided injection.   Impression and Recommendations:     This case required medical decision making of moderate complexity.      Note: This dictation was prepared with Dragon dictation along with smaller phrase technology. Any transcriptional errors that result from this process are unintentional.

## 2017-06-09 NOTE — Patient Instructions (Addendum)
Good to see you  Injected the piriformis again on the right.  If no better  Then we will need to consider MRI of the back give it 2 weeks though and then call  Continue the exercises See me again in 4 weeks

## 2017-07-05 NOTE — Progress Notes (Signed)
[name] [address], [place] [address] Phone: [phone] Subjective:    I'm seeing this patient by the request  of:    JJ:OACZ pain follow up    YSA:[id number]  Holly Mcmillan is a 47 y.o. female coming in with complaint of back pain.  Patient is having symptoms that seem to be more consistent with the piriformis syndrome.  Given injection June 09, 2017.  Since then unfortunately is not making any significant improvement.  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  Having increasing radicular symptoms, worsening pain at night, more fatigue.  Patient is concerned because she feels like she is just worsening overall. Patient states that she is also noticing something different.  More groin pain.  States that it is fairly severe.  Rates the severity of pain is 8 out of 10.  Feels like she has had this for quite some time    Past Medical History:  Diagnosis Date  . Depression   . Dysmenorrhea   . Gallbladder disease   . Headache(784.0)   . Hypertension   . Hypothyroidism   . Obesity   . Ovarian cyst    Past Surgical History:  Procedure Laterality Date  . ablation    . BREAST CYST EXCISION Right 06/24/2016   Procedure: EXCISION RIGHT BREAST CYST;  Surgeon: Coralie Keens, MD;  Location: [facility];  Service: General;  Laterality: Right;  . CESAREAN SECTION  2002, 2009  . CHOLECYSTECTOMY  2005  . NASAL SINUS SURGERY    . THYROID SURGERY  2012   Social History   Socioeconomic History  . Marital status: Married    Spouse name: None  . Number of children: 2  . Years of education: None  . Highest education level: None  Social Needs  . Financial resource strain: None  . Food insecurity - worry: None  . Food insecurity - inability: None  . Transportation needs - medical: None  . Transportation needs - non-medical: None  Occupational History  . Occupation: Art therapist: Brewing technologist  Tobacco Use  . Smoking status: Former Smoker    Packs/day: 0.30    Years: 20.00    Pack years: 6.00    Types: Cigarettes  . Smokeless tobacco: Never Used  Substance and Sexual Activity  . Alcohol use: No  . Drug use: No  . Sexual activity: Not Currently  Other Topics Concern  . None  Social History Narrative  . None   Allergies  Allergen Reactions  . Spironolactone     Hair loss  . Sulfonamide Derivatives Hives   Family History  Problem Relation Age of Onset  . Alcohol abuse Paternal Uncle        x2  . Arthritis Father   . Hypertension Father   . Cancer Other   . Heart disease Mother        CHF, A Fib  . Hypertension Mother   . Cervical cancer Mother   . Leukemia Mother   . Colon cancer Maternal Aunt   . Alcohol abuse Maternal Uncle        x2  . Heart disease Maternal Grandfather   . Pancreatic cancer Maternal Aunt   . Lung cancer Maternal Uncle      Past medical history, social, surgical and family history all reviewed in electronic medical record.  No pertanent information unless stated regarding to the chief complaint.  Review of Systems:Review of systems updated and as accurate as of 07/06/17  No headache, visual changes, nausea, vomiting, diarrhea, constipation, dizziness, abdominal pain, skin rash, fevers, chills, night sweats, weight loss, swollen lymph nodes, body aches, joint swelling,  chest pain, shortness of breath, mood changes.  Positive muscle aches  Objective  Blood pressure 118/80, pulse 83, height 5\' 2"  (1.575 m), weight 200 lb (90.7 kg), SpO2 96 %. Systems examined below as of 07/06/17   General: No apparent distress alert and oriented x3 mood and affect normal, dressed appropriately.  HEENT: Pupils equal, extraocular movements intact  Respiratory: Patient's speak in full sentences and does not appear short of breath  Cardiovascular: No lower extremity edema, non tender, no erythema  Skin: Warm dry intact with no signs of infection or  rash on extremities or on axial skeleton.  Abdomen: Soft nontender  Neuro: Cranial nerves II through XII are intact, neurovascularly intact in all extremities with 2+ DTRs and 2+ pulses.  Lymph: No lymphadenopathy of posterior or anterior cervical chain or axillae bilaterally.  Gait antalgic gait MSK:  Non tender with full range of motion and good stability and symmetric strength and tone of shoulders, elbows, wrist, knee and ankles bilaterally.  Back exam shows the patient is having decreasing normal range of motion.  Patient has positive Corky Sox test.  Patient does have a positive straight leg test on the right.  Mild decrease in internal range of motion of the hip again as well.  This is worsened from previous exam.  Significant decrease in range of motion of external rotation of the hip as well.  Neurovascularly intact distally but has 4 out of 5 strength of the lower extremity compared to the contralateral side.   Impression and Recommendations:     This case required medical decision making of moderate complexity.      Note: This dictation was prepared with Dragon dictation along with smaller phrase technology. Any transcriptional errors that result from this process are unintentional.

## 2017-07-06 ENCOUNTER — Encounter: Payer: Self-pay | Admitting: Family Medicine

## 2017-07-06 ENCOUNTER — Ambulatory Visit (INDEPENDENT_AMBULATORY_CARE_PROVIDER_SITE_OTHER): Payer: 59 | Admitting: Family Medicine

## 2017-07-06 VITALS — BP 118/80 | HR 83 | Ht 62.0 in | Wt 200.0 lb

## 2017-07-06 DIAGNOSIS — M5416 Radiculopathy, lumbar region: Secondary | ICD-10-CM | POA: Insufficient documentation

## 2017-07-06 DIAGNOSIS — M549 Dorsalgia, unspecified: Secondary | ICD-10-CM | POA: Diagnosis not present

## 2017-07-06 DIAGNOSIS — M25551 Pain in right hip: Secondary | ICD-10-CM | POA: Diagnosis not present

## 2017-07-06 NOTE — Assessment & Plan Note (Signed)
Patient has more of a lumbar radiculopathy patient is also having right hip pain.  Seem to be more associated with the piriformis but has failed all conservative therapy including formal physical therapy, icing, medications, as well as injections.  Patient does have known moderate osteoarthritic changes of the back.  I feel an MRI is necessary to evaluate for any type of nerve related injury that could be contributing.  Patient would be a candidate for nerve root injections or epidurals.  Would also secondary to the decreased range of motion of the hip have an MRI of the hip.  Rule out avascular necrosis that could be contributing as well.  Also rule out femoral acetabular impingement.  X-rays had been relatively normal with very mild arthritis.  Patient will come back after the advanced imaging will discuss further management after that.

## 2017-07-06 NOTE — Patient Instructions (Signed)
Good to see you  We will get MRI of the hip and the back  We will see what it shows.

## 2017-07-08 ENCOUNTER — Ambulatory Visit: Payer: 59 | Admitting: Family Medicine

## 2017-07-09 ENCOUNTER — Telehealth: Payer: Self-pay | Admitting: Internal Medicine

## 2017-07-09 NOTE — Telephone Encounter (Signed)
Called patient and let her know that the referral has be entered and that [facility] will contact her to schedule.

## 2017-07-09 NOTE — Telephone Encounter (Signed)
Copied from [place]. Topic: Referral - Status >> Jul 09, 2017 10:13 AM Holly Mcmillan, Helene Kelp D wrote: Reason for CRM: Patient called and would like the status of her MRI. Please call patient back, thanks.

## 2017-07-18 ENCOUNTER — Ambulatory Visit
Admission: RE | Admit: 2017-07-18 | Discharge: 2017-07-18 | Disposition: A | Discharge status: Home / Self Care | Payer: 59 | Source: Ambulatory Visit | Attending: Family Medicine | Admitting: Family Medicine

## 2017-07-18 DIAGNOSIS — M549 Dorsalgia, unspecified: Secondary | ICD-10-CM

## 2017-07-18 DIAGNOSIS — M25551 Pain in right hip: Secondary | ICD-10-CM

## 2017-07-19 ENCOUNTER — Encounter: Payer: Self-pay | Admitting: Family Medicine

## 2017-07-19 DIAGNOSIS — M5416 Radiculopathy, lumbar region: Secondary | ICD-10-CM

## 2017-07-28 ENCOUNTER — Telehealth: Payer: Self-pay | Admitting: Internal Medicine

## 2017-07-28 NOTE — Telephone Encounter (Signed)
Copied from [place] [phone]. Topic: Quick Communication - See Telephone Encounter >> Jul 28, 2017  3:36 PM Cleaster Corin, NT wrote: CRM for notification. See Telephone encounter for: 07/28/17.  Pt. Calling to speak with Dr. Tamala Julian or nurse about trying to get a shot for her hip pian. Pt. Would like more info. About shot and possibly scheduling an appt. To have the shot done. Pt. Callback number [phone]

## 2017-07-29 NOTE — Telephone Encounter (Signed)
Spoke to pt, she would like to go ahead with nerve root injection in her back. She will call [facility] to schedule appt.

## 2017-08-01 ENCOUNTER — Other Ambulatory Visit: Payer: Self-pay | Admitting: Internal Medicine

## 2017-08-01 DIAGNOSIS — F325 Major depressive disorder, single episode, in full remission: Secondary | ICD-10-CM

## 2017-08-06 ENCOUNTER — Other Ambulatory Visit: Payer: Self-pay | Admitting: Family Medicine

## 2017-08-06 ENCOUNTER — Ambulatory Visit
Admission: RE | Admit: 2017-08-06 | Discharge: 2017-08-06 | Disposition: A | Discharge status: Home / Self Care | Payer: 59 | Source: Ambulatory Visit | Attending: Family Medicine | Admitting: Family Medicine

## 2017-08-06 DIAGNOSIS — M5416 Radiculopathy, lumbar region: Secondary | ICD-10-CM

## 2017-08-06 MED ORDER — IOPAMIDOL (ISOVUE-M 200) INJECTION 41%
1.0000 mL | Freq: Once | INTRAMUSCULAR | Status: AC
  Administered 2017-08-06: 1 mL via EPIDURAL

## 2017-08-06 MED ORDER — METHYLPREDNISOLONE ACETATE 40 MG/ML INJ SUSP (RADIOLOG
120.0000 mg | Freq: Once | INTRAMUSCULAR | Status: AC
  Administered 2017-08-06: 120 mg via EPIDURAL

## 2017-08-06 NOTE — Discharge Instructions (Signed)

## 2017-08-19 ENCOUNTER — Encounter: Payer: Self-pay | Admitting: Family Medicine

## 2017-08-19 ENCOUNTER — Ambulatory Visit (INDEPENDENT_AMBULATORY_CARE_PROVIDER_SITE_OTHER): Payer: 59 | Admitting: Family Medicine

## 2017-08-19 DIAGNOSIS — S9031XA Contusion of right foot, initial encounter: Secondary | ICD-10-CM | POA: Diagnosis not present

## 2017-08-19 DIAGNOSIS — M5416 Radiculopathy, lumbar region: Secondary | ICD-10-CM

## 2017-08-19 MED ORDER — VITAMIN D (ERGOCALCIFEROL) 1.25 MG (50000 UNIT) PO CAPS: 50000.0000 [IU] | ORAL_CAPSULE | ORAL | 0 refills | Status: DC

## 2017-08-19 NOTE — Assessment & Plan Note (Signed)
Heel contusion.  Discussed icing regimen and home exercises.  Discussed which activities to doing which wants to avoid.  Patient is to increase activity as tolerated.  Follow-up with me again 4-6 weeks

## 2017-08-19 NOTE — Patient Instructions (Signed)
Good to see you  You are doing great  Ice is still your friend OK to lift but only 3 times a week max and 50% of what you think you can do and increase 10%a week.  For the heel a heel donut would be great  Xerlero, dansko, Hoka shoes could be good.  Once weekly vitamin D instead of the daily  See me again in 4 weeks

## 2017-08-19 NOTE — Assessment & Plan Note (Signed)
Significant improvement after the injection.  Doing very well.  Encourage patient continue the other medications.  Icing regimen.  Patient given home exercises and encouraged to start increasing activity.  Discussed the importance of weight loss.  Follow-up again in 4 weeks

## 2017-08-19 NOTE — Progress Notes (Signed)
[facility] Holly Mcmillan, [place] [address] Phone: [phone] Subjective:      CC: Back pain follow-up  [id number]  Holly Mcmillan is a 47 y.o. female coming in with complaint of back pain. She had an epidural 2 weeks ago that help diminish her pain. She does still have some pain going down the leg but it is much less.  Patient was found to have an L2-L3 nerve impingement.  Feeling 95% better.  Patient is also complaining of right heel pain. Constant pain in the center of the heel. Feels better to wear supportive shoes. Patient said it feels like she stepped on something but has not stepped on anything barefooted.      Past Medical History:  Diagnosis Date  . Depression   . Dysmenorrhea   . Gallbladder disease   . Headache(784.0)   . Hypertension   . Hypothyroidism   . Obesity   . Ovarian cyst    Past Surgical History:  Procedure Laterality Date  . ablation    . BREAST CYST EXCISION Right 06/24/2016   Procedure: EXCISION RIGHT BREAST CYST;  Surgeon: Coralie Keens, MD;  Location: [facility];  Service: General;  Laterality: Right;  . CESAREAN SECTION  2002, 2009  . CHOLECYSTECTOMY  2005  . NASAL SINUS SURGERY    . THYROID SURGERY  2012   Social History   Socioeconomic History  . Marital status: Married    Spouse name: Not on file  . Number of children: 2  . Years of education: Not on file  . Highest education level: Not on file  Occupational History  . Occupation: Art therapist: Civil engineer, contracting  Social Needs  . Financial resource strain: Not on file  . Food insecurity:    Worry: Not on file    Inability: Not on file  . Transportation needs:    Medical: Not on file    Non-medical: Not on file  Tobacco Use  . Smoking status: Former Smoker    Packs/day: 0.30    Years: 20.00    Pack years: 6.00    Types: Cigarettes  . Smokeless tobacco: Never Used  Substance and Sexual Activity  . Alcohol  use: No  . Drug use: No  . Sexual activity: Not Currently  Lifestyle  . Physical activity:    Days per week: Not on file    Minutes per session: Not on file  . Stress: Not on file  Relationships  . Social connections:    Talks on phone: Not on file    Gets together: Not on file    Attends religious service: Not on file    Active member of club or organization: Not on file    Attends meetings of clubs or organizations: Not on file    Relationship status: Not on file  Other Topics Concern  . Not on file  Social History Narrative  . Not on file   Allergies  Allergen Reactions  . Spironolactone     Hair loss  . Sulfonamide Derivatives Hives   Family History  Problem Relation Age of Onset  . Alcohol abuse Paternal Uncle        x2  . Arthritis Father   . Hypertension Father   . Cancer Other   . Heart disease Mother        CHF, A Fib  . Hypertension Mother   . Cervical cancer Mother   .  Leukemia Mother   . Colon cancer Maternal Aunt   . Alcohol abuse Maternal Uncle        x2  . Heart disease Maternal Grandfather   . Pancreatic cancer Maternal Aunt   . Lung cancer Maternal Uncle      Past medical history, social, surgical and family history all reviewed in electronic medical record.  No pertanent information unless stated regarding to the chief complaint.   Review of Systems:Review of systems updated and as accurate as of 08/19/17  No headache, visual changes, nausea, vomiting, diarrhea, constipation, dizziness, abdominal pain, skin rash, fevers, chills, night sweats, weight loss, swollen lymph nodes, body aches, joint swelling, muscle aches, chest pain, shortness of breath, mood changes.   Objective  Blood pressure (!) 118/92, pulse 77, height 5\' 2"  (1.575 m), weight 200 lb (90.7 kg), SpO2 97 %. Systems examined below as of 08/19/17   General: No apparent distress alert and oriented x3 mood and affect normal, dressed appropriately.  HEENT: Pupils equal, extraocular  movements intact  Respiratory: Patient's speak in full sentences and does not appear short of breath  Cardiovascular: No lower extremity edema, non tender, no erythema  Skin: Warm dry intact with no signs of infection or rash on extremities or on axial skeleton.  Abdomen: Soft nontender  Neuro: Cranial nerves II through XII are intact, neurovascularly intact in all extremities with 2+ DTRs and 2+ pulses.  Lymph: No lymphadenopathy of posterior or anterior cervical chain or axillae bilaterally.  Gait normal with good balance and coordination.  MSK:  Non tender with full range of motion and good stability and symmetric strength and tone of shoulders, elbows, wrist, hip, knee and ankles bilaterally.  Back Exam:  Inspection: Loss of lordosis Motion: Flexion 45 deg, Extension 25 deg, Side Bending to 35 deg bilaterally,  Rotation to 35 deg bilaterally  SLR laying: Negative  XSLR laying: Negative  Palpable tenderness: Tender to palpation in the paraspinal musculature lumbar spine right greater than left. FABER: Tightness bilaterally. Sensory change: Gross sensation intact to all lumbar and sacral dermatomes.  Reflexes: 2+ at both patellar tendons, 2+ at achilles tendons, Babinski's downgoing.  Strength at foot  Plantar-flexion: 5/5 Dorsi-flexion: 5/5 Eversion: 5/5 Inversion: 5/5  Leg strength  Quad: 5/5 Hamstring: 5/5 Hip flexor: 5/5 Hip abductors: 4/5 but symmetric Gait unremarkable.   Foot exam shows the patient does have tenderness over the plantar aspect of the heel.  Moderate to severe tenderness.  Patient no pain over the medial calcaneal area.     Impression and Recommendations:     This case required medical decision making of moderate complexity.      Note: This dictation was prepared with Dragon dictation along with smaller phrase technology. Any transcriptional errors that result from this process are unintentional.

## 2017-08-27 ENCOUNTER — Other Ambulatory Visit: Payer: Self-pay | Admitting: Internal Medicine

## 2017-08-27 DIAGNOSIS — E039 Hypothyroidism, unspecified: Secondary | ICD-10-CM

## 2017-09-16 ENCOUNTER — Ambulatory Visit (INDEPENDENT_AMBULATORY_CARE_PROVIDER_SITE_OTHER): Payer: 59 | Admitting: Family Medicine

## 2017-09-16 ENCOUNTER — Encounter: Payer: Self-pay | Admitting: Family Medicine

## 2017-09-16 DIAGNOSIS — M5416 Radiculopathy, lumbar region: Secondary | ICD-10-CM

## 2017-09-16 NOTE — Progress Notes (Signed)
[name] [address] [place], [place] [address] Phone: [phone] Subjective:     CC: Back and hip pain follow-up  [id number]  Holly Mcmillan is a 47 y.o. female coming in with complaint of back and hip pain follow-up.  Patient was found to have L2-L3 nerve root impingement on the right side giving patient a lateral aspect nerve pain.  Patient was given an epidural and states that she is doing about 50 to 80% better.  Making progress.  Increasing activity.  Patient denies any new symptoms.  No radiation of the leg is almost completely gone.  More of a soreness.      Past Medical History:  Diagnosis Date  . Depression   . Dysmenorrhea   . Gallbladder disease   . Headache(784.0)   . Hypertension   . Hypothyroidism   . Obesity   . Ovarian cyst    Past Surgical History:  Procedure Laterality Date  . ablation    . BREAST CYST EXCISION Right 06/24/2016   Procedure: EXCISION RIGHT BREAST CYST;  Surgeon: Coralie Keens, MD;  Location: [facility];  Service: General;  Laterality: Right;  . CESAREAN SECTION  2002, 2009  . CHOLECYSTECTOMY  2005  . NASAL SINUS SURGERY    . THYROID SURGERY  2012   Social History   Socioeconomic History  . Marital status: Married    Spouse name: Not on file  . Number of children: 2  . Years of education: Not on file  . Highest education level: Not on file  Occupational History  . Occupation: Art therapist: Civil engineer, contracting  Social Needs  . Financial resource strain: Not on file  . Food insecurity:    Worry: Not on file    Inability: Not on file  . Transportation needs:    Medical: Not on file    Non-medical: Not on file  Tobacco Use  . Smoking status: Former Smoker    Packs/day: 0.30    Years: 20.00    Pack years: 6.00    Types: Cigarettes  . Smokeless tobacco: Never Used  Substance and Sexual Activity  . Alcohol use: No  . Drug use: No  . Sexual activity: Not Currently    Lifestyle  . Physical activity:    Days per week: Not on file    Minutes per session: Not on file  . Stress: Not on file  Relationships  . Social connections:    Talks on phone: Not on file    Gets together: Not on file    Attends religious service: Not on file    Active member of club or organization: Not on file    Attends meetings of clubs or organizations: Not on file    Relationship status: Not on file  Other Topics Concern  . Not on file  Social History Narrative  . Not on file   Allergies  Allergen Reactions  . Spironolactone     Hair loss  . Sulfonamide Derivatives Hives   Family History  Problem Relation Age of Onset  . Alcohol abuse Paternal Uncle        x2  . Arthritis Father   . Hypertension Father   . Cancer Other   . Heart disease Mother        CHF, A Fib  . Hypertension Mother   . Cervical cancer Mother   . Leukemia Mother   . Colon cancer Maternal Aunt   .  Alcohol abuse Maternal Uncle        x2  . Heart disease Maternal Grandfather   . Pancreatic cancer Maternal Aunt   . Lung cancer Maternal Uncle      Past medical history, social, surgical and family history all reviewed in electronic medical record.  No pertanent information unless stated regarding to the chief complaint.   Review of Systems:Review of systems updated and as accurate as of 09/16/17  No headache, visual changes, nausea, vomiting, diarrhea, constipation, dizziness, abdominal pain, skin rash, fevers, chills, night sweats, weight loss, swollen lymph nodes, body aches, joint swelling, chest pain, shortness of breath, mood changes.  Positive muscle aches  Objective  Blood pressure 120/90, pulse 66, height 5\' 2"  (1.575 m), weight 204 lb (92.5 kg), SpO2 95 %. Systems examined below as of 09/16/17   General: No apparent distress alert and oriented x3 mood and affect normal, dressed appropriately.  HEENT: Pupils equal, extraocular movements intact  Respiratory: Patient's speak in full  sentences and does not appear short of breath  Cardiovascular: No lower extremity edema, non tender, no erythema  Skin: Warm dry intact with no signs of infection or rash on extremities or on axial skeleton.  Abdomen: Soft nontender  Neuro: Cranial nerves II through XII are intact, neurovascularly intact in all extremities with 2+ DTRs and 2+ pulses.  Lymph: No lymphadenopathy of posterior or anterior cervical chain or axillae bilaterally.  Gait normal with good balance and coordination.  MSK:  Non tender with full range of motion and good stability and symmetric strength and tone of shoulders, elbows, wrist, hip, knee and ankles bilaterally.   Back Exam:  Inspection: Loss of lordosis Motion: Flexion 45 deg, Extension 25 deg, Side Bending to 35 deg bilaterally,  Rotation to 45 deg bilaterally  SLR laying: Negative  XSLR laying: Negative  Palpable tenderness: Tender to palpation over the paraspinal musculature lumbar spine.Marland Kitchen FABER: Mild positive Corky Sox on the right still. Sensory change: Gross sensation intact to all lumbar and sacral dermatomes.  Reflexes: 2+ at both patellar tendons, 2+ at achilles tendons, Babinski's downgoing.  Strength at foot  Plantar-flexion: 5/5 Dorsi-flexion: 5/5 Eversion: 5/5 Inversion: 5/5  Leg strength  Quad: 5/5 Hamstring: 5/5 Hip flexor: 5/5 Hip abductors: 5/5  Gait unremarkable.      Impression and Recommendations:     This case required medical decision making of moderate complexity.      Note: This dictation was prepared with Dragon dictation along with smaller phrase technology. Any transcriptional errors that result from this process are unintentional.

## 2017-09-16 NOTE — Assessment & Plan Note (Signed)
Responded well to the epidural.  Discussed icing regimen and home exercise.  Discussed which activities to do which wants to avoid.  Patient will come back and see me again in 2 months if any worsening symptoms will call me if we need a repeat injection.

## 2017-09-16 NOTE — Patient Instructions (Signed)
Good to see you  You are doing great  Keep it up  Stay active  Ok to do boot camp but avoid jumping or high impact.  See me again in 2 months

## 2017-10-05 ENCOUNTER — Encounter: Payer: Self-pay | Admitting: Internal Medicine

## 2017-10-05 ENCOUNTER — Other Ambulatory Visit (INDEPENDENT_AMBULATORY_CARE_PROVIDER_SITE_OTHER): Payer: 59

## 2017-10-05 ENCOUNTER — Ambulatory Visit (INDEPENDENT_AMBULATORY_CARE_PROVIDER_SITE_OTHER): Payer: 59 | Admitting: Internal Medicine

## 2017-10-05 VITALS — BP 122/84 | HR 71 | Temp 98.0°F | Resp 16 | Ht 62.0 in | Wt 202.0 lb

## 2017-10-05 DIAGNOSIS — I10 Essential (primary) hypertension: Secondary | ICD-10-CM

## 2017-10-05 DIAGNOSIS — E039 Hypothyroidism, unspecified: Secondary | ICD-10-CM

## 2017-10-05 LAB — BASIC METABOLIC PANEL WITH GFR
BUN: 16 mg/dL (ref 6–23)
CO2: 30 meq/L (ref 19–32)
Calcium: 9.9 mg/dL (ref 8.4–10.5)
Chloride: 100 meq/L (ref 96–112)
Creatinine, Ser: 0.81 mg/dL (ref 0.40–1.20)
GFR: 80.11 mL/min
Glucose, Bld: 86 mg/dL (ref 70–99)
Potassium: 3.8 meq/L (ref 3.5–5.1)
Sodium: 140 meq/L (ref 135–145)

## 2017-10-05 LAB — TSH: TSH: 0.68 u[IU]/mL (ref 0.35–4.50)

## 2017-10-05 NOTE — Progress Notes (Addendum)
Subjective:  Patient ID: Holly Mcmillan, female    DOB: [birth year]  Age: 47 y.o. MRN: [id number]  CC: Hypertension and Hypothyroidism   HPI Holly Mcmillan presents for f/up - she complains of fatigue and inability to lose weight despite working on her lifestyle modifications.  Outpatient Medications Prior to Visit  Medication Sig Dispense Refill  . buPROPion (WELLBUTRIN XL) 300 MG 24 hr tablet Take 1 tablet (300 mg total) by mouth daily. 90 tablet 1  . Dermatological Products, Misc. [facility]) lotion Apply 1 Act topically 2 (two) times daily with a meal. 90 g 0  . DUEXIS 800-26.6 MG TABS TAKE ONE TABLET BY MOUTH THREE TIMES DAILY AS NEEDED 90 tablet 2  . DULoxetine (CYMBALTA) 30 MG capsule TAKE 1 CAPSULE BY MOUTH EVERY DAY 90 capsule 1  . levothyroxine (SYNTHROID, LEVOTHROID) 175 MCG tablet TAKE 1 TABLET (175 MCG TOTAL) BY MOUTH DAILY BEFORE BREAKFAST. 90 tablet 0  . telmisartan-hydrochlorothiazide (MICARDIS HCT) 40-12.5 MG tablet Take 1 tablet by mouth daily. 90 tablet 1  . Vitamin D, Ergocalciferol, (DRISDOL) 50000 units CAPS capsule Take 1 capsule (50,000 Units total) by mouth every 7 (seven) days. 12 capsule 0  . gabapentin (NEURONTIN) 300 MG capsule Take 1 capsule (300 mg total) by mouth at bedtime. 30 capsule 3   No facility-administered medications prior to visit.     ROS Review of Systems  Constitutional: Positive for fatigue. Negative for appetite change, chills, diaphoresis and unexpected weight change.  HENT: Negative.   Eyes: Negative.  Negative for visual disturbance.  Respiratory: Negative.  Negative for cough, chest tightness, shortness of breath and wheezing.   Cardiovascular: Negative for chest pain, palpitations and leg swelling.  Gastrointestinal: Negative for abdominal pain, constipation, diarrhea, nausea and vomiting.  Endocrine: Negative for cold intolerance and heat intolerance.  Genitourinary: Negative.  Negative for difficulty urinating.  Musculoskeletal:  Positive for back pain. Negative for arthralgias, myalgias and neck pain.  Skin: Negative.  Negative for color change and rash.  Neurological: Negative.  Negative for dizziness, weakness and light-headedness.  Hematological: Negative for adenopathy. Does not bruise/bleed easily.  Psychiatric/Behavioral: Negative.     Objective:  BP 122/84 (BP Location: Left Arm, Patient Position: Sitting, Cuff Size: Large)   Pulse 71   Temp 98 F (36.7 C) (Oral)   Resp 16   Ht 5\' 2"  (1.575 m)   Wt 202 lb (91.6 kg)   SpO2 98%   BMI 36.95 kg/m   BP Readings from Last 3 Encounters:  10/05/17 122/84  09/16/17 120/90  08/19/17 (!) 118/92    Wt Readings from Last 3 Encounters:  10/05/17 202 lb (91.6 kg)  09/16/17 204 lb (92.5 kg)  08/19/17 200 lb (90.7 kg)    Physical Exam  Constitutional: She is oriented to person, place, and time. No distress.  HENT:  Mouth/Throat: Oropharynx is clear and moist. No oropharyngeal exudate.  Eyes: Conjunctivae are normal. No scleral icterus.  Neck: Normal range of motion. Neck supple. No JVD present. No thyromegaly present.  Cardiovascular: Normal rate, regular rhythm and normal heart sounds.  No murmur heard. Pulmonary/Chest: Effort normal and breath sounds normal. No respiratory distress. She has no wheezes. She has no rales.  Abdominal: Soft. Bowel sounds are normal. She exhibits no mass. There is no hepatosplenomegaly. There is no tenderness.  Musculoskeletal: Normal range of motion. She exhibits no edema, tenderness or deformity.  Neurological: She is alert and oriented to person, place, and time.  Skin: Skin is warm  and dry. She is not diaphoretic.  Vitals reviewed.   Lab Results  Component Value Date   WBC 10.9 (H) 05/26/2017   HGB 14.4 05/26/2017   HCT 43.1 05/26/2017   PLT 344.0 05/26/2017   GLUCOSE 86 10/05/2017   CHOL 252 (H) 05/26/2017   TRIG 189.0 (H) 05/26/2017   HDL 57.10 05/26/2017   LDLDIRECT 164.9 01/15/2014   LDLCALC 157 (H)  05/26/2017   ALT 31 05/26/2017   AST 20 05/26/2017   NA 140 10/05/2017   K 3.8 10/05/2017   CL 100 10/05/2017   CREATININE 0.81 10/05/2017   BUN 16 10/05/2017   CO2 30 10/05/2017   TSH 0.68 10/05/2017   HGBA1C 5.5 03/16/2016    Dg Epidural/nerve Root  Result Date: 08/06/2017 CLINICAL DATA:  Lumbosacral spondylosis without myelopathy. RIGHT leg radicular symptoms. Far-lateral and foraminal protrusion at L2-3 on the RIGHT, with marked osseous spurring. Disc pathology at L4-5 is LEFT-sided, and does not appear to result in significant RIGHT-sided subarticular zone or foraminal zone narrowing at that level. EXAM: EPIDURAL/NERVE ROOT FLUOROSCOPY TIME:  36 seconds corresponding to a Dose Area Product of 44 Gy*m2 PROCEDURE: The procedure, risks, benefits, and alternatives were explained to the patient. Questions regarding the procedure were encouraged and answered. The patient understands and consents to the procedure. Timeout was performed. An appropriate skin entry site was chosen, cleansed with Betadine, and anesthetized with 1% lidocaine. RIGHT L2 NERVE ROOT BLOCK AND TRANSFORAMINAL EPIDURAL: A posterior oblique approach was taken to the intervertebral foramen on the RIGHT at L2-L3 using a curved 22 gauge 5 inch spinal needle. Injection of Isovue-M 200 outlined the RIGHT L2 nerve root and showed good epidural spread. No vascular opacification is seen. 120.0 mg of Depo-Medrol mixed with 2 mL 1% lidocaine were instilled. The procedure was well-tolerated, and the patient was discharged thirty minutes following the injection in good condition. COMPLICATIONS: None IMPRESSION: Technically successful injection consisting of a RIGHT L2 nerve root block and transforaminal epidural. Upon review of the lumbar MR, and today's spot films at the time of injection, there is a significant mechanical component to the patient's RIGHT L2 nerve root compression/displacement, when the factors of disc protrusion, asymmetric  RIGHT-sided loss of disc height at L2-3, and large extraforaminal spurs are combined. The patient may require more than one transforaminal injection for durable relief. Electronically Signed   By: Staci Righter M.D.   On: 08/06/2017 08:34    Assessment & Plan:   Holly Mcmillan was seen today for hypertension and hypothyroidism.  Diagnoses and all orders for this visit:  Acquired hypothyroidism- Her TSH is in the normal range.  She will remain on the current dose of levothyroxine. -     TSH; Future  Essential hypertension- Her blood pressure is well controlled.  Electrolytes and renal function are normal. -     Basic metabolic panel; Future   I have discontinued Holly Mcmillan. Capp's gabapentin. I am also having her maintain her DUEXIS, EPICERAM, telmisartan-hydrochlorothiazide, buPROPion, DULoxetine, Vitamin D (Ergocalciferol), and levothyroxine.  No orders of the defined types were placed in this encounter.    Follow-up: Return in about 4 months (around 02/04/2018).  Scarlette Calico, MD

## 2017-10-05 NOTE — Patient Instructions (Signed)
Hypothyroidism Hypothyroidism is a disorder of the thyroid. The thyroid is a large gland that is located in the lower front of the neck. The thyroid releases hormones that control how the body works. With hypothyroidism, the thyroid does not make enough of these hormones. What are the causes? Causes of hypothyroidism may include:  Viral infections.  Pregnancy.  Your own defense system (immune system) attacking your thyroid.  Certain medicines.  Birth defects.  Past radiation treatments to your head or neck.  Past treatment with radioactive iodine.  Past surgical removal of part or all of your thyroid.  Problems with the gland that is located in the center of your brain (pituitary).  What are the signs or symptoms? Signs and symptoms of hypothyroidism may include:  Feeling as though you have no energy (lethargy).  Inability to tolerate cold.  Weight gain that is not explained by a change in diet or exercise habits.  Dry skin.  Coarse hair.  Menstrual irregularity.  Slowing of thought processes.  Constipation.  Sadness or depression.  How is this diagnosed? Your health care provider may diagnose hypothyroidism with blood tests and ultrasound tests. How is this treated? Hypothyroidism is treated with medicine that replaces the hormones that your body does not make. After you begin treatment, it may take several weeks for symptoms to go away. Follow these instructions at home:  Take medicines only as directed by your health care provider.  If you start taking any new medicines, tell your health care provider.  Keep all follow-up visits as directed by your health care provider. This is important. As your condition improves, your dosage needs may change. You will need to have blood tests regularly so that your health care provider can watch your condition. Contact a health care provider if:  Your symptoms do not get better with treatment.  You are taking thyroid  replacement medicine and: ? You sweat excessively. ? You have tremors. ? You feel anxious. ? You lose weight rapidly. ? You cannot tolerate heat. ? You have emotional swings. ? You have diarrhea. ? You feel weak. Get help right away if:  You develop chest pain.  You develop an irregular heartbeat.  You develop a rapid heartbeat. This information is not intended to replace advice given to you by your health care provider. Make sure you discuss any questions you have with your health care provider. Document Released: 04/20/2005 Document Revised: 09/26/2015 Document Reviewed: 09/05/2013 Elsevier Interactive Patient Education  2018 Elsevier Inc.  

## 2017-10-13 LAB — HM PAP SMEAR: HM PAP: NEGATIVE

## 2017-10-15 LAB — HM MAMMOGRAPHY

## 2017-11-06 ENCOUNTER — Other Ambulatory Visit: Payer: Self-pay | Admitting: Family Medicine

## 2017-11-17 ENCOUNTER — Ambulatory Visit: Payer: Self-pay

## 2017-11-17 ENCOUNTER — Ambulatory Visit (INDEPENDENT_AMBULATORY_CARE_PROVIDER_SITE_OTHER): Payer: 59 | Admitting: Family Medicine

## 2017-11-17 ENCOUNTER — Encounter: Payer: Self-pay | Admitting: Family Medicine

## 2017-11-17 ENCOUNTER — Ambulatory Visit (INDEPENDENT_AMBULATORY_CARE_PROVIDER_SITE_OTHER)
Admission: RE | Admit: 2017-11-17 | Discharge: 2017-11-17 | Disposition: A | Discharge status: Home / Self Care | Payer: 59 | Source: Ambulatory Visit | Attending: Family Medicine | Admitting: Family Medicine

## 2017-11-17 VITALS — BP 118/84 | HR 66 | Ht 62.0 in | Wt 204.0 lb

## 2017-11-17 DIAGNOSIS — M5416 Radiculopathy, lumbar region: Secondary | ICD-10-CM | POA: Diagnosis not present

## 2017-11-17 DIAGNOSIS — M25559 Pain in unspecified hip: Secondary | ICD-10-CM

## 2017-11-17 DIAGNOSIS — M1611 Unilateral primary osteoarthritis, right hip: Secondary | ICD-10-CM | POA: Diagnosis not present

## 2017-11-17 NOTE — Assessment & Plan Note (Signed)
Still believe that some of the pain is likely secondary to more of a lumbar radiculopathy.  Did respond somewhat to the nerve injection especially with the radicular symptoms and the numbness.  Patient will have a repeat at this time.  Hopefully will be much more beneficial for a longer duration.  Patient will follow-up 2 to 3 weeks afterwards.  Patient denied wanting any type of medication changes at this time.

## 2017-11-17 NOTE — Progress Notes (Signed)
[facility] [address], [place] [address] Phone: [phone] Subjective:    CC: Right hip pain  [id number]  Holly Mcmillan is a 47 y.o. female coming in with complaint of right hip pain.  Patient has had difficulty with this before.  Found to have more of a possible nerve root impingement as well as a focal finding of arthritis of the right hip.  Patient did respond fairly well to an epidural injection and a nerve root injection of the back in April.  Started having worsening pain with the radicular symptoms of the numbness coming back.  Feels that that is contributing from the back.  Patient was concerned      Past Medical History:  Diagnosis Date  . Depression   . Dysmenorrhea   . Gallbladder disease   . Headache(784.0)   . Hypertension   . Hypothyroidism   . Obesity   . Ovarian cyst    Past Surgical History:  Procedure Laterality Date  . ablation    . BREAST CYST EXCISION Right 06/24/2016   Procedure: EXCISION RIGHT BREAST CYST;  Surgeon: Holly Keens, MD;  Location: [facility];  Service: General;  Laterality: Right;  . CESAREAN SECTION  2002, 2009  . CHOLECYSTECTOMY  2005  . NASAL SINUS SURGERY    . THYROID SURGERY  2012   Social History   Socioeconomic History  . Marital status: Married    Spouse name: Not on file  . Number of children: 2  . Years of education: Not on file  . Highest education level: Not on file  Occupational History  . Occupation: Art therapist: Civil engineer, contracting  Social Needs  . Financial resource strain: Not on file  . Food insecurity:    Worry: Not on file    Inability: Not on file  . Transportation needs:    Medical: Not on file    Non-medical: Not on file  Tobacco Use  . Smoking status: Former Smoker    Packs/day: 0.30    Years: 20.00    Pack years: 6.00    Types: Cigarettes  . Smokeless tobacco: Never Used  Substance and Sexual Activity  . Alcohol use: No  .  Drug use: No  . Sexual activity: Not Currently  Lifestyle  . Physical activity:    Days per week: Not on file    Minutes per session: Not on file  . Stress: Not on file  Relationships  . Social connections:    Talks on phone: Not on file    Gets together: Not on file    Attends religious service: Not on file    Active member of club or organization: Not on file    Attends meetings of clubs or organizations: Not on file    Relationship status: Not on file  Other Topics Concern  . Not on file  Social History Narrative  . Not on file   Allergies  Allergen Reactions  . Spironolactone     Hair loss  . Sulfonamide Derivatives Hives   Family History  Problem Relation Age of Onset  . Alcohol abuse Paternal Uncle        x2  . Arthritis Father   . Hypertension Father   . Cancer Other   . Heart disease Mother        CHF, A Fib  . Hypertension Mother   . Cervical cancer Mother   . Leukemia Mother   .  Colon cancer Maternal Aunt   . Alcohol abuse Maternal Uncle        x2  . Heart disease Maternal Grandfather   . Pancreatic cancer Maternal Aunt   . Lung cancer Maternal Uncle      Past medical history, social, surgical and family history all reviewed in electronic medical record.  No pertanent information unless stated regarding to the chief complaint.   Review of Systems:Review of systems updated and as accurate as of 11/17/17  No headache, visual changes, nausea, vomiting, diarrhea, constipation, dizziness, abdominal pain, skin rash, fevers, chills, night sweats, weight loss, swollen lymph nodes, body aches, joint swelling, muscle aches, chest pain, shortness of breath, mood changes.   Objective  Blood pressure 118/84, pulse 66, height 5\' 2"  (1.575 m), weight 204 lb (92.5 kg), last menstrual period 11/10/2017, SpO2 96 %. Systems examined below as of 11/17/17   General: No apparent distress alert and oriented x3 mood and affect normal, dressed appropriately.  HEENT: Pupils  equal, extraocular movements intact  Respiratory: Patient's speak in full sentences and does not appear short of breath  Cardiovascular: No lower extremity edema, non tender, no erythema  Skin: Warm dry intact with no signs of infection or rash on extremities or on axial skeleton.  Abdomen: Soft nontender  Neuro: Cranial nerves II through XII are intact, neurovascularly intact in all extremities with 2+ DTRs and 2+ pulses.  Lymph: No lymphadenopathy of posterior or anterior cervical chain or axillae bilaterally.  Gait normal with good balance and coordination.  MSK:  Non tender with full range of motion and good stability and symmetric strength and tone of shoulders, elbows, wrist, knee and ankles bilaterally.  Right hip exam shows the patient has severe loss of range of motion more than previous exam with no internal range of motion lacking external range of motion.  Mild positive straight leg test noted.  Back pain is diffuse.    Procedure: Real-time Ultrasound Guided Injection of right hip Device: GE Logiq Q7  Ultrasound guided injection is preferred based studies that show increased duration, increased effect, greater accuracy, decreased procedural pain, increased response rate with ultrasound guided versus blind injection.  Verbal informed consent obtained.  Time-out conducted.  Noted no overlying erythema, induration, or other signs of local infection.  Skin prepped in a sterile fashion.  Local anesthesia: Topical Ethyl chloride.  With sterile technique and under real time ultrasound guidance:  Anterior capsule visualized, needle visualized going to the head neck junction at the anterior capsule. Pictures taken. Patient did have injection of  3 cc of 0.5% Marcaine, and 1 cc of Kenalog 40 mg/dL. Completed without difficulty  Pain immediately resolved suggesting accurate placement of the medication.  Advised to call if fevers/chills, erythema, induration, drainage, or persistent bleeding.    Images permanently stored and available for review in the ultrasound unit.  Impression: Technically successful ultrasound guided injection.    Impression and Recommendations:     This case required medical decision making of moderate complexity.      Note: This dictation was prepared with Dragon dictation along with smaller phrase technology. Any transcriptional errors that result from this process are unintentional.

## 2017-11-17 NOTE — Patient Instructions (Signed)
Im sorry you are hurting again  Injected the hip and we will see how much that is playing a role.  We will also do the epidural in the back again  Ice is your friend Once you have a back injection then see me again 2-3 weeks AFTER so we can see how you are doing.

## 2017-11-24 ENCOUNTER — Other Ambulatory Visit: Payer: Self-pay | Admitting: Internal Medicine

## 2017-11-24 DIAGNOSIS — E039 Hypothyroidism, unspecified: Secondary | ICD-10-CM

## 2017-11-24 DIAGNOSIS — I10 Essential (primary) hypertension: Secondary | ICD-10-CM

## 2017-11-26 ENCOUNTER — Telehealth: Payer: Self-pay | Admitting: Internal Medicine

## 2017-11-26 NOTE — Telephone Encounter (Signed)
LVM for patient to call [facility] their phone number is [phone] And she can call us back with any questions she may have.

## 2017-11-26 NOTE — Telephone Encounter (Unsigned)
Copied from [place] [id number]. Topic: Quick Communication - See Telephone Encounter >> Nov 26, 2017  2:44 PM Neva Seat wrote: Pt was referred to [facility], but hasn't received a call for an appt.  Pt is checking on status since she hasn't heard back. Please call pt back for a status.

## 2017-12-16 ENCOUNTER — Ambulatory Visit
Admission: RE | Admit: 2017-12-16 | Discharge: 2017-12-16 | Disposition: A | Discharge status: Home / Self Care | Payer: 59 | Source: Ambulatory Visit | Attending: Family Medicine | Admitting: Family Medicine

## 2017-12-16 DIAGNOSIS — M25559 Pain in unspecified hip: Secondary | ICD-10-CM

## 2017-12-16 MED ORDER — METHYLPREDNISOLONE ACETATE 40 MG/ML INJ SUSP (RADIOLOG
120.0000 mg | Freq: Once | INTRAMUSCULAR | Status: AC
  Administered 2017-12-16: 120 mg via EPIDURAL

## 2017-12-16 MED ORDER — IOPAMIDOL (ISOVUE-M 200) INJECTION 41%
1.0000 mL | Freq: Once | INTRAMUSCULAR | Status: AC
  Administered 2017-12-16: 1 mL via EPIDURAL

## 2018-01-05 ENCOUNTER — Encounter: Payer: Self-pay | Admitting: Family Medicine

## 2018-01-05 ENCOUNTER — Ambulatory Visit (INDEPENDENT_AMBULATORY_CARE_PROVIDER_SITE_OTHER): Payer: 59 | Admitting: Family Medicine

## 2018-01-05 VITALS — BP 140/78 | HR 84 | Ht 62.0 in | Wt 207.0 lb

## 2018-01-05 DIAGNOSIS — M1611 Unilateral primary osteoarthritis, right hip: Secondary | ICD-10-CM

## 2018-01-05 MED ORDER — GABAPENTIN 300 MG PO CAPS: ORAL_CAPSULE | ORAL | 3 refills | Status: DC

## 2018-01-05 NOTE — Patient Instructions (Addendum)
Good to see you  Ice can help  Gabapentin 300mg  at night Dr. Rhona Raider will be calling you and get you set up  Rerad about PRP  Otherwise make an appointment in 4 weeks in case you want to do another steroid injection or call us if you want to do the PRP

## 2018-01-05 NOTE — Assessment & Plan Note (Signed)
Patient did have a good response with patient did have an injection of the hip.  Discussed icing regimen and home exercises.  Patient has failed all conservative therapy including the injection.  Patient will be sent to an orthopedic surgeon to discuss the possibility of replacement.  We discussed other things that we could potentially do with unfortunately be PRP which would be experimental.  Patient is in agreement with the plan and will follow-up as needed with me and call if any questions

## 2018-01-05 NOTE — Progress Notes (Signed)
[name] [address] [place], [place] [address] Phone: [phone] Subjective:      I [name] am serving as a Education administrator for Dr. Hulan Saas.   CC: Hip pain follow-up  ZGY:[id number]  Holly Mcmillan is a 47 y.o. female coming in with complaint of hip pain. States that her hip feels the same as it did the last visit.  Patient was found to have a focal arthritic changes of the hip moderate to severe.  Patient's previous x-rays were independently visualized by me.  Patient states that the injection did help him by approximately 40 to 60% initially and is slowly getting worse again.  Patient did have the injection 2 months ago.  Patient states that the back injection helped the back pain but not the leg pain itself.  Wondering what else can be done.-     Past Medical History:  Diagnosis Date  . Depression   . Dysmenorrhea   . Gallbladder disease   . Headache(784.0)   . Hypertension   . Hypothyroidism   . Obesity   . Ovarian cyst    Past Surgical History:  Procedure Laterality Date  . ablation    . BREAST CYST EXCISION Right 06/24/2016   Procedure: EXCISION RIGHT BREAST CYST;  Surgeon: Coralie Keens, MD;  Location: [facility];  Service: General;  Laterality: Right;  . CESAREAN SECTION  2002, 2009  . CHOLECYSTECTOMY  2005  . NASAL SINUS SURGERY    . THYROID SURGERY  2012   Social History   Socioeconomic History  . Marital status: Married    Spouse name: Not on file  . Number of children: 2  . Years of education: Not on file  . Highest education level: Not on file  Occupational History  . Occupation: Art therapist: Civil engineer, contracting  Social Needs  . Financial resource strain: Not on file  . Food insecurity:    Worry: Not on file    Inability: Not on file  . Transportation needs:    Medical: Not on file    Non-medical: Not on file  Tobacco Use  . Smoking status: Former Smoker    Packs/day: 0.30   Years: 20.00    Pack years: 6.00    Types: Cigarettes  . Smokeless tobacco: Never Used  Substance and Sexual Activity  . Alcohol use: No  . Drug use: No  . Sexual activity: Not Currently  Lifestyle  . Physical activity:    Days per week: Not on file    Minutes per session: Not on file  . Stress: Not on file  Relationships  . Social connections:    Talks on phone: Not on file    Gets together: Not on file    Attends religious service: Not on file    Active member of club or organization: Not on file    Attends meetings of clubs or organizations: Not on file    Relationship status: Not on file  Other Topics Concern  . Not on file  Social History Narrative  . Not on file   Allergies  Allergen Reactions  . Spironolactone     Hair loss  . Sulfonamide Derivatives Hives   Family History  Problem Relation Age of Onset  . Alcohol abuse Paternal Uncle        x2  . Arthritis Father   . Hypertension Father   . Cancer Other   . Heart disease Mother  CHF, A Fib  . Hypertension Mother   . Cervical cancer Mother   . Leukemia Mother   . Colon cancer Maternal Aunt   . Alcohol abuse Maternal Uncle        x2  . Heart disease Maternal Grandfather   . Pancreatic cancer Maternal Aunt   . Lung cancer Maternal Uncle     Current Outpatient Medications (Endocrine & Metabolic):  .  levothyroxine (SYNTHROID, LEVOTHROID) 175 MCG tablet, TAKE 1 TABLET (175 MCG TOTAL) BY MOUTH DAILY BEFORE BREAKFAST.  Current Outpatient Medications (Cardiovascular):  .  telmisartan-hydrochlorothiazide (MICARDIS HCT) 40-12.5 MG tablet, TAKE 1 TABLET BY MOUTH EVERY DAY   Current Outpatient Medications (Analgesics):  Marland Kitchen  DUEXIS 800-26.6 MG TABS, TAKE ONE TABLET BY MOUTH THREE TIMES DAILY AS NEEDED   Current Outpatient Medications (Other):  Marland Kitchen  buPROPion (WELLBUTRIN XL) 300 MG 24 hr tablet, Take 1 tablet (300 mg total) by mouth daily. .  Dermatological Products, Misc. [facility]) lotion, Apply 1 Act  topically 2 (two) times daily with a meal. .  DULoxetine (CYMBALTA) 30 MG capsule, TAKE 1 CAPSULE BY MOUTH EVERY DAY .  Vitamin D, Ergocalciferol, (DRISDOL) 50000 units CAPS capsule, TAKE 1 CAPSULE (50,000 UNITS TOTAL) BY MOUTH EVERY 7 (SEVEN) DAYS. Marland Kitchen  gabapentin (NEURONTIN) 300 MG capsule, nightly    Past medical history, social, surgical and family history all reviewed in electronic medical record.  No pertanent information unless stated regarding to the chief complaint.   Review of Systems:  No headache, visual changes, nausea, vomiting, diarrhea, constipation, dizziness, abdominal pain, skin rash, fevers, chills, night sweats, weight loss, swollen lymph nodes, body aches, joint swelling chest pain, shortness of breath, mood changes.  Positive muscle aches  Objective  Blood pressure 140/78, pulse 84, height 5\' 2"  (1.575 m), weight 207 lb (93.9 kg), SpO2 91 %.    General: No apparent distress alert and oriented x3 mood and affect normal, dressed appropriately.  HEENT: Pupils equal, extraocular movements intact  Respiratory: Patient's speak in full sentences and does not appear short of breath  Cardiovascular: No lower extremity edema, non tender, no erythema  Skin: Warm dry intact with no signs of infection or rash on extremities or on axial skeleton.  Abdomen: Soft nontender  Neuro: Cranial nerves II through XII are intact, neurovascularly intact in all extremities with 2+ DTRs and 2+ pulses.  Lymph: No lymphadenopathy of posterior or anterior cervical chain or axillae bilaterally.  Gait antalgic MSK:  Non tender with full range of motion and good stability and symmetric strength and tone of shoulders, elbows, wrist,  knee and ankles bilaterally.  Patient's right hip show unfortunately no internal range of motion at this time.  Severe tenderness with internal range of motion.  Tenderness to palpation in the groin area.  Mild tenderness over the piriformis as well.  Negative straight leg  test.  Does have tightness of the hamstring.  Back exam does show some mild paraspinal musculature tenderness in the lumbar spine.     Impression and Recommendations:     This case required medical decision making of moderate complexity. The above documentation has been reviewed and is accurate and complete Lyndal Pulley, DO       Note: This dictation was prepared with Dragon dictation along with smaller phrase technology. Any transcriptional errors that result from this process are unintentional.

## 2018-01-21 ENCOUNTER — Other Ambulatory Visit: Payer: Self-pay | Admitting: Internal Medicine

## 2018-01-21 DIAGNOSIS — F325 Major depressive disorder, single episode, in full remission: Secondary | ICD-10-CM

## 2018-01-21 NOTE — Telephone Encounter (Signed)
Left patient vm to call back to schedule appt  °

## 2018-01-21 NOTE — Telephone Encounter (Signed)
Pt needs a follow up with PCP in October. She has an appt with Tamala Julian in October if PCP has any availability that day as well. I send in rx when appt is made.

## 2018-01-26 ENCOUNTER — Other Ambulatory Visit: Payer: Self-pay | Admitting: Family Medicine

## 2018-01-26 NOTE — Telephone Encounter (Signed)
Refill done.  

## 2018-02-03 ENCOUNTER — Ambulatory Visit: Payer: 59 | Admitting: Family Medicine

## 2018-02-21 ENCOUNTER — Other Ambulatory Visit (INDEPENDENT_AMBULATORY_CARE_PROVIDER_SITE_OTHER): Payer: 59

## 2018-02-21 ENCOUNTER — Ambulatory Visit (INDEPENDENT_AMBULATORY_CARE_PROVIDER_SITE_OTHER): Payer: 59 | Admitting: Internal Medicine

## 2018-02-21 ENCOUNTER — Encounter: Payer: Self-pay | Admitting: Internal Medicine

## 2018-02-21 VITALS — BP 122/80 | HR 73 | Temp 98.1°F | Resp 16 | Ht 62.0 in | Wt 209.5 lb

## 2018-02-21 DIAGNOSIS — E039 Hypothyroidism, unspecified: Secondary | ICD-10-CM

## 2018-02-21 DIAGNOSIS — Z23 Encounter for immunization: Secondary | ICD-10-CM

## 2018-02-21 DIAGNOSIS — I10 Essential (primary) hypertension: Secondary | ICD-10-CM | POA: Diagnosis not present

## 2018-02-21 DIAGNOSIS — J301 Allergic rhinitis due to pollen: Secondary | ICD-10-CM | POA: Diagnosis not present

## 2018-02-21 DIAGNOSIS — F325 Major depressive disorder, single episode, in full remission: Secondary | ICD-10-CM | POA: Diagnosis not present

## 2018-02-21 LAB — BASIC METABOLIC PANEL
BUN: 14 mg/dL (ref 6–23)
CALCIUM: 9.2 mg/dL (ref 8.4–10.5)
CHLORIDE: 102 meq/L (ref 96–112)
CO2: 28 meq/L (ref 19–32)
Creatinine, Ser: 0.89 mg/dL (ref 0.40–1.20)
GFR: 71.75 mL/min (ref >60.00)
Glucose, Bld: 83 mg/dL (ref 70–99)
Potassium: 4.3 mEq/L (ref 3.5–5.1)
SODIUM: 141 meq/L (ref 135–145)

## 2018-02-21 LAB — CBC WITH DIFFERENTIAL/PLATELET
BASOS PCT: 0.4 % (ref 0.0–3.0)
Basophils Absolute: 0.1 10*3/uL (ref 0.0–0.1)
EOS PCT: 0.4 % (ref 0.0–5.0)
Eosinophils Absolute: 0.1 10*3/uL (ref 0.0–0.7)
HCT: 43 % (ref 36.0–46.0)
Hemoglobin: 14.4 g/dL (ref 12.0–15.0)
Lymphocytes Relative: 26.6 % (ref 12.0–46.0)
Lymphs Abs: 3.3 10*3/uL (ref 0.7–4.0)
MCHC: 33.5 g/dL (ref 30.0–36.0)
MCV: 94 fl (ref 78.0–100.0)
MONO ABS: 1.1 10*3/uL — AB (ref 0.1–1.0)
Monocytes Relative: 8.8 % (ref 3.0–12.0)
NEUTROS PCT: 63.8 % (ref 43.0–77.0)
Neutro Abs: 8 10*3/uL — ABNORMAL HIGH (ref 1.4–7.7)
PLATELETS: 384 10*3/uL (ref 150.0–400.0)
RBC: 4.57 Mil/uL (ref 3.87–5.11)
RDW: 13.1 % (ref 11.5–15.5)
WBC: 12.6 10*3/uL — ABNORMAL HIGH (ref 4.0–10.5)

## 2018-02-21 LAB — TSH: TSH: 0.67 u[IU]/mL (ref 0.35–4.50)

## 2018-02-21 MED ORDER — FLUTICASONE PROPIONATE 50 MCG/ACT NA SUSP: 2.0000 | Freq: Every day | NASAL | 1 refills | Status: DC

## 2018-02-21 MED ORDER — DULOXETINE HCL 60 MG PO CPEP: 60.0000 mg | ORAL_CAPSULE | Freq: Every day | ORAL | 1 refills | Status: DC

## 2018-02-21 NOTE — Patient Instructions (Signed)
Hypothyroidism Hypothyroidism is a disorder of the thyroid. The thyroid is a large gland that is located in the lower front of the neck. The thyroid releases hormones that control how the body works. With hypothyroidism, the thyroid does not make enough of these hormones. What are the causes? Causes of hypothyroidism may include:  Viral infections.  Pregnancy.  Your own defense system (immune system) attacking your thyroid.  Certain medicines.  Birth defects.  Past radiation treatments to your head or neck.  Past treatment with radioactive iodine.  Past surgical removal of part or all of your thyroid.  Problems with the gland that is located in the center of your brain (pituitary).  What are the signs or symptoms? Signs and symptoms of hypothyroidism may include:  Feeling as though you have no energy (lethargy).  Inability to tolerate cold.  Weight gain that is not explained by a change in diet or exercise habits.  Dry skin.  Coarse hair.  Menstrual irregularity.  Slowing of thought processes.  Constipation.  Sadness or depression.  How is this diagnosed? Your health care provider may diagnose hypothyroidism with blood tests and ultrasound tests. How is this treated? Hypothyroidism is treated with medicine that replaces the hormones that your body does not make. After you begin treatment, it may take several weeks for symptoms to go away. Follow these instructions at home:  Take medicines only as directed by your health care provider.  If you start taking any new medicines, tell your health care provider.  Keep all follow-up visits as directed by your health care provider. This is important. As your condition improves, your dosage needs may change. You will need to have blood tests regularly so that your health care provider can watch your condition. Contact a health care provider if:  Your symptoms do not get better with treatment.  You are taking thyroid  replacement medicine and: ? You sweat excessively. ? You have tremors. ? You feel anxious. ? You lose weight rapidly. ? You cannot tolerate heat. ? You have emotional swings. ? You have diarrhea. ? You feel weak. Get help right away if:  You develop chest pain.  You develop an irregular heartbeat.  You develop a rapid heartbeat. This information is not intended to replace advice given to you by your health care provider. Make sure you discuss any questions you have with your health care provider. Document Released: 04/20/2005 Document Revised: 09/26/2015 Document Reviewed: 09/05/2013 Elsevier Interactive Patient Education  2018 Elsevier Inc.  

## 2018-02-21 NOTE — Progress Notes (Signed)
Subjective:  Patient ID: Holly Mcmillan, female    DOB: 11/29/1970  Age: 47 y.o. MRN: [id number]  CC: Hypertension; Hypothyroidism; and Allergic Rhinitis    HPI Holly Mcmillan presents for f/up - She complains of fatigue, weight gain, irritability, anhedonia, and anxiety.  She has upcoming orthopedic surgery for total hip replacement.  She struggles with chronic pain.  Outpatient Medications Prior to Visit  Medication Sig Dispense Refill  . buPROPion (WELLBUTRIN XL) 300 MG 24 hr tablet Take 1 tablet (300 mg total) by mouth daily. 90 tablet 1  . Dermatological Products, Misc. [facility]) lotion Apply 1 Act topically 2 (two) times daily with a meal. 90 g 0  . gabapentin (NEURONTIN) 300 MG capsule nightly 30 capsule 3  . levothyroxine (SYNTHROID, LEVOTHROID) 175 MCG tablet TAKE 1 TABLET (175 MCG TOTAL) BY MOUTH DAILY BEFORE BREAKFAST. 30 tablet 2  . LO LOESTRIN FE 1 MG-10 MCG / 10 MCG tablet Take 1 tablet by mouth daily.  6  . telmisartan-hydrochlorothiazide (MICARDIS HCT) 40-12.5 MG tablet TAKE 1 TABLET BY MOUTH EVERY DAY 30 tablet 5  . Vitamin D, Ergocalciferol, (DRISDOL) 50000 units CAPS capsule TAKE 1 CAPSULE (50,000 UNITS TOTAL) BY MOUTH EVERY 7 (SEVEN) DAYS. 4 capsule 2  . DULoxetine (CYMBALTA) 30 MG capsule Take 1 capsule (30 mg total) by mouth daily. 30 capsule 0  . DUEXIS 800-26.6 MG TABS TAKE ONE TABLET BY MOUTH THREE TIMES DAILY AS NEEDED 90 tablet 2   No facility-administered medications prior to visit.     ROS Review of Systems  Constitutional: Positive for fatigue and unexpected weight change. Negative for appetite change, chills and diaphoresis.  HENT: Positive for congestion, postnasal drip and rhinorrhea. Negative for facial swelling, sinus pressure and sore throat.   Eyes: Negative for visual disturbance.  Respiratory: Negative for cough, chest tightness, shortness of breath and wheezing.   Cardiovascular: Negative for chest pain, palpitations and leg swelling.    Gastrointestinal: Negative for abdominal pain, constipation, diarrhea, nausea and vomiting.  Endocrine: Negative.  Negative for cold intolerance and heat intolerance.  Genitourinary: Negative.  Negative for difficulty urinating and dysuria.  Musculoskeletal: Positive for arthralgias. Negative for back pain, myalgias and neck pain.  Skin: Negative.  Negative for color change and pallor.  Neurological: Negative.  Negative for dizziness, weakness and light-headedness.  Hematological: Negative for adenopathy. Does not bruise/bleed easily.  Psychiatric/Behavioral: Negative.     Objective:  BP 122/80 (BP Location: Left Arm, Patient Position: Sitting, Cuff Size: Large)   Pulse 73   Temp 98.1 F (36.7 C) (Oral)   Resp 16   Ht 5\' 2"  (1.575 m)   Wt 209 lb 8 oz (95 kg)   SpO2 99%   BMI 38.32 kg/m   BP Readings from Last 3 Encounters:  02/21/18 122/80  01/05/18 140/78  12/16/17 (!) 152/89    Wt Readings from Last 3 Encounters:  02/21/18 209 lb 8 oz (95 kg)  01/05/18 207 lb (93.9 kg)  11/17/17 204 lb (92.5 kg)    Physical Exam  Constitutional: She is oriented to person, place, and time. No distress.  HENT:  Nose: Mucosal edema and rhinorrhea present. Right sinus exhibits no maxillary sinus tenderness and no frontal sinus tenderness. Left sinus exhibits no maxillary sinus tenderness and no frontal sinus tenderness.  Mouth/Throat: Oropharynx is clear and moist. No oropharyngeal exudate.  Eyes: Conjunctivae are normal. No scleral icterus.  Neck: Normal range of motion. Neck supple. No JVD present. No thyromegaly present.  Cardiovascular: Normal rate, regular rhythm and normal heart sounds. Exam reveals no gallop.  No murmur heard. Pulmonary/Chest: Effort normal and breath sounds normal. No respiratory distress. She has no wheezes. She has no rales.  Abdominal: Soft. Bowel sounds are normal. She exhibits no mass. There is no hepatosplenomegaly. There is no tenderness.  Musculoskeletal:  Normal range of motion. She exhibits no edema, tenderness or deformity.  Lymphadenopathy:    She has no cervical adenopathy.  Neurological: She is alert and oriented to person, place, and time.  Skin: Skin is warm and dry. She is not diaphoretic. No pallor.  Vitals reviewed.   Lab Results  Component Value Date   WBC 12.6 (H) 02/21/2018   HGB 14.4 02/21/2018   HCT 43.0 02/21/2018   PLT 384.0 02/21/2018   GLUCOSE 83 02/21/2018   CHOL 252 (H) 05/26/2017   TRIG 189.0 (H) 05/26/2017   HDL 57.10 05/26/2017   LDLDIRECT 164.9 01/15/2014   LDLCALC 157 (H) 05/26/2017   ALT 31 05/26/2017   AST 20 05/26/2017   NA 141 02/21/2018   K 4.3 02/21/2018   CL 102 02/21/2018   CREATININE 0.89 02/21/2018   BUN 14 02/21/2018   CO2 28 02/21/2018   TSH 0.67 02/21/2018   HGBA1C 5.5 03/16/2016    Dg Epidural/nerve Root  Result Date: 12/16/2017 CLINICAL DATA:  Right lower extremity radiculopathy. Displacement of the L2-3 nerve root. The patient reports 70% relief of pain after the previous epidural steroid injection. FLUOROSCOPY TIME:  Radiation Exposure Index (as provided by the fluoroscopic device): 22.68 uGy*m2 Fluoroscopy Time:  19 seconds Number of Acquired Images:  0 PROCEDURE: SELECTIVE NERVE ROOT AND TRANSFORAMINAL EPIDURAL STEROID INJECTION: A transforaminal approach was performed on the right at the L2-3 foramen. The overlying skin was cleansed and anesthetized. A 22 gauge spinal needle was advanced into the foramen from a lateral oblique approach. Injection of 2cc of Omnipaque 180 outlined the nerve root and extended into the epidural space. No vascular or intrathecal spread is evident. I then injected 120 mg of Depo-Medrol and 1.5 ml of 1% lidocaine. The patient tolerated the procedure without evidence for complication. The patient was observed for 20 minutes prior to discharge in stable neurologic condition. IMPRESSION: Technically successful selective nerve root block and transforaminal epidural  steroid injection on theright at the L2-3 foramen. Electronically Signed   By: San Morelle M.D.   On: 12/16/2017 08:28    Assessment & Plan:   Holly Mcmillan was seen today for hypertension, hypothyroidism and allergic rhinitis .  Diagnoses and all orders for this visit:  Essential hypertension- Her BP is well controlled.  Electrolytes and renal function are normal. -     Basic metabolic panel; Future -     CBC with Differential/Platelet; Future  Need for influenza vaccination -     Flu Vaccine QUAD 36+ mos IM  Acquired hypothyroidism- Her TSH is in the normal range.  I have asked her to stay on the current dose of levothyroxine. -     TSH; Future  Seasonal allergic rhinitis due to pollen -     fluticasone (FLONASE) 50 MCG/ACT nasal spray; Place 2 sprays into both nostrils daily.  Depression, major, in remission ([place])- I think she would benefit from a higher dose of duloxetine. -     DULoxetine (CYMBALTA) 60 MG capsule; Take 1 capsule (60 mg total) by mouth daily.  Need for Tdap vaccination -     Tdap vaccine greater than or equal to 7yo IM  I have discontinued Holly Mcmillan. Jentz's DUEXIS and DULoxetine. I am also having her start on DULoxetine and fluticasone. Additionally, I am having her maintain her EPICERAM, buPROPion, levothyroxine, telmisartan-hydrochlorothiazide, gabapentin, Vitamin D (Ergocalciferol), and LO LOESTRIN FE.  Meds ordered this encounter  Medications  . DULoxetine (CYMBALTA) 60 MG capsule    Sig: Take 1 capsule (60 mg total) by mouth daily.    Dispense:  90 capsule    Refill:  1  . fluticasone (FLONASE) 50 MCG/ACT nasal spray    Sig: Place 2 sprays into both nostrils daily.    Dispense:  48 g    Refill:  1     Follow-up: Return in about 6 months (around 08/23/2018).  Scarlette Calico, MD

## 2018-02-23 ENCOUNTER — Other Ambulatory Visit: Payer: Self-pay | Admitting: Internal Medicine

## 2018-02-23 DIAGNOSIS — F325 Major depressive disorder, single episode, in full remission: Secondary | ICD-10-CM

## 2018-02-26 ENCOUNTER — Other Ambulatory Visit: Payer: Self-pay | Admitting: Internal Medicine

## 2018-02-26 DIAGNOSIS — E039 Hypothyroidism, unspecified: Secondary | ICD-10-CM

## 2018-03-02 ENCOUNTER — Encounter: Payer: Self-pay | Admitting: Internal Medicine

## 2018-03-07 ENCOUNTER — Telehealth: Payer: Self-pay | Admitting: Internal Medicine

## 2018-03-07 NOTE — Telephone Encounter (Signed)
Copied from [place] [id number]. Topic: Quick Communication - See Telephone Encounter >> Mar 07, 2018  3:26 PM Blase Mess A wrote: CRM for notification. See Telephone encounter for: 03/07/18. Patient is calling because she feels that a fever blister is coming on.  Patient is requesting a script for meds that Dr. Ronnald Ramp has prescribed previously for her for fever blisters. Patient does not know the name of the medicine. Please advise [phone]

## 2018-03-28 ENCOUNTER — Emergency Department (HOSPITAL_COMMUNITY): Payer: 59

## 2018-03-28 ENCOUNTER — Emergency Department (HOSPITAL_COMMUNITY)
Admission: EM | Admit: 2018-03-28 | Discharge: 2018-03-28 | Disposition: A | Discharge status: Home / Self Care | Payer: 59 | Attending: Emergency Medicine | Admitting: Emergency Medicine

## 2018-03-28 ENCOUNTER — Other Ambulatory Visit: Payer: Self-pay

## 2018-03-28 ENCOUNTER — Encounter (HOSPITAL_COMMUNITY): Payer: Self-pay | Admitting: Emergency Medicine

## 2018-03-28 DIAGNOSIS — R1033 Periumbilical pain: Secondary | ICD-10-CM

## 2018-03-28 DIAGNOSIS — Z79899 Other long term (current) drug therapy: Secondary | ICD-10-CM | POA: Insufficient documentation

## 2018-03-28 DIAGNOSIS — I10 Essential (primary) hypertension: Secondary | ICD-10-CM | POA: Diagnosis not present

## 2018-03-28 DIAGNOSIS — R110 Nausea: Secondary | ICD-10-CM

## 2018-03-28 DIAGNOSIS — E039 Hypothyroidism, unspecified: Secondary | ICD-10-CM | POA: Diagnosis not present

## 2018-03-28 DIAGNOSIS — Z87891 Personal history of nicotine dependence: Secondary | ICD-10-CM | POA: Insufficient documentation

## 2018-03-28 DIAGNOSIS — R630 Anorexia: Secondary | ICD-10-CM | POA: Insufficient documentation

## 2018-03-28 LAB — URINALYSIS, ROUTINE W REFLEX MICROSCOPIC
BILIRUBIN URINE: NEGATIVE
Glucose, UA: NEGATIVE mg/dL
KETONES UR: NEGATIVE mg/dL
LEUKOCYTES UA: NEGATIVE
Nitrite: NEGATIVE
PROTEIN: NEGATIVE mg/dL
Specific Gravity, Urine: 1.01 (ref 1.005–1.030)
pH: 6 (ref 5.0–8.0)

## 2018-03-28 LAB — CBC WITH DIFFERENTIAL/PLATELET
ABS IMMATURE GRANULOCYTES: 0.16 10*3/uL — AB (ref 0.00–0.07)
BASOS PCT: 0 %
Basophils Absolute: 0.1 10*3/uL (ref 0.0–0.1)
EOS PCT: 0 %
Eosinophils Absolute: 0 10*3/uL (ref 0.0–0.5)
HCT: 43.3 % (ref 36.0–46.0)
Hemoglobin: 13.3 g/dL (ref 12.0–15.0)
Immature Granulocytes: 1 %
LYMPHS PCT: 20 %
Lymphs Abs: 3.1 10*3/uL (ref 0.7–4.0)
MCH: 30.1 pg (ref 26.0–34.0)
MCHC: 30.7 g/dL (ref 30.0–36.0)
MCV: 98 fL (ref 80.0–100.0)
MONO ABS: 1.1 10*3/uL — AB (ref 0.1–1.0)
Monocytes Relative: 7 %
NEUTROS ABS: 11.1 10*3/uL — AB (ref 1.7–7.7)
Neutrophils Relative %: 72 %
Platelets: 370 10*3/uL (ref 150–400)
RBC: 4.42 MIL/uL (ref 3.87–5.11)
RDW: 12.6 % (ref 11.5–15.5)
WBC: 15.5 10*3/uL — ABNORMAL HIGH (ref 4.0–10.5)
nRBC: 0 % (ref 0.0–0.2)

## 2018-03-28 LAB — COMPREHENSIVE METABOLIC PANEL
ALT: 19 U/L (ref 0–44)
AST: 19 U/L (ref 15–41)
Albumin: 3.5 g/dL (ref 3.5–5.0)
Alkaline Phosphatase: 47 U/L (ref 38–126)
Anion gap: 10 (ref 5–15)
BUN: 9 mg/dL (ref 6–20)
CHLORIDE: 105 mmol/L (ref 98–111)
CO2: 23 mmol/L (ref 22–32)
CREATININE: 0.88 mg/dL (ref 0.44–1.00)
Calcium: 7.8 mg/dL — ABNORMAL LOW (ref 8.9–10.3)
GFR calc Af Amer: >60 mL/min (ref >60)
GFR calc non Af Amer: >60 mL/min (ref >60)
Glucose, Bld: 89 mg/dL (ref 70–99)
Potassium: 3 mmol/L — ABNORMAL LOW (ref 3.5–5.1)
SODIUM: 138 mmol/L (ref 135–145)
Total Bilirubin: 0.4 mg/dL (ref 0.3–1.2)
Total Protein: 6.6 g/dL (ref 6.5–8.1)

## 2018-03-28 LAB — LIPASE, BLOOD: Lipase: 27 U/L (ref 11–51)

## 2018-03-28 MED ORDER — MORPHINE SULFATE (PF) 4 MG/ML IV SOLN
4.0000 mg | Freq: Once | INTRAVENOUS | Status: AC
  Administered 2018-03-28: 4 mg via INTRAVENOUS
  Filled 2018-03-28: qty 1

## 2018-03-28 MED ORDER — DICYCLOMINE HCL 10 MG PO CAPS
10.0000 mg | ORAL_CAPSULE | Freq: Once | ORAL | Status: AC
  Administered 2018-03-28: 10 mg via ORAL
  Filled 2018-03-28: qty 1

## 2018-03-28 MED ORDER — POTASSIUM CHLORIDE ER 10 MEQ PO TBCR: 30.0000 meq | EXTENDED_RELEASE_TABLET | Freq: Every day | ORAL | 0 refills | Status: DC

## 2018-03-28 MED ORDER — DICYCLOMINE HCL 20 MG PO TABS: 20.0000 mg | ORAL_TABLET | Freq: Two times a day (BID) | ORAL | 0 refills | Status: DC

## 2018-03-28 MED ORDER — ONDANSETRON HCL 4 MG PO TABS: 4.0000 mg | ORAL_TABLET | Freq: Four times a day (QID) | ORAL | 0 refills | Status: DC

## 2018-03-28 MED ORDER — POTASSIUM CHLORIDE CRYS ER 20 MEQ PO TBCR
40.0000 meq | EXTENDED_RELEASE_TABLET | Freq: Once | ORAL | Status: AC
  Administered 2018-03-28: 40 meq via ORAL
  Filled 2018-03-28: qty 2

## 2018-03-28 MED ORDER — ONDANSETRON HCL 4 MG/2ML IJ SOLN
4.0000 mg | Freq: Once | INTRAMUSCULAR | Status: AC
  Administered 2018-03-28: 4 mg via INTRAVENOUS
  Filled 2018-03-28: qty 2

## 2018-03-28 MED ORDER — SODIUM CHLORIDE 0.9 % IV BOLUS
1000.0000 mL | Freq: Once | INTRAVENOUS | Status: AC
  Administered 2018-03-28: 1000 mL via INTRAVENOUS

## 2018-03-28 MED ORDER — IOHEXOL 300 MG/ML  SOLN
100.0000 mL | Freq: Once | INTRAMUSCULAR | Status: AC | PRN
  Administered 2018-03-28: 100 mL via INTRAVENOUS

## 2018-03-28 NOTE — ED Notes (Signed)
Patient transported to CT 

## 2018-03-28 NOTE — Discharge Instructions (Signed)
Please read and follow all provided instructions You have been seen today for your complaint of: abdominal pain and nausea Your lab work: Was reassuring. This showed low potassium. Pleas take prescribed potassium over the next several days.  Your imaging: Did not show any intra-abdominal processes.  Vital signs: See below  Abdominal Pain  Your exam might not show the exact reason you have abdominal pain. Since there are many different causes of abdominal pain, another checkup and more tests may be needed. It is very important to follow up for lasting (persistent) or worsening symptoms. A possible cause of abdominal pain in any person who still has his or her appendix is acute appendicitis. Appendicitis is often hard to diagnose. Normal blood tests, urine tests, ultrasound, and CT scans do not completely rule out early appendicitis or other causes of abdominal pain. Sometimes, only the changes that happen over time will allow appendicitis and other causes of abdominal pain to be determined. Other potential problems that may require surgery may also take time to become more apparent. Because of this, it is important that you follow all of the instructions below.   HOME CARE INSTRUCTIONS  Do not take laxatives unless directed by your caregiver. Rest as much as possible.  Do not eat solid food until your pain is gone: A diet of water, weak decaffeinated tea, broth or bouillon, gelatin, oral rehydration solutions (ORS), frozen ice pops, or ice chips may be helpful.  When pain is gone: Start a light diet (dry toast, crackers, applesauce, or white rice). Increase the diet slowly as long as it does not bother you. Eat no dairy products (including cheese and eggs) and no spicy, fatty, fried, or high-fiber foods.  Use no alcohol, caffeine, or cigarettes.  Take your regular medicines unless your caregiver told you not to.  Take any prescribed medicine as directed.   SEEK IMMEDIATE MEDICAL CARE IF:  The pain  does not go away.  You have a fever >101 that does not go down with medication. You keep throwing up (vomiting) or cannot drink liquids.  You have shaking chills (rigors) The pain becomes localized (Pain in the right side could possibly be appendicitis. In an adult, pain in the left lower portion of the abdomen could be colitis or diverticulitis). You pass bloody or black tarry stools.  There is bright red blood in the stool.  There is blood in your vomit. Your bowel movements stop (become blocked) or you cannot pass gas.  The constipation stays for more than 4 days.  You have bloody, frequent, or painful urination.  You have yellow discoloration in the skin or whites of the eyes.  Your stomach becomes bloated or bigger.  You have dizziness or fainting.  You have chest or back pain. You have rectal pain.  You do not seem to be getting better.  You have any questions or concerns.   Your vital signs today were: BP 114/66    Pulse 78    Temp 98.2 F (36.8 C) (Oral)    Resp 18    SpO2 100%  If your blood pressure (bp) was elevated above 135/85 this visit, please have this repeated by your doctor within one month.

## 2018-03-28 NOTE — ED Triage Notes (Signed)
Pt reports nausea starting yesterday. States that she took pepto last night but has still felt nauseated all day. Sent by doctor for further evaluation.

## 2018-03-28 NOTE — ED Provider Notes (Signed)
[facility] EMERGENCY DEPARTMENT Provider Note   CSN: [id number] Arrival date & time: 03/28/18  1928     History   Chief Complaint Chief Complaint  Patient presents with  . Nausea    HPI Holly Mcmillan is a 47 y.o. female with a history of hypertension, hyperlipidemia, hypothyroidism, obesity, tobacco abuse who presents emergency department today for abdominal pain and nausea.  Patient was seen at [facility] walk-in clinic and sent over for concerns for appendicitis.  Patient reports 1 week ago she began having nausea.  She reports this persisted throughout the entire week she just felt off.  She notes that yesterday evening she began having periumbilical abdominal pain that is since radiated to the right lower quadrant.  She notes that she has a decreased appetite associated with this.  She denies history of similar pain in the past.  Patient denies any fever, chills, upper abdominal pain, flank pain, urinary symptoms, diarrhea, vomiting.  Patient reports last bowel movement was last night and normal.  She still passing gas.  She denies any new sexual partners.  She is here with her husband who is supportive.  She denies any vaginal bleeding or discharge.  Patient has had prior cholecystectomy.  She is on blood thinners.  Last p.o. was at lunch around 12 PM. She has had a colonoscopy in the past that she said was normal.   HPI  Past Medical History:  Diagnosis Date  . Depression   . Dysmenorrhea   . Gallbladder disease   . Headache(784.0)   . Hypertension   . Hypothyroidism   . Obesity   . Ovarian cyst     Patient Active Problem List   Diagnosis Date Noted  . Arthritis of right hip 11/17/2017  . Lumbar radiculopathy, right 07/06/2017  . Eczema of face 05/26/2017  . Piriformis syndrome of right side 08/17/2016  . Routine general medical examination at a health care facility 03/16/2016  . RLS (restless legs syndrome) 03/16/2016  . Hypertriglyceridemia, essential  04/04/2015  . Hyperlipidemia with target LDL less than 100 04/04/2015  . Severe obesity (BMI >= 40) ([place]) 09/20/2013  . Hyperglycemia 09/30/2011  . Hypothyroidism 07/09/2010  . Allergic rhinitis due to pollen 06/30/2010  . TOBACCO USE 11/28/2009  . Depression, major, in remission ([place]) 11/28/2009  . Essential hypertension 11/28/2009    Past Surgical History:  Procedure Laterality Date  . ablation    . BREAST CYST EXCISION Right 06/24/2016   Procedure: EXCISION RIGHT BREAST CYST;  Surgeon: Coralie Keens, MD;  Location: [facility];  Service: General;  Laterality: Right;  . CESAREAN SECTION  2002, 2009  . CHOLECYSTECTOMY  2005  . NASAL SINUS SURGERY    . THYROID SURGERY  2012     OB History   None      Home Medications    Prior to Admission medications   Medication Sig Start Date End Date Taking? Authorizing Provider  buPROPion (WELLBUTRIN XL) 300 MG 24 hr tablet Take 1 tablet (300 mg total) by mouth daily. 05/28/17  Yes [name], MD  DULoxetine (CYMBALTA) 60 MG capsule Take 1 capsule (60 mg total) by mouth daily. 02/21/18  Yes [name], MD  fluticasone (FLONASE) 50 MCG/ACT nasal spray Place 2 sprays into both nostrils daily. 02/21/18  Yes [name], MD  gabapentin (NEURONTIN) 300 MG capsule nightly Patient taking differently: Take 300 mg by mouth at bedtime.  01/05/18  Yes Lyndal Pulley, DO  levothyroxine (SYNTHROID, LEVOTHROID) 175 MCG tablet TAKE 1 TABLET (175 MCG TOTAL) BY MOUTH DAILY BEFORE BREAKFAST. 02/27/18  Yes [name], MD  LO LOESTRIN FE 1 MG-10 MCG / 10 MCG tablet Take 1 tablet by mouth daily. 01/29/18  Yes [provider]  telmisartan-hydrochlorothiazide (MICARDIS HCT) 40-12.5 MG tablet TAKE 1 TABLET BY MOUTH EVERY DAY Patient taking differently: Take 1 tablet by mouth daily.  11/24/17  Yes [name], MD  Vitamin D, Ergocalciferol, (DRISDOL) 50000 units CAPS capsule TAKE 1 CAPSULE (50,000 UNITS TOTAL) BY MOUTH  EVERY 7 (SEVEN) DAYS. 01/26/18  Yes Lyndal Pulley, DO  Dermatological Products, Misc. [facility]) lotion Apply 1 Act topically 2 (two) times daily with a meal. Patient not taking: Reported on 03/28/2018 05/26/17   [name], MD    Family History Family History  Problem Relation Age of Onset  . Alcohol abuse Paternal Uncle        x2  . Arthritis Father   . Hypertension Father   . Cancer Other   . Heart disease Mother        CHF, A Fib  . Hypertension Mother   . Cervical cancer Mother   . Leukemia Mother   . Colon cancer Maternal Aunt   . Alcohol abuse Maternal Uncle        x2  . Heart disease Maternal Grandfather   . Pancreatic cancer Maternal Aunt   . Lung cancer Maternal Uncle     Social History Social History   Tobacco Use  . Smoking status: Former Smoker    Packs/day: 0.30    Years: 20.00    Pack years: 6.00    Types: Cigarettes  . Smokeless tobacco: Never Used  Substance Use Topics  . Alcohol use: No  . Drug use: No     Allergies   Spironolactone and Sulfonamide derivatives   Review of Systems Review of Systems  All other systems reviewed and are negative.    Physical Exam Updated Vital Signs BP 114/66   Pulse 78   Temp 98.2 F (36.8 C) (Oral)   Resp 18   SpO2 100%   Physical Exam  Constitutional: She appears well-developed and well-nourished.  HENT:  Head: Normocephalic and atraumatic.  Right Ear: External ear normal.  Left Ear: External ear normal.  Nose: Nose normal.  Mouth/Throat: Uvula is midline, oropharynx is clear and moist and mucous membranes are normal. No tonsillar exudate.  Eyes: Pupils are equal, round, and reactive to light. Right eye exhibits no discharge. Left eye exhibits no discharge. No scleral icterus.  Neck: Trachea normal. Neck supple. No spinous process tenderness present. No neck rigidity. Normal range of motion present.  Cardiovascular: Normal rate, regular rhythm and intact distal pulses.  No murmur  heard. Pulses:      Radial pulses are 2+ on the right side, and 2+ on the left side.       Dorsalis pedis pulses are 2+ on the right side, and 2+ on the left side.       Posterior tibial pulses are 2+ on the right side, and 2+ on the left side.  No lower extremity swelling or edema. Calves symmetric in size bilaterally.  Pulmonary/Chest: Effort normal and breath sounds normal. She exhibits no tenderness.  Abdominal: Soft. Bowel sounds are normal. She exhibits no distension. There is tenderness in the right lower quadrant, suprapubic area and left lower quadrant. There is tenderness at McBurney's point. There is no rigidity, no rebound, no  guarding, no CVA tenderness and negative Murphy's sign.  Musculoskeletal: She exhibits no edema.  Lymphadenopathy:    She has no cervical adenopathy.  Neurological: She is alert.  Skin: Skin is warm and dry. No rash noted. She is not diaphoretic.  Psychiatric: She has a normal mood and affect.  Nursing note and vitals reviewed.    ED Treatments / Results  Labs (all labs ordered are listed, but only abnormal results are displayed) Labs Reviewed  COMPREHENSIVE METABOLIC PANEL - Abnormal; Notable for the following components:      Result Value   Potassium 3.0 (*)    Calcium 7.8 (*)    All other components within normal limits  CBC WITH DIFFERENTIAL/PLATELET - Abnormal; Notable for the following components:   WBC 15.5 (*)    Neutro Abs 11.1 (*)    Monocytes Absolute 1.1 (*)    Abs Immature Granulocytes 0.16 (*)    All other components within normal limits  URINALYSIS, ROUTINE W REFLEX MICROSCOPIC - Abnormal; Notable for the following components:   APPearance HAZY (*)    Hgb urine dipstick SMALL (*)    Bacteria, UA RARE (*)    All other components within normal limits  LIPASE, BLOOD    EKG None  Radiology Ct Abdomen Pelvis W Contrast  Result Date: 03/28/2018 CLINICAL DATA:  Abdominal pain and nausea for 1 week. EXAM: CT ABDOMEN AND PELVIS  WITH CONTRAST TECHNIQUE: Multidetector CT imaging of the abdomen and pelvis was performed using the standard protocol following bolus administration of intravenous contrast. CONTRAST:  161mL OMNIPAQUE IOHEXOL 300 MG/ML  SOLN COMPARISON:  08/28/2015 FINDINGS: Lower chest: No acute abnormality. Hepatobiliary: No focal liver abnormality is seen. Status post cholecystectomy. No biliary dilatation. Pancreas: Unremarkable. No pancreatic ductal dilatation or surrounding inflammatory changes. Spleen: Normal in size without focal abnormality. Adrenals/Urinary Tract: Adrenal glands are unremarkable. Kidneys are normal, without renal calculi, focal lesion, or hydronephrosis. Bladder is unremarkable. Stomach/Bowel: Stomach is within normal limits. Appendix appears normal. No evidence of bowel wall thickening, distention, or inflammatory changes. Vascular/Lymphatic: No significant vascular findings are present. No enlarged abdominal or pelvic lymph nodes. Reproductive: Uterus and bilateral adnexa are unremarkable. Other: Stable laxity of the lower anterior abdominal wall. 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 IMPRESSION: No evidence of acute abnormalities within the abdomen or pelvis. L2-L3 spondylosis with narrowing of the right osseous neural foramen. Electronically Signed   By: Fidela Salisbury M.D.   On: 03/28/2018 22:43    Procedures Procedures (including critical care time)  Medications Ordered in ED Medications  sodium chloride 0.9 % bolus 1,000 mL (0 mLs Intravenous Stopped 03/28/18 2300)  ondansetron (ZOFRAN) injection 4 mg (4 mg Intravenous Given 03/28/18 2143)  morphine 4 MG/ML injection 4 mg (4 mg Intravenous Given 03/28/18 2143)  iohexol (OMNIPAQUE) 300 MG/ML solution 100 mL (100 mLs Intravenous Contrast Given 03/28/18 2221)  dicyclomine (BENTYL) capsule 10 mg (10 mg Oral Given 03/28/18 2314)  potassium chloride SA (K-DUR,KLOR-CON) CR tablet 40 mEq (40  mEq Oral Given 03/28/18 2314)     Initial Impression / Assessment and Plan / ED Course  I have reviewed the triage vital signs and the nursing notes.  Pertinent labs & imaging results that were available during my care of the patient were reviewed by me and considered in my medical decision making (see chart for details).     47 year old female with 1 week history of nausea with 1 day history of periumbilical abdominal pain that  is since radiated to her right lower quadrant.  She was seen in urgent care and sent over for rule out appendicitis.  Patient denies any fever or chills at home.  She is without emesis.  She does note some anorexia.  She notes she has had a colonoscopy in the past that did not show diverticular disease.  She denies any pelvic pain, vaginal discharge or vaginal bleeding.  She denies any new sexual partners.  Vital signs are reassuring on presentation.  Patient is without any fever, tachycardia, tachypnea, hypoxia or hypotension.  Labs reviewed and significant for a leukocytosis as well as hypokalemia.  CT scan obtained and without any evidence of appendicitis or other acute intra-abdominal processes.  This was reviewed by myself.  Repeat abdominal exam with out any peritoneal signs.  Patient is without a surgical abdomen.  She is without any intractable vomiting in the department. No further workup indicated. Will tx conservatively. I advised the patient to follow-up with pcp this week. Specific return precautions discussed. Time was given for all questions to be answered. The patient verbalized understanding and agreement with plan. The patient appears safe for discharge home.  Final Clinical Impressions(s) / ED Diagnoses   Final diagnoses:  Periumbilical abdominal pain  Nausea    ED Discharge Orders         Ordered    ondansetron (ZOFRAN) 4 MG tablet  Every 6 hours     03/28/18 2258    dicyclomine (BENTYL) 20 MG tablet  2 times daily     03/28/18 2258    potassium  chloride (K-DUR) 10 MEQ tablet  Daily     03/28/18 2258           Lorelle Gibbs 03/28/18 2324    Margette Fast, MD 03/29/18 1005

## 2018-03-31 ENCOUNTER — Other Ambulatory Visit: Payer: Self-pay | Admitting: Internal Medicine

## 2018-03-31 DIAGNOSIS — F325 Major depressive disorder, single episode, in full remission: Secondary | ICD-10-CM

## 2018-04-12 ENCOUNTER — Other Ambulatory Visit: Payer: Self-pay | Admitting: Internal Medicine

## 2018-04-12 DIAGNOSIS — B001 Herpesviral vesicular dermatitis: Secondary | ICD-10-CM

## 2018-04-16 ENCOUNTER — Other Ambulatory Visit: Payer: Self-pay | Admitting: Family Medicine

## 2018-04-26 ENCOUNTER — Other Ambulatory Visit (HOSPITAL_COMMUNITY): Payer: Self-pay | Admitting: Orthopaedic Surgery

## 2018-04-26 ENCOUNTER — Ambulatory Visit (HOSPITAL_COMMUNITY)
Admission: RE | Admit: 2018-04-26 | Discharge: 2018-04-26 | Disposition: A | Discharge status: Home / Self Care | Payer: 59 | Source: Ambulatory Visit | Attending: Orthopedic Surgery | Admitting: Orthopedic Surgery

## 2018-04-26 DIAGNOSIS — M7989 Other specified soft tissue disorders: Principal | ICD-10-CM

## 2018-04-26 DIAGNOSIS — M79604 Pain in right leg: Secondary | ICD-10-CM | POA: Diagnosis present

## 2018-04-26 NOTE — Progress Notes (Signed)
*  Preliminary Results* Right lower extremity venous duplex completed. Right lower extremity is negative for deep vein thrombosis. There is no evidence of right Baker's cyst.  04/26/2018 11:10 AM  Maudry Mayhew, MHA, RVT, RDCS, RDMS

## 2018-05-21 ENCOUNTER — Other Ambulatory Visit: Payer: Self-pay | Admitting: Internal Medicine

## 2018-05-21 ENCOUNTER — Other Ambulatory Visit: Payer: Self-pay | Admitting: Family Medicine

## 2018-05-21 DIAGNOSIS — I10 Essential (primary) hypertension: Secondary | ICD-10-CM

## 2018-07-06 ENCOUNTER — Other Ambulatory Visit: Payer: Self-pay | Admitting: Family Medicine

## 2018-07-10 ENCOUNTER — Other Ambulatory Visit: Payer: Self-pay | Admitting: Internal Medicine

## 2018-07-10 DIAGNOSIS — F325 Major depressive disorder, single episode, in full remission: Secondary | ICD-10-CM

## 2018-07-17 ENCOUNTER — Other Ambulatory Visit: Payer: Self-pay | Admitting: Internal Medicine

## 2018-07-17 DIAGNOSIS — F325 Major depressive disorder, single episode, in full remission: Secondary | ICD-10-CM

## 2018-08-17 ENCOUNTER — Other Ambulatory Visit: Payer: Self-pay | Admitting: Internal Medicine

## 2018-08-17 DIAGNOSIS — E039 Hypothyroidism, unspecified: Secondary | ICD-10-CM

## 2018-09-05 ENCOUNTER — Other Ambulatory Visit: Payer: Self-pay | Admitting: Internal Medicine

## 2018-09-05 DIAGNOSIS — F325 Major depressive disorder, single episode, in full remission: Secondary | ICD-10-CM

## 2018-09-27 ENCOUNTER — Other Ambulatory Visit: Payer: Self-pay | Admitting: Family Medicine

## 2018-10-02 ENCOUNTER — Other Ambulatory Visit: Payer: Self-pay | Admitting: Internal Medicine

## 2018-10-02 DIAGNOSIS — I10 Essential (primary) hypertension: Secondary | ICD-10-CM

## 2018-10-24 ENCOUNTER — Ambulatory Visit (INDEPENDENT_AMBULATORY_CARE_PROVIDER_SITE_OTHER): Payer: 59 | Admitting: Internal Medicine

## 2018-10-24 DIAGNOSIS — E785 Hyperlipidemia, unspecified: Secondary | ICD-10-CM | POA: Diagnosis not present

## 2018-10-24 DIAGNOSIS — F325 Major depressive disorder, single episode, in full remission: Secondary | ICD-10-CM

## 2018-10-24 DIAGNOSIS — I10 Essential (primary) hypertension: Secondary | ICD-10-CM | POA: Diagnosis not present

## 2018-10-24 DIAGNOSIS — E781 Pure hyperglyceridemia: Secondary | ICD-10-CM

## 2018-10-24 DIAGNOSIS — E039 Hypothyroidism, unspecified: Secondary | ICD-10-CM | POA: Diagnosis not present

## 2018-10-24 MED ORDER — VIIBRYD STARTER PACK 10 & 20 MG PO KIT: 1.0000 | PACK | Freq: Every day | ORAL | 0 refills | Status: DC

## 2018-10-24 MED ORDER — DULOXETINE HCL 30 MG PO CPEP: ORAL_CAPSULE | ORAL | 0 refills | Status: DC

## 2018-10-24 NOTE — Progress Notes (Signed)
Virtual Visit via Video Note  I connected with Holly Mcmillan on 10/26/18 at 10:30 AM EDT by a video enabled telemedicine application and verified that I am speaking with the correct person using two identifiers.   I discussed the limitations of evaluation and management by telemedicine and the availability of in person appointments. The patient expressed understanding and agreed to proceed.  History of Present Illness: She checked in for virtual visit.  She was not willing to come in for an in person visit due to the COVID-19 pandemic.  She complains of a several month history of weight gain, fatigue, insomnia, anger, irritability, crying spells, and anxiety.  She tells me she is taking bupropion at 300 mg a day and duloxetine at 60 mg a day.  She tells me her bowel movements are normal and she denies chest pain, shortness of breath, or edema.    Observations/Objective: Tearful, anxious, obese female.  No acute distress.  Calm and cooperative.   Lab Results  Component Value Date   WBC 11.2 (H) 10/25/2018   HGB 12.7 10/25/2018   HCT 39.1 10/25/2018   PLT 366.0 10/25/2018   GLUCOSE 81 10/25/2018   CHOL 266 (H) 10/25/2018   TRIG 315.0 (H) 10/25/2018   HDL 55.70 10/25/2018   LDLDIRECT 173.0 10/25/2018   LDLCALC 157 (H) 05/26/2017   ALT 19 10/25/2018   AST 15 10/25/2018   NA 137 10/25/2018   K 4.0 10/25/2018   CL 101 10/25/2018   CREATININE 0.68 10/25/2018   BUN 14 10/25/2018   CO2 27 10/25/2018   TSH 0.11 (L) 10/25/2018   HGBA1C 5.5 03/16/2016     Assessment and Plan: She is having a recurrence of depression anxiety and her symptoms are not work well controlled with the current combination of Wellbutrin and duloxetine.  I have asked her to stay on the current dose of Wellbutrin but to start tapering off of duloxetine over the next 1-2 months.  During that time will start Viibryd to see if it will be more effective at achieving depressive symptom resolution.  She will start the  Viibryd at 10 mg a day with food for 1 week, then 20 mg a day with food for 1 week, and then I anticipate increasing her to the 40 mg dose of Viibryd.  Her TSH is suppressed so I have asked her to lower her dose of levothyroxine.  Her triglycerides are elevated at 315 so in addition to lifestyle modifications I have asked her to start taking a fish oil supplement to reduce the risk of complications such as pancreatitis.   Follow Up Instructions: She will return in 3 months to recheck her TSH level.  She agrees to start tapering the dose of duloxetine and to start Viibryd with an increasing dose schedule as listed.  She will let me know if she develops any new or worsening symptoms.  She agrees to work on her lifestyle modifications.    I discussed the assessment and treatment plan with the patient. The patient was provided an opportunity to ask questions and all were answered. The patient agreed with the plan and demonstrated an understanding of the instructions.   The patient was advised to call back or seek an in-person evaluation if the symptoms worsen or if the condition fails to improve as anticipated.  I provided 30 minutes of non-face-to-face time during this encounter.   Scarlette Calico, MD

## 2018-10-25 ENCOUNTER — Other Ambulatory Visit (INDEPENDENT_AMBULATORY_CARE_PROVIDER_SITE_OTHER): Payer: 59

## 2018-10-25 ENCOUNTER — Encounter: Payer: Self-pay | Admitting: Internal Medicine

## 2018-10-25 DIAGNOSIS — E781 Pure hyperglyceridemia: Secondary | ICD-10-CM

## 2018-10-25 DIAGNOSIS — E039 Hypothyroidism, unspecified: Secondary | ICD-10-CM

## 2018-10-25 DIAGNOSIS — I10 Essential (primary) hypertension: Secondary | ICD-10-CM | POA: Diagnosis not present

## 2018-10-25 DIAGNOSIS — E785 Hyperlipidemia, unspecified: Secondary | ICD-10-CM

## 2018-10-25 LAB — COMPREHENSIVE METABOLIC PANEL
ALT: 19 U/L (ref 0–35)
AST: 15 U/L (ref 0–37)
Albumin: 4.1 g/dL (ref 3.5–5.2)
Alkaline Phosphatase: 75 U/L (ref 39–117)
BUN: 14 mg/dL (ref 6–23)
CO2: 27 mEq/L (ref 19–32)
Calcium: 8.9 mg/dL (ref 8.4–10.5)
Chloride: 101 mEq/L (ref 96–112)
Creatinine, Ser: 0.68 mg/dL (ref 0.40–1.20)
GFR: 91.83 mL/min (ref >60.00)
Glucose, Bld: 81 mg/dL (ref 70–99)
Potassium: 4 mEq/L (ref 3.5–5.1)
Sodium: 137 mEq/L (ref 135–145)
Total Bilirubin: 0.3 mg/dL (ref 0.2–1.2)
Total Protein: 7.1 g/dL (ref 6.0–8.3)

## 2018-10-25 LAB — CBC WITH DIFFERENTIAL/PLATELET
Basophils Absolute: 0.1 K/uL (ref 0.0–0.1)
Basophils Relative: 0.5 % (ref 0.0–3.0)
Eosinophils Absolute: 0 K/uL (ref 0.0–0.7)
Eosinophils Relative: 0.4 % (ref 0.0–5.0)
HCT: 39.1 % (ref 36.0–46.0)
Hemoglobin: 12.7 g/dL (ref 12.0–15.0)
Lymphocytes Relative: 27.1 % (ref 12.0–46.0)
Lymphs Abs: 3 K/uL (ref 0.7–4.0)
MCHC: 32.6 g/dL (ref 30.0–36.0)
MCV: 88.8 fl (ref 78.0–100.0)
Monocytes Absolute: 1 K/uL (ref 0.1–1.0)
Monocytes Relative: 9.1 % (ref 3.0–12.0)
Neutro Abs: 7.1 K/uL (ref 1.4–7.7)
Neutrophils Relative %: 62.9 % (ref 43.0–77.0)
Platelets: 366 K/uL (ref 150.0–400.0)
RBC: 4.4 Mil/uL (ref 3.87–5.11)
RDW: 14.4 % (ref 11.5–15.5)
WBC: 11.2 K/uL — ABNORMAL HIGH (ref 4.0–10.5)

## 2018-10-25 LAB — LIPID PANEL
Cholesterol: 266 mg/dL — ABNORMAL HIGH (ref 0–200)
HDL: 55.7 mg/dL (ref >39.00)
NonHDL: 210.72
Total CHOL/HDL Ratio: 5
Triglycerides: 315 mg/dL — ABNORMAL HIGH (ref 0.0–149.0)
VLDL: 63 mg/dL — ABNORMAL HIGH (ref 0.0–40.0)

## 2018-10-25 LAB — LDL CHOLESTEROL, DIRECT: Direct LDL: 173 mg/dL

## 2018-10-25 LAB — TSH: TSH: 0.11 u[IU]/mL — ABNORMAL LOW (ref 0.35–4.50)

## 2018-10-25 MED ORDER — VASCEPA 1 G PO CAPS: 2.0000 | ORAL_CAPSULE | Freq: Two times a day (BID) | ORAL | 1 refills | Status: DC

## 2018-10-25 MED ORDER — LEVOTHYROXINE SODIUM 150 MCG PO TABS: 150.0000 ug | ORAL_TABLET | Freq: Every day | ORAL | 0 refills | Status: DC

## 2018-10-26 ENCOUNTER — Encounter: Payer: Self-pay | Admitting: Internal Medicine

## 2018-10-31 ENCOUNTER — Other Ambulatory Visit: Payer: Self-pay | Admitting: Internal Medicine

## 2018-10-31 DIAGNOSIS — E781 Pure hyperglyceridemia: Secondary | ICD-10-CM

## 2018-10-31 MED ORDER — OMEGA-3-ACID ETHYL ESTERS 1 G PO CAPS: 2.0000 g | ORAL_CAPSULE | Freq: Two times a day (BID) | ORAL | 1 refills | Status: DC

## 2018-11-01 ENCOUNTER — Telehealth: Payer: Self-pay

## 2018-11-01 NOTE — Telephone Encounter (Signed)
Key: Holly Mcmillan

## 2018-11-01 NOTE — Telephone Encounter (Signed)
lvm for pt to call back.  RE: PA for [employer] has been approved.

## 2018-11-06 ENCOUNTER — Encounter: Payer: Self-pay | Admitting: Internal Medicine

## 2018-11-08 ENCOUNTER — Other Ambulatory Visit: Payer: Self-pay | Admitting: Internal Medicine

## 2018-11-08 DIAGNOSIS — F325 Major depressive disorder, single episode, in full remission: Secondary | ICD-10-CM

## 2018-11-08 MED ORDER — VIIBRYD 40 MG PO TABS: 40.0000 mg | ORAL_TABLET | Freq: Every day | ORAL | 1 refills | Status: DC

## 2018-11-15 NOTE — Telephone Encounter (Signed)
Key: [id number]

## 2018-11-25 ENCOUNTER — Encounter: Payer: Self-pay | Admitting: Internal Medicine

## 2018-11-25 ENCOUNTER — Ambulatory Visit (INDEPENDENT_AMBULATORY_CARE_PROVIDER_SITE_OTHER): Payer: 59 | Admitting: Internal Medicine

## 2018-11-25 ENCOUNTER — Other Ambulatory Visit: Payer: Self-pay

## 2018-11-25 DIAGNOSIS — L0292 Furuncle, unspecified: Secondary | ICD-10-CM | POA: Insufficient documentation

## 2018-11-25 MED ORDER — AMOXICILLIN-POT CLAVULANATE 875-125 MG PO TABS: 1.0000 | ORAL_TABLET | Freq: Two times a day (BID) | ORAL | 0 refills | Status: DC

## 2018-11-25 NOTE — Assessment & Plan Note (Signed)
She is 2 boils on her left proximal-medial thigh that appear to be infected She would for not to have an incision and drainage and I do not think these can be drained at this point Continue warm compresses We will start Augmentin twice daily x10 days-she has never had MRSA She will call if there is no improvement or return if needed for possible I&D

## 2018-11-25 NOTE — Patient Instructions (Signed)
Take the antibiotic as prescribed - if your symptoms do not resolve please call or come back in.    Do warm compresses.

## 2018-11-25 NOTE — Progress Notes (Signed)
Subjective:    Patient ID: Holly Mcmillan, female    DOB: July 21, 1970, 48 y.o.   MRN: [id number]  HPI The patient is here for an acute visit.   Boil on leg:  She noticed it two nights ago it is very painful and purple-red in color.  She has a history of recurrent boils and they have occurred in multiple places throughout her body.  She has been treated with antibiotics and sometimes needs to have them drained.  This 1 feels deeper, but is very painful.  She has tried warm compresses.  She has not had any fevers or chills.  There is no open wound or drainage.    Medications and allergies reviewed with patient and updated if appropriate.  Patient Active Problem List   Diagnosis Date Noted  . Boil 11/25/2018  . Arthritis of right hip 11/17/2017  . Lumbar radiculopathy, right 07/06/2017  . Eczema of face 05/26/2017  . Piriformis syndrome of right side 08/17/2016  . Routine general medical examination at a health care facility 03/16/2016  . RLS (restless legs syndrome) 03/16/2016  . Hypertriglyceridemia, essential 04/04/2015  . Hyperlipidemia with target LDL less than 100 04/04/2015  . Severe obesity (BMI >= 40) ([place]) 09/20/2013  . Hyperglycemia 09/30/2011  . Hypothyroidism 07/09/2010  . Allergic rhinitis due to pollen 06/30/2010  . TOBACCO USE 11/28/2009  . Depression, major, in remission ([place]) 11/28/2009  . Essential hypertension 11/28/2009    Current Outpatient Medications on File Prior to Visit  Medication Sig Dispense Refill  . buPROPion (WELLBUTRIN XL) 300 MG 24 hr tablet TAKE 1 TABLET BY MOUTH EVERY DAY 30 tablet 5  . DULoxetine (CYMBALTA) 30 MG capsule TAKE 1 CAPSULE BY MOUTH EVERY DAY 30 capsule 0  . fluticasone (FLONASE) 50 MCG/ACT nasal spray Place 2 sprays into both nostrils daily. 48 g 1  . gabapentin (NEURONTIN) 300 MG capsule TAKE 1 CAPSULE BY MOUTH EVERY NIGHT. 30 capsule 3  . levothyroxine (SYNTHROID) 150 MCG tablet Take 1 tablet (150 mcg total) by mouth daily.  90 tablet 0  . LO LOESTRIN FE 1 MG-10 MCG / 10 MCG tablet Take 1 tablet by mouth daily.  6  . omega-3 acid ethyl esters (LOVAZA) 1 g capsule Take 2 capsules (2 g total) by mouth 2 (two) times daily. 360 capsule 1  . telmisartan-hydrochlorothiazide (MICARDIS HCT) 40-12.5 MG tablet TAKE 1 TABLET BY MOUTH EVERY DAY 30 tablet 3  . valACYclovir (VALTREX) 1000 MG tablet TAKE 2 TABLETS BY MOUTH 2 TIMES DAILY FOR 2 DOSES. 4 tablet 2  . Vilazodone HCl (VIIBRYD) 40 MG TABS Take 1 tablet (40 mg total) by mouth daily. 90 tablet 1  . Vitamin D, Ergocalciferol, (DRISDOL) 1.25 MG (50000 UT) CAPS capsule TAKE 1 CAPSULE ONCE EVERY 7 DAYS 4 capsule 2  . potassium chloride (K-DUR) 10 MEQ tablet Take 3 tablets (30 mEq total) by mouth daily for 2 days. 6 tablet 0   No current facility-administered medications on file prior to visit.     Past Medical History:  Diagnosis Date  . Depression   . Dysmenorrhea   . Gallbladder disease   . Headache(784.0)   . Hypertension   . Hypothyroidism   . Obesity   . Ovarian cyst     Past Surgical History:  Procedure Laterality Date  . ablation    . BREAST CYST EXCISION Right 06/24/2016   Procedure: EXCISION RIGHT BREAST CYST;  Surgeon: Coralie Keens, MD;  Location: [facility];  Service: General;  Laterality: Right;  . CESAREAN SECTION  2002, 2009  . CHOLECYSTECTOMY  2005  . NASAL SINUS SURGERY    . THYROID SURGERY  2012    Social History   Socioeconomic History  . Marital status: Married    Spouse name: Not on file  . Number of children: 2  . Years of education: Not on file  . Highest education level: Not on file  Occupational History  . Occupation: Art therapist: Civil engineer, contracting  Social Needs  . Financial resource strain: Not on file  . Food insecurity    Worry: Not on file    Inability: Not on file  . Transportation needs    Medical: Not on file    Non-medical: Not on file  Tobacco Use  . Smoking status: Former Smoker     Packs/day: 0.30    Years: 20.00    Pack years: 6.00    Types: Cigarettes  . Smokeless tobacco: Never Used  Substance and Sexual Activity  . Alcohol use: No  . Drug use: No  . Sexual activity: Not Currently  Lifestyle  . Physical activity    Days per week: Not on file    Minutes per session: Not on file  . Stress: Not on file  Relationships  . Social Herbalist on phone: Not on file    Gets together: Not on file    Attends religious service: Not on file    Active member of club or organization: Not on file    Attends meetings of clubs or organizations: Not on file    Relationship status: Not on file  Other Topics Concern  . Not on file  Social History Narrative  . Not on file    Family History  Problem Relation Age of Onset  . Alcohol abuse Paternal Uncle        x2  . Arthritis Father   . Hypertension Father   . Cancer Other   . Heart disease Mother        CHF, A Fib  . Hypertension Mother   . Cervical cancer Mother   . Leukemia Mother   . Colon cancer Maternal Aunt   . Alcohol abuse Maternal Uncle        x2  . Heart disease Maternal Grandfather   . Pancreatic cancer Maternal Aunt   . Lung cancer Maternal Uncle     Review of Systems  Constitutional: Negative for chills and fever.  Skin: Positive for color change (Boil-no open wound or ulcer). Negative for rash and wound.  Neurological: Negative for light-headedness and headaches.       Objective:   Vitals:   11/25/18 1427  BP: 128/84  Pulse: 92  Resp: 16  Temp: 98.2 F (36.8 C)  SpO2: 99%   BP Readings from Last 3 Encounters:  11/25/18 128/84  03/28/18 111/64  02/21/18 122/80   Wt Readings from Last 3 Encounters:  11/25/18 225 lb 12.8 oz (102.4 kg)  02/21/18 209 lb 8 oz (95 kg)  01/05/18 207 lb (93.9 kg)   Body mass index is 41.3 kg/m.   Physical Exam Constitutional:      General: She is not in acute distress.    Appearance: Normal appearance. She is not ill-appearing or  diaphoretic.  Skin:    General: Skin is warm and dry.     Findings: Lesion (2 lesions left medial proximal thigh-1 is the size of a pea,  the other the size of a peanut M&M.  Both indurated and very painful.  Both surrounding erythema and purplish discoloration.  No open wound or drainage) present.  Neurological:     Mental Status: She is alert.            Assessment & Plan:    See Problem List for Assessment and Plan of chronic medical problems.

## 2018-12-23 ENCOUNTER — Encounter: Payer: Self-pay | Admitting: Internal Medicine

## 2018-12-26 ENCOUNTER — Other Ambulatory Visit: Payer: 59

## 2018-12-26 ENCOUNTER — Other Ambulatory Visit: Payer: Self-pay | Admitting: Internal Medicine

## 2018-12-26 DIAGNOSIS — R197 Diarrhea, unspecified: Secondary | ICD-10-CM | POA: Insufficient documentation

## 2018-12-27 ENCOUNTER — Other Ambulatory Visit: Payer: 59

## 2018-12-27 DIAGNOSIS — R197 Diarrhea, unspecified: Secondary | ICD-10-CM

## 2018-12-27 LAB — C.DIFFICILE TOXIN: C. Difficile Toxin A: NOT DETECTED

## 2018-12-30 LAB — OVA AND PARASITE EXAMINATION
CONCENTRATE RESULT:: NONE SEEN
MICRO NUMBER:: 808572
SPECIMEN QUALITY:: ADEQUATE
TRICHROME RESULT:: NONE SEEN

## 2018-12-30 LAB — GIARDIA/CRYPTOSPORIDIUM (EIA)
MICRO NUMBER:: 808570
MICRO NUMBER:: 808571
RESULT:: NOT DETECTED
RESULT:: NOT DETECTED
SPECIMEN QUALITY:: ADEQUATE
SPECIMEN QUALITY:: ADEQUATE

## 2018-12-30 LAB — FECAL LACTOFERRIN, QUANT
Fecal Lactoferrin: NEGATIVE
MICRO NUMBER:: 808774
SPECIMEN QUALITY:: ADEQUATE

## 2018-12-31 ENCOUNTER — Encounter: Payer: Self-pay | Admitting: Internal Medicine

## 2019-01-10 ENCOUNTER — Encounter: Payer: Self-pay | Admitting: Internal Medicine

## 2019-01-10 ENCOUNTER — Other Ambulatory Visit (INDEPENDENT_AMBULATORY_CARE_PROVIDER_SITE_OTHER): Payer: 59

## 2019-01-10 ENCOUNTER — Ambulatory Visit (INDEPENDENT_AMBULATORY_CARE_PROVIDER_SITE_OTHER): Payer: 59 | Admitting: Internal Medicine

## 2019-01-10 ENCOUNTER — Other Ambulatory Visit: Payer: Self-pay

## 2019-01-10 VITALS — BP 126/80 | HR 86 | Temp 98.1°F | Ht 62.0 in | Wt 231.0 lb

## 2019-01-10 DIAGNOSIS — E039 Hypothyroidism, unspecified: Secondary | ICD-10-CM

## 2019-01-10 DIAGNOSIS — I10 Essential (primary) hypertension: Secondary | ICD-10-CM

## 2019-01-10 DIAGNOSIS — R1084 Generalized abdominal pain: Secondary | ICD-10-CM | POA: Diagnosis not present

## 2019-01-10 DIAGNOSIS — K580 Irritable bowel syndrome with diarrhea: Secondary | ICD-10-CM | POA: Diagnosis not present

## 2019-01-10 DIAGNOSIS — Z23 Encounter for immunization: Secondary | ICD-10-CM

## 2019-01-10 DIAGNOSIS — K591 Functional diarrhea: Secondary | ICD-10-CM

## 2019-01-10 LAB — CBC WITH DIFFERENTIAL/PLATELET
Basophils Absolute: 0.1 10*3/uL (ref 0.0–0.1)
Basophils Relative: 0.5 % (ref 0.0–3.0)
Eosinophils Absolute: 0.1 10*3/uL (ref 0.0–0.7)
Eosinophils Relative: 0.5 % (ref 0.0–5.0)
HCT: 40.2 % (ref 36.0–46.0)
Hemoglobin: 13.1 g/dL (ref 12.0–15.0)
Lymphocytes Relative: 24.2 % (ref 12.0–46.0)
Lymphs Abs: 3.3 10*3/uL (ref 0.7–4.0)
MCHC: 32.6 g/dL (ref 30.0–36.0)
MCV: 87.6 fl (ref 78.0–100.0)
Monocytes Absolute: 1.3 10*3/uL — ABNORMAL HIGH (ref 0.1–1.0)
Monocytes Relative: 9.8 % (ref 3.0–12.0)
Neutro Abs: 8.8 10*3/uL — ABNORMAL HIGH (ref 1.4–7.7)
Neutrophils Relative %: 65 % (ref 43.0–77.0)
Platelets: 371 10*3/uL (ref 150.0–400.0)
RBC: 4.58 Mil/uL (ref 3.87–5.11)
RDW: 15.3 % (ref 11.5–15.5)
WBC: 13.5 10*3/uL — ABNORMAL HIGH (ref 4.0–10.5)

## 2019-01-10 LAB — HEPATIC FUNCTION PANEL
ALT: 20 U/L (ref 0–35)
AST: 17 U/L (ref 0–37)
Albumin: 4.3 g/dL (ref 3.5–5.2)
Alkaline Phosphatase: 78 U/L (ref 39–117)
Bilirubin, Direct: 0 mg/dL (ref 0.0–0.3)
Total Bilirubin: 0.3 mg/dL (ref 0.2–1.2)
Total Protein: 7.9 g/dL (ref 6.0–8.3)

## 2019-01-10 LAB — BASIC METABOLIC PANEL
BUN: 15 mg/dL (ref 6–23)
CO2: 29 mEq/L (ref 19–32)
Calcium: 9.6 mg/dL (ref 8.4–10.5)
Chloride: 100 mEq/L (ref 96–112)
Creatinine, Ser: 1.1 mg/dL (ref 0.40–1.20)
GFR: 52.67 mL/min — ABNORMAL LOW (ref >60.00)
Glucose, Bld: 79 mg/dL (ref 70–99)
Potassium: 3.9 mEq/L (ref 3.5–5.1)
Sodium: 140 mEq/L (ref 135–145)

## 2019-01-10 LAB — SEDIMENTATION RATE: Sed Rate: 66 mm/hr — ABNORMAL HIGH (ref 0–20)

## 2019-01-10 LAB — TSH: TSH: 16.71 u[IU]/mL — ABNORMAL HIGH (ref 0.35–4.50)

## 2019-01-10 MED ORDER — DICYCLOMINE HCL 10 MG PO CAPS: 10.0000 mg | ORAL_CAPSULE | Freq: Three times a day (TID) | ORAL | 2 refills | Status: DC

## 2019-01-10 MED ORDER — LEVOTHYROXINE SODIUM 200 MCG PO TABS: 200.0000 ug | ORAL_TABLET | Freq: Every day | ORAL | 0 refills | Status: DC

## 2019-01-10 NOTE — Progress Notes (Signed)
Subjective:  Patient ID: Holly Mcmillan, female    DOB: 1971-04-25  Age: 48 y.o. MRN: [id number]  CC: Abdominal Pain   HPI Holly Mcmillan presents for f/up - She recently took a course of antibiotics for a boil.  She has subsequently had intermittent diarrhea and abdominal pain.  She describes about 2 loose bowel movements a day with abdominal cramping.  She has had nausea but no vomiting.  She complains of weight gain.  She denies loss of appetite, bloody stool, melena, odynophagia, dysphagia, fever, or chills.  She has gotten some symptom relief with Pepto-Bismol. Her lactoferrin and other stool studies were negative.  Outpatient Medications Prior to Visit  Medication Sig Dispense Refill  . buPROPion (WELLBUTRIN XL) 300 MG 24 hr tablet TAKE 1 TABLET BY MOUTH EVERY DAY 30 tablet 5  . DULoxetine (CYMBALTA) 30 MG capsule TAKE 1 CAPSULE BY MOUTH EVERY DAY 30 capsule 0  . fluticasone (FLONASE) 50 MCG/ACT nasal spray Place 2 sprays into both nostrils daily. 48 g 1  . omega-3 acid ethyl esters (LOVAZA) 1 g capsule Take 2 capsules (2 g total) by mouth 2 (two) times daily. 360 capsule 1  . telmisartan-hydrochlorothiazide (MICARDIS HCT) 40-12.5 MG tablet TAKE 1 TABLET BY MOUTH EVERY DAY 30 tablet 3  . Vilazodone HCl (VIIBRYD) 40 MG TABS Take 1 tablet (40 mg total) by mouth daily. 90 tablet 1  . levothyroxine (SYNTHROID) 150 MCG tablet Take 1 tablet (150 mcg total) by mouth daily. 90 tablet 0  . potassium chloride (K-DUR) 10 MEQ tablet Take 3 tablets (30 mEq total) by mouth daily for 2 days. 6 tablet 0  . valACYclovir (VALTREX) 1000 MG tablet TAKE 2 TABLETS BY MOUTH 2 TIMES DAILY FOR 2 DOSES. (Patient not taking: Reported on 01/10/2019) 4 tablet 2  . amoxicillin-clavulanate (AUGMENTIN) 875-125 MG tablet Take 1 tablet by mouth 2 (two) times daily. 20 tablet 0  . gabapentin (NEURONTIN) 300 MG capsule TAKE 1 CAPSULE BY MOUTH EVERY NIGHT. 30 capsule 3  . LO LOESTRIN FE 1 MG-10 MCG / 10 MCG tablet Take 1  tablet by mouth daily.  6  . Vitamin D, Ergocalciferol, (DRISDOL) 1.25 MG (50000 UT) CAPS capsule TAKE 1 CAPSULE ONCE EVERY 7 DAYS 4 capsule 2   No facility-administered medications prior to visit.     ROS Review of Systems  Constitutional: Positive for unexpected weight change (wt gain). Negative for activity change, appetite change, chills, diaphoresis, fatigue and fever.  HENT: Negative.   Eyes: Negative.   Respiratory: Negative for cough, chest tightness, shortness of breath and wheezing.   Cardiovascular: Negative for chest pain, palpitations and leg swelling.  Gastrointestinal: Positive for abdominal pain, diarrhea and nausea. Negative for abdominal distention, blood in stool, constipation and vomiting.  Endocrine: Negative.  Negative for cold intolerance and heat intolerance.  Genitourinary: Negative.  Negative for decreased urine volume, difficulty urinating, dysuria, hematuria and urgency.  Musculoskeletal: Negative.  Negative for arthralgias and myalgias.  Skin: Negative.  Negative for color change, pallor and rash.  Neurological: Negative.  Negative for dizziness, weakness, light-headedness and numbness.  Hematological: Negative for adenopathy. Does not bruise/bleed easily.  Psychiatric/Behavioral: Negative.     Objective:  BP 126/80 (BP Location: Left Arm, Patient Position: Sitting, Cuff Size: Large)   Pulse 86   Temp 98.1 F (36.7 C) (Oral)   Ht 5\' 2"  (1.575 m)   Wt 231 lb (104.8 kg)   SpO2 97%   BMI 42.25 kg/m   BP  Readings from Last 3 Encounters:  01/10/19 126/80  11/25/18 128/84  03/28/18 111/64    Wt Readings from Last 3 Encounters:  01/10/19 231 lb (104.8 kg)  11/25/18 225 lb 12.8 oz (102.4 kg)  02/21/18 209 lb 8 oz (95 kg)    Physical Exam Vitals signs reviewed.  Constitutional:      General: She is not in acute distress.    Appearance: She is obese. She is not ill-appearing, toxic-appearing or diaphoretic.  HENT:     Mouth/Throat:     Mouth:  Mucous membranes are moist.  Eyes:     General: No scleral icterus.    Conjunctiva/sclera: Conjunctivae normal.  Neck:     Musculoskeletal: Normal range of motion and neck supple.  Cardiovascular:     Rate and Rhythm: Normal rate and regular rhythm.     Heart sounds: No murmur. No friction rub.  Pulmonary:     Effort: Pulmonary effort is normal.     Breath sounds: No stridor. No wheezing, rhonchi or rales.  Abdominal:     General: Abdomen is protuberant. Bowel sounds are increased. There is no distension.     Palpations: Abdomen is soft. There is no hepatomegaly, splenomegaly or mass.     Tenderness: There is abdominal tenderness in the right upper quadrant, right lower quadrant and left lower quadrant. There is no guarding or rebound.     Hernia: No hernia is present.  Musculoskeletal: Normal range of motion.     Right lower leg: No edema.     Left lower leg: No edema.  Skin:    General: Skin is warm and dry.     Coloration: Skin is not pale.     Findings: No rash.  Neurological:     General: No focal deficit present.     Mental Status: She is alert.  Psychiatric:        Mood and Affect: Mood normal.        Behavior: Behavior normal.     Lab Results  Component Value Date   WBC 13.5 (H) 01/10/2019   HGB 13.1 01/10/2019   HCT 40.2 01/10/2019   PLT 371.0 01/10/2019   GLUCOSE 79 01/10/2019   CHOL 266 (H) 10/25/2018   TRIG 315.0 (H) 10/25/2018   HDL 55.70 10/25/2018   LDLDIRECT 173.0 10/25/2018   LDLCALC 157 (H) 05/26/2017   ALT 20 01/10/2019   AST 17 01/10/2019   NA 140 01/10/2019   K 3.9 01/10/2019   CL 100 01/10/2019   CREATININE 1.10 01/10/2019   BUN 15 01/10/2019   CO2 29 01/10/2019   TSH 16.71 (H) 01/10/2019   HGBA1C 5.5 03/16/2016    Vas [place] Lower Extremity Venous (dvt)  Result Date: 04/27/2018  Lower Venous Study Indications: Pain, Swelling, and One week post right hip replacement.  Performing Technologist: Maudry Mayhew MHA, RDMS, RVT, RDCS   Examination Guidelines: A complete evaluation includes B-mode imaging, spectral Doppler, color Doppler, and power Doppler as needed of all accessible portions of each vessel. Bilateral testing is considered an integral part of a complete examination. Limited examinations for reoccurring indications may be performed as noted.  Right Venous Findings: +---------+---------------+---------+-----------+----------+-------+          CompressibilityPhasicitySpontaneityPropertiesSummary +---------+---------------+---------+-----------+----------+-------+ CFV      Full           Yes      Yes                          +---------+---------------+---------+-----------+----------+-------+  SFJ      Full                                                 +---------+---------------+---------+-----------+----------+-------+ FV Prox  Full                                                 +---------+---------------+---------+-----------+----------+-------+ FV Mid   Full                                                 +---------+---------------+---------+-----------+----------+-------+ FV DistalFull                                                 +---------+---------------+---------+-----------+----------+-------+ PFV      Full                                                 +---------+---------------+---------+-----------+----------+-------+ POP      Full           Yes      Yes                          +---------+---------------+---------+-----------+----------+-------+ PTV      Full                                                 +---------+---------------+---------+-----------+----------+-------+ PERO     Full                                                 +---------+---------------+---------+-----------+----------+-------+  Left Venous Findings: +---+---------------+---------+-----------+----------+-------+    CompressibilityPhasicitySpontaneityPropertiesSummary  +---+---------------+---------+-----------+----------+-------+ CFVFull           Yes      Yes                          +---+---------------+---------+-----------+----------+-------+    Summary: Right: There is no evidence of deep vein thrombosis in the lower extremity. No cystic structure found in the popliteal fossa. Left: No evidence of common femoral vein obstruction.  *See table(s) above for measurements and observations. Electronically signed by Harold Barban MD on 04/27/2018 at 1:51:34 PM.    Final     Assessment & Plan:   [place] was seen today for abdominal pain.  Diagnoses and all orders for this visit:  Need for influenza vaccination -     Flu Vaccine QUAD 36+ mos IM  Generalized abdominal pain- She has had a several week history of abdominal pain with diarrhea.  She  has several areas of tenderness today but the abdominal exam is not consistent with an acute abdominal process.  She has an elevated sed rate and white cell count so I have asked her to undergo a CT scan with contrast to see if there is an occult abscess, area of bowel ischemia/inflammation of the small or large bowel, mass, diverticulitis, or obstruction. -     CBC with Differential/Platelet; Future -     Basic metabolic panel; Future -     Hepatic function panel; Future -     Sedimentation rate; Future -     CT Abdomen Pelvis W Contrast; Future  Acquired hypothyroidism- Her TSH is elevated and she complains of weight gain.  I have asked her to increase her levothyroxine dose from 150 mcg to 200 mcg a day. -     TSH; Future -     levothyroxine (SYNTHROID) 200 MCG tablet; Take 1 tablet (200 mcg total) by mouth daily before breakfast.  Functional diarrhea- I will screen her for celiac disease and will treat her for IBS with diarrhea. -     Gliadin antibodies, serum; Future -     Tissue transglutaminase, IgA; Future -     Reticulin Antibody, IgA w reflex titer; Future  Irritable bowel syndrome with diarrhea -      dicyclomine (BENTYL) 10 MG capsule; Take 1 capsule (10 mg total) by mouth 3 (three) times daily before meals.  Essential hypertension- Her blood pressure is adequately well controlled.   I have discontinued Rockie Neighbours. Kadar's Lo Loestrin Fe, Vitamin D (Ergocalciferol), levothyroxine, gabapentin, and amoxicillin-clavulanate. I am also having her start on dicyclomine and levothyroxine. Additionally, I am having her maintain her fluticasone, potassium chloride, valACYclovir, buPROPion, telmisartan-hydrochlorothiazide, DULoxetine, omega-3 acid ethyl esters, and Viibryd.  Meds ordered this encounter  Medications  . dicyclomine (BENTYL) 10 MG capsule    Sig: Take 1 capsule (10 mg total) by mouth 3 (three) times daily before meals.    Dispense:  90 capsule    Refill:  2  . levothyroxine (SYNTHROID) 200 MCG tablet    Sig: Take 1 tablet (200 mcg total) by mouth daily before breakfast.    Dispense:  90 tablet    Refill:  0     Follow-up: Return in about 3 weeks (around 01/31/2019).  Scarlette Calico, MD

## 2019-01-10 NOTE — Patient Instructions (Signed)

## 2019-01-13 LAB — RETICULIN ANTIBODIES, IGA W TITER: Reticulin IGA Screen: NEGATIVE

## 2019-01-13 LAB — TISSUE TRANSGLUTAMINASE, IGA: (tTG) Ab, IgA: 1 U/mL

## 2019-01-13 LAB — GLIADIN ANTIBODIES, SERUM
Gliadin IgA: 5 Units
Gliadin IgG: 1 Units

## 2019-01-16 ENCOUNTER — Encounter: Payer: Self-pay | Admitting: Internal Medicine

## 2019-01-16 ENCOUNTER — Ambulatory Visit
Admission: RE | Admit: 2019-01-16 | Discharge: 2019-01-16 | Disposition: A | Discharge status: Home / Self Care | Payer: 59 | Source: Ambulatory Visit | Attending: Internal Medicine | Admitting: Internal Medicine

## 2019-01-16 DIAGNOSIS — R1084 Generalized abdominal pain: Secondary | ICD-10-CM

## 2019-01-16 MED ORDER — IOPAMIDOL (ISOVUE-300) INJECTION 61%
100.0000 mL | Freq: Once | INTRAVENOUS | Status: AC | PRN
  Administered 2019-01-16: 100 mL via INTRAVENOUS

## 2019-01-17 ENCOUNTER — Other Ambulatory Visit: Payer: Self-pay | Admitting: Internal Medicine

## 2019-01-17 ENCOUNTER — Encounter: Payer: Self-pay | Admitting: Internal Medicine

## 2019-01-17 DIAGNOSIS — E039 Hypothyroidism, unspecified: Secondary | ICD-10-CM

## 2019-01-17 MED ORDER — LEVOTHYROXINE SODIUM 200 MCG PO TABS: 200.0000 ug | ORAL_TABLET | Freq: Every day | ORAL | 0 refills | Status: DC

## 2019-01-22 ENCOUNTER — Other Ambulatory Visit: Payer: Self-pay | Admitting: Internal Medicine

## 2019-01-22 DIAGNOSIS — E039 Hypothyroidism, unspecified: Secondary | ICD-10-CM

## 2019-02-07 ENCOUNTER — Other Ambulatory Visit: Payer: Self-pay | Admitting: Internal Medicine

## 2019-02-07 DIAGNOSIS — I10 Essential (primary) hypertension: Secondary | ICD-10-CM

## 2019-02-09 ENCOUNTER — Other Ambulatory Visit: Payer: Self-pay | Admitting: Internal Medicine

## 2019-02-09 DIAGNOSIS — J301 Allergic rhinitis due to pollen: Secondary | ICD-10-CM

## 2019-03-07 ENCOUNTER — Other Ambulatory Visit: Payer: Self-pay | Admitting: Internal Medicine

## 2019-03-07 DIAGNOSIS — F325 Major depressive disorder, single episode, in full remission: Secondary | ICD-10-CM

## 2019-03-20 ENCOUNTER — Encounter: Payer: Self-pay | Admitting: Internal Medicine

## 2019-03-22 ENCOUNTER — Ambulatory Visit (INDEPENDENT_AMBULATORY_CARE_PROVIDER_SITE_OTHER): Payer: 59 | Admitting: Internal Medicine

## 2019-03-22 ENCOUNTER — Other Ambulatory Visit (INDEPENDENT_AMBULATORY_CARE_PROVIDER_SITE_OTHER): Payer: 59

## 2019-03-22 ENCOUNTER — Other Ambulatory Visit: Payer: Self-pay

## 2019-03-22 ENCOUNTER — Encounter: Payer: Self-pay | Admitting: Internal Medicine

## 2019-03-22 VITALS — BP 138/88 | HR 74 | Temp 98.0°F | Ht 62.0 in | Wt 225.0 lb

## 2019-03-22 DIAGNOSIS — E785 Hyperlipidemia, unspecified: Secondary | ICD-10-CM

## 2019-03-22 DIAGNOSIS — E781 Pure hyperglyceridemia: Secondary | ICD-10-CM | POA: Diagnosis not present

## 2019-03-22 DIAGNOSIS — E039 Hypothyroidism, unspecified: Secondary | ICD-10-CM | POA: Diagnosis not present

## 2019-03-22 DIAGNOSIS — I10 Essential (primary) hypertension: Secondary | ICD-10-CM

## 2019-03-22 DIAGNOSIS — Z114 Encounter for screening for human immunodeficiency virus [HIV]: Secondary | ICD-10-CM

## 2019-03-22 DIAGNOSIS — K580 Irritable bowel syndrome with diarrhea: Secondary | ICD-10-CM

## 2019-03-22 LAB — LIPID PANEL
Cholesterol: 293 mg/dL — ABNORMAL HIGH (ref 0–200)
HDL: 53.4 mg/dL (ref >39.00)
LDL Cholesterol: 201 mg/dL — ABNORMAL HIGH (ref 0–99)
NonHDL: 240.06
Total CHOL/HDL Ratio: 5
Triglycerides: 195 mg/dL — ABNORMAL HIGH (ref 0.0–149.0)
VLDL: 39 mg/dL (ref 0.0–40.0)

## 2019-03-22 LAB — TSH: TSH: 18.73 u[IU]/mL — ABNORMAL HIGH (ref 0.35–4.50)

## 2019-03-22 MED ORDER — LEVOTHYROXINE SODIUM 200 MCG PO TABS: 200.0000 ug | ORAL_TABLET | Freq: Every day | ORAL | 0 refills | Status: DC

## 2019-03-22 MED ORDER — RIFAXIMIN 550 MG PO TABS: 550.0000 mg | ORAL_TABLET | Freq: Three times a day (TID) | ORAL | 0 refills | Status: AC

## 2019-03-22 MED ORDER — LEVOTHYROXINE SODIUM 50 MCG PO TABS: 50.0000 ug | ORAL_TABLET | Freq: Every day | ORAL | 0 refills | Status: DC

## 2019-03-22 NOTE — Progress Notes (Signed)
Subjective:  Patient ID: Holly Mcmillan, female    DOB: Jul 05, 1970  Age: 48 y.o. MRN: [id number]  CC: Hyperlipidemia and Hypothyroidism   HPI Holly Mcmillan presents for f/up - She continues to complain of diarrhea.  She tells me that about every other day she has watery, crampy, urgent diarrhea.  She tells me that she is not getting any symptom relief with dicyclomine.  She denies loss of appetite, bloody stool, melena, fever, or chills.  Outpatient Medications Prior to Visit  Medication Sig Dispense Refill   buPROPion (WELLBUTRIN XL) 300 MG 24 hr tablet TAKE 1 TABLET BY MOUTH EVERY DAY 30 tablet 5   fluticasone (FLONASE) 50 MCG/ACT nasal spray SPRAY 2 SPRAYS INTO EACH NOSTRIL EVERY DAY 16 mL 5   omega-3 acid ethyl esters (LOVAZA) 1 g capsule Take 2 capsules (2 g total) by mouth 2 (two) times daily. 360 capsule 1   telmisartan-hydrochlorothiazide (MICARDIS HCT) 40-12.5 MG tablet TAKE 1 TABLET BY MOUTH EVERY DAY 30 tablet 3   valACYclovir (VALTREX) 1000 MG tablet TAKE 2 TABLETS BY MOUTH 2 TIMES DAILY FOR 2 DOSES. 4 tablet 2   Vilazodone HCl (VIIBRYD) 40 MG TABS Take 1 tablet (40 mg total) by mouth daily. 90 tablet 1   dicyclomine (BENTYL) 10 MG capsule Take 1 capsule (10 mg total) by mouth 3 (three) times daily before meals. 90 capsule 2   DULoxetine (CYMBALTA) 30 MG capsule TAKE 1 CAPSULE BY MOUTH EVERY DAY 30 capsule 0   levothyroxine (SYNTHROID) 200 MCG tablet Take 1 tablet (200 mcg total) by mouth daily before breakfast. 90 tablet 0   potassium chloride (K-DUR) 10 MEQ tablet Take 3 tablets (30 mEq total) by mouth daily for 2 days. 6 tablet 0   No facility-administered medications prior to visit.     ROS Review of Systems  Constitutional: Negative for appetite change, chills, diaphoresis, fatigue and fever.  HENT: Negative.   Eyes: Negative for visual disturbance.  Respiratory: Negative for cough, chest tightness, shortness of breath and wheezing.   Cardiovascular:  Negative for chest pain, palpitations and leg swelling.  Gastrointestinal: Positive for diarrhea. Negative for abdominal pain, blood in stool, nausea and rectal pain.  Endocrine: Negative.  Negative for cold intolerance and heat intolerance.  Genitourinary: Negative.  Negative for difficulty urinating.  Musculoskeletal: Negative.  Negative for arthralgias and myalgias.  Skin: Negative.  Negative for color change and pallor.  Neurological: Negative.  Negative for dizziness, weakness, light-headedness and headaches.  Hematological: Negative for adenopathy. Does not bruise/bleed easily.  Psychiatric/Behavioral: Negative.     Objective:  BP 138/88 (BP Location: Left Arm, Patient Position: Sitting, Cuff Size: Large)    Pulse 74    Temp 98 F (36.7 C) (Oral)    Ht 5\' 2"  (1.575 m)    Wt 225 lb (102.1 kg)    SpO2 98%    BMI 41.15 kg/m   BP Readings from Last 3 Encounters:  03/22/19 138/88  01/10/19 126/80  11/25/18 128/84    Wt Readings from Last 3 Encounters:  03/22/19 225 lb (102.1 kg)  01/10/19 231 lb (104.8 kg)  11/25/18 225 lb 12.8 oz (102.4 kg)    Physical Exam Vitals signs reviewed.  Constitutional:      General: She is not in acute distress.    Appearance: Normal appearance. She is obese. She is not ill-appearing, toxic-appearing or diaphoretic.  HENT:     Nose: Nose normal.     Mouth/Throat:  Mouth: Mucous membranes are moist.  Eyes:     General: No scleral icterus.    Conjunctiva/sclera: Conjunctivae normal.  Neck:     Musculoskeletal: Neck supple.  Cardiovascular:     Rate and Rhythm: Normal rate and regular rhythm.     Heart sounds: No murmur.  Pulmonary:     Effort: Pulmonary effort is normal.     Breath sounds: No stridor. No wheezing, rhonchi or rales.  Abdominal:     General: Abdomen is protuberant. Bowel sounds are normal. There is no distension.     Palpations: Abdomen is soft. There is no hepatomegaly or splenomegaly.     Tenderness: There is no  abdominal tenderness.  Musculoskeletal: Normal range of motion.     Right lower leg: No edema.     Left lower leg: No edema.  Lymphadenopathy:     Cervical: No cervical adenopathy.  Skin:    General: Skin is warm and dry.     Coloration: Skin is not pale.  Neurological:     General: No focal deficit present.     Mental Status: She is alert.  Psychiatric:        Mood and Affect: Mood normal.        Behavior: Behavior normal.     Lab Results  Component Value Date   WBC 13.5 (H) 01/10/2019   HGB 13.1 01/10/2019   HCT 40.2 01/10/2019   PLT 371.0 01/10/2019   GLUCOSE 79 01/10/2019   CHOL 293 (H) 03/22/2019   TRIG 195.0 (H) 03/22/2019   HDL 53.40 03/22/2019   LDLDIRECT 173.0 10/25/2018   LDLCALC 201 (H) 03/22/2019   ALT 20 01/10/2019   AST 17 01/10/2019   NA 140 01/10/2019   K 3.9 01/10/2019   CL 100 01/10/2019   CREATININE 1.10 01/10/2019   BUN 15 01/10/2019   CO2 29 01/10/2019   TSH 18.73 (H) 03/22/2019   HGBA1C 5.5 03/16/2016    Ct Abdomen Pelvis W Contrast  Result Date: 01/17/2019 CLINICAL DATA:  Generalized abdominal pain, primarily right-sided EXAM: CT ABDOMEN AND PELVIS WITH CONTRAST TECHNIQUE: Multidetector CT imaging of the abdomen and pelvis was performed using the standard protocol following bolus administration of intravenous contrast. Oral contrast was also administered. CONTRAST:  127mL ISOVUE-300 IOPAMIDOL (ISOVUE-300) INJECTION 61% COMPARISON:  March 28, 2018 FINDINGS: Lower chest: Lung bases are clear. Hepatobiliary: There is a degree of hepatic steatosis. No focal liver lesions are evident. Gallbladder is absent. There is no appreciable biliary duct dilatation. Pancreas: No pancreatic mass or inflammatory focus. Spleen: No splenic lesions are evident. Adrenals/Urinary Tract: Adrenals bilaterally appear unremarkable. Kidneys bilaterally show no evident mass or hydronephrosis on either side. There is no evident renal or ureteral calculus on either side.  Urinary bladder is midline with wall thickness within normal limits. Stomach/Bowel: There is no appreciable bowel wall or mesenteric thickening. No evident bowel obstruction. Terminal ileum appears unremarkable. There is no evident free air or portal venous air. Vascular/Lymphatic: There is no abdominal aortic aneurysm. There is slight calcification in the distal aorta. There is no evident adenopathy in the abdomen or pelvis. Reproductive: Uterus is anteverted. There is no evident pelvic mass. Other: Appendix appears normal. There is no abscess or ascites in the abdomen or pelvis. There is rectus muscle thinning in the anterior pelvis without frank herniation, stable. Musculoskeletal: There is degenerative change in the lumbar spine with disc space narrowing moderately severe at L2-3. 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 There are  no blastic or lytic bone lesions. No intramuscular lesions are evident. IMPRESSION: 1. No evident bowel obstruction. No abscess in the abdomen or pelvis. Appendix appears normal. 2. No evident renal or ureteral calculus. No hydronephrosis. Urinary bladder wall thickness is normal. 3.  Hepatic steatosis.  Gallbladder absent. Electronically Signed   By: Lowella Grip III M.D.   On: 01/17/2019 07:59    Assessment & Plan:   Holly Mcmillan was seen today for hyperlipidemia and hypothyroidism.  Diagnoses and all orders for this visit:  Essential hypertension-her blood pressure is adequately well controlled.  Acquired hypothyroidism-her TSH is elevated at 18.73.  I recommended that she switch to brand-name Synthroid and increase the dose to 250 mcg a day. -     TSH; Future -     levothyroxine (SYNTHROID) 200 MCG tablet; Take 1 tablet (200 mcg total) by mouth daily before breakfast. -     levothyroxine (SYNTHROID) 50 MCG tablet; Take 1 tablet (50 mcg total) by mouth daily before breakfast.  Hypertriglyceridemia, essential-improvement noted. -     Lipid panel;  Future  Hyperlipidemia with target LDL less than 100- Her LDL has gone up quite a bit over the last year.  I am convinced this is related to the hypothyroidism so at this time I did not recommend a statin for CV risk reduction. -     Lipid panel; Future  Encounter for screening for HIV -     HIV antibody (Reflex); Future  Irritable bowel syndrome with diarrhea -     rifaximin (XIFAXAN) 550 MG TABS tablet; Take 1 tablet (550 mg total) by mouth 3 (three) times daily for 14 days.   I have discontinued Steph Dockum. Sinquefield's DULoxetine and dicyclomine. I am also having her start on rifaximin and levothyroxine. Additionally, I am having her maintain her potassium chloride, valACYclovir, omega-3 acid ethyl esters, Viibryd, telmisartan-hydrochlorothiazide, fluticasone, buPROPion, and levothyroxine.  Meds ordered this encounter  Medications   rifaximin (XIFAXAN) 550 MG TABS tablet    Sig: Take 1 tablet (550 mg total) by mouth 3 (three) times daily for 14 days.    Dispense:  42 tablet    Refill:  0   levothyroxine (SYNTHROID) 200 MCG tablet    Sig: Take 1 tablet (200 mcg total) by mouth daily before breakfast.    Dispense:  90 tablet    Refill:  0   levothyroxine (SYNTHROID) 50 MCG tablet    Sig: Take 1 tablet (50 mcg total) by mouth daily before breakfast.    Dispense:  90 tablet    Refill:  0     Follow-up: Return in about 4 months (around 07/20/2019).  Scarlette Calico, MD

## 2019-03-22 NOTE — Patient Instructions (Signed)

## 2019-03-23 ENCOUNTER — Encounter: Payer: Self-pay | Admitting: Internal Medicine

## 2019-03-23 LAB — HIV ANTIBODY (ROUTINE TESTING W REFLEX): HIV 1&2 Ab, 4th Generation: NONREACTIVE

## 2019-04-05 ENCOUNTER — Encounter: Payer: Self-pay | Admitting: Internal Medicine

## 2019-04-05 DIAGNOSIS — E039 Hypothyroidism, unspecified: Secondary | ICD-10-CM

## 2019-04-05 MED ORDER — SYNTHROID 200 MCG PO TABS: 200.0000 ug | ORAL_TABLET | Freq: Every day | ORAL | 1 refills | Status: DC

## 2019-04-05 MED ORDER — SYNTHROID 50 MCG PO TABS: 50.0000 ug | ORAL_TABLET | Freq: Every day | ORAL | 1 refills | Status: DC

## 2019-04-14 ENCOUNTER — Ambulatory Visit: Payer: Self-pay

## 2019-04-14 NOTE — Telephone Encounter (Signed)
  Pt. reports he hurt her left shoulder exercising about 2 weeks ago. This week noticed tingling in her fingers this week. No neck pain.Warm transfer to [facility] in the practice for a visit. Answer Assessment - Initial Assessment Questions 1. ONSET: "When did the pain start?"     2 weeks ago 2. LOCATION: "Where is the pain located?"     Left shoulder 3. PAIN: "How bad is the pain?" (Scale 1-10; or mild, moderate, severe)   - MILD (1-3): doesn't interfere with normal activities   - MODERATE (4-7): interferes with normal activities (e.g., work or school) or awakens from sleep   - SEVERE (8-10): excruciating pain, unable to do any normal activities, unable to move arm at all due to pain     8 4. WORK OR EXERCISE: "Has there been any recent work or exercise that involved this part of the body?"     Exercise 5. CAUSE: "What do you think is causing the shoulder pain?"     Unsure 6. OTHER SYMPTOMS: "Do you have any other symptoms?" (e.g., neck pain, swelling, rash, fever, numbness, weakness)     Tingling in fingers 7. PREGNANCY: "Is there any chance you are pregnant?" "When was your last menstrual period?"     No  Protocols used: SHOULDER PAIN-A-AH

## 2019-04-17 ENCOUNTER — Encounter: Payer: Self-pay | Admitting: Internal Medicine

## 2019-04-17 ENCOUNTER — Ambulatory Visit (INDEPENDENT_AMBULATORY_CARE_PROVIDER_SITE_OTHER)
Admission: RE | Admit: 2019-04-17 | Discharge: 2019-04-17 | Disposition: A | Discharge status: Home / Self Care | Payer: Managed Care, Other (non HMO) | Source: Ambulatory Visit | Attending: Internal Medicine | Admitting: Internal Medicine

## 2019-04-17 ENCOUNTER — Other Ambulatory Visit: Payer: Self-pay

## 2019-04-17 ENCOUNTER — Ambulatory Visit (INDEPENDENT_AMBULATORY_CARE_PROVIDER_SITE_OTHER): Payer: Managed Care, Other (non HMO) | Admitting: Internal Medicine

## 2019-04-17 VITALS — BP 130/84 | HR 86 | Temp 98.1°F | Resp 16 | Ht 62.0 in | Wt 221.0 lb

## 2019-04-17 DIAGNOSIS — M25512 Pain in left shoulder: Secondary | ICD-10-CM

## 2019-04-17 DIAGNOSIS — M67912 Unspecified disorder of synovium and tendon, left shoulder: Secondary | ICD-10-CM

## 2019-04-17 DIAGNOSIS — M5416 Radiculopathy, lumbar region: Secondary | ICD-10-CM | POA: Diagnosis not present

## 2019-04-17 DIAGNOSIS — M8949 Other hypertrophic osteoarthropathy, multiple sites: Secondary | ICD-10-CM

## 2019-04-17 DIAGNOSIS — M159 Polyosteoarthritis, unspecified: Secondary | ICD-10-CM

## 2019-04-17 DIAGNOSIS — M150 Primary generalized (osteo)arthritis: Secondary | ICD-10-CM

## 2019-04-17 MED ORDER — TRAMADOL HCL 50 MG PO TABS: 50.0000 mg | ORAL_TABLET | Freq: Four times a day (QID) | ORAL | 5 refills | Status: AC | PRN

## 2019-04-17 NOTE — Patient Instructions (Signed)
Shoulder Pain Many things can cause shoulder pain, including:  An injury to the shoulder.  Overuse of the shoulder.  Arthritis. The source of the pain can be:  Inflammation.  An injury to the shoulder joint.  An injury to a tendon, ligament, or bone. Follow these instructions at home: Pay attention to changes in your symptoms. Let your health care provider know about them. Follow these instructions to relieve your pain. If you have a sling:  Wear the sling as told by your health care provider. Remove it only as told by your health care provider.  Loosen the sling if your fingers tingle, become numb, or turn cold and blue.  Keep the sling clean.  If the sling is not waterproof: ? Do not let it get wet. Remove it to shower or bathe.  Move your arm as little as possible, but keep your hand moving to prevent swelling. Managing pain, stiffness, and swelling   If directed, put ice on the painful area: ? Put ice in a plastic bag. ? Place a towel between your skin and the bag. ? Leave the ice on for 20 minutes, 2-3 times per day. Stop applying ice if it does not help with the pain.  Squeeze a soft ball or a foam pad as much as possible. This helps to keep the shoulder from swelling. It also helps to strengthen the arm. General instructions  Take over-the-counter and prescription medicines only as told by your health care provider.  Keep all follow-up visits as told by your health care provider. This is important. Contact a health care provider if:  Your pain gets worse.  Your pain is not relieved with medicines.  New pain develops in your arm, hand, or fingers. Get help right away if:  Your arm, hand, or fingers: ? Tingle. ? Become numb. ? Become swollen. ? Become painful. ? Turn white or blue. Summary  Shoulder pain can be caused by an injury, overuse, or arthritis.  Pay attention to changes in your symptoms. Let your health care provider know about them.   This condition may be treated with a sling, ice, and pain medicines.  Contact your health care provider if the pain gets worse or new pain develops. Get help right away if your arm, hand, or fingers tingle or become numb, swollen, or painful.  Keep all follow-up visits as told by your health care provider. This is important. This information is not intended to replace advice given to you by your health care provider. Make sure you discuss any questions you have with your health care provider. Document Released: 01/28/2005 Document Revised: 11/02/2017 Document Reviewed: 11/02/2017 Elsevier Patient Education  2020 [employer]  

## 2019-04-17 NOTE — Progress Notes (Signed)
Subjective:  Patient ID: Holly Mcmillan, female    DOB: [birth year]  Age: 48 y.o. MRN: [id number]  CC: Shoulder Pain  This visit occurred during the SARS-CoV-2 public health emergency.  Safety protocols were in place, including screening questions prior to the visit, additional usage of staff PPE, and extensive cleaning of exam room while observing appropriate contact time as indicated for disinfecting solutions.   HPI Holly Mcmillan presents for f/up - She tells me that the diarrhea has resolved after a course of Xifaxan.  Today she complains of pain in her large joints.  Her most significant and newest pain is in her left shoulder.  She describes working out a few weeks ago and feels like she injured her left shoulder.  She is developing a decreased range of motion.  She has not gotten much symptom relief with ibuprofen and Tylenol.  She tells me the pain interferes with her sleep and her daily activities.  She is tearful today when speaking about the pain in her large joints.  Outpatient Medications Prior to Visit  Medication Sig Dispense Refill  . buPROPion (WELLBUTRIN XL) 300 MG 24 hr tablet TAKE 1 TABLET BY MOUTH EVERY DAY 30 tablet 5  . fluticasone (FLONASE) 50 MCG/ACT nasal spray SPRAY 2 SPRAYS INTO EACH NOSTRIL EVERY DAY 16 mL 5  . omega-3 acid ethyl esters (LOVAZA) 1 g capsule Take 2 capsules (2 g total) by mouth 2 (two) times daily. 360 capsule 1  . SYNTHROID 200 MCG tablet Take 1 tablet (200 mcg total) by mouth daily before breakfast. 90 tablet 1  . SYNTHROID 50 MCG tablet Take 1 tablet (50 mcg total) by mouth daily before breakfast. 90 tablet 1  . telmisartan-hydrochlorothiazide (MICARDIS HCT) 40-12.5 MG tablet TAKE 1 TABLET BY MOUTH EVERY DAY 30 tablet 3  . valACYclovir (VALTREX) 1000 MG tablet TAKE 2 TABLETS BY MOUTH 2 TIMES DAILY FOR 2 DOSES. 4 tablet 2  . Vilazodone HCl (VIIBRYD) 40 MG TABS Take 1 tablet (40 mg total) by mouth daily. 90 tablet 1  . potassium chloride (K-DUR) 10  MEQ tablet Take 3 tablets (30 mEq total) by mouth daily for 2 days. 6 tablet 0   No facility-administered medications prior to visit.    ROS Review of Systems  Constitutional: Negative.  Negative for diaphoresis and fatigue.  HENT: Negative.   Eyes: Negative.   Respiratory: Negative for chest tightness and shortness of breath.   Cardiovascular: Negative for chest pain, palpitations and leg swelling.  Gastrointestinal: Negative for abdominal pain, constipation, diarrhea, nausea and vomiting.  Endocrine: Negative.   Genitourinary: Negative.  Negative for difficulty urinating.  Musculoskeletal: Positive for arthralgias and back pain. Negative for myalgias.       She has pain in all of her large joints.  She does not have any red or swollen joints.  Skin: Negative.  Negative for color change.    Objective:  BP 130/84 (BP Location: Left Arm, Patient Position: Sitting, Cuff Size: Large)   Pulse 86   Temp 98.1 F (36.7 C) (Oral)   Resp 16   Ht 5\' 2"  (1.575 m)   Wt 221 lb (100.2 kg)   SpO2 99%   BMI 40.42 kg/m   BP Readings from Last 3 Encounters:  04/17/19 130/84  03/22/19 138/88  01/10/19 126/80    Wt Readings from Last 3 Encounters:  04/17/19 221 lb (100.2 kg)  03/22/19 225 lb (102.1 kg)  01/10/19 231 lb (104.8 kg)  Physical Exam Vitals reviewed.  Constitutional:      General: She is not in acute distress.    Appearance: Normal appearance. She is obese. She is not ill-appearing, toxic-appearing or diaphoretic.  HENT:     Nose: Nose normal.     Mouth/Throat:     Mouth: Mucous membranes are moist.  Eyes:     General: No scleral icterus.    Conjunctiva/sclera: Conjunctivae normal.  Cardiovascular:     Rate and Rhythm: Normal rate and regular rhythm.     Heart sounds: No murmur.  Pulmonary:     Effort: Pulmonary effort is normal.     Breath sounds: No stridor. No wheezing, rhonchi or rales.  Abdominal:     General: Abdomen is protuberant. Bowel sounds are  normal. There is no distension.     Palpations: Abdomen is soft. There is no hepatomegaly or splenomegaly.     Tenderness: There is no abdominal tenderness.  Musculoskeletal:        General: Normal range of motion.     Right shoulder: Normal.     Left shoulder: Tenderness and bony tenderness present. No swelling, deformity or crepitus.     Cervical back: Neck supple.     Right lower leg: No edema.     Left lower leg: No edema.     Comments: Multiple positive rotator cuff signs noted in the left shoulder with internal and external rotation  Lymphadenopathy:     Cervical: No cervical adenopathy.  Skin:    General: Skin is warm and dry.  Neurological:     General: No focal deficit present.     Mental Status: She is alert.  Psychiatric:        Mood and Affect: Mood normal. Affect is tearful.     Lab Results  Component Value Date   WBC 13.5 (H) 01/10/2019   HGB 13.1 01/10/2019   HCT 40.2 01/10/2019   PLT 371.0 01/10/2019   GLUCOSE 79 01/10/2019   CHOL 293 (H) 03/22/2019   TRIG 195.0 (H) 03/22/2019   HDL 53.40 03/22/2019   LDLDIRECT 173.0 10/25/2018   LDLCALC 201 (H) 03/22/2019   ALT 20 01/10/2019   AST 17 01/10/2019   NA 140 01/10/2019   K 3.9 01/10/2019   CL 100 01/10/2019   CREATININE 1.10 01/10/2019   BUN 15 01/10/2019   CO2 29 01/10/2019   TSH 18.73 (H) 03/22/2019   HGBA1C 5.5 03/16/2016    CT Abdomen Pelvis W Contrast  Result Date: 01/17/2019 CLINICAL DATA:  Generalized abdominal pain, primarily right-sided EXAM: CT ABDOMEN AND PELVIS WITH CONTRAST TECHNIQUE: Multidetector CT imaging of the abdomen and pelvis was performed using the standard protocol following bolus administration of intravenous contrast. Oral contrast was also administered. CONTRAST:  145mL ISOVUE-300 IOPAMIDOL (ISOVUE-300) INJECTION 61% COMPARISON:  March 28, 2018 FINDINGS: Lower chest: Lung bases are clear. Hepatobiliary: There is a degree of hepatic steatosis. No focal liver lesions are  evident. Gallbladder is absent. There is no appreciable biliary duct dilatation. Pancreas: No pancreatic mass or inflammatory focus. Spleen: No splenic lesions are evident. Adrenals/Urinary Tract: Adrenals bilaterally appear unremarkable. Kidneys bilaterally show no evident mass or hydronephrosis on either side. There is no evident renal or ureteral calculus on either side. Urinary bladder is midline with wall thickness within normal limits. Stomach/Bowel: There is no appreciable bowel wall or mesenteric thickening. No evident bowel obstruction. Terminal ileum appears unremarkable. There is no evident free air or portal venous air. Vascular/Lymphatic: There is no abdominal  aortic aneurysm. There is slight calcification in the distal aorta. There is no evident adenopathy in the abdomen or pelvis. Reproductive: Uterus is anteverted. There is no evident pelvic mass. Other: Appendix appears normal. There is no abscess or ascites in the abdomen or pelvis. There is rectus muscle thinning in the anterior pelvis without frank herniation, stable. Musculoskeletal: There is degenerative change in the lumbar spine with disc space narrowing moderately severe at L2-3. 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 There are no blastic or lytic bone lesions. No intramuscular lesions are evident. IMPRESSION: 1. No evident bowel obstruction. No abscess in the abdomen or pelvis. Appendix appears normal. 2. No evident renal or ureteral calculus. No hydronephrosis. Urinary bladder wall thickness is normal. 3.  Hepatic steatosis.  Gallbladder absent. Electronically Signed   By: Lowella Grip III M.D.   On: 01/17/2019 07:59    DG Shoulder Left  Result Date: 04/17/2019 CLINICAL DATA:  48 year old female with left shoulder pain and tingling for 3 weeks after lifting injury. EXAM: LEFT SHOULDER - 2+ VIEW COMPARISON:  Portable chest 03/12/2004. FINDINGS: Degenerative spurring at the inferior glenoid. No glenohumeral joint  dislocation. Proximal left humerus intact. Underlying bone mineralization within normal limits. Left clavicle and scapula appear intact. No acute osseous abnormality identified. Negative visible left ribs. IMPRESSION: Degenerative changes at the inferior glenoid. No acute osseous abnormality identified about the left shoulder. Electronically Signed   By: Genevie Ann M.D.   On: 04/17/2019 09:24    Assessment & Plan:   Areatha was seen today for shoulder pain.  Diagnoses and all orders for this visit:  Acute pain of left shoulder- Plain films are positive for osteoarthritis.  She is not getting much symptom relief with ibuprofen and Tylenol so I recommended that she try a course of tramadol. -     DG Shoulder Left; Future -     traMADol (ULTRAM) 50 MG tablet; Take 1 tablet (50 mg total) by mouth every 6 (six) hours as needed.  Rotator cuff disorder, left- I have referred her for physical therapy to work on the left shoulder. -     DG Shoulder Left; Future -     Ambulatory referral to Physical Therapy  Lumbar radiculopathy, right -     traMADol (ULTRAM) 50 MG tablet; Take 1 tablet (50 mg total) by mouth every 6 (six) hours as needed.  Primary osteoarthritis involving multiple joints -     traMADol (ULTRAM) 50 MG tablet; Take 1 tablet (50 mg total) by mouth every 6 (six) hours as needed.   I am having Rockie Neighbours. Gorter start on traMADol. I am also having her maintain her potassium chloride, valACYclovir, omega-3 acid ethyl esters, Viibryd, telmisartan-hydrochlorothiazide, fluticasone, buPROPion, Synthroid, and Synthroid.  Meds ordered this encounter  Medications  . traMADol (ULTRAM) 50 MG tablet    Sig: Take 1 tablet (50 mg total) by mouth every 6 (six) hours as needed.    Dispense:  90 tablet    Refill:  5     Follow-up: Return in about 6 weeks (around 05/29/2019).  Scarlette Calico, MD

## 2019-05-02 ENCOUNTER — Other Ambulatory Visit: Payer: Self-pay | Admitting: Internal Medicine

## 2019-05-02 DIAGNOSIS — F325 Major depressive disorder, single episode, in full remission: Secondary | ICD-10-CM

## 2019-05-08 ENCOUNTER — Other Ambulatory Visit: Payer: Self-pay | Admitting: Internal Medicine

## 2019-05-08 DIAGNOSIS — E781 Pure hyperglyceridemia: Secondary | ICD-10-CM

## 2019-05-30 ENCOUNTER — Other Ambulatory Visit: Payer: Self-pay | Admitting: Internal Medicine

## 2019-05-30 DIAGNOSIS — I10 Essential (primary) hypertension: Secondary | ICD-10-CM

## 2019-06-05 ENCOUNTER — Telehealth: Payer: Self-pay | Admitting: Internal Medicine

## 2019-06-05 NOTE — Telephone Encounter (Signed)
States that she needs PT referral closed and removed.  States she is doing better and does not need.  States she went to have a hip injection and ins denied the inj due to have the open PT referral out there.

## 2019-08-02 ENCOUNTER — Ambulatory Visit (INDEPENDENT_AMBULATORY_CARE_PROVIDER_SITE_OTHER): Payer: 59 | Admitting: Internal Medicine

## 2019-08-02 ENCOUNTER — Encounter: Payer: Self-pay | Admitting: Internal Medicine

## 2019-08-02 ENCOUNTER — Other Ambulatory Visit: Payer: Self-pay

## 2019-08-02 VITALS — BP 134/84 | HR 70 | Temp 98.0°F | Resp 16 | Ht 62.0 in | Wt 211.0 lb

## 2019-08-02 DIAGNOSIS — E785 Hyperlipidemia, unspecified: Secondary | ICD-10-CM

## 2019-08-02 DIAGNOSIS — E781 Pure hyperglyceridemia: Secondary | ICD-10-CM

## 2019-08-02 DIAGNOSIS — M159 Polyosteoarthritis, unspecified: Secondary | ICD-10-CM | POA: Insufficient documentation

## 2019-08-02 DIAGNOSIS — M8949 Other hypertrophic osteoarthropathy, multiple sites: Secondary | ICD-10-CM

## 2019-08-02 DIAGNOSIS — E039 Hypothyroidism, unspecified: Secondary | ICD-10-CM

## 2019-08-02 DIAGNOSIS — I10 Essential (primary) hypertension: Secondary | ICD-10-CM

## 2019-08-02 LAB — LIPID PANEL
Cholesterol: 268 mg/dL — ABNORMAL HIGH (ref 0–200)
HDL: 59 mg/dL (ref >39.00)
LDL Cholesterol: 174 mg/dL — ABNORMAL HIGH (ref 0–99)
NonHDL: 208.8
Total CHOL/HDL Ratio: 5
Triglycerides: 173 mg/dL — ABNORMAL HIGH (ref 0.0–149.0)
VLDL: 34.6 mg/dL (ref 0.0–40.0)

## 2019-08-02 LAB — BASIC METABOLIC PANEL
BUN: 16 mg/dL (ref 6–23)
CO2: 28 mEq/L (ref 19–32)
Calcium: 8.4 mg/dL (ref 8.4–10.5)
Chloride: 102 mEq/L (ref 96–112)
Creatinine, Ser: 0.85 mg/dL (ref 0.40–1.20)
GFR: 70.76 mL/min (ref >60.00)
Glucose, Bld: 85 mg/dL (ref 70–99)
Potassium: 3.9 mEq/L (ref 3.5–5.1)
Sodium: 137 mEq/L (ref 135–145)

## 2019-08-02 LAB — CBC WITH DIFFERENTIAL/PLATELET
Basophils Absolute: 0 10*3/uL (ref 0.0–0.1)
Basophils Relative: 0.4 % (ref 0.0–3.0)
Eosinophils Absolute: 0.1 10*3/uL (ref 0.0–0.7)
Eosinophils Relative: 0.7 % (ref 0.0–5.0)
HCT: 39.5 % (ref 36.0–46.0)
Hemoglobin: 13.2 g/dL (ref 12.0–15.0)
Lymphocytes Relative: 29.8 % (ref 12.0–46.0)
Lymphs Abs: 2.7 10*3/uL (ref 0.7–4.0)
MCHC: 33.4 g/dL (ref 30.0–36.0)
MCV: 89.9 fl (ref 78.0–100.0)
Monocytes Absolute: 0.8 10*3/uL (ref 0.1–1.0)
Monocytes Relative: 9.1 % (ref 3.0–12.0)
Neutro Abs: 5.4 10*3/uL (ref 1.4–7.7)
Neutrophils Relative %: 60 % (ref 43.0–77.0)
Platelets: 348 10*3/uL (ref 150.0–400.0)
RBC: 4.4 Mil/uL (ref 3.87–5.11)
RDW: 14 % (ref 11.5–15.5)
WBC: 9.1 10*3/uL (ref 4.0–10.5)

## 2019-08-02 LAB — TSH: TSH: 11.85 u[IU]/mL — ABNORMAL HIGH (ref 0.35–4.50)

## 2019-08-02 MED ORDER — LEVOTHYROXINE SODIUM 100 MCG PO TABS: 100.0000 ug | ORAL_TABLET | Freq: Every day | ORAL | 0 refills | Status: DC

## 2019-08-02 MED ORDER — NABUMETONE 750 MG PO TABS: 750.0000 mg | ORAL_TABLET | Freq: Two times a day (BID) | ORAL | 1 refills | Status: DC | PRN

## 2019-08-02 NOTE — Patient Instructions (Signed)

## 2019-08-02 NOTE — Progress Notes (Signed)
Subjective:  Patient ID: Holly Mcmillan, female    DOB: [birth year]  Age: 49 y.o. MRN: [id number]  CC: Osteoarthritis, Hypothyroidism, Hyperlipidemia, and Hypertension  This visit occurred during the SARS-CoV-2 public health emergency.  Safety protocols were in place, including screening questions prior to the visit, additional usage of staff PPE, and extensive cleaning of exam room while observing appropriate contact time as indicated for disinfecting solutions.    HPI Holly Mcmillan presents for f/up -she continues to complain of pain in her large joints.  Mostly her hips and shoulders.  She is not getting much symptom relief with tramadol.    Outpatient Medications Prior to Visit  Medication Sig Dispense Refill  . buPROPion (WELLBUTRIN XL) 300 MG 24 hr tablet TAKE 1 TABLET BY MOUTH EVERY DAY 30 tablet 5  . estradiol (ESTRACE) 2 MG tablet Take 2-4 mg by mouth daily.    . fluticasone (FLONASE) 50 MCG/ACT nasal spray SPRAY 2 SPRAYS INTO EACH NOSTRIL EVERY DAY 16 mL 5  . omega-3 acid ethyl esters (LOVAZA) 1 g capsule TAKE 2 CAPSULES (2 G TOTAL) BY MOUTH 2 (TWO) TIMES DAILY. 120 capsule 5  . progesterone (PROMETRIUM) 100 MG capsule Take 100 mg by mouth at bedtime.    Marland Kitchen SYNTHROID 200 MCG tablet Take 1 tablet (200 mcg total) by mouth daily before breakfast. 90 tablet 1  . telmisartan-hydrochlorothiazide (MICARDIS HCT) 40-12.5 MG tablet TAKE 1 TABLET BY MOUTH EVERY DAY 30 tablet 3  . traMADol (ULTRAM) 50 MG tablet Take 1 tablet (50 mg total) by mouth every 6 (six) hours as needed. 90 tablet 5  . valACYclovir (VALTREX) 1000 MG tablet TAKE 2 TABLETS BY MOUTH 2 TIMES DAILY FOR 2 DOSES. 4 tablet 2  . VIIBRYD 40 MG TABS TAKE 1 TABLET BY MOUTH EVERY DAY 30 tablet 5  . SYNTHROID 50 MCG tablet Take 1 tablet (50 mcg total) by mouth daily before breakfast. 90 tablet 1  . potassium chloride (K-DUR) 10 MEQ tablet Take 3 tablets (30 mEq total) by mouth daily for 2 days. 6 tablet 0   No facility-administered  medications prior to visit.    ROS Review of Systems  Constitutional: Negative for diaphoresis, fatigue and unexpected weight change.  HENT: Negative.   Eyes: Negative for visual disturbance.  Respiratory: Negative for cough, chest tightness, shortness of breath and wheezing.   Cardiovascular: Negative for chest pain, palpitations and leg swelling.  Gastrointestinal: Negative for abdominal pain, constipation, diarrhea, nausea and vomiting.  Endocrine: Negative.  Negative for cold intolerance and heat intolerance.  Genitourinary: Negative for difficulty urinating.  Musculoskeletal: Positive for arthralgias. Negative for back pain, myalgias and neck pain.  Skin: Negative for color change.  Neurological: Negative.  Negative for dizziness, weakness and light-headedness.  Hematological: Negative for adenopathy. Does not bruise/bleed easily.  Psychiatric/Behavioral: Negative.     Objective:  BP 134/84 (BP Location: Left Arm, Patient Position: Sitting, Cuff Size: Large)   Pulse 70   Temp 98 F (36.7 C) (Oral)   Resp 16   Ht 5\' 2"  (1.575 m)   Wt 211 lb (95.7 kg)   SpO2 97%   BMI 38.59 kg/m   BP Readings from Last 3 Encounters:  08/02/19 134/84  04/17/19 130/84  03/22/19 138/88    Wt Readings from Last 3 Encounters:  08/02/19 211 lb (95.7 kg)  04/17/19 221 lb (100.2 kg)  03/22/19 225 lb (102.1 kg)    Physical Exam Vitals reviewed.  HENT:     Nose:  Nose normal.     Mouth/Throat:     Mouth: Mucous membranes are moist.  Eyes:     General: No scleral icterus.    Conjunctiva/sclera: Conjunctivae normal.  Cardiovascular:     Rate and Rhythm: Normal rate and regular rhythm.     Heart sounds: No murmur.  Pulmonary:     Effort: Pulmonary effort is normal.     Breath sounds: No stridor. No wheezing, rhonchi or rales.  Abdominal:     General: Abdomen is protuberant. Bowel sounds are normal. There is no distension.     Palpations: Abdomen is soft. There is no hepatomegaly,  splenomegaly or mass.     Tenderness: There is no abdominal tenderness.  Musculoskeletal:        General: Normal range of motion.     Cervical back: Neck supple.     Right lower leg: No edema.     Left lower leg: No edema.  Lymphadenopathy:     Cervical: No cervical adenopathy.  Skin:    General: Skin is warm and dry.     Coloration: Skin is not pale.  Neurological:     General: No focal deficit present.     Mental Status: She is alert.  Psychiatric:        Mood and Affect: Mood normal.        Behavior: Behavior normal.     Lab Results  Component Value Date   WBC 9.1 08/02/2019   HGB 13.2 08/02/2019   HCT 39.5 08/02/2019   PLT 348.0 08/02/2019   GLUCOSE 85 08/02/2019   CHOL 268 (H) 08/02/2019   TRIG 173.0 (H) 08/02/2019   HDL 59.00 08/02/2019   LDLDIRECT 173.0 10/25/2018   LDLCALC 174 (H) 08/02/2019   ALT 20 01/10/2019   AST 17 01/10/2019   NA 137 08/02/2019   K 3.9 08/02/2019   CL 102 08/02/2019   CREATININE 0.85 08/02/2019   BUN 16 08/02/2019   CO2 28 08/02/2019   TSH 11.85 (H) 08/02/2019   HGBA1C 5.5 03/16/2016    DG Shoulder Left  Result Date: 04/17/2019 CLINICAL DATA:  49 year old female with left shoulder pain and tingling for 3 weeks after lifting injury. EXAM: LEFT SHOULDER - 2+ VIEW COMPARISON:  Portable chest 03/12/2004. FINDINGS: Degenerative spurring at the inferior glenoid. No glenohumeral joint dislocation. Proximal left humerus intact. Underlying bone mineralization within normal limits. Left clavicle and scapula appear intact. No acute osseous abnormality identified. Negative visible left ribs. IMPRESSION: Degenerative changes at the inferior glenoid. No acute osseous abnormality identified about the left shoulder. Electronically Signed   By: Genevie Ann M.D.   On: 04/17/2019 09:24    Assessment & Plan:   Onida was seen today for osteoarthritis, hypothyroidism, hyperlipidemia and hypertension.  Diagnoses and all orders for this visit:  Essential  hypertension- Her blood pressure is adequately well controlled. -     CBC with Differential/Platelet; Future -     Basic metabolic panel; Future -     Basic metabolic panel -     CBC with Differential/Platelet  Acquired hypothyroidism- Her TSH remains elevated.  I recommended that she increase her dose of levothyroxine. - TSH; Future -     TSH -     levothyroxine (SYNTHROID) 100 MCG tablet; Take 1 tablet (100 mcg total) by mouth daily.  Hypertriglyceridemia, essential- Improvement noted. -     Lipid panel; Future -     Lipid panel  Hyperlipidemia with target LDL less than 100- She does  not have an elevated ASCVD risk score so I did not recommend a statin for CV risk reduction. -     Lipid panel; Future -     Lipid panel  Primary osteoarthritis involving multiple joints- I recommended that she add an NSAID to the tramadol. -     nabumetone (RELAFEN) 750 MG tablet; Take 1 tablet (750 mg total) by mouth 2 (two) times daily as needed.   I am having Rockie Neighbours. Yust start on nabumetone and levothyroxine. I am also having her maintain her potassium chloride, valACYclovir, fluticasone, buPROPion, Synthroid, traMADol, Viibryd, omega-3 acid ethyl esters, telmisartan-hydrochlorothiazide, progesterone, and estradiol.  Meds ordered this encounter  Medications  . nabumetone (RELAFEN) 750 MG tablet    Sig: Take 1 tablet (750 mg total) by mouth 2 (two) times daily as needed.    Dispense:  180 tablet    Refill:  1  . levothyroxine (SYNTHROID) 100 MCG tablet    Sig: Take 1 tablet (100 mcg total) by mouth daily.    Dispense:  90 tablet    Refill:  0     Follow-up: Return in about 6 months (around 02/01/2020).  Scarlette Calico, MD

## 2019-08-25 ENCOUNTER — Other Ambulatory Visit: Payer: Self-pay | Admitting: Internal Medicine

## 2019-08-25 DIAGNOSIS — F325 Major depressive disorder, single episode, in full remission: Secondary | ICD-10-CM

## 2019-09-04 ENCOUNTER — Ambulatory Visit (INDEPENDENT_AMBULATORY_CARE_PROVIDER_SITE_OTHER): Payer: 59

## 2019-09-04 ENCOUNTER — Encounter: Payer: Self-pay | Admitting: Internal Medicine

## 2019-09-04 ENCOUNTER — Other Ambulatory Visit: Payer: Self-pay

## 2019-09-04 ENCOUNTER — Ambulatory Visit (INDEPENDENT_AMBULATORY_CARE_PROVIDER_SITE_OTHER): Payer: 59 | Admitting: Internal Medicine

## 2019-09-04 VITALS — BP 124/84 | HR 82 | Temp 98.0°F | Resp 16 | Ht 62.0 in | Wt 211.5 lb

## 2019-09-04 DIAGNOSIS — M25511 Pain in right shoulder: Secondary | ICD-10-CM | POA: Diagnosis not present

## 2019-09-04 DIAGNOSIS — G8929 Other chronic pain: Secondary | ICD-10-CM

## 2019-09-04 DIAGNOSIS — E039 Hypothyroidism, unspecified: Secondary | ICD-10-CM

## 2019-09-04 DIAGNOSIS — E785 Hyperlipidemia, unspecified: Secondary | ICD-10-CM

## 2019-09-04 LAB — TSH: TSH: 3.08 u[IU]/mL (ref 0.35–4.50)

## 2019-09-04 NOTE — Progress Notes (Signed)
Subjective:  Patient ID: Holly Mcmillan, female    DOB: Sep 05, 1970  Age: 49 y.o. MRN: [id number]  CC: Hypothyroidism  This visit occurred during the SARS-CoV-2 public health emergency.  Safety protocols were in place, including screening questions prior to the visit, additional usage of staff PPE, and extensive cleaning of exam room while observing appropriate contact time as indicated for disinfecting solutions.   HPI [name] presents for f/up - She complains of a one month hx of non-traumatic right shoulder pain. The pain is posterior and she has a slight decrease in the ROM. Her current meds are not controlling the pain.  Outpatient Medications Prior to Visit  Medication Sig Dispense Refill  . buPROPion (WELLBUTRIN XL) 300 MG 24 hr tablet TAKE 1 TABLET BY MOUTH EVERY DAY 30 tablet 5  . estradiol (ESTRACE) 2 MG tablet Take 2-4 mg by mouth daily.    . fluticasone (FLONASE) 50 MCG/ACT nasal spray SPRAY 2 SPRAYS INTO EACH NOSTRIL EVERY DAY 16 mL 5  . levothyroxine (SYNTHROID) 100 MCG tablet Take 1 tablet (100 mcg total) by mouth daily. 90 tablet 0  . nabumetone (RELAFEN) 750 MG tablet Take 1 tablet (750 mg total) by mouth 2 (two) times daily as needed. 180 tablet 1  . omega-3 acid ethyl esters (LOVAZA) 1 g capsule TAKE 2 CAPSULES (2 G TOTAL) BY MOUTH 2 (TWO) TIMES DAILY. 120 capsule 5  . progesterone (PROMETRIUM) 100 MG capsule Take 100 mg by mouth at bedtime.    Marland Kitchen SYNTHROID 200 MCG tablet Take 1 tablet (200 mcg total) by mouth daily before breakfast. 90 tablet 1  . telmisartan-hydrochlorothiazide (MICARDIS HCT) 40-12.5 MG tablet TAKE 1 TABLET BY MOUTH EVERY DAY 30 tablet 3  . traMADol (ULTRAM) 50 MG tablet Take 1 tablet (50 mg total) by mouth every 6 (six) hours as needed. 90 tablet 5  . valACYclovir (VALTREX) 1000 MG tablet TAKE 2 TABLETS BY MOUTH 2 TIMES DAILY FOR 2 DOSES. 4 tablet 2  . VIIBRYD 40 MG TABS TAKE 1 TABLET BY MOUTH EVERY DAY 30 tablet 5  . potassium chloride (K-DUR) 10  MEQ tablet Take 3 tablets (30 mEq total) by mouth daily for 2 days. 6 tablet 0   No facility-administered medications prior to visit.    ROS Review of Systems  Constitutional: Negative.   HENT: Negative.   Eyes: Negative.   Respiratory: Negative for chest tightness, shortness of breath and wheezing.   Cardiovascular: Negative for chest pain and palpitations.  Gastrointestinal: Negative for abdominal pain, constipation, diarrhea, nausea and vomiting.  Endocrine: Negative for cold intolerance and heat intolerance.  Genitourinary: Negative.   Musculoskeletal: Positive for arthralgias. Negative for joint swelling and myalgias.  Skin: Negative.   Neurological: Negative.   Hematological: Negative.   Psychiatric/Behavioral: Negative.     Objective:  BP 124/84 (BP Location: Left Arm, Patient Position: Sitting, Cuff Size: Large)   Pulse 82   Temp 98 F (36.7 C) (Oral)   Resp 16   Ht 5\' 2"  (1.575 m)   Wt 211 lb 8 oz (95.9 kg)   SpO2 99%   BMI 38.68 kg/m   BP Readings from Last 3 Encounters:  09/04/19 124/84  08/02/19 134/84  04/17/19 130/84    Wt Readings from Last 3 Encounters:  09/04/19 211 lb 8 oz (95.9 kg)  08/02/19 211 lb (95.7 kg)  04/17/19 221 lb (100.2 kg)    Physical Exam Vitals reviewed.  Constitutional:      Appearance: Normal  appearance.  HENT:     Nose: Nose normal.     Mouth/Throat:     Mouth: Mucous membranes are moist.  Eyes:     General: No scleral icterus.    Conjunctiva/sclera: Conjunctivae normal.  Cardiovascular:     Rate and Rhythm: Normal rate and regular rhythm.     Heart sounds: No murmur.  Pulmonary:     Effort: Pulmonary effort is normal.     Breath sounds: No stridor. No wheezing, rhonchi or rales.  Abdominal:     General: Abdomen is protuberant. Bowel sounds are normal. There is no distension.     Palpations: Abdomen is soft. There is no hepatomegaly, splenomegaly or mass.     Tenderness: There is no abdominal tenderness.   Musculoskeletal:        General: Normal range of motion.     Right shoulder: Tenderness and bony tenderness present. No swelling, deformity or effusion. Normal range of motion. Normal strength.     Left shoulder: Normal.       Arms:     Cervical back: Neck supple.     Right lower leg: No edema.     Left lower leg: No edema.  Lymphadenopathy:     Cervical: No cervical adenopathy.  Skin:    General: Skin is warm and dry.  Neurological:     General: No focal deficit present.     Mental Status: She is alert.     Lab Results  Component Value Date   WBC 9.1 08/02/2019   HGB 13.2 08/02/2019   HCT 39.5 08/02/2019   PLT 348.0 08/02/2019   GLUCOSE 85 08/02/2019   CHOL 268 (H) 08/02/2019   TRIG 173.0 (H) 08/02/2019   HDL 59.00 08/02/2019   LDLDIRECT 173.0 10/25/2018   LDLCALC 174 (H) 08/02/2019   ALT 20 01/10/2019   AST 17 01/10/2019   NA 137 08/02/2019   K 3.9 08/02/2019   CL 102 08/02/2019   CREATININE 0.85 08/02/2019   BUN 16 08/02/2019   CO2 28 08/02/2019   TSH 3.08 09/04/2019   HGBA1C 5.5 03/16/2016    DG Shoulder Left  Result Date: 04/17/2019 CLINICAL DATA:  49 year old female with left shoulder pain and tingling for 3 weeks after lifting injury. EXAM: LEFT SHOULDER - 2+ VIEW COMPARISON:  Portable chest 03/12/2004. FINDINGS: Degenerative spurring at the inferior glenoid. No glenohumeral joint dislocation. Proximal left humerus intact. Underlying bone mineralization within normal limits. Left clavicle and scapula appear intact. No acute osseous abnormality identified. Negative visible left ribs. IMPRESSION: Degenerative changes at the inferior glenoid. No acute osseous abnormality identified about the left shoulder. Electronically Signed   By: Genevie Ann M.D.   On: 04/17/2019 09:24    DG Shoulder Right  Result Date: 09/04/2019 CLINICAL DATA:  49 year old female with right shoulder pain. EXAM: RIGHT SHOULDER - 2+ VIEW COMPARISON:  Left shoulder radiograph dated 04/17/2019.  FINDINGS: There is no acute fracture or dislocation. The bones are osteopenic. No significant arthritic changes. The soft tissues are unremarkable. IMPRESSION: Negative. Electronically Signed   By: Anner Crete M.D.   On: 09/04/2019 23:55    Assessment & Plan:   Sissy was seen today for hypothyroidism.  Diagnoses and all orders for this visit:  Acquired hypothyroidism- Her TSH is in the normal range. She will remain on the current T4 dose. -     TSH; Future -     TSH  Chronic right shoulder pain- Plain films are normal. I am concerned she  has a rotator cuff disorder and have asked her to see sports med. -     DG Shoulder Right; Future -     Ambulatory referral to Sports Medicine   I have discontinued Othell Fornash. [place]'s potassium chloride. I am also having her maintain her valACYclovir, fluticasone, Synthroid, traMADol, Viibryd, omega-3 acid ethyl esters, telmisartan-hydrochlorothiazide, progesterone, estradiol, nabumetone, levothyroxine, and buPROPion.  No orders of the defined types were placed in this encounter.    Follow-up: Return in about 3 months (around 12/05/2019).  Scarlette Calico, MD

## 2019-09-04 NOTE — Patient Instructions (Signed)
Shoulder Pain Many things can cause shoulder pain, including:  An injury to the shoulder.  Overuse of the shoulder.  Arthritis. The source of the pain can be:  Inflammation.  An injury to the shoulder joint.  An injury to a tendon, ligament, or bone. Follow these instructions at home: Pay attention to changes in your symptoms. Let your health care provider know about them. Follow these instructions to relieve your pain. If you have a sling:  Wear the sling as told by your health care provider. Remove it only as told by your health care provider.  Loosen the sling if your fingers tingle, become numb, or turn cold and blue.  Keep the sling clean.  If the sling is not waterproof: ? Do not let it get wet. Remove it to shower or bathe.  Move your arm as little as possible, but keep your hand moving to prevent swelling. Managing pain, stiffness, and swelling   If directed, put ice on the painful area: ? Put ice in a plastic bag. ? Place a towel between your skin and the bag. ? Leave the ice on for 20 minutes, 2-3 times per day. Stop applying ice if it does not help with the pain.  Squeeze a soft ball or a foam pad as much as possible. This helps to keep the shoulder from swelling. It also helps to strengthen the arm. General instructions  Take over-the-counter and prescription medicines only as told by your health care provider.  Keep all follow-up visits as told by your health care provider. This is important. Contact a health care provider if:  Your pain gets worse.  Your pain is not relieved with medicines.  New pain develops in your arm, hand, or fingers. Get help right away if:  Your arm, hand, or fingers: ? Tingle. ? Become numb. ? Become swollen. ? Become painful. ? Turn white or blue. Summary  Shoulder pain can be caused by an injury, overuse, or arthritis.  Pay attention to changes in your symptoms. Let your health care provider know about  them.  This condition may be treated with a sling, ice, and pain medicines.  Contact your health care provider if the pain gets worse or new pain develops. Get help right away if your arm, hand, or fingers tingle or become numb, swollen, or painful.  Keep all follow-up visits as told by your health care provider. This is important. This information is not intended to replace advice given to you by your health care provider. Make sure you discuss any questions you have with your health care provider. Document Revised: 11/02/2017 Document Reviewed: 11/02/2017 Elsevier Patient Education  2020 [employer]  

## 2019-09-05 ENCOUNTER — Encounter: Payer: Self-pay | Admitting: Internal Medicine

## 2019-09-15 ENCOUNTER — Other Ambulatory Visit: Payer: Self-pay

## 2019-09-15 ENCOUNTER — Ambulatory Visit: Payer: Self-pay

## 2019-09-15 ENCOUNTER — Ambulatory Visit (INDEPENDENT_AMBULATORY_CARE_PROVIDER_SITE_OTHER): Payer: 59 | Admitting: Family Medicine

## 2019-09-15 ENCOUNTER — Encounter: Payer: Self-pay | Admitting: Family Medicine

## 2019-09-15 VITALS — BP 108/78 | HR 75 | Ht 62.0 in | Wt 210.2 lb

## 2019-09-15 DIAGNOSIS — M25511 Pain in right shoulder: Secondary | ICD-10-CM | POA: Diagnosis not present

## 2019-09-15 DIAGNOSIS — M75111 Incomplete rotator cuff tear or rupture of right shoulder, not specified as traumatic: Secondary | ICD-10-CM | POA: Insufficient documentation

## 2019-09-15 MED ORDER — NITROGLYCERIN 0.2 MG/HR TD PT24: MEDICATED_PATCH | TRANSDERMAL | 1 refills | Status: DC

## 2019-09-15 NOTE — Patient Instructions (Addendum)
Thank you for coming in today.  Use the nitropatches.   Nitroglycerin Protocol   Apply 1/4 nitroglycerin patch to affected area daily.  Change position of patch within the affected area every 24 hours.  You may experience a headache during the first 1-2 weeks of using the patch, these should subside.  If you experience headaches after beginning nitroglycerin patch treatment, you may take your preferred over the counter pain reliever.  Another side effect of the nitroglycerin patch is skin irritation or rash related to patch adhesive.  Please notify our office if you develop more severe headaches or rash, and stop the patch.  Tendon healing with nitroglycerin patch may require 12 to 24 weeks depending on the extent of injury.  Men should not use if taking Viagra, Cialis, or Levitra.   Do not use if you have migraines or rosacea.     Attend PT.   Recheck in 4-6 weeks especially if not improved.  Could do an injection sooner.  Keep me updated.    Rotator Cuff Tear  A rotator cuff tear is a partial or complete tear of the cord-like bands (tendons) that connect muscle to bone in the rotator cuff. The rotator cuff is a group of muscles and tendons that surround the shoulder joint and keep the upper arm bone (humerus) in the shoulder socket. The tear can occur suddenly (acute tear) or can develop over a long period of time (chronic tear). What are the causes? Acute tears may be caused by:  A fall, especially on an outstretched arm.  Lifting very heavy objects with a jerking motion. Chronic tears may be caused by overuse of the muscles. This may happen in sports, physical work, or activities in which your arm repeatedly moves over your head. What increases the risk? This condition is more likely to occur in:  Athletes and workers who frequently use their shoulder or reach over their heads. This may include activities such as: ? Tennis. ? Baseball and softball. ? Swimming  and rowing. ? Weightlifting. ? Architect work. ? Painting.  People who smoke.  Older people who have arthritis or poor blood supply. These can make the muscles and tendons weaker. What are the signs or symptoms? Symptoms of this condition depend on the type and severity of the injury:  An acute tear may include a sudden tearing feeling, followed by severe pain that goes from your upper shoulder, down your arm, and toward your elbow.  A chronic tear includes a gradual weakness and decreased shoulder motion as the pain gets worse. The pain is usually worse at night. Both types may have symptoms such as:  Pain that spreads (radiates) from the shoulder to the upper arm.  Swelling and tenderness in front of the shoulder.  Decreased range of motion.  Pain when: ? Reaching, pulling, or lifting the arm above the head. ? Lowering the arm from above the head.  Not being able to raise your arm out to the side.  Difficulty placing the arm behind your back. How is this diagnosed? This condition is diagnosed with a medical history and physical exam. Imaging tests may also be done, including:  X-rays.  MRI.  Ultrasound.  CT or MR arthrogram. During this test, a contrast material is injected into your shoulder and then images are taken. How is this treated? Treatment for this condition depends on the type and severity of the condition. In less severe cases, treatment may include:  Rest. This may be done with  a sling that holds the shoulder still (immobilization). Your health care provider may also recommend avoiding activities that involve lifting your arm over your head.  Icing the shoulder.  Anti-inflammatory medicines, such as aspirin or ibuprofen.  Strengthening and stretching exercises. Your health care provider may recommend specific exercises to improve your range of motion and strengthen your shoulder. In more severe cases, treatment may include:  Physical  therapy.  Steroid injections.  Surgery. Follow these instructions at home: Managing pain, stiffness, and swelling  If directed, put ice on the injured area. ? If you have a removable sling, remove it as told by your health care provider. ? Put ice in a plastic bag. ? Place a towel between your skin and the bag. ? Leave the ice on for 20 minutes, 2-3 times a day.  Raise (elevate) the injured area above the level of your heart while you are lying down.  Find a comfortable sleeping position or sleep on a recliner, if available.  Move your fingers often to avoid stiffness and to lessen swelling.  Once the swelling has gone down, your health care provider may direct you to apply heat to relax the muscles. Use the heat source that your health care provider recommends, such as a moist heat pack or a heating pad. ? Place a towel between your skin and the heat source. ? Leave the heat on for 20-30 minutes. ? Remove the heat if your skin turns bright red. This is especially important if you are unable to feel pain, heat, or cold. You may have a greater risk of getting burned. If you have a sling:  Wear the sling as told by your health care provider. Remove it only as told by your health care provider.  Loosen the sling if your fingers tingle, become numb, or turn cold and blue.  Keep the sling clean.  If the sling is not waterproof: ? Do not let it get wet. ? Cover it with a watertight covering when you take a bath or a shower. Driving  Do not drive or use heavy machinery while taking prescription pain medicine.  Ask your health care provider when it is safe to drive if you have a sling on your arm. Activity  Rest your shoulder as told by your health care provider.  Return to your normal activities as told by your health care provider. Ask your health care provider what activities are safe for you.  Do any exercises or stretches as told by your health care provider. General  instructions  Do not use any products that contain nicotine or tobacco, such as cigarettes and e-cigarettes. If you need help quitting, ask your health care provider.  Take over-the-counter and prescription medicines only as told by your health care provider.  Keep all follow-up visits as told by your health care provider. This is important. Contact a health care provider if:  Your pain gets worse.  You have new pain in your arm, hands, or fingers.  Medicine does not help your pain. Get help right away if:  Your arm, hand, or fingers are numb or tingling.  Your arm, hand, or fingers are swollen or painful or they turn white or blue.  Your hand or fingers on your injured arm are colder than your other hand. Summary  A rotator cuff tear is a partial or complete tear of the cord-like bands (tendons) that connect muscle to bone in the rotator cuff.  The tear can occur suddenly (acute tear)  or can develop over a long period of time (chronic tear).  Treatment generally includes rest, anti-inflammatory medicines, and icing. In some cases, physical therapy and steroid injections may be needed. In severe cases, surgery may be needed. This information is not intended to replace advice given to you by your health care provider. Make sure you discuss any questions you have with your health care provider. Document Revised: 04/02/2017 Document Reviewed: 07/06/2016 Elsevier Patient Education  [place].

## 2019-09-15 NOTE — Progress Notes (Signed)
Subjective:    I'm seeing this patient as a consultation for:  Dr. Ronnald Ramp. Note will be routed back to referring provider/PCP.  CC: R shoulder  I, Wendy Poet, LAT, ATC, am serving as scribe for Dr. [name].  HPI: Pt is a 49 y/o female presenting w/ c/o R posterior shoulder pain x one month w/ no known MOI.  She rates her pain as severe at it's worst and mild at rest and describes her pain as sharp. 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 She also notes that she has been increasing her activity recently. She has been painting her house doing home renovations. Additionally she started doing home exercises with calisthenics at home. Both of these 2 things are causing her shoulder pain. She denies any injury history.  Radiating pain: No R shoulder mechanical symptoms: No R UE numbness/tingling: No R UE weakness: Yes Aggravating factors: picking anything up w/ her R arm/hand; overhead AROM Treatments tried: ice; Tramadol;  Diagnostic testing: R shoulder XR- 09/04/19  Past medical history, Surgical history, Family history, Social history, Allergies, and medications have been entered into the medical record, reviewed.   Review of Systems: No new headache, visual changes, nausea, vomiting, diarrhea, constipation, dizziness, abdominal pain, skin rash, fevers, chills, night sweats, weight loss, swollen lymph nodes, body aches, joint swelling, muscle aches, chest pain, shortness of breath, mood changes, visual or auditory hallucinations.   Objective:    Vitals:   09/15/19 0857  BP: 108/78  Pulse: 75  SpO2: 98%   General: Well Developed, well nourished, and in no acute distress.  Neuro/Psych: Alert and oriented x3, extra-ocular muscles intact, able to move all 4 extremities, sensation grossly intact. Skin: Warm and dry, no rashes noted.  Respiratory: Not using accessory muscles, speaking in full sentences, trachea midline.  Cardiovascular: Pulses palpable, no extremity  edema. Abdomen: Does not appear distended. MSK:  C-spine: Normal-appearing nontender normal cervical motion.  Upper extremity strength is intact except noted above. Right shoulder normal-appearing not particularly tender to palpation. Range of motion full abduction external rotation and internal rotation. Some pain with abduction arc. Strength 4/5 abduction and external rotation. 5/5 internal rotation. Positive empty can test. Positive Hawkins test negative Neer's test. Negative Yergason's and speeds test.  Lab and Radiology Results DG Shoulder Right  Result Date: 09/04/2019 CLINICAL DATA:  49 year old female with right shoulder pain. EXAM: RIGHT SHOULDER - 2+ VIEW COMPARISON:  Left shoulder radiograph dated 04/17/2019. FINDINGS: There is no acute fracture or dislocation. The bones are osteopenic. No significant arthritic changes. The soft tissues are unremarkable. IMPRESSION: Negative. Electronically Signed   By: Anner Crete M.D.   On: 09/04/2019 23:55   I, [name], personally (independently) visualized and performed the interpretation of the images attached in this note.  Diagnostic Limited MSK Ultrasound of: Right shoulder Biceps tendon in bicipital groove. Possible linear split tear present proximal long head biceps tendon. Nontender with palpation with ultrasound probe. No surrounding hypoechoic change. Subscapularis tendon intact normal-appearing Supraspinatus tendon with anechoic streak at bursal surface of supraspinatus tendon extending about part way through the mid substance of the tendon without significant gap. Consistent in appearance with partial rotator cuff tear. Slight Doppler activity increased around the anechoic change but not within the anechoic change. No gapping or retraction with dynamic motion examination. Subacromial bursa thickness normal-appearing Infraspinatus tendon normal-appearing AC joint largely normal-appearing Impression: Partial-thickness bursal  sided supraspinatus tear. No retraction.   Impression and Recommendations:    Assessment  and Plan: 49 y.o. female with right shoulder pain ongoing for about a month after increasing activity. Physical exam consistent with rotator cuff tendinopathy/subacromial bursitis. Ultrasound examination consistent with partial nonretracted bursal surface supraspinatus tear. Possible this is more of an ultrasound artifact than a true tear. Will treat with nitroglycerin patch protocol physical therapy and home exercise program. Discussed possibly of injection. Would like to avoid injection if there is a possibility of rotator cuff tear as this may delay healing. However if pain is not controllable and interfering with ability to do physical therapy injection would be reasonable. MRI if just not better..   Orders Placed This Encounter  Procedures  . [place] LIMITED JOINT SPACE STRUCTURES UP RIGHT(NO LINKED CHARGES)    Order Specific Question:   Reason for Exam (SYMPTOM  OR DIAGNOSIS REQUIRED)    Answer:   R shoulder pain    Order Specific Question:   Preferred imaging location?    Answer:   [place]  . Ambulatory referral to Physical Therapy    Referral Priority:   Routine    Referral Type:   Physical Medicine    Referral Reason:   Specialty Services Required    Requested Specialty:   Physical Therapy   Meds ordered this encounter  Medications  . nitroGLYCERIN (NITRODUR - DOSED IN MG/24 HR) 0.2 mg/hr patch    Sig: Apply 1/4 patch daily to tendon for tendonitis.    Dispense:  30 patch    Refill:  1    Discussed warning signs or symptoms. Please see discharge instructions. Patient expresses understanding.   The above documentation has been reviewed and is accurate and complete [name], M.D.

## 2019-09-17 ENCOUNTER — Other Ambulatory Visit: Payer: Self-pay | Admitting: Internal Medicine

## 2019-09-17 DIAGNOSIS — E039 Hypothyroidism, unspecified: Secondary | ICD-10-CM

## 2019-09-26 ENCOUNTER — Other Ambulatory Visit: Payer: Self-pay

## 2019-09-26 ENCOUNTER — Ambulatory Visit: Payer: 59 | Attending: Family Medicine

## 2019-09-26 DIAGNOSIS — G8929 Other chronic pain: Secondary | ICD-10-CM | POA: Insufficient documentation

## 2019-09-26 DIAGNOSIS — R293 Abnormal posture: Secondary | ICD-10-CM | POA: Diagnosis present

## 2019-09-26 DIAGNOSIS — M25511 Pain in right shoulder: Secondary | ICD-10-CM | POA: Insufficient documentation

## 2019-09-26 DIAGNOSIS — R252 Cramp and spasm: Secondary | ICD-10-CM | POA: Insufficient documentation

## 2019-09-26 NOTE — Therapy (Signed)
[facility] [address] [place], [place], [address] Phone: [phone]   Fax:  [phone]  Physical Therapy Evaluation  Patient Details  Name: Holly Mcmillan MRN: [id number] Date of Birth: Jul 21, 1970 Referring Provider (PT): [name], MD   Encounter Date: 09/26/2019  PT End of Session - 09/26/19 1226    Visit Number  1    Date for PT Re-Evaluation  11/21/19    PT Start Time  1149    PT Stop Time  1225    PT Time Calculation (min)  36 min    Activity Tolerance  Patient tolerated treatment well    Behavior During Therapy  [facility] for tasks assessed/performed       Past Medical History:  Diagnosis Date  . Depression   . Dysmenorrhea   . Gallbladder disease   . Headache(784.0)   . Hypertension   . Hypothyroidism   . Obesity   . Ovarian cyst     Past Surgical History:  Procedure Laterality Date  . ablation    . BREAST CYST EXCISION Right 06/24/2016   Procedure: EXCISION RIGHT BREAST CYST;  Surgeon: Coralie Keens, MD;  Location: [place];  Service: General;  Laterality: Right;  . CESAREAN SECTION  2002, 2009  . CHOLECYSTECTOMY  2005  . NASAL SINUS SURGERY    . THYROID SURGERY  2012    There were no vitals filed for this visit.   Subjective Assessment - 09/26/19 1154    Subjective  Pt is a Rt hand dominant female who presents to PT with complaints of Rt shoulder pain lasting 2 months.  Pt had been painting a lot at her house and it was around this time that her shoulder started hurting.  Diagnosis of partial thikness tear of Rt supraspinatus at [facility].    Pertinent History  none    Currently in Pain?  Yes    Pain Score  1    up to 5-8/10   Pain Location  Shoulder    Pain Orientation  Right    Pain Descriptors / Indicators  Shooting    Pain Type  Chronic pain    Pain Onset  More than a month ago    Pain Frequency  Intermittent    Aggravating Factors   lifting, use of Rt UE, reaching  out to the side, pulling across the body    Pain Relieving Factors  nothing         [facility] PT Assessment - 09/26/19 0001      Assessment   Medical Diagnosis  acute pain of Rt shoulder     Referring Provider (PT)  [name], MD    Onset Date/Surgical Date  07/27/19    Hand Dominance  Right    Next MD Visit  10/20/19      Precautions   Precautions  None      Restrictions   Weight Bearing Restrictions  No      Balance Screen   Has the patient fallen in the past 6 months  Yes    How many times?  1   coming down a ladder- no balance deficits   Has the patient had a decrease in activity level because of a fear of falling?   No    Is the patient reluctant to leave their home because of a fear of falling?   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[employer]     Leisure  aerobics, walking for exercise      Cognition   Overall Cognitive Status  Within Functional Limits for tasks assessed      Observation/Other Assessments   Focus on Therapeutic Outcomes (FOTO)   48% limitation      Posture/Postural Control   Posture/Postural Control  Postural limitations    Postural Limitations  Forward head;Rounded Shoulders      ROM / Strength   AROM / PROM / Strength  AROM;Strength      AROM   Overall AROM   Within functional limits for tasks performed    Overall AROM Comments  full Rt shoulder strength with pain at end range abduction and ER       Strength   Overall Strength  Within functional limits for tasks performed    Overall Strength Comments  4+/5 to 5/5 in bil UEs.  Pain in Rt ERs with resisted testing      Palpation   Palpation comment  palpable tenderness over Rt external rotators and posterior glenohumeral joint capsule in addition to subacromial region.      Ambulation/Gait   Gait Pattern  Within Functional Limits                   Objective measurements completed on examination: See above findings.              PT Education - 09/26/19 1220    Education Details  Access Code: [email or web address]    Person(s) Educated  Patient    Methods  Explanation;Demonstration;Handout    Comprehension  Verbalized understanding;Returned demonstration       PT Short Term Goals - 09/26/19 1227      PT SHORT TERM GOAL #1   Title  be independent in initial HEP    Time  4    Period  Weeks    Status  New    Target Date  10/24/19      PT SHORT TERM GOAL #2   Title  report < or = to 4/10 Rt shoulder pain with use with ADLs and self-care    Baseline  --    Time  4    Period  Weeks    Status  New    Target Date  10/24/19      PT SHORT TERM GOAL #3   Title  ---      PT SHORT TERM GOAL #4   Title  ----        PT Long Term Goals - 09/26/19 1227      PT LONG TERM GOAL #1   Title  be independent in advanced HEP    Baseline  --    Time  8    Period  Weeks    Status  New    Target Date  11/21/19      PT LONG TERM GOAL #2   Title  reduce FOTO to < or = to 30% limitation    Baseline  --    Time  8    Period  Weeks    Status  New    Target Date  11/21/19      PT LONG TERM GOAL #3   Title  report a 75% reduction in Rt shoulder pain with functional use    Baseline  --  Time  8    Period  Weeks    Status  New    Target Date  11/21/19      PT LONG TERM GOAL #4   Title  report postural corrections during the work day to improve postural strength and scapular alignment    Time  8    Period  Weeks    Status  New    Target Date  11/21/19      PT LONG TERM GOAL #5   Title  report sleeping all night due to reduce pain with pulling covers with the Rt UE    Time  8    Period  Weeks    Status  New    Target Date  11/21/19             Plan - 09/26/19 1300    Clinical Impression Statement  Pt is a Rt hand dominant female who presents to PT with complaints of Rt shoulder pain x 1.5  months without incident or injury.  Diagnostic ultrasound done at [facility] showed partial thickness bursal sided supraspinatus tear without retraction.  Pt is being treated with nitroglycerin patches.  Pt reports up to 5-8/10 Rt shoulder pain with use especially with pulling the covers over in bed and reaching out to the side.  Pt demonstrates full Rt shoulder A/ROM with pain at end range flexion and abduction.  Pt with forward head and rounded shoulder posture. Pt with palpable tenderness over Rt posterior joint capsule and external rotators and subacromial region.  Pt will benefit from skilled PT to address Rt shoulder pain with functional use.    Examination-Activity Limitations  Carry;Lift;Sleep    Examination-Participation Restrictions  Laundry    Stability/Clinical Decision Making  Stable/Uncomplicated    Clinical Decision Making  Low    Rehab Potential  Excellent    PT Frequency  1x / week    PT Duration  8 weeks    PT Treatment/Interventions  ADLs/Self Care Home Management;Cryotherapy;Electrical Stimulation;Moist Heat;Iontophoresis 4mg /ml Dexamethasone;Therapeutic exercise;Patient/family education;Manual techniques;Dry needling;Passive range of motion;Taping    PT Next Visit Plan  Postural strength to improve scapular alignment, rotator cuff strength, work overhead with light weights    PT Home Exercise Plan  Access Code: [email or web address]    Consulted and Agree with Plan of Care  Patient       Patient will benefit from skilled therapeutic intervention in order to improve the following deficits and impairments:  Decreased activity tolerance, Postural dysfunction, Improper body mechanics, Pain, Increased muscle spasms  Visit Diagnosis: Chronic right shoulder pain - Plan: PT plan of care cert/re-cert  Cramp and spasm - Plan: PT plan of care cert/re-cert  Abnormal posture - Plan: PT plan of care cert/re-cert     Problem List Patient Active Problem List   Diagnosis Date Noted  . Nontraumatic  incomplete tear of right rotator cuff 09/15/2019  . Chronic right shoulder pain 09/04/2019  . Primary osteoarthritis involving multiple joints 08/02/2019  . Rotator cuff disorder, left 04/17/2019  . Irritable bowel syndrome with diarrhea 01/10/2019  . Lumbar radiculopathy, right 07/06/2017  . Eczema of face 05/26/2017  . Routine general medical examination at a health care facility 03/16/2016  . RLS (restless legs syndrome) 03/16/2016  . Hypertriglyceridemia, essential 04/04/2015  . Hyperlipidemia with target LDL less than 100 04/04/2015  . Severe obesity (BMI >= 40) ([place]) 09/20/2013  . Hyperglycemia 09/30/2011  . Hypothyroidism 07/09/2010  . Allergic rhinitis due to pollen 06/30/2010  . TOBACCO USE  11/28/2009  . Depression, major, in remission ([place]) 11/28/2009  . Essential hypertension 11/28/2009    Sigurd Sos, PT 09/26/19 1:13 PM  [facility] [address] [place], [place], [address] Phone: [phone]   Fax:  [phone]  Name: Holly Mcmillan MRN: [id number] Date of Birth: [birth year]

## 2019-09-26 NOTE — Patient Instructions (Signed)
Access Code: [email or web address] URL: [email or web address] Date: 09/26/2019 Prepared by: Claiborne Billings  Exercises Standing Shoulder Posterior Capsule Stretch - 3 x daily - 7 x weekly - 1 sets - 3 reps - 20 hold Standing Shoulder Flexion with Posterior Anchored Resistance - 2 x daily - 7 x weekly - 10 reps - 2 sets Single Arm Shoulder Extension with Anchored Resistance - 2 x daily - 7 x weekly - 10 reps - 2 sets Shoulder Internal Rotation with Resistance - 2 x daily - 7 x weekly - 10 reps - 2 sets Shoulder External Rotation with Anchored Resistance - 2 x daily - 7 x weekly - 10 reps - 2 sets Seated Correct Posture - 1 x daily - 7 x weekly - 10 reps - 3 sets Seated Scapular Retraction - 5 x daily - 7 x weekly - 10 reps - 1 sets - 5 hold Seated Correct Posture - 1 x daily - 7 x weekly - 10 reps - 3 sets

## 2019-10-05 ENCOUNTER — Ambulatory Visit: Payer: 59 | Attending: Family Medicine

## 2019-10-05 ENCOUNTER — Other Ambulatory Visit: Payer: Self-pay

## 2019-10-05 DIAGNOSIS — R252 Cramp and spasm: Secondary | ICD-10-CM | POA: Insufficient documentation

## 2019-10-05 DIAGNOSIS — G8929 Other chronic pain: Secondary | ICD-10-CM | POA: Insufficient documentation

## 2019-10-05 DIAGNOSIS — R293 Abnormal posture: Secondary | ICD-10-CM | POA: Insufficient documentation

## 2019-10-05 DIAGNOSIS — M25511 Pain in right shoulder: Secondary | ICD-10-CM | POA: Diagnosis present

## 2019-10-05 NOTE — Therapy (Signed)
[facility] [address], [place], [place], [address] Phone: [phone]   Fax:  [phone]  Physical Therapy Treatment  Patient Details  Name: Holly Mcmillan MRN: [id number] Date of Birth: 1970/10/28 Referring Provider (PT): [name], MD   Encounter Date: 10/05/2019  PT End of Session - 10/05/19 1132    Visit Number  2    Date for PT Re-Evaluation  11/21/19    PT Start Time  1102    PT Stop Time  1135    PT Time Calculation (min)  33 min    Activity Tolerance  Patient tolerated treatment well    Behavior During Therapy  [facility] for tasks assessed/performed       Past Medical History:  Diagnosis Date  . Depression   . Dysmenorrhea   . Gallbladder disease   . Headache(784.0)   . Hypertension   . Hypothyroidism   . Obesity   . Ovarian cyst     Past Surgical History:  Procedure Laterality Date  . ablation    . BREAST CYST EXCISION Right 06/24/2016   Procedure: EXCISION RIGHT BREAST CYST;  Surgeon: Coralie Keens, MD;  Location: [place];  Service: General;  Laterality: Right;  . CESAREAN SECTION  2002, 2009  . CHOLECYSTECTOMY  2005  . NASAL SINUS SURGERY    . THYROID SURGERY  2012    There were no vitals filed for this visit.  Subjective Assessment - 10/05/19 1106    Subjective  I have been doing my exercises.  It still hurts to pick something up with the Rt UE.    Currently in Pain?  Yes    Pain Score  3    up to 7/10 with use   Pain Location  Shoulder    Pain Orientation  Right    Pain Descriptors / Indicators  Sharp;Aching    Pain Type  Chronic pain    Pain Onset  More than a month ago    Pain Frequency  Intermittent    Aggravating Factors   lifting, use of the Rt UE, reaching out to the side    Pain Relieving Factors  nothing.  Stop the aggravating activity                        [facility] Adult PT Treatment/Exercise - 10/05/19 0001      Exercises   Exercises   Shoulder      Shoulder Exercises: Seated   Flexion  Strengthening;Right;20 reps;Weights    Flexion Weight (lbs)  1    Diagonals  Strengthening;Right;20 reps    Diagonals Weight (lbs)  1      Shoulder Exercises: Sidelying   External Rotation  Strengthening;Right;20 reps    External Rotation Weight (lbs)  1    ABduction  Strengthening;Right;20 reps    ABduction Weight (lbs)  1      Shoulder Exercises: Standing   External Rotation  Strengthening;Right;20 reps;Theraband    Theraband Level (Shoulder External Rotation)  Level 2 (Red)    Internal Rotation  Strengthening;Right;20 reps;Theraband    Flexion  Strengthening;Right;20 reps;Theraband    Theraband Level (Shoulder Flexion)  Level 2 (Red)    Extension  Right;Strengthening;20 reps;Theraband    Theraband Level (Shoulder Extension)  Level 2 (Red)    Other Standing Exercises  wall push ups 2x10      Shoulder Exercises: ROM/Strengthening   UBE (Upper Arm Bike)  Level 1x 6 minutes (  3/3)   PT present to discuss progress            PT Education - 10/05/19 1131    Education Details  Access Code: [email or web address]    Person(s) Educated  Patient    Methods  Explanation;Demonstration;Handout    Comprehension  Verbalized understanding;Returned demonstration       PT Short Term Goals - 09/26/19 1227      PT SHORT TERM GOAL #1   Title  be independent in initial HEP    Time  4    Period  Weeks    Status  New    Target Date  10/24/19      PT SHORT TERM GOAL #2   Title  report < or = to 4/10 Rt shoulder pain with use with ADLs and self-care    Baseline  --    Time  4    Period  Weeks    Status  New    Target Date  10/24/19      PT SHORT TERM GOAL #3   Title  ---      PT SHORT TERM GOAL #4   Title  ----        PT Long Term Goals - 09/26/19 1227      PT LONG TERM GOAL #1   Title  be independent in advanced HEP    Baseline  --    Time  8    Period  Weeks    Status  New    Target Date  11/21/19      PT LONG TERM GOAL  #2   Title  reduce FOTO to < or = to 30% limitation    Baseline  --    Time  8    Period  Weeks    Status  New    Target Date  11/21/19      PT LONG TERM GOAL #3   Title  report a 75% reduction in Rt shoulder pain with functional use    Baseline  --    Time  8    Period  Weeks    Status  New    Target Date  11/21/19      PT LONG TERM GOAL #4   Title  report postural corrections during the work day to improve postural strength and scapular alignment    Time  8    Period  Weeks    Status  New    Target Date  11/21/19      PT LONG TERM GOAL #5   Title  report sleeping all night due to reduce pain with pulling covers with the Rt UE    Time  8    Period  Weeks    Status  New    Target Date  11/21/19            Plan - 10/05/19 1122    Clinical Impression Statement  Pt with first time follow-up after evaluation.  Pt is independent in HEP for Rt shoulder flexibility and strength.  Pt has returned to some aerobics using weights and is limiting the use of weights overhead.  Pt with improved postural awareness and is making corrections during the day.  Pt required tactile cues for technique with shoulder theraband exercises.  Pt added to HEP for strength against gravity and pt will work on this at home.  Pt will continue to benefit from skilled PT to address Rt shoulder strength, flexibility and endurance to  improve functional use.    PT Frequency  1x / week    PT Duration  8 weeks    PT Treatment/Interventions  ADLs/Self Care Home Management;Cryotherapy;Electrical Stimulation;Moist Heat;Iontophoresis 4mg /ml Dexamethasone;Therapeutic exercise;Patient/family education;Manual techniques;Dry needling;Passive range of motion;Taping    PT Next Visit Plan  Postural strength to improve scapular alignment, rotator cuff strength, work overhead with light weights    PT Home Exercise Plan  Access Code: [email or web address]    Recommended Other Services  initial cert is signed    Consulted and Agree with  Plan of Care  Patient       Patient will benefit from skilled therapeutic intervention in order to improve the following deficits and impairments:  Decreased activity tolerance, Postural dysfunction, Improper body mechanics, Pain, Increased muscle spasms  Visit Diagnosis: Chronic right shoulder pain  Cramp and spasm     Problem List Patient Active Problem List   Diagnosis Date Noted  . Nontraumatic incomplete tear of right rotator cuff 09/15/2019  . Chronic right shoulder pain 09/04/2019  . Primary osteoarthritis involving multiple joints 08/02/2019  . Rotator cuff disorder, left 04/17/2019  . Irritable bowel syndrome with diarrhea 01/10/2019  . Lumbar radiculopathy, right 07/06/2017  . Eczema of face 05/26/2017  . Routine general medical examination at a health care facility 03/16/2016  . RLS (restless legs syndrome) 03/16/2016  . Hypertriglyceridemia, essential 04/04/2015  . Hyperlipidemia with target LDL less than 100 04/04/2015  . Severe obesity (BMI >= 40) ([place]) 09/20/2013  . Hyperglycemia 09/30/2011  . Hypothyroidism 07/09/2010  . Allergic rhinitis due to pollen 06/30/2010  . TOBACCO USE 11/28/2009  . Depression, major, in remission ([place]) 11/28/2009  . Essential hypertension 11/28/2009    Sigurd Sos, PT 10/05/19 11:39 AM  [facility] [address], [place], [place], [address] Phone: [phone]   Fax:  [phone]  Name: ZYION REMER MRN: [id number] Date of Birth: [birth year]

## 2019-10-05 NOTE — Patient Instructions (Signed)
Access Code: [email or web address] URL: [email or web address] Date: 09/26/2019 Prepared by: Claiborne Billings  Exercises  Seated Shoulder Abduction - Palms Down - 2 x daily - 7 x weekly - 10 reps - 2 sets Seated Shoulder Flexion - 2 x daily - 7 x weekly - 10 reps - 2 sets Sidelying Shoulder ER with Towel and Dumbbell - 1 x daily - 7 x weekly - 2 sets - 10 reps Wall Push Up - 2 x daily - 7 x weekly - 2 sets - 10 reps Sidelying Shoulder Abduction - 2 x daily - 7 x weekly - 2 sets - 10 reps

## 2019-10-09 ENCOUNTER — Other Ambulatory Visit: Payer: Self-pay | Admitting: Internal Medicine

## 2019-10-09 DIAGNOSIS — I10 Essential (primary) hypertension: Secondary | ICD-10-CM

## 2019-10-09 DIAGNOSIS — J301 Allergic rhinitis due to pollen: Secondary | ICD-10-CM

## 2019-10-18 ENCOUNTER — Ambulatory Visit: Payer: 59

## 2019-10-18 ENCOUNTER — Other Ambulatory Visit: Payer: Self-pay

## 2019-10-18 DIAGNOSIS — G8929 Other chronic pain: Secondary | ICD-10-CM

## 2019-10-18 DIAGNOSIS — R293 Abnormal posture: Secondary | ICD-10-CM

## 2019-10-18 DIAGNOSIS — M25511 Pain in right shoulder: Secondary | ICD-10-CM | POA: Diagnosis not present

## 2019-10-18 DIAGNOSIS — R252 Cramp and spasm: Secondary | ICD-10-CM

## 2019-10-18 NOTE — Therapy (Signed)
[facility] [address], [place], [place], [address] Phone: [phone]   Fax:  [phone]  Physical Therapy Treatment  Patient Details  Name: Holly Mcmillan MRN: [id number] Date of Birth: November 16, 1970 Referring Provider (PT): [name], MD   Encounter Date: 10/18/2019   PT End of Session - 10/18/19 1642    Visit Number 3    Date for PT Re-Evaluation 11/21/19    PT Start Time 2992    PT Stop Time 1642    PT Time Calculation (min) 29 min    Activity Tolerance Patient limited by fatigue;Patient tolerated treatment well    Behavior During Therapy [facility] for tasks assessed/performed           Past Medical History:  Diagnosis Date  . Depression   . Dysmenorrhea   . Gallbladder disease   . Headache(784.0)   . Hypertension   . Hypothyroidism   . Obesity   . Ovarian cyst     Past Surgical History:  Procedure Laterality Date  . ablation    . BREAST CYST EXCISION Right 06/24/2016   Procedure: EXCISION RIGHT BREAST CYST;  Surgeon: Coralie Keens, MD;  Location: [place];  Service: General;  Laterality: Right;  . CESAREAN SECTION  2002, 2009  . CHOLECYSTECTOMY  2005  . NASAL SINUS SURGERY    . THYROID SURGERY  2012    There were no vitals filed for this visit.   Subjective Assessment - 10/18/19 1614    Subjective I am having a constant dull ache in my Rt shoulder.  Pain increases with lifting and reaching out to the side.  I have been doing my exercises with weights overhead.    Patient Stated Goals improve use of Rt UE    Currently in Pain? Yes    Pain Score 3    up to 7/10 with use.   Pain Location Shoulder    Pain Orientation Right    Pain Descriptors / Indicators Aching;Dull;Constant    Pain Type Chronic pain    Pain Onset More than a month ago    Pain Frequency Intermittent    Aggravating Factors  lifting, use of Rt UE, reaching out to the side    Pain Relieving Factors not much,  when I stop the aggravating activity                             [facility] Adult PT Treatment/Exercise - 10/18/19 0001      Shoulder Exercises: Seated   Flexion Strengthening;Right;20 reps;Weights    Flexion Weight (lbs) 1    Diagonals Strengthening;Right;20 reps    Diagonals Weight (lbs) 1      Shoulder Exercises: Sidelying   External Rotation Strengthening;Right;20 reps    External Rotation Weight (lbs) 1    ABduction Strengthening;Right;20 reps    ABduction Weight (lbs) 1    Other Sidelying Exercises open book stretch x10 each      Shoulder Exercises: Standing   Shoulder Elevation Limitations cone stack 1# weight: 2x1 minute    Other Standing Exercises wall push ups 2x10    Other Standing Exercises finger ladder 1# 2x10      Shoulder Exercises: ROM/Strengthening   UBE (Upper Arm Bike) Level 1x 6 minutes (3/3)   PT present to discuss progress                   PT Short Term Goals -  09/26/19 1227      PT SHORT TERM GOAL #1   Title be independent in initial HEP    Time 4    Period Weeks    Status New    Target Date 10/24/19      PT SHORT TERM GOAL #2   Title report < or = to 4/10 Rt shoulder pain with use with ADLs and self-care    Baseline --    Time 4    Period Weeks    Status New    Target Date 10/24/19      PT SHORT TERM GOAL #3   Title ---      PT SHORT TERM GOAL #4   Title ----             PT Long Term Goals - 09/26/19 1227      PT LONG TERM GOAL #1   Title be independent in advanced HEP    Baseline --    Time 8    Period Weeks    Status New    Target Date 11/21/19      PT LONG TERM GOAL #2   Title reduce FOTO to < or = to 30% limitation    Baseline --    Time 8    Period Weeks    Status New    Target Date 11/21/19      PT LONG TERM GOAL #3   Title report a 75% reduction in Rt shoulder pain with functional use    Baseline --    Time 8    Period Weeks    Status New    Target Date 11/21/19      PT LONG TERM  GOAL #4   Title report postural corrections during the work day to improve postural strength and scapular alignment    Time 8    Period Weeks    Status New    Target Date 11/21/19      PT LONG TERM GOAL #5   Title report sleeping all night due to reduce pain with pulling covers with the Rt UE    Time 8    Period Weeks    Status New    Target Date 11/21/19                 Plan - 10/18/19 1628    Clinical Impression Statement Pt is independent in HEP for Rt shoulder flexibility and strength. Pt denies any significant change in pain in the Rt shoulder with use.  Pt has returned to some aerobics using weights and is now able to lift overhead.  Pt with improved postural awareness and is making corrections during the day.  Pt demonstrates improved posture with exercises in the clinic today.  Pt required intermittent verbal and demo cues for technique.  Pt will continue to benefit from skilled PT to address Rt shoulder strength, flexibility and endurance to improve functional use.    Rehab Potential Excellent    PT Frequency 1x / week    PT Duration 8 weeks    PT Treatment/Interventions ADLs/Self Care Home Management;Cryotherapy;Electrical Stimulation;Moist Heat;Iontophoresis 4mg /ml Dexamethasone;Therapeutic exercise;Patient/family education;Manual techniques;Dry needling;Passive range of motion;Taping    PT Next Visit Plan Postural strength to improve scapular alignment, rotator cuff strength, work overhead with light weights    PT Home Exercise Plan Access Code: [email or web address]    Consulted and Agree with Plan of Care Patient           Patient will benefit from  skilled therapeutic intervention in order to improve the following deficits and impairments:  Decreased activity tolerance, Postural dysfunction, Improper body mechanics, Pain, Increased muscle spasms  Visit Diagnosis: Chronic right shoulder pain  Cramp and spasm  Abnormal posture     Problem List Patient Active Problem  List   Diagnosis Date Noted  . Nontraumatic incomplete tear of right rotator cuff 09/15/2019  . Chronic right shoulder pain 09/04/2019  . Primary osteoarthritis involving multiple joints 08/02/2019  . Rotator cuff disorder, left 04/17/2019  . Irritable bowel syndrome with diarrhea 01/10/2019  . Lumbar radiculopathy, right 07/06/2017  . Eczema of face 05/26/2017  . Routine general medical examination at a health care facility 03/16/2016  . RLS (restless legs syndrome) 03/16/2016  . Hypertriglyceridemia, essential 04/04/2015  . Hyperlipidemia with target LDL less than 100 04/04/2015  . Severe obesity (BMI >= 40) ([place]) 09/20/2013  . Hyperglycemia 09/30/2011  . Hypothyroidism 07/09/2010  . Allergic rhinitis due to pollen 06/30/2010  . TOBACCO USE 11/28/2009  . Depression, major, in remission ([place]) 11/28/2009  . Essential hypertension 11/28/2009    Sigurd Sos, PT 10/18/19 4:47 PM  [facility] [address] [place], [place], [address] Phone: [phone]   Fax:  [phone]  Name: Holly Mcmillan MRN: [id number] Date of Birth: October 07, 1970

## 2019-10-20 ENCOUNTER — Ambulatory Visit: Payer: 59 | Admitting: Family Medicine

## 2019-10-21 ENCOUNTER — Other Ambulatory Visit: Payer: Self-pay | Admitting: Internal Medicine

## 2019-10-21 DIAGNOSIS — E039 Hypothyroidism, unspecified: Secondary | ICD-10-CM

## 2019-10-23 ENCOUNTER — Other Ambulatory Visit: Payer: Self-pay

## 2019-10-23 ENCOUNTER — Ambulatory Visit (INDEPENDENT_AMBULATORY_CARE_PROVIDER_SITE_OTHER): Payer: 59 | Admitting: Family Medicine

## 2019-10-23 VITALS — BP 106/78 | Ht 62.0 in | Wt 208.0 lb

## 2019-10-23 DIAGNOSIS — M25511 Pain in right shoulder: Secondary | ICD-10-CM | POA: Diagnosis not present

## 2019-10-23 NOTE — Progress Notes (Signed)
° °  I, Wendy Poet, LAT, ATC, am serving as scribe for Dr. [name].  ITATI BROCKSMITH is a 49 y.o. female who presents to [place] [facility] today for R shoulder pain.  She was last seen by Dr. [name] on 09/15/19 and was referred to outpatient PT of which she completed 3 visits. States that PT is helping. She was prescribed nitroglycerin patches. Tried patches but patient developed headaches. Since her last visit, pt reports that her pain is no longer sharp but she always has an achy sensation with lifting.   Diagnostic imaging: R shoulder XR- 09/04/19  Pertinent review of systems: No fevers or chills  Relevant historical information: Hypertension   Exam:  BP 106/78    Ht 5\' 2"  (1.575 m)    Wt 208 lb (94.3 kg)    BMI 38.04 kg/m  General: Well Developed, well nourished, and in no acute distress.   MSK:  Right shoulder: Normal-appearing Normal motion. Intact strength abduction external and internal rotation. Negative Hawkins and Neer's test.  Negative empty can test. Negative Yergason's and speeds test.     Assessment and Plan: 49 y.o. female with right shoulder pain.  Significant improvement in strength and pain with physical therapy.  Plan for continued therapy and home exercise program. If not sufficiently improved will proceed with either injection or MRI.  Patient will notify me and recheck back as needed.    Discussed warning signs or symptoms. Please see discharge instructions. Patient expresses understanding.   The above documentation has been reviewed and is accurate and complete [name], M.D.  Total encounter time 20 minutes including charting time date of service.

## 2019-10-23 NOTE — Patient Instructions (Addendum)
Thank you for coming in today. Plan for continue PT and home exercise.  Let me know I need to modify or extend the PT.  If not good enough next steps are either injection or MRI.  Let me know.

## 2019-10-25 ENCOUNTER — Encounter: Payer: Self-pay | Admitting: Physical Therapy

## 2019-10-25 ENCOUNTER — Other Ambulatory Visit: Payer: Self-pay

## 2019-10-25 ENCOUNTER — Ambulatory Visit: Payer: 59 | Admitting: Physical Therapy

## 2019-10-25 DIAGNOSIS — M25511 Pain in right shoulder: Secondary | ICD-10-CM | POA: Diagnosis not present

## 2019-10-25 DIAGNOSIS — R293 Abnormal posture: Secondary | ICD-10-CM

## 2019-10-25 DIAGNOSIS — G8929 Other chronic pain: Secondary | ICD-10-CM

## 2019-10-25 DIAGNOSIS — R252 Cramp and spasm: Secondary | ICD-10-CM

## 2019-10-25 NOTE — Therapy (Signed)
[facility] [address], [place], [place], [address] Phone: [phone]   Fax:  [phone]  Physical Therapy Treatment  Patient Details  Name: Holly Mcmillan MRN: [id number] Date of Birth: [birth year] Referring Provider (PT): [name], MD   Encounter Date: 10/25/2019   PT End of Session - 10/25/19 1229    Visit Number 4    Date for PT Re-Evaluation 11/21/19    PT Start Time 1229    PT Stop Time 1311    PT Time Calculation (min) 42 min    Activity Tolerance Patient limited by fatigue;Patient tolerated treatment well    Behavior During Therapy [facility] for tasks assessed/performed           Past Medical History:  Diagnosis Date  . Depression   . Dysmenorrhea   . Gallbladder disease   . Headache(784.0)   . Hypertension   . Hypothyroidism   . Obesity   . Ovarian cyst     Past Surgical History:  Procedure Laterality Date  . ablation    . BREAST CYST EXCISION Right 06/24/2016   Procedure: EXCISION RIGHT BREAST CYST;  Surgeon: Coralie Keens, MD;  Location: [place];  Service: General;  Laterality: Right;  . CESAREAN SECTION  2002, 2009  . CHOLECYSTECTOMY  2005  . NASAL SINUS SURGERY    . THYROID SURGERY  2012    There were no vitals filed for this visit.   Subjective Assessment - 10/25/19 1229    Subjective I can tell it's improving. I can lift a little better. I saw MD yesterday and my strength was good. I'm able to drive better without restriction or pain. Able to lift heavier pans, but I'm still careful.    Currently in Pain? Yes    Pain Score 2     Pain Location Shoulder    Pain Orientation Right    Pain Descriptors / Indicators Aching                             [facility] Adult PT Treatment/Exercise - 10/25/19 0001      Shoulder Exercises: Supine   Horizontal ABduction Both;10 reps    Theraband Level (Shoulder Horizontal ABduction) Level 2 (Red)    Diagonals  Right;10 reps;Theraband    Theraband Level (Shoulder Diagonals) Level 2 (Red)      Shoulder Exercises: Sidelying   ABduction Strengthening;Right;20 reps    ABduction Weight (lbs) 2    ABduction Limitations 10 reps with thumb up    Other Sidelying Exercises open book stretch x5 each      Shoulder Exercises: Standing   External Rotation Strengthening;Right;20 reps;Theraband    Theraband Level (Shoulder External Rotation) Level 2 (Red)    Internal Rotation Strengthening;Right;20 reps;Theraband    Theraband Level (Shoulder Internal Rotation) Level 2 (Red)    Flexion Strengthening;Right;20 reps;Theraband    Theraband Level (Shoulder Flexion) Level 2 (Red)    Extension Right;Strengthening;20 reps;Theraband    Theraband Level (Shoulder Extension) Level 2 (Red)    Shoulder Elevation Limitations cone stack 1# weight: 2x1 minute    Other Standing Exercises Wall push ups military x 10; right arm shrug with arm at 30 deg ABD; shoulder ER; red band x 10    Other Standing Exercises finger ladder 1# 2x10      Shoulder Exercises: ROM/Strengthening   UBE (Upper Arm Bike) Level 1x 6 minutes (3/3)   PT present to  discuss progress     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;30 seconds    Corner Stretch Limitations mid and low arms    Other Shoulder Stretches UT and levator stretch right x 30 sec                    PT Short Term Goals - 10/25/19 1233      PT SHORT TERM GOAL #1   Title be independent in initial HEP    Status Achieved      PT SHORT TERM GOAL #2   Title report < or = to 4/10 Rt shoulder pain with use with ADLs and self-care    Status Achieved             PT Long Term Goals - 09/26/19 1227      PT LONG TERM GOAL #1   Title be independent in advanced HEP    Baseline --    Time 8    Period Weeks    Status New    Target Date 11/21/19      PT LONG TERM GOAL #2   Title reduce FOTO to < or = to 30% limitation    Baseline --    Time 8    Period  Weeks    Status New    Target Date 11/21/19      PT LONG TERM GOAL #3   Title report a 75% reduction in Rt shoulder pain with functional use    Baseline --    Time 8    Period Weeks    Status New    Target Date 11/21/19      PT LONG TERM GOAL #4   Title report postural corrections during the work day to improve postural strength and scapular alignment    Time 8    Period Weeks    Status New    Target Date 11/21/19      PT LONG TERM GOAL #5   Title report sleeping all night due to reduce pain with pulling covers with the Rt UE    Time 8    Period Weeks    Status New    Target Date 11/21/19                 Plan - 10/25/19 1315    Clinical Impression Statement Patient is progressing with goals. She is able to lift better and can drive without pain. She c/o tightness in her right UT today and felt relief with single arm 30 deg shrugs. She requires VCs to keep head in neutral with many exercises and to engage scapular muscles.    PT Frequency 1x / week    PT Duration 8 weeks    PT Treatment/Interventions ADLs/Self Care Home Management;Cryotherapy;Electrical Stimulation;Moist Heat;Iontophoresis 4mg /ml Dexamethasone;Therapeutic exercise;Patient/family education;Manual techniques;Dry needling;Passive range of motion;Taping    PT Next Visit Plan Postural strength to improve scapular alignment, rotator cuff strength, work overhead with light weights    PT Home Exercise Plan Access Code: [email or web address]    Consulted and Agree with Plan of Care Patient           Patient will benefit from skilled therapeutic intervention in order to improve the following deficits and impairments:  Decreased activity tolerance, Postural dysfunction, Improper body mechanics, Pain, Increased muscle spasms  Visit Diagnosis: Chronic right shoulder pain  Cramp and spasm  Abnormal posture     Problem List Patient Active Problem List   Diagnosis Date  Noted  . Chronic right shoulder pain 09/04/2019    . Primary osteoarthritis involving multiple joints 08/02/2019  . Rotator cuff disorder, left 04/17/2019  . Irritable bowel syndrome with diarrhea 01/10/2019  . Lumbar radiculopathy, right 07/06/2017  . Eczema of face 05/26/2017  . Routine general medical examination at a health care facility 03/16/2016  . RLS (restless legs syndrome) 03/16/2016  . Hypertriglyceridemia, essential 04/04/2015  . Hyperlipidemia with target LDL less than 100 04/04/2015  . Severe obesity (BMI >= 40) ([place]) 09/20/2013  . Hyperglycemia 09/30/2011  . Hypothyroidism 07/09/2010  . Allergic rhinitis due to pollen 06/30/2010  . TOBACCO USE 11/28/2009  . Depression, major, in remission ([place]) 11/28/2009  . Essential hypertension 11/28/2009    Madelyn Flavors PT 10/25/2019, 1:20 PM  [facility] [address] [place], [place], [address] Phone: [phone]   Fax:  [phone]  Name: Holly Mcmillan MRN: [id number] Date of Birth: 12-22-1970

## 2019-11-01 ENCOUNTER — Encounter: Payer: Managed Care, Other (non HMO) | Admitting: Physical Therapy

## 2019-11-07 ENCOUNTER — Other Ambulatory Visit: Payer: Self-pay | Admitting: Family Medicine

## 2019-11-07 ENCOUNTER — Other Ambulatory Visit: Payer: Self-pay | Admitting: Internal Medicine

## 2019-11-07 DIAGNOSIS — F325 Major depressive disorder, single episode, in full remission: Secondary | ICD-10-CM

## 2019-11-08 ENCOUNTER — Other Ambulatory Visit: Payer: Self-pay

## 2019-11-08 ENCOUNTER — Telehealth: Payer: Self-pay

## 2019-11-08 ENCOUNTER — Ambulatory Visit: Payer: 59 | Attending: Family Medicine

## 2019-11-08 DIAGNOSIS — M25511 Pain in right shoulder: Secondary | ICD-10-CM | POA: Diagnosis not present

## 2019-11-08 DIAGNOSIS — R252 Cramp and spasm: Secondary | ICD-10-CM | POA: Diagnosis present

## 2019-11-08 DIAGNOSIS — G8929 Other chronic pain: Secondary | ICD-10-CM | POA: Diagnosis present

## 2019-11-08 DIAGNOSIS — R293 Abnormal posture: Secondary | ICD-10-CM | POA: Insufficient documentation

## 2019-11-08 NOTE — Therapy (Signed)
[facility] [address] [place], [place], [address] Phone: [phone]   Fax:  [phone]  Physical Therapy Treatment  Patient Details  Name: Holly Mcmillan MRN: [id number] Date of Birth: 26-Dec-1970 Referring Provider (PT): [name], MD   Encounter Date: 11/08/2019   PT End of Session - 11/08/19 1221    Visit Number 5    Authorization Type [employer]    PT Start Time 5397    PT Stop Time 1219    PT Time Calculation (min) 30 min    Activity Tolerance Patient tolerated treatment well    Behavior During Therapy [facility] for tasks assessed/performed           Past Medical History:  Diagnosis Date  . Depression   . Dysmenorrhea   . Gallbladder disease   . Headache(784.0)   . Hypertension   . Hypothyroidism   . Obesity   . Ovarian cyst     Past Surgical History:  Procedure Laterality Date  . ablation    . BREAST CYST EXCISION Right 06/24/2016   Procedure: EXCISION RIGHT BREAST CYST;  Surgeon: Coralie Keens, MD;  Location: [place];  Service: General;  Laterality: Right;  . CESAREAN SECTION  2002, 2009  . CHOLECYSTECTOMY  2005  . NASAL SINUS SURGERY    . THYROID SURGERY  2012    There were no vitals filed for this visit.   Subjective Assessment - 11/08/19 1152    Subjective 75% overall improvement in Rt shoulder.    Currently in Pain? No/denies              [facility] PT Assessment - 11/08/19 0001      Assessment   Medical Diagnosis acute pain of Rt shoulder     Referring Provider (PT) [name], MD      Prior Function   Level of Independence Independent      Cognition   Overall Cognitive Status Within Functional Limits for tasks assessed      Observation/Other Assessments   Focus on Therapeutic Outcomes (FOTO)  27% limitation                         [facility] Adult PT Treatment/Exercise - 11/08/19 0001      Shoulder Exercises: Supine   Horizontal ABduction  Both;10 reps    Theraband Level (Shoulder Horizontal ABduction) Level 3 (Green)    Diagonals Right;10 reps;Theraband    Theraband Level (Shoulder Diagonals) Level 3 (Green)      Shoulder Exercises: Sidelying   ABduction Strengthening;Right;20 reps    ABduction Weight (lbs) 2      Shoulder Exercises: Standing   External Rotation Strengthening;Right;20 reps;Theraband    Theraband Level (Shoulder External Rotation) Level 3 (Green)    Internal Rotation Strengthening;Right;20 reps;Theraband    Theraband Level (Shoulder Internal Rotation) Level 3 (Green)    Flexion Strengthening;Right;20 reps;Theraband    Theraband Level (Shoulder Flexion) Level 3 (Green)    Extension Right;Strengthening;20 reps;Theraband    Theraband Level (Shoulder Extension) Level 3 (Green)      Shoulder Exercises: ROM/Strengthening   UBE (Upper Arm Bike) Level 2 x 6 minutes (3/3)   PT present to discuss progress                   PT Short Term Goals - 10/25/19 1233      PT SHORT TERM GOAL #1   Title be independent in initial HEP  Status Achieved      PT SHORT TERM GOAL #2   Title report < or = to 4/10 Rt shoulder pain with use with ADLs and self-care    Status Achieved             PT Long Term Goals - 11/08/19 1153      PT LONG TERM GOAL #1   Title be independent in advanced HEP    Status Achieved      PT LONG TERM GOAL #2   Title reduce FOTO to < or = to 30% limitation    Baseline 27%    Status Achieved      PT LONG TERM GOAL #3   Title report a 75% reduction in Rt shoulder pain with functional use    Baseline 75% reduction    Status Achieved      PT LONG TERM GOAL #4   Title report postural corrections during the work day to improve postural strength and scapular alignment    Status Achieved      PT LONG TERM GOAL #5   Title report sleeping all night due to reduce pain with pulling covers with the Rt UE    Status Achieved                 Plan - 11/08/19 1207     Clinical Impression Statement Pt reports 75% overall improvement in Rt shoulder pain and use.  Pt reports limitation in overhead lifting with heavy weight and endurance tasks.  Pt has met all goals and will be discharged to HEP for comprehensive strength and endurance.  PT issued green theraband for HEP today.    PT Next Visit Plan D/C PT to HEP    PT Home Exercise Plan Access Code: [email or web address]    Consulted and Agree with Plan of Care Patient           Patient will benefit from skilled therapeutic intervention in order to improve the following deficits and impairments:     Visit Diagnosis: Chronic right shoulder pain  Cramp and spasm  Abnormal posture     Problem List Patient Active Problem List   Diagnosis Date Noted  . Chronic right shoulder pain 09/04/2019  . Primary osteoarthritis involving multiple joints 08/02/2019  . Rotator cuff disorder, left 04/17/2019  . Irritable bowel syndrome with diarrhea 01/10/2019  . Lumbar radiculopathy, right 07/06/2017  . Eczema of face 05/26/2017  . Routine general medical examination at a health care facility 03/16/2016  . RLS (restless legs syndrome) 03/16/2016  . Hypertriglyceridemia, essential 04/04/2015  . Hyperlipidemia with target LDL less than 100 04/04/2015  . Severe obesity (BMI >= 40) ([name]) 09/20/2013  . Hyperglycemia 09/30/2011  . Hypothyroidism 07/09/2010  . Allergic rhinitis due to pollen 06/30/2010  . TOBACCO USE 11/28/2009  . Depression, major, in remission ([place]) 11/28/2009  . Essential hypertension 11/28/2009   PHYSICAL THERAPY DISCHARGE SUMMARY  Visits from Start of Care:5   Current functional level related to goals / functional outcomes: See above for current status.     Remaining deficits: Functional Rt UE weakness associated with RTC tear.  Pt has comprehensive HEP in place.     Education / Equipment: HEP, posture Plan: Patient agrees to discharge.  Patient goals were met. Patient is being discharged due to  meeting the stated rehab goals.  ?????         Holly Mcmillan, PT 11/08/19 12:24 PM  [facility] [address]  [place], [place], [place], [address] Phone: [phone]   Fax:  [phone]  Name: Holly Mcmillan MRN: [id number] Date of Birth: 1971/02/10

## 2019-11-08 NOTE — Telephone Encounter (Signed)
Key: [id number])

## 2019-11-15 ENCOUNTER — Encounter: Payer: Self-pay | Admitting: Internal Medicine

## 2019-11-16 ENCOUNTER — Telehealth: Payer: Self-pay

## 2019-11-16 DIAGNOSIS — E039 Hypothyroidism, unspecified: Secondary | ICD-10-CM

## 2019-11-16 NOTE — Telephone Encounter (Signed)
Key: [id number]

## 2019-11-16 NOTE — Telephone Encounter (Signed)
PA determination came back "Drug is covered by current benefit plan. No further PA activity needed"

## 2019-11-21 NOTE — Telephone Encounter (Signed)
Spoke to pharmacy and they stated that it is "not on formulary"   Patient is suppose to be on 300 mcg daily of the Synthroid. I have verified that with mychart messages from PCP to patient (08/02/2019).

## 2019-11-22 MED ORDER — SYNTHROID 200 MCG PO TABS: 200.0000 ug | ORAL_TABLET | Freq: Every day | ORAL | 1 refills | Status: DC

## 2019-11-22 MED ORDER — SYNTHROID 100 MCG PO TABS: 100.0000 ug | ORAL_TABLET | Freq: Every day | ORAL | 1 refills | Status: DC

## 2019-12-08 ENCOUNTER — Other Ambulatory Visit: Payer: Self-pay | Admitting: Internal Medicine

## 2019-12-08 DIAGNOSIS — E781 Pure hyperglyceridemia: Secondary | ICD-10-CM

## 2020-01-29 ENCOUNTER — Other Ambulatory Visit: Payer: Self-pay | Admitting: Internal Medicine

## 2020-01-29 ENCOUNTER — Telehealth: Payer: Self-pay | Admitting: Internal Medicine

## 2020-01-29 DIAGNOSIS — E785 Hyperlipidemia, unspecified: Secondary | ICD-10-CM

## 2020-01-29 DIAGNOSIS — Z1159 Encounter for screening for other viral diseases: Secondary | ICD-10-CM

## 2020-01-29 DIAGNOSIS — E781 Pure hyperglyceridemia: Secondary | ICD-10-CM

## 2020-01-29 DIAGNOSIS — E039 Hypothyroidism, unspecified: Secondary | ICD-10-CM

## 2020-01-29 NOTE — Telephone Encounter (Signed)
Patient would like labs entered for her thyroid and cholesterol levels  Please let patient know when labs have been entered so that she can go to [place] to have labs drawn

## 2020-01-29 NOTE — Telephone Encounter (Signed)
Called pt, LVM stating labs have been drawn.

## 2020-02-01 ENCOUNTER — Ambulatory Visit (INDEPENDENT_AMBULATORY_CARE_PROVIDER_SITE_OTHER): Payer: 59 | Admitting: Family Medicine

## 2020-02-01 ENCOUNTER — Ambulatory Visit (INDEPENDENT_AMBULATORY_CARE_PROVIDER_SITE_OTHER): Payer: 59

## 2020-02-01 ENCOUNTER — Other Ambulatory Visit: Payer: Self-pay

## 2020-02-01 ENCOUNTER — Encounter: Payer: Self-pay | Admitting: Family Medicine

## 2020-02-01 ENCOUNTER — Other Ambulatory Visit (INDEPENDENT_AMBULATORY_CARE_PROVIDER_SITE_OTHER): Payer: 59

## 2020-02-01 VITALS — BP 118/88 | HR 79 | Ht 62.0 in | Wt 190.0 lb

## 2020-02-01 DIAGNOSIS — E785 Hyperlipidemia, unspecified: Secondary | ICD-10-CM | POA: Diagnosis not present

## 2020-02-01 DIAGNOSIS — Z96649 Presence of unspecified artificial hip joint: Secondary | ICD-10-CM | POA: Insufficient documentation

## 2020-02-01 DIAGNOSIS — M25551 Pain in right hip: Secondary | ICD-10-CM

## 2020-02-01 DIAGNOSIS — E781 Pure hyperglyceridemia: Secondary | ICD-10-CM | POA: Diagnosis not present

## 2020-02-01 DIAGNOSIS — Z9889 Other specified postprocedural states: Secondary | ICD-10-CM | POA: Insufficient documentation

## 2020-02-01 DIAGNOSIS — Z1159 Encounter for screening for other viral diseases: Secondary | ICD-10-CM

## 2020-02-01 DIAGNOSIS — E039 Hypothyroidism, unspecified: Secondary | ICD-10-CM

## 2020-02-01 DIAGNOSIS — Z96641 Presence of right artificial hip joint: Secondary | ICD-10-CM

## 2020-02-01 LAB — LIPID PANEL
Cholesterol: 241 mg/dL — ABNORMAL HIGH (ref 0–200)
HDL: 52.8 mg/dL (ref >39.00)
LDL Cholesterol: 161 mg/dL — ABNORMAL HIGH (ref 0–99)
NonHDL: 187.96
Total CHOL/HDL Ratio: 5
Triglycerides: 134 mg/dL (ref 0.0–149.0)
VLDL: 26.8 mg/dL (ref 0.0–40.0)

## 2020-02-01 LAB — TSH: TSH: 2.44 u[IU]/mL (ref 0.35–4.50)

## 2020-02-01 NOTE — Patient Instructions (Signed)
Thank you for coming in today.  Please get an Xray today before you leave  I've referred you to Physical Therapy.  Let us know if you don't hear from them in one week.  Recheck in about 6 weeks.  Keep me updated. Return or contact me sooner if needed.

## 2020-02-01 NOTE — Progress Notes (Signed)
Rito Ehrlich, am serving as a Education administrator for Dr. [name].  Holly Mcmillan is a 49 y.o. female who presents to [place] at [facility] today for hip pain.  She was last seen by Dr. [name] on 10/23/19 for her R shoulder and was advised to con't PT and her HEP.  Since her last visit with Dr. [name], pt reports having Right hip pain where she cannot even put weight on it. States that when she does put her weight on the right side she feels "wobbly" also cannot lift her leg up without having pain.   Hip mechanical symptoms: no  Aggravating factors: any weight/pressure Treatments tried: resting; ice;   Pertinent review of systems: No fevers or chills  Relevant historical information: Right total hip replacement in 2019.  Dr. Latanya Maudlin   Exam:  BP 118/88 (BP Location: Left Arm, Patient Position: Sitting, Cuff Size: Normal)   Pulse 79   Ht 5\' 2"  (1.575 m)   Wt 190 lb (86.2 kg)   SpO2 99%   BMI 34.75 kg/m  General: Well Developed, well nourished, and in no acute distress.   MSK: Right hip normal-appearing normal motion.  Tender palpation greater trochanter region and hip abductors. Hip abduction strength diminished 4/5.  External rotation strength diminished 4/5. Negative Faber test. Negative FADIR test.     Lab and Radiology Results X-ray images right hip obtained today personally and independently interpreted Intact surgical hardware.  Abnormal appearance of femoral stem near the lesser trochanter.  No clear fracture per my interpretation Await formal radiology review     Assessment and Plan: 50 y.o. female with hip pain ongoing for about 2 months in the setting of total hip replacement 2 years ago.  She has weakness to hip abduction and external rotation.  X-ray has some of the abnormal appearance of the femoral stem however radiology overread is still pending.  We will proceed with three-phase bone scan and referral to physical therapy.  If radiology is  concerned about the appearance will get bone scan earlier otherwise proceed with bone scan in a month if PT is not helpful.   PDMP not reviewed this encounter. Orders Placed This Encounter  Procedures  . DG HIP UNILAT WITH PELVIS 2-3 VIEWS RIGHT    Standing Status:   Future    Number of Occurrences:   1    Standing Expiration Date:   01/31/2021    Order Specific Question:   Reason for Exam (SYMPTOM  OR DIAGNOSIS REQUIRED)    Answer:   eval hip pain    Order Specific Question:   Is patient pregnant?    Answer:   No    Order Specific Question:   Preferred imaging location?    Answer:   [facility]  . NM Bone Scan 3 Phase Lower Extremity    Standing Status:   Future    Standing Expiration Date:   01/31/2021    Order Specific Question:   If indicated for the ordered procedure, I authorize the administration of a radiopharmaceutical per Radiology protocol    Answer:   Yes    Order Specific Question:   Is the patient pregnant?    Answer:   Yes    Order Specific Question:   Preferred imaging location?    Answer:   [facility]  . Ambulatory referral to Physical Therapy    Referral Priority:   Routine    Referral Type:   Physical Medicine  Referral Reason:   Specialty Services Required    Requested Specialty:   Physical Therapy   No orders of the defined types were placed in this encounter.    Discussed warning signs or symptoms. Please see discharge instructions. Patient expresses understanding.   The above documentation has been reviewed and is accurate and complete [name], M.D.

## 2020-02-02 LAB — HEPATITIS C ANTIBODY
Hepatitis C Ab: NONREACTIVE
SIGNAL TO CUT-OFF: 0.01 (ref <1.00)

## 2020-02-05 NOTE — Progress Notes (Signed)
X-ray right hip looks normal post hip replacement.  No fracture or no acute hardware complication seen.  Bone scan should help.

## 2020-02-16 ENCOUNTER — Other Ambulatory Visit: Payer: Self-pay | Admitting: Internal Medicine

## 2020-02-16 DIAGNOSIS — I10 Essential (primary) hypertension: Secondary | ICD-10-CM

## 2020-02-19 ENCOUNTER — Encounter: Payer: Self-pay | Admitting: Rehabilitative and Restorative Service Providers"

## 2020-02-19 ENCOUNTER — Other Ambulatory Visit: Payer: Self-pay

## 2020-02-19 ENCOUNTER — Ambulatory Visit (INDEPENDENT_AMBULATORY_CARE_PROVIDER_SITE_OTHER): Payer: 59 | Admitting: Rehabilitative and Restorative Service Providers"

## 2020-02-19 DIAGNOSIS — R262 Difficulty in walking, not elsewhere classified: Secondary | ICD-10-CM

## 2020-02-19 DIAGNOSIS — M25651 Stiffness of right hip, not elsewhere classified: Secondary | ICD-10-CM | POA: Diagnosis not present

## 2020-02-19 DIAGNOSIS — M25551 Pain in right hip: Secondary | ICD-10-CM | POA: Diagnosis not present

## 2020-02-19 NOTE — Therapy (Signed)
[facility] Physical Therapy [address] [place], [place], [address] Phone: [phone]   Fax:  [phone]  Physical Therapy Evaluation  Patient Details  Name: Holly Mcmillan MRN: [id number] Date of Birth: 08/07/1970 Referring Provider (PT): Gregor Hams MD   Encounter Date: 02/19/2020   PT End of Session - 02/19/20 1113    Visit Number 1    Number of Visits 16    PT Start Time 2111    PT Stop Time 5520    PT Time Calculation (min) 40 min    Activity Tolerance Patient tolerated treatment well    Behavior During Therapy [facility] for tasks assessed/performed           Past Medical History:  Diagnosis Date   Depression    Dysmenorrhea    Gallbladder disease    Headache(784.0)    Hypertension    Hypothyroidism    Obesity    Ovarian cyst     Past Surgical History:  Procedure Laterality Date   ablation     BREAST CYST EXCISION Right 06/24/2016   Procedure: EXCISION RIGHT BREAST CYST;  Surgeon: Coralie Keens, MD;  Location: [facility];  Service: General;  Laterality: Right;   CESAREAN SECTION  2002, 2009   CHOLECYSTECTOMY  2005   NASAL SINUS SURGERY     THYROID SURGERY  2012    There were no vitals filed for this visit.    Subjective Assessment - 02/19/20 1108    Subjective Holly Mcmillan has had increasing R hip pain since August of this year.  Standing and walking are worse than sitting.  She has a previous THA on the same (R) side (2018) and has had previous R sciatica.    Pertinent History HTN, obesity, L RTC, R lumbar radiculopathy, previous R THA    Limitations Standing;Walking    How long can you sit comfortably? Not a problem    How long can you stand comfortably? Not long    How long can you walk comfortably? Short distances within the house    Diagnostic tests Bone scan tomorrow.  X-ray normal for post-THA.    Patient Stated Goals Be able to stand and walk without pain.    Currently in Pain? Yes    Pain Score 7       Pain Location Hip    Pain Orientation Right    Pain Descriptors / Indicators Aching;Spasm;Sore;Tightness    Pain Type Acute pain    Pain Radiating Towards R anterior, lateral and posterior mid-thigh    Pain Onset More than a month ago    Pain Frequency Intermittent    Aggravating Factors  Stand and walk    Pain Relieving Factors Sit    Effect of Pain on Daily Activities Limits WB function    Multiple Pain Sites No              [facility] PT Assessment - 02/19/20 0001      Assessment   Medical Diagnosis R hip pain    Referring Provider (PT) Gregor Hams MD    Onset Date/Surgical Date --   August 2021 onset     Balance Screen   Has the patient fallen in the past 6 months No    Has the patient had a decrease in activity level because of a fear of falling?  No    Is the patient reluctant to leave their home because of a fear of falling?  No  Prior Function   Level of Independence Independent    Vocation Full time employment    Vocation Requirements Sitting and walking      Cognition   Overall Cognitive Status Within Functional Limits for tasks assessed      Observation/Other Assessments   Focus on Therapeutic Outcomes (FOTO)  44 (70 Goal)      ROM / Strength   AROM / PROM / Strength AROM;Strength      AROM   Overall AROM  Deficits    AROM Assessment Site Hip    Right/Left Hip Left;Right    Right Hip Flexion 80    Right Hip External Rotation  20    Right Hip Internal Rotation  2    Left Hip Flexion 90    Left Hip External Rotation  30    Left Hip Internal Rotation  10      Strength   Overall Strength Deficits    Strength Assessment Site Hip    Right/Left Hip Left;Right    Right Hip Flexion 3-/5    Right Hip ABduction 2+/5    Left Hip Flexion 4+/5    Left Hip ABduction 4+/5      Flexibility   Soft Tissue Assessment /Muscle Length yes    Hamstrings 40 degrees L/30 degrees R      Ambulation/Gait   Gait Comments Significant for R lateral lean and decreased  stride length                      Objective measurements completed on examination: See above findings.       [facility] End[facility] Adult PT Treatment/Exercise - 02/19/20 0001      Therapeutic Activites    Therapeutic Activities Other Therapeutic Activities   Reviewed exam findings and spine/hip anatomy     Exercises   Exercises Knee/Hip      Knee/Hip Exercises: Stretches   Hip Flexor Stretch Both;5 reps;20 seconds   Single knee to chest with other leg straight   Piriformis Stretch Both;5 reps;20 seconds   1 knee bent with foot on mat, cross ankle over bent knee     Knee/Hip Exercises: Sidelying   Hip ABduction Strengthening;Right;2 sets;10 reps   3 seconds; 1/4 turn to stomach                 PT Education - 02/19/20 1112    Education Details Reviewed exam findings, reviewed and gave feedback on starter HEP.    Person(s) Educated Patient    Methods Explanation;Demonstration;Tactile cues;Verbal cues;Handout    Comprehension Verbal cues required;Need further instruction;Returned demonstration;Verbalized understanding;Tactile cues required               PT Long Term Goals - 02/19/20 1118      PT LONG TERM GOAL #1   Title Improve FOTO score to 70.    Baseline 42    Time 8    Period Weeks    Status New    Target Date 04/19/20      PT LONG TERM GOAL #2   Title Improve hip AROM for flexion to 100; IR to 5; ER to 30 and hamstrings to 40 degrees.    Baseline 80; 2; 20 and 30 degrees, respectively    Time 8    Period Weeks    Status New    Target Date 04/19/20      PT LONG TERM GOAL #3   Title Improve R hip strength to 4+/5 MMT or better.  Baseline 2+/3- out of 5 MMT    Time 8    Period Weeks    Status New    Target Date 04/19/20      PT LONG TERM GOAL #4   Title Holly Mcmillan will be able to walk distances of a mile or more without being limited by R hip pain.    Baseline Very painful with walking around farmers' market or grocery store.    Time 8    Period  Weeks    Status New    Target Date 04/19/20      PT LONG TERM GOAL #5   Title Holly Mcmillan will be independent and compliant with her HEP at DC.    Time 8    Period Weeks    Status New    Target Date 04/19/20                  Plan - 02/19/20 1114    Clinical Impression Statement Holly Mcmillan has had increasing R hip, thigh and gluteal pain since August of this year.  This same hip was replaced in 2018 and X-rays show no abnormalities.  She has had previous R sided sciatica.  She is stiff, guarded and weak on the R side and a HEP was prescribed today to address this.  With improved AROM, flexibility and strength, Jenaya should be able to improve her WB function and endurance without pain.    Personal Factors and Comorbidities Comorbidity 1    Comorbidities HTN, obesity, previous THA, previous R sciatica, L RTC    Examination-Activity Limitations Transfers;Squat;Lift;Locomotion Level;Stairs;Stand    Examination-Participation Restrictions Occupation;Community Activity    Stability/Clinical Decision Making Stable/Uncomplicated    Clinical Decision Making Low    Rehab Potential Good    PT Frequency 2x / week    PT Duration 8 weeks    PT Treatment/Interventions ADLs/Self Care Home Management;Cryotherapy;Therapeutic activities;Functional mobility training;Stair training;Gait training;Therapeutic exercise;Neuromuscular re-education;Patient/family education    PT Next Visit Plan Increase AROM, flexibility and strength activities as appropriate.    PT Home Exercise Plan Access Code: [email or web address]    [id number] and Agree with Plan of Care Patient           Patient will benefit from skilled therapeutic intervention in order to improve the following deficits and impairments:  Abnormal gait, Decreased activity tolerance, Decreased endurance, Decreased range of motion, Decreased mobility, Decreased strength, Difficulty walking, Hypomobility, Increased edema, Impaired flexibility, Pain, Obesity  Visit  Diagnosis: Difficulty walking  Stiffness of right hip, not elsewhere classified  Pain in right hip     Problem List Patient Active Problem List   Diagnosis Date Noted   History of total hip replacement 02/01/2020   Need for hepatitis C screening test 01/29/2020   Chronic right shoulder pain 09/04/2019   Primary osteoarthritis involving multiple joints 08/02/2019   Rotator cuff disorder, left 04/17/2019   Irritable bowel syndrome with diarrhea 01/10/2019   Lumbar radiculopathy, right 07/06/2017   Eczema of face 05/26/2017   Routine general medical examination at a health care facility 03/16/2016   RLS (restless legs syndrome) 03/16/2016   Hypertriglyceridemia, essential 04/04/2015   Hyperlipidemia with target LDL less than 100 04/04/2015   Severe obesity (BMI >= 40) (HCC) 09/20/2013   Hyperglycemia 09/30/2011   Hypothyroidism 07/09/2010   Allergic rhinitis due to pollen 06/30/2010   TOBACCO USE 11/28/2009   Depression, major, in remission ([place]) 11/28/2009   Essential hypertension 11/28/2009    Farley Ly PT, MPT 02/19/2020, 11:25 AM  [facility] Physical Therapy [address] [place], [place], [address] Phone: [phone]   Fax:  [phone]  Name: Holly Mcmillan MRN: [id number] Date of Birth: Aug 31, 1970

## 2020-02-19 NOTE — Patient Instructions (Signed)
Access Code: [email or web address] URL: [email or web address] Date: 02/19/2020 Prepared by: Vista Mink  Exercises Single Knee to Chest Stretch - 2-3 x daily - 7 x weekly - 1 sets - 5 reps - 20 seconds hold Supine Figure 4 Piriformis Stretch - 2-3 x daily - 7 x weekly - 1 sets - 5 reps - 20 seconds hold Sidelying Hip Abduction - 2-3 x daily - 7 x weekly - 2 sets - 10 reps - 3 seconds hold

## 2020-02-20 ENCOUNTER — Other Ambulatory Visit: Payer: Self-pay | Admitting: Internal Medicine

## 2020-02-20 ENCOUNTER — Encounter (HOSPITAL_COMMUNITY)
Admission: RE | Admit: 2020-02-20 | Discharge: 2020-02-20 | Disposition: A | Discharge status: Home / Self Care | Payer: 59 | Source: Ambulatory Visit | Attending: Family Medicine | Admitting: Family Medicine

## 2020-02-20 DIAGNOSIS — I10 Essential (primary) hypertension: Secondary | ICD-10-CM

## 2020-02-20 DIAGNOSIS — Z96641 Presence of right artificial hip joint: Secondary | ICD-10-CM

## 2020-02-20 MED ORDER — TECHNETIUM TC 99M MEDRONATE IV KIT
20.0000 | PACK | Freq: Once | INTRAVENOUS | Status: AC | PRN
  Administered 2020-02-20: 20 via INTRAVENOUS

## 2020-02-21 ENCOUNTER — Encounter: Payer: Self-pay | Admitting: Family Medicine

## 2020-02-21 NOTE — Progress Notes (Signed)
Three-phase bone scan does not show changes consistent with loosening of the hip replacement hardware.  Proceed with physical therapy.  We will follow up with each other on November 11 as scheduled.  Recheck sooner if needed.

## 2020-02-27 ENCOUNTER — Encounter: Payer: Self-pay | Admitting: Physical Therapy

## 2020-02-27 ENCOUNTER — Ambulatory Visit (INDEPENDENT_AMBULATORY_CARE_PROVIDER_SITE_OTHER): Payer: 59 | Admitting: Physical Therapy

## 2020-02-27 ENCOUNTER — Other Ambulatory Visit: Payer: Self-pay

## 2020-02-27 ENCOUNTER — Other Ambulatory Visit: Payer: Self-pay | Admitting: Internal Medicine

## 2020-02-27 DIAGNOSIS — R293 Abnormal posture: Secondary | ICD-10-CM

## 2020-02-27 DIAGNOSIS — M25551 Pain in right hip: Secondary | ICD-10-CM

## 2020-02-27 DIAGNOSIS — G8929 Other chronic pain: Secondary | ICD-10-CM

## 2020-02-27 DIAGNOSIS — R252 Cramp and spasm: Secondary | ICD-10-CM

## 2020-02-27 DIAGNOSIS — M25651 Stiffness of right hip, not elsewhere classified: Secondary | ICD-10-CM | POA: Diagnosis not present

## 2020-02-27 DIAGNOSIS — M25511 Pain in right shoulder: Secondary | ICD-10-CM

## 2020-02-27 DIAGNOSIS — F325 Major depressive disorder, single episode, in full remission: Secondary | ICD-10-CM

## 2020-02-27 DIAGNOSIS — R262 Difficulty in walking, not elsewhere classified: Secondary | ICD-10-CM

## 2020-02-27 NOTE — Therapy (Signed)
[facility] Physical Therapy [address] [place], [place], [address] Phone: [phone]   Fax:  [phone]  Physical Therapy Treatment  Patient Details  Name: Holly Mcmillan MRN: [id number] Date of Birth: May 29, 1970 Referring Provider (PT): Holly Hams MD   Encounter Date: 02/27/2020   PT End of Session - 02/27/20 1308    Visit Number 2    Number of Visits 16    PT Start Time 1302    PT Stop Time 1342    PT Time Calculation (min) 40 min    Activity Tolerance Patient tolerated treatment well    Behavior During Therapy [facility] for tasks assessed/performed           Past Medical History:  Diagnosis Date  . Depression   . Dysmenorrhea   . Gallbladder disease   . Headache(784.0)   . Hypertension   . Hypothyroidism   . Obesity   . Ovarian cyst     Past Surgical History:  Procedure Laterality Date  . ablation    . BREAST CYST EXCISION Right 06/24/2016   Procedure: EXCISION RIGHT BREAST CYST;  Surgeon: Coralie Keens, MD;  Location: [place];  Service: General;  Laterality: Right;  . CESAREAN SECTION  2002, 2009  . CHOLECYSTECTOMY  2005  . NASAL SINUS SURGERY    . THYROID SURGERY  2012    There were no vitals filed for this visit.   Subjective Assessment - 02/27/20 1304    Subjective Pt arriving to therapy reporting no pain today at start of session. Pt reports her pain can vary at home and can increase to 9/10 at times. Pt reporting some of her exercises at home seem to make her R foot numb. Pt also reporting waking up in the middle of the night.    Pertinent History HTN, obesity, L RTC, R lumbar radiculopathy, previous R THA    Limitations Standing;Walking    How long can you sit comfortably? Not a problem    How long can you stand comfortably? Not long    How long can you walk comfortably? Short distances within the house    Diagnostic tests Bone scan tomorrow.  X-ray normal for post-THA.    Patient Stated Goals Be able to  stand and walk without pain.    Currently in Pain? No/denies    Pain Location Hip    Pain Orientation Right                             [facility] Adult PT Treatment/Exercise - 02/27/20 0001      Exercises   Exercises Knee/Hip      Knee/Hip Exercises: Stretches   Hip Flexor Stretch Both;5 reps;20 seconds   Single knee to chest with other leg straight   Piriformis Stretch Both;5 reps;20 seconds   1 knee bent with foot on mat, cross ankle over bent knee   Gastroc Stretch Both;3 reps;30 seconds      Knee/Hip Exercises: Aerobic   Nustep L4 x 6 minutes      Knee/Hip Exercises: Standing   Hip ADduction Strengthening;15 reps    Hip ADduction Limitations UE support     Other Standing Knee Exercises mini squats x 15 with UE support      Knee/Hip Exercises: Supine   [name];Both;10 reps;Limitations    Bridges Limitations holding 5 seconds    Other Supine Knee/Hip Exercises clam shells green theraband 2x10  Manual Therapy   Manual Therapy Soft tissue mobilization    Manual therapy comments 10 minutes    Soft tissue mobilization IASTM: R hip, gluteals, superior hamstrings                       PT Long Term Goals - 02/19/20 1118      PT LONG TERM GOAL #1   Title Improve FOTO score to 70.    Baseline 42    Time 8    Period Weeks    Status New    Target Date 04/19/20      PT LONG TERM GOAL #2   Title Improve hip AROM for flexion to 100; IR to 5; ER to 30 and hamstrings to 40 degrees.    Baseline 80; 2; 20 and 30 degrees, respectively    Time 8    Period Weeks    Status New    Target Date 04/19/20      PT LONG TERM GOAL #3   Title Improve R hip strength to 4+/5 MMT or better.    Baseline 2+/3- out of 5 MMT    Time 8    Period Weeks    Status New    Target Date 04/19/20      PT LONG TERM GOAL #4   Title [name] will be able to walk distances of a mile or more without being limited by R hip pain.    Baseline Very painful with walking  around farmers' market or grocery store.    Time 8    Period Weeks    Status New    Target Date 04/19/20      PT LONG TERM GOAL #5   Title [name] will be independent and compliant with her HEP at DC.    Time 8    Period Weeks    Status New    Target Date 04/19/20                 Plan - 02/27/20 1327    Clinical Impression Statement Pt reporting no pain upon arrival, but reporting increased pain throughout her day which can vary. Pt still reporting pain waking her up at night. Pt also reporting numbness in anterior lower leg when performing piriformis stretch. Treatment concentration on LE strengthening, stretching and beginning extension exercises added to pt's HEP. Continue skilled PT.    Comorbidities HTN, obesity, previous THA, previous R sciatica, L RTC    Examination-Activity Limitations Transfers;Squat;Lift;Locomotion Level;Stairs;Stand    Examination-Participation Restrictions Occupation;Community Activity    Stability/Clinical Decision Making Stable/Uncomplicated    Rehab Potential Good    PT Frequency 2x / week    PT Treatment/Interventions ADLs/Self Care Home Management;Cryotherapy;Therapeutic activities;Functional mobility training;Stair training;Gait training;Therapeutic exercise;Neuromuscular re-education;Patient/family education    PT Next Visit Plan Increase AROM, flexibility and strength activities as appropriate.    PT Home Exercise Plan Access Code: [email or web address]    Consulted and Agree with Plan of Care Patient           Patient will benefit from skilled therapeutic intervention in order to improve the following deficits and impairments:  Abnormal gait, Decreased activity tolerance, Decreased endurance, Decreased range of motion, Decreased mobility, Decreased strength, Difficulty walking, Hypomobility, Increased edema, Impaired flexibility, Pain, Obesity  Visit Diagnosis: Difficulty walking  Stiffness of right hip, not elsewhere classified  Pain in right  hip  Chronic right shoulder pain  Cramp and spasm  Abnormal posture     Problem List Patient  Active Problem List   Diagnosis Date Noted  . History of total hip replacement 02/01/2020  . Need for hepatitis C screening test 01/29/2020  . Chronic right shoulder pain 09/04/2019  . Primary osteoarthritis involving multiple joints 08/02/2019  . Rotator cuff disorder, left 04/17/2019  . Irritable bowel syndrome with diarrhea 01/10/2019  . Lumbar radiculopathy, right 07/06/2017  . Eczema of face 05/26/2017  . Routine general medical examination at a health care facility 03/16/2016  . RLS (restless legs syndrome) 03/16/2016  . Hypertriglyceridemia, essential 04/04/2015  . Hyperlipidemia with target LDL less than 100 04/04/2015  . Severe obesity (BMI >= 40) ([place]) 09/20/2013  . Hyperglycemia 09/30/2011  . Hypothyroidism 07/09/2010  . Allergic rhinitis due to pollen 06/30/2010  . TOBACCO USE 11/28/2009  . Depression, major, in remission ([place]) 11/28/2009  . Essential hypertension 11/28/2009    Oretha Caprice, PT, MPT 02/27/2020, 1:49 PM  [facility] Physical Therapy [address] [place], [place], [address] Phone: [phone]   Fax:  [phone]  Name: Holly Mcmillan MRN: [id number] Date of Birth: 01-13-1971

## 2020-03-05 ENCOUNTER — Ambulatory Visit (INDEPENDENT_AMBULATORY_CARE_PROVIDER_SITE_OTHER): Payer: 59 | Admitting: Physical Therapy

## 2020-03-05 ENCOUNTER — Other Ambulatory Visit: Payer: Self-pay

## 2020-03-05 ENCOUNTER — Encounter: Payer: Self-pay | Admitting: Physical Therapy

## 2020-03-05 DIAGNOSIS — M25651 Stiffness of right hip, not elsewhere classified: Secondary | ICD-10-CM | POA: Diagnosis not present

## 2020-03-05 DIAGNOSIS — R262 Difficulty in walking, not elsewhere classified: Secondary | ICD-10-CM

## 2020-03-05 DIAGNOSIS — M25551 Pain in right hip: Secondary | ICD-10-CM | POA: Diagnosis not present

## 2020-03-05 DIAGNOSIS — G8929 Other chronic pain: Secondary | ICD-10-CM

## 2020-03-05 DIAGNOSIS — R293 Abnormal posture: Secondary | ICD-10-CM

## 2020-03-05 DIAGNOSIS — R252 Cramp and spasm: Secondary | ICD-10-CM

## 2020-03-05 DIAGNOSIS — M25511 Pain in right shoulder: Secondary | ICD-10-CM | POA: Diagnosis not present

## 2020-03-05 NOTE — Therapy (Signed)
[facility] Physical Therapy [address] [place], [place], [address] Phone: [phone]   Fax:  [phone]  Physical Therapy Treatment  Patient Details  Name: Holly Mcmillan MRN: [id number] Date of Birth: [birth year] Referring Provider (PT): Gregor Hams MD   Encounter Date: 03/05/2020   PT End of Session - 03/05/20 1205    Visit Number 3    Number of Visits 16    PT Start Time 1448    PT Stop Time 1230    PT Time Calculation (min) 43 min    Activity Tolerance Patient tolerated treatment well    Behavior During Therapy [facility] for tasks assessed/performed           Past Medical History:  Diagnosis Date  . Depression   . Dysmenorrhea   . Gallbladder disease   . Headache(784.0)   . Hypertension   . Hypothyroidism   . Obesity   . Ovarian cyst     Past Surgical History:  Procedure Laterality Date  . ablation    . BREAST CYST EXCISION Right 06/24/2016   Procedure: EXCISION RIGHT BREAST CYST;  Surgeon: Coralie Keens, MD;  Location: [place];  Service: General;  Laterality: Right;  . CESAREAN SECTION  2002, 2009  . CHOLECYSTECTOMY  2005  . NASAL SINUS SURGERY    . THYROID SURGERY  2012    There were no vitals filed for this visit.   Subjective Assessment - 03/05/20 1150    Subjective Pt arriving to therapy reporting 7/10 R gluteal region. Pt still having trouble sleeping at night.    Pertinent History HTN, obesity, L RTC, R lumbar radiculopathy, previous R THA    Limitations Standing;Walking    How long can you sit comfortably? Not a problem    How long can you stand comfortably? Not long    How long can you walk comfortably? Short distances within the house    Diagnostic tests Bone scan tomorrow.  X-ray normal for post-THA.    Patient Stated Goals Be able to stand and walk without pain.    Currently in Pain? Yes    Pain Score 7     Pain Location Hip    Pain Orientation Right    Pain Descriptors / Indicators Aching    Pain  Type Acute pain    Pain Onset More than a month ago                             [facility] Adult PT Treatment/Exercise - 03/05/20 0001      Exercises   Exercises Knee/Hip      Knee/Hip Exercises: Stretches   Hip Flexor Stretch Both;5 reps;20 seconds   modified Thomas position   ITB Stretch Right;2 reps;30 seconds    Piriformis Stretch Both;5 reps;20 seconds   1 knee bent with foot on mat, cross ankle over bent knee   Gastroc Stretch Both;3 reps;30 seconds      Knee/Hip Exercises: Aerobic   Nustep L4 x 8 minutes      Knee/Hip Exercises: Standing   Hip ADduction Strengthening;15 reps    Hip ADduction Limitations UE support       Knee/Hip Exercises: Supine   [name];Both;10 reps;Limitations    Bridges Limitations holding 5 seconds    Other Supine Knee/Hip Exercises clam shells green theraband 2x10      Modalities   Modalities Electrical Stimulation;Moist Heat      Moist Heat Therapy  Number Minutes Moist Heat 15 Minutes    Moist Heat Location Hip      Electrical Stimulation   Electrical Stimulation Location R lateral hip, glutes    Electrical Stimulation Action premodulated    Electrical Stimulation Parameters 80-150 Hz x 15 minutes     Electrical Stimulation Goals Pain                       PT Long Term Goals - 03/05/20 1220      PT LONG TERM GOAL #1   Title Improve FOTO score to 70.    Period Weeks    Status On-going      PT LONG TERM GOAL #2   Title Improve hip AROM for flexion to 100; IR to 5; ER to 30 and hamstrings to 40 degrees.    Baseline 80; 2; 20 and 30 degrees, respectively    Status On-going      PT LONG TERM GOAL #3   Title Improve R hip strength to 4+/5 MMT or better.    Status On-going      PT LONG TERM GOAL #4   Title Aeliana will be able to walk distances of a mile or more without being limited by R hip pain.    Status On-going      PT LONG TERM GOAL #5   Title Myesha will be independent and compliant with  her HEP at DC.    Status On-going                 Plan - 03/05/20 1209    Clinical Impression Statement Pt arriving to therapy today reporting 7/10 pain in R hip. Pt still reporting increased pain at night and difficulty sleeping. Pt still reporting mild numbness into R thigh at times at home. We added pre-modulated E-stim to pt's R hip today with noraml response. Pt reporting less pain at end of session of 5-6/10. Continue skilled PT.    Personal Factors and Comorbidities Comorbidity 1    Comorbidities HTN, obesity, previous THA, previous R sciatica, L RTC    Examination-Activity Limitations Transfers;Squat;Lift;Locomotion Level;Stairs;Stand    Examination-Participation Restrictions Occupation;Community Activity    Stability/Clinical Decision Making Stable/Uncomplicated    Rehab Potential Good    PT Frequency 2x / week    PT Treatment/Interventions ADLs/Self Care Home Management;Cryotherapy;Therapeutic activities;Functional mobility training;Stair training;Gait training;Therapeutic exercise;Neuromuscular re-education;Patient/family education    PT Next Visit Plan Increase AROM, flexibility and strength activities as appropriate.    PT Home Exercise Plan Access Code: [email or web address]    Consulted and Agree with Plan of Care Patient           Patient will benefit from skilled therapeutic intervention in order to improve the following deficits and impairments:  Abnormal gait, Decreased activity tolerance, Decreased endurance, Decreased range of motion, Decreased mobility, Decreased strength, Difficulty walking, Hypomobility, Increased edema, Impaired flexibility, Pain, Obesity  Visit Diagnosis: Difficulty walking  Stiffness of right hip, not elsewhere classified  Pain in right hip  Chronic right shoulder pain  Cramp and spasm  Abnormal posture     Problem List Patient Active Problem List   Diagnosis Date Noted  . History of total hip replacement 02/01/2020  . Need for  hepatitis C screening test 01/29/2020  . Chronic right shoulder pain 09/04/2019  . Primary osteoarthritis involving multiple joints 08/02/2019  . Rotator cuff disorder, left 04/17/2019  . Irritable bowel syndrome with diarrhea 01/10/2019  . Lumbar radiculopathy, right 07/06/2017  . Eczema  of face 05/26/2017  . Routine general medical examination at a health care facility 03/16/2016  . RLS (restless legs syndrome) 03/16/2016  . Hypertriglyceridemia, essential 04/04/2015  . Hyperlipidemia with target LDL less than 100 04/04/2015  . Severe obesity (BMI >= 40) ([place]) 09/20/2013  . Hyperglycemia 09/30/2011  . Hypothyroidism 07/09/2010  . Allergic rhinitis due to pollen 06/30/2010  . TOBACCO USE 11/28/2009  . Depression, major, in remission ([place]) 11/28/2009  . Essential hypertension 11/28/2009    Evelene Croon, MPT  03/05/2020, 12:21 PM  [facility] Physical Therapy [address] [place], [place], [address] Phone: [phone]   Fax:  [phone]  Name: ISEBELLA UPSHUR MRN: [id number] Date of Birth: Dec 19, 1970

## 2020-03-07 ENCOUNTER — Ambulatory Visit (INDEPENDENT_AMBULATORY_CARE_PROVIDER_SITE_OTHER): Payer: 59 | Admitting: Rehabilitative and Restorative Service Providers"

## 2020-03-07 ENCOUNTER — Encounter: Payer: Self-pay | Admitting: Rehabilitative and Restorative Service Providers"

## 2020-03-07 ENCOUNTER — Other Ambulatory Visit: Payer: Self-pay

## 2020-03-07 DIAGNOSIS — M25651 Stiffness of right hip, not elsewhere classified: Secondary | ICD-10-CM | POA: Diagnosis not present

## 2020-03-07 DIAGNOSIS — M25551 Pain in right hip: Secondary | ICD-10-CM | POA: Diagnosis not present

## 2020-03-07 DIAGNOSIS — R262 Difficulty in walking, not elsewhere classified: Secondary | ICD-10-CM

## 2020-03-07 DIAGNOSIS — M5416 Radiculopathy, lumbar region: Secondary | ICD-10-CM

## 2020-03-07 DIAGNOSIS — R293 Abnormal posture: Secondary | ICD-10-CM | POA: Diagnosis not present

## 2020-03-07 NOTE — Patient Instructions (Signed)
Access Code: [email or web address] URL: [email or web address] Date: 03/07/2020 Prepared by: Vista Mink  Exercises Single Knee to Chest Stretch - 2-3 x daily - 7 x weekly - 1 sets - 5 reps - 20 seconds hold Supine Figure 4 Piriformis Stretch - 2-3 x daily - 7 x weekly - 1 sets - 5 reps - 20 seconds hold Sidelying Hip Abduction - 2-3 x daily - 7 x weekly - 2 sets - 10 reps - 3 seconds hold Standing Lumbar Extension at Johnson - 5 x daily - 7 x weekly - 1 sets - 5 reps - 3 seconds hold Prone Hip Extension - 1 x daily - 7 x weekly - 2 sets - 10 reps - 3 seconds hold Prone Alternating Arm and Leg Lifts - 1 x daily - 7 x weekly - 2 sets - 10 reps - 3-10 seconds hold

## 2020-03-07 NOTE — Therapy (Signed)
[facility] Physical Therapy [address] [place], [place], [address] Phone: [phone]   Fax:  [phone]  Physical Therapy Treatment/Reassessment  Patient Details  Name: Holly Mcmillan MRN: [id number] Date of Birth: [birth year] Referring Provider (PT): Gregor Hams MD   Encounter Date: 03/07/2020   PT End of Session - 03/07/20 1508    Visit Number 4    Number of Visits 16    PT Start Time 4709    PT Stop Time 1228    PT Time Calculation (min) 43 min    Activity Tolerance Patient tolerated treatment well    Behavior During Therapy [facility] for tasks assessed/performed           Past Medical History:  Diagnosis Date  . Depression   . Dysmenorrhea   . Gallbladder disease   . Headache(784.0)   . Hypertension   . Hypothyroidism   . Obesity   . Ovarian cyst     Past Surgical History:  Procedure Laterality Date  . ablation    . BREAST CYST EXCISION Right 06/24/2016   Procedure: EXCISION RIGHT BREAST CYST;  Surgeon: Coralie Keens, MD;  Location: [place];  Service: General;  Laterality: Right;  . CESAREAN SECTION  2002, 2009  . CHOLECYSTECTOMY  2005  . NASAL SINUS SURGERY    . THYROID SURGERY  2012    There were no vitals filed for this visit.   Subjective Assessment - 03/07/20 1502    Subjective Holly Mcmillan notes prolonged standing and sleeping have been difficult this week.    Pertinent History HTN, obesity, L RTC, R lumbar radiculopathy, previous R THA    Limitations Standing;Walking    How long can you sit comfortably? Not a problem    How long can you stand comfortably? Not long    How long can you walk comfortably? Short distances within the house    Diagnostic tests Bone scan tomorrow.  X-ray normal for post-THA.    Patient Stated Goals Be able to stand and walk without pain.    Currently in Pain? Yes    Pain Score 7     Pain Location Hip    Pain Orientation Right    Pain Descriptors / Indicators Burning;Tightness;Sore     Pain Type Chronic pain    Pain Radiating Towards As distal as the foot    Pain Onset More than a month ago    Pain Frequency Intermittent    Aggravating Factors  Prolonged standing and prolonged postures    Pain Relieving Factors Change of position    Effect of Pain on Daily Activities Sleep and standing are affected    Multiple Pain Sites No              [facility] PT Assessment - 03/07/20 0001      AROM   Right Hip Flexion 90    Right Hip External Rotation  25    Right Hip Internal Rotation  10    Left Hip Flexion 90    Left Hip External Rotation  32    Left Hip Internal Rotation  8      Flexibility   Hamstrings 50 degrees/45 degrees                         [facility] Adult PT Treatment/Exercise - 03/07/20 0001      Exercises   Exercises Lumbar;Knee/Hip      Lumbar Exercises: Prone   Opposite Arm/Leg Raise  10 reps;3 seconds;Left arm/Right leg;Right arm/Left leg    Other Prone Lumbar Exercises Prone alternating hip extensions 10X 3 seconds      Knee/Hip Exercises: Stretches   Hip Flexor Stretch Both;5 reps;20 seconds   Single knee to chest with other leg straight   Piriformis Stretch Both;5 reps;20 seconds   1 knee bent with foot on mat, cross ankle over bent knee     Knee/Hip Exercises: Standing   Hip Abduction Stengthening;Both;2 sets;10 reps   Alternating hip hike   Other Standing Knee Exercises Standing trunk extension AROM 10X 3 seconds      Knee/Hip Exercises: Sidelying   Hip ABduction Strengthening;Right;2 sets;10 reps   3 seconds; 1/4 turn to stomach                 PT Education - 03/07/20 1506    Education Details Reviewed and updated HEP.  Spent quite a bit of time discussing hip and spine anatomy, strong possibility of a lumbar radiculopathy and recommended course of treatment including following-up with Dr. Ronnald Ramp about possible lumbar work-up.    Person(s) Educated Patient    Methods Explanation;Demonstration;Verbal cues;Handout     Comprehension Verbal cues required;Need further instruction;Verbalized understanding;Returned demonstration               PT Long Term Goals - 03/07/20 1508      PT LONG TERM GOAL #1   Title Improve FOTO score to 70.    Period Weeks    Status On-going      PT LONG TERM GOAL #2   Title Improve hip AROM for flexion to 100; IR to 5; ER to 30 and hamstrings to 40 degrees.    Baseline 80; 2; 20 and 30 degrees, respectively    Status On-going      PT LONG TERM GOAL #3   Title Improve R hip strength to 4+/5 MMT or better.    Status On-going      PT LONG TERM GOAL #4   Title Holly Mcmillan will be able to walk distances of a mile or more without being limited by R hip pain.    Status On-going      PT LONG TERM GOAL #5   Title Holly Mcmillan will be independent and compliant with her HEP at DC.    Status On-going                 Plan - 03/07/20 1329    Clinical Impression Statement Holly Mcmillan appears to have multiple impairments.  Atrophy, stiffness and weakness of the R hip and chronic low back pain (with previous radiculopathy).  I suggest her R hip replacement had her feeling good enough to resume some normal activities that she wasn't quite ready to return to (including some activities at the gym that are inappropriate for someone with her medical history).  I progressed her core strengthening along with her current hip program to address this.  Objectively, she is moving better than at evaluation 2 weeks ago.  She still has a lateral lean with gait (hip abductors weakness) and obvious spine stiffness and weakness noted with therapeutic activities.  She will likely benefit from the recommended course of rehabilitation.    Personal Factors and Comorbidities Comorbidity 1    Comorbidities HTN, obesity, previous THA, previous R sciatica, L RTC    Examination-Activity Limitations Transfers;Squat;Lift;Locomotion Level;Stairs;Stand    Examination-Participation Restrictions Occupation;Community Activity     Stability/Clinical Decision Making Stable/Uncomplicated    Rehab Potential Good  PT Frequency 2x / week    PT Duration 6 weeks    PT Treatment/Interventions ADLs/Self Care Home Management;Cryotherapy;Therapeutic activities;Functional mobility training;Stair training;Gait training;Therapeutic exercise;Neuromuscular re-education;Patient/family education    PT Next Visit Plan Increase AROM, flexibility and strength activities as appropriate.    PT Home Exercise Plan Access Code: [email or web address]    Consulted and Agree with Plan of Care Patient           Patient will benefit from skilled therapeutic intervention in order to improve the following deficits and impairments:  Abnormal gait, Decreased activity tolerance, Decreased endurance, Decreased range of motion, Decreased mobility, Decreased strength, Difficulty walking, Hypomobility, Increased edema, Impaired flexibility, Pain, Obesity  Visit Diagnosis: Difficulty walking  Stiffness of right hip, not elsewhere classified  Pain in right hip  Abnormal posture  Radiculopathy, lumbar region     Problem List Patient Active Problem List   Diagnosis Date Noted  . History of total hip replacement 02/01/2020  . Need for hepatitis C screening test 01/29/2020  . Chronic right shoulder pain 09/04/2019  . Primary osteoarthritis involving multiple joints 08/02/2019  . Rotator cuff disorder, left 04/17/2019  . Irritable bowel syndrome with diarrhea 01/10/2019  . Lumbar radiculopathy, right 07/06/2017  . Eczema of face 05/26/2017  . Routine general medical examination at a health care facility 03/16/2016  . RLS (restless legs syndrome) 03/16/2016  . Hypertriglyceridemia, essential 04/04/2015  . Hyperlipidemia with target LDL less than 100 04/04/2015  . Severe obesity (BMI >= 40) ([place]) 09/20/2013  . Hyperglycemia 09/30/2011  . Hypothyroidism 07/09/2010  . Allergic rhinitis due to pollen 06/30/2010  . TOBACCO USE 11/28/2009  . Depression,  major, in remission ([place]) 11/28/2009  . Essential hypertension 11/28/2009    Farley Ly PT, MPT 03/07/2020, 3:14 PM  [facility] Physical Therapy [address] [place], [place], [address] Phone: [phone]   Fax:  [phone]  Name: Holly Mcmillan MRN: [id number] Date of Birth: 02-28-1971

## 2020-03-11 ENCOUNTER — Encounter (HOSPITAL_COMMUNITY): Payer: 59

## 2020-03-11 ENCOUNTER — Other Ambulatory Visit (HOSPITAL_COMMUNITY): Payer: 59

## 2020-03-12 ENCOUNTER — Other Ambulatory Visit: Payer: Self-pay

## 2020-03-12 ENCOUNTER — Ambulatory Visit (INDEPENDENT_AMBULATORY_CARE_PROVIDER_SITE_OTHER): Payer: 59 | Admitting: Physical Therapy

## 2020-03-12 ENCOUNTER — Encounter: Payer: Self-pay | Admitting: Physical Therapy

## 2020-03-12 DIAGNOSIS — R252 Cramp and spasm: Secondary | ICD-10-CM

## 2020-03-12 DIAGNOSIS — M5416 Radiculopathy, lumbar region: Secondary | ICD-10-CM

## 2020-03-12 DIAGNOSIS — M25551 Pain in right hip: Secondary | ICD-10-CM | POA: Diagnosis not present

## 2020-03-12 DIAGNOSIS — R262 Difficulty in walking, not elsewhere classified: Secondary | ICD-10-CM | POA: Diagnosis not present

## 2020-03-12 DIAGNOSIS — M25651 Stiffness of right hip, not elsewhere classified: Secondary | ICD-10-CM

## 2020-03-12 DIAGNOSIS — R293 Abnormal posture: Secondary | ICD-10-CM | POA: Diagnosis not present

## 2020-03-12 DIAGNOSIS — G8929 Other chronic pain: Secondary | ICD-10-CM

## 2020-03-12 DIAGNOSIS — M25511 Pain in right shoulder: Secondary | ICD-10-CM

## 2020-03-12 NOTE — Therapy (Signed)
[facility] Physical Therapy [address] [place], [place], [address] Phone: [phone]   Fax:  [phone]  Physical Therapy Treatment  Patient Details  Name: Holly Mcmillan MRN: [id number] Date of Birth: [birth year] Referring Provider (PT): Gregor Hams MD   Encounter Date: 03/12/2020   PT End of Session - 03/12/20 1218    Visit Number 5    Number of Visits 16    PT Start Time 7619    PT Stop Time 5093    PT Time Calculation (min) 42 min    Activity Tolerance Patient tolerated treatment well    Behavior During Therapy [facility] for tasks assessed/performed           Past Medical History:  Diagnosis Date   Depression    Dysmenorrhea    Gallbladder disease    Headache(784.0)    Hypertension    Hypothyroidism    Obesity    Ovarian cyst     Past Surgical History:  Procedure Laterality Date   ablation     BREAST CYST EXCISION Right 06/24/2016   Procedure: EXCISION RIGHT BREAST CYST;  Surgeon: Coralie Keens, MD;  Location: [place];  Service: General;  Laterality: Right;   CESAREAN SECTION  2002, 2009   CHOLECYSTECTOMY  2005   NASAL SINUS SURGERY     THYROID SURGERY  2012    There were no vitals filed for this visit.   Subjective Assessment - 03/12/20 1148    Subjective Pt arriving to therapy today reporting 7/10 pain in R thigh and buttock.    Pertinent History HTN, obesity, L RTC, R lumbar radiculopathy, previous R THA    Limitations Standing;Walking    How long can you walk comfortably? Short distances within the house    Diagnostic tests Bone scan tomorrow.  X-ray normal for post-THA.    Patient Stated Goals Be able to stand and walk without pain.    Currently in Pain? Yes    Pain Score 7     Pain Location Hip    Pain Orientation Right    Pain Descriptors / Indicators Aching;Tightness;Sore    Pain Type Chronic pain    Pain Onset More than a month ago    Pain Frequency Intermittent                              [facility] Adult PT Treatment/Exercise - 03/12/20 0001      Exercises   Exercises Lumbar;Knee/Hip      Lumbar Exercises: Prone   Other Prone Lumbar Exercises Prone alternating hip extensions 10X 3 seconds      Lumbar Exercises: Quadruped   [place] Horse 10 reps    Opposite Arm/Leg Raise Right arm/Left leg;Left arm/Right leg;10 reps    Opposite Arm/Leg Raise Limitations 3-5 secon holds      Knee/Hip Exercises: Stretches   Hip Flexor Stretch Both;5 reps;20 seconds   Single knee to chest with other leg straight   Piriformis Stretch Both;5 reps;20 seconds   1 knee bent with foot on mat, cross ankle over bent knee     Knee/Hip Exercises: Aerobic   Recumbent Bike L2 x 3 minutes, pt reporting soreness in R hip today      Knee/Hip Exercises: Supine   Bridges Strengthening;Both;10 reps    Bridges Limitations single leg lift x 10 alternating      Manual Therapy   Manual therapy comments Percussion using hypervolt to R hip,  IT band, pirifrmos and superior hamstring tendson    Soft tissue mobilization active trigger point release in R piriformis                       PT Long Term Goals - 03/12/20 1221      PT LONG TERM GOAL #1   Title Improve FOTO score to 70.    Baseline 42    Status On-going      PT LONG TERM GOAL #2   Title Improve hip AROM for flexion to 100; IR to 5; ER to 30 and hamstrings to 40 degrees.    Baseline 80; 2; 20 and 30 degrees, respectively    Status On-going      PT LONG TERM GOAL #3   Title Improve R hip strength to 4+/5 MMT or better.    Status On-going      PT LONG TERM GOAL #4   Title Holly Mcmillan will be able to walk distances of a mile or more without being limited by R hip pain.    Baseline Very painful with walking around farmers' market or grocery store.    Status On-going      PT LONG TERM GOAL #5   Title Holly Mcmillan will be independent and compliant with her HEP at DC.    Status On-going                  Plan - 03/12/20 1203    Clinical Impression Statement Pt tolerating exercises well with reporting fatigue during exercies. Pt reporting pressure on R hip and sharp pain reported in R thigh. Performed percussion over pt's lateral hip, IT band, pirifromis and superior hamstring tendons in supine and s/l positions. Pt reporting less pain in supine reporting 3/10. Continne to progress with core strengthening and R hip stretching/ strengthening. Pt has a follow up with Dr. [name] on Thursday 03/14/2020.    Personal Factors and Comorbidities Comorbidity 1    Comorbidities HTN, obesity, previous THA, previous R sciatica, L RTC    Examination-Activity Limitations Transfers;Squat;Lift;Locomotion Level;Stairs;Stand    Examination-Participation Restrictions Occupation;Community Activity    Stability/Clinical Decision Making Stable/Uncomplicated    Rehab Potential Good    PT Frequency 2x / week    PT Duration 6 weeks    PT Treatment/Interventions ADLs/Self Care Home Management;Cryotherapy;Therapeutic activities;Functional mobility training;Stair training;Gait training;Therapeutic exercise;Neuromuscular re-education;Patient/family education    PT Next Visit Plan Increase AROM, flexibility and strength activities as appropriate.    PT Home Exercise Plan Access Code: [email or web address]    Consulted and Agree with Plan of Care Patient           Patient will benefit from skilled therapeutic intervention in order to improve the following deficits and impairments:  Abnormal gait, Decreased activity tolerance, Decreased endurance, Decreased range of motion, Decreased mobility, Decreased strength, Difficulty walking, Hypomobility, Increased edema, Impaired flexibility, Pain, Obesity  Visit Diagnosis: Difficulty walking  Stiffness of right hip, not elsewhere classified  Pain in right hip  Abnormal posture  Radiculopathy, lumbar region  Chronic right shoulder pain  Cramp and spasm     Problem  List Patient Active Problem List   Diagnosis Date Noted   History of total hip replacement 02/01/2020   Need for hepatitis C screening test 01/29/2020   Chronic right shoulder pain 09/04/2019   Primary osteoarthritis involving multiple joints 08/02/2019   Rotator cuff disorder, left 04/17/2019   Irritable bowel syndrome with diarrhea 01/10/2019   Lumbar radiculopathy, right 07/06/2017  Eczema of face 05/26/2017   Routine general medical examination at a health care facility 03/16/2016   RLS (restless legs syndrome) 03/16/2016   Hypertriglyceridemia, essential 04/04/2015   Hyperlipidemia with target LDL less than 100 04/04/2015   Severe obesity (BMI >= 40) ([place]) 09/20/2013   Hyperglycemia 09/30/2011   Hypothyroidism 07/09/2010   Allergic rhinitis due to pollen 06/30/2010   TOBACCO USE 11/28/2009   Depression, major, in remission ([place]) 11/28/2009   Essential hypertension 11/28/2009    Oretha Caprice, PT,MPT 03/12/2020, 12:35 PM  [facility] Physical Therapy [address] [place], [place], [address] Phone: [phone]   Fax:  [phone]  Name: Holly Mcmillan MRN: [id number] Date of Birth: [birth year]

## 2020-03-13 NOTE — Progress Notes (Signed)
I, Holly Mcmillan, LAT, ATC, am serving as scribe for Dr. Lynne Mcmillan.  Holly Mcmillan is a 49 y.o. female who presents to [name] at [facility] today for f/u of R hip pain.  She was last seen by Dr. Georgina Mcmillan on 02/01/20 and was having difficulty w/ weight bearing on her R LE.  She was referred to PT and has completed 5 visits.  Since her last visit with Dr. Georgina Mcmillan, pt reports no change in her R hip pain.  She locates her pain mainly in her R lateral hip w/ some associated pain in her R groin and R IT area.  She also notes some pain and paresthesias in her R lower leg.    Diagnostic imaging: LE 3 phase bone scan- 02/20/20;  R hip XR- 02/01/20  Pertinent review of systems: No fever or chills  Relevant historical information: Hx rt THR. Scoliosis Lspine   Exam:  BP 140/90 (BP Location: Right Arm, Patient Position: Sitting, Cuff Size: Normal)   Ht 5\' 2"  (1.575 m)   Wt 191 lb 9.6 oz (86.9 kg)   BMI 35.04 kg/m  General: Well Developed, well nourished, and in no acute distress.   MSK: Right hip. TTP greater trochanter. Normal gait.     Lab and Radiology Results NM Bone Scan 3 Phase Lower Extremity  Result Date: 02/20/2020 CLINICAL DATA:  RIGHT hip pain since August 2021, underwent RIGHT hip arthroplasty in 2019 EXAM: NUCLEAR MEDICINE 3-PHASE BONE SCAN TECHNIQUE: Radionuclide angiographic images, immediate static blood pool images, and 3-hour delayed static images were obtained of the hips after intravenous injection of radiopharmaceutical. RADIOPHARMACEUTICALS:  21.8 mCi Tc-69m MDP IV COMPARISON:  None Correlation: RIGHT hip radiographs 02/01/2020 FINDINGS: Vascular phase: Normal blood flow to both hips Blood pool phase: Normal blood pool at both hips Delayed phase: Photopenic defect at RIGHT hip from hip prosthesis. Very minimal uptake of tracer at the RIGHT greater trochanter. The observed pattern of uptake could be related to prior surgery or trauma is not typical of aseptic  loosening. No abnormal tracer accumulation at the LEFT hip, visualized pelvis, or remaining femora. IMPRESSION: Minimal nonspecific tracer accumulation at the RIGHT greater trochanter. No scintigraphic evidence of aseptic loosening of the RIGHT hip prosthesis. Electronically Signed   By: Lavonia Dana M.D.   On: 02/20/2020 13:42   DG HIP UNILAT WITH PELVIS 2-3 VIEWS RIGHT  Result Date: 02/02/2020 CLINICAL DATA:  Hip pain for 2 months. History of total replacement. EXAM: DG HIP (WITH OR WITHOUT PELVIS) 2-3V RIGHT COMPARISON:  11/17/2017 FINDINGS: Right hip arthroplasty, without acute hardware complication. Left hip joint space maintained. Sacroiliac joints are symmetric. IMPRESSION: Expected appearance after right hip arthroplasty. No acute findings. Electronically Signed   By: Abigail Miyamoto M.D.   On: 02/02/2020 14:32   I, Holly Mcmillan, personally (independently) visualized and performed the interpretation of the images attached in this note.  Xray images Lspine obtained today personally and independently interpreted Images compared to C-spine images obtained during CT scan abdomen and pelvis September 2020. Scoliosis convex right.  Significant DDD with spur formation L2-L3 worse than the right.  No fractures. Await formal radiology review    Assessment and Plan: 49 y.o. female with right hip and leg pain.  Multifactorial.  Patient has lateral hip pain and anterior groin and thigh pain as well as some pain more distally down her leg associated with some paresthesias.  She is had a nextensive work-up so far including x-ray of the  hip showing intact.  Total hip replacement hardware and and even a bone scan which did not show evidence of infection or hardware loosening.  The bone scan did indicate probable trochanteric bursitis.  Differential also includes lumbar radiculopathy at L2 or L3 which corresponds to scoliosis and degenerative spine changes seen on x-ray today and CT scan of her lumbar spine on  abdomen and pelvis imaging September 2020.  After discussion with patient we will further characterize potential causes of pain with MRI of her lumbar spine and MRI of her hip with and hip replacement protocol.  Recheck following MRI.  MRI indicated for potential injection or surgical planning.   PDMP not reviewed this encounter. Orders Placed This Encounter  Procedures  . [place] LIMITED JOINT SPACE STRUCTURES LOW RIGHT(NO LINKED CHARGES)    Order Specific Question:   Reason for Exam (SYMPTOM  OR DIAGNOSIS REQUIRED)    Answer:   R hip pain    Order Specific Question:   Preferred imaging location?    Answer:   [facility]  . DG Lumbar Spine 2-3 Views    Standing Status:   Future    Number of Occurrences:   1    Standing Expiration Date:   03/14/2021    Order Specific Question:   Reason for Exam (SYMPTOM  OR DIAGNOSIS REQUIRED)    Answer:   eval poss rt L2 radiculopathy    Order Specific Question:   Is patient pregnant?    Answer:   No    Order Specific Question:   Preferred imaging location?    Answer:   [facility]  . MR Lumbar Spine Wo Contrast    Standing Status:   Future    Standing Expiration Date:   03/14/2021    Order Specific Question:   What is the patient's sedation requirement?    Answer:   No Sedation    Order Specific Question:   Does the patient have a pacemaker or implanted devices?    Answer:   No    Order Specific Question:   Preferred imaging location?    Answer:   [facility] (table limit-350lbs)  . MR HIP RIGHT WO CONTRAST    Hip replacement protocol.    Standing Status:   Future    Standing Expiration Date:   03/14/2021    Order Specific Question:   What is the patient's sedation requirement?    Answer:   No Sedation    Order Specific Question:   Does the patient have a pacemaker or implanted devices?    Answer:   No    Order Specific Question:   Preferred imaging location?    Answer:   [facility]  (table limit-350lbs)   No orders of the defined types were placed in this encounter.    Discussed warning signs or symptoms. Please see discharge instructions. Patient expresses understanding.   The above documentation has been reviewed and is accurate and complete Holly Mcmillan, M.D.

## 2020-03-14 ENCOUNTER — Ambulatory Visit: Payer: Self-pay

## 2020-03-14 ENCOUNTER — Ambulatory Visit (INDEPENDENT_AMBULATORY_CARE_PROVIDER_SITE_OTHER): Payer: 59 | Admitting: Family Medicine

## 2020-03-14 ENCOUNTER — Encounter: Payer: Self-pay | Admitting: Family Medicine

## 2020-03-14 ENCOUNTER — Ambulatory Visit (INDEPENDENT_AMBULATORY_CARE_PROVIDER_SITE_OTHER): Payer: 59

## 2020-03-14 ENCOUNTER — Other Ambulatory Visit: Payer: Self-pay

## 2020-03-14 VITALS — BP 140/90 | Ht 62.0 in | Wt 191.6 lb

## 2020-03-14 DIAGNOSIS — M25551 Pain in right hip: Secondary | ICD-10-CM

## 2020-03-14 DIAGNOSIS — M79604 Pain in right leg: Secondary | ICD-10-CM

## 2020-03-14 DIAGNOSIS — Z96641 Presence of right artificial hip joint: Secondary | ICD-10-CM

## 2020-03-14 DIAGNOSIS — M419 Scoliosis, unspecified: Secondary | ICD-10-CM

## 2020-03-14 NOTE — Patient Instructions (Addendum)
Thank you for coming in today.  Plan for MRI of the hip and lumbar spine.   You should hear about approval soon.   Please get an Xray today before you leave  Recheck following MRI>   Please let me know if you do not hear about scheduling in 1 week.

## 2020-03-15 NOTE — Progress Notes (Signed)
X-ray lumbar spine shows arthritis and scoliosis at L2-3.  This certainly could cause a pinched nerve that produces right anterior thigh pain.  MRI lumbar spine should help characterize this further.

## 2020-03-17 ENCOUNTER — Ambulatory Visit (INDEPENDENT_AMBULATORY_CARE_PROVIDER_SITE_OTHER): Payer: 59

## 2020-03-17 ENCOUNTER — Other Ambulatory Visit: Payer: Self-pay

## 2020-03-17 DIAGNOSIS — M48061 Spinal stenosis, lumbar region without neurogenic claudication: Secondary | ICD-10-CM | POA: Diagnosis not present

## 2020-03-17 DIAGNOSIS — M5136 Other intervertebral disc degeneration, lumbar region: Secondary | ICD-10-CM

## 2020-03-17 DIAGNOSIS — M5126 Other intervertebral disc displacement, lumbar region: Secondary | ICD-10-CM

## 2020-03-17 DIAGNOSIS — M79604 Pain in right leg: Secondary | ICD-10-CM

## 2020-03-17 DIAGNOSIS — Z96641 Presence of right artificial hip joint: Secondary | ICD-10-CM | POA: Diagnosis not present

## 2020-03-17 DIAGNOSIS — M25551 Pain in right hip: Secondary | ICD-10-CM

## 2020-03-18 NOTE — Progress Notes (Signed)
MRI of the hip does show some tendinitis of the gluteus medius tendon which could explain some of the pain on the side of the hip.  Hip MRI results plus lumbar spine results I think explain the pain you have been having which should make it easier to treat.  Schedule follow-up appointment to go over both MRIs.

## 2020-03-18 NOTE — Progress Notes (Signed)
MRI lumbar spine shows severe right L3 nerve root impingement and stenosis.  This could certainly cause the groin and anterior thigh pain that you are experiencing.  Hip MRI is still pending.  Recommend scheduling a follow-up appointment with me tomorrow or the next day or sometime after that to review both MRI findings.

## 2020-03-18 NOTE — Progress Notes (Signed)
I, Peterson Lombard, LAT, ATC acting as a scribe for Holly Leader, MD.  Holly Mcmillan is a 49 y.o. female who presents to [place] at [facility] today for f/u chronic R hip pn and MRI review. Prior dx: probable trochanteric bursitis and lumbar radiculopathy at L2 or L3/degenerative spine change.Pt was last seen by Dr. [name] on 03/14/20 and was advised to get a MRI and recheck for possible injection or surgical planning. Today, pt reports major increased pain during MRI from waist down. Excuritating pn. Pn located in R LB, R buttock, into groin and leg.  Location of pain Numbness/tingling yes- along anterior aspect Rx tried: ice  Dx imaging: 03/17/20 R hip MRI          03/17/20 L-spine MRI           02/01/20 R hip XR  Pertinent review of systems: No fevers or chills  Relevant historical information: Hypertension   Exam:  BP 128/84   Pulse 71   Ht 5\' 2"  (1.575 m)   Wt 190 lb 12.8 oz (86.5 kg)   SpO2 96%   BMI 34.90 kg/m  General: Well Developed, well nourished, and in no acute distress.   MSK: Antalgic gait    Lab and Radiology Results No results found for this or any previous visit (from the past 72 hour(s)). MR Lumbar Spine Wo Contrast  Result Date: 03/17/2020 CLINICAL DATA:  Lumbar radiculopathy. Right hip and leg pain for 3 months. EXAM: MRI LUMBAR SPINE WITHOUT CONTRAST TECHNIQUE: Multiplanar, multisequence MR imaging of the lumbar spine was performed. No intravenous contrast was administered. COMPARISON:  07/18/2017 FINDINGS: Segmentation:  Standard. Alignment:  Moderate lumbar levoscoliosis.  No listhesis. Vertebrae: No fracture, suspicious osseous lesion, or significant marrow edema. Chronic asymmetric right-sided degenerative endplate changes at F0-2. Conus medullaris and cauda equina: Conus extends to the L1 level. Conus and cauda equina appear normal. Paraspinal and other soft tissues: 4 mm T2 hyperintense focus in the left kidney, likely a cyst. Disc  levels: Disc desiccation from L1-2 to L4-5 with chronic severe right-sided disc space narrowing at L2-3 and mild narrowing at L3-4 and L4-5. T12-L1 and L1-2: Mild facet hypertrophy without disc herniation or stenosis. L2-3: Disc bulging eccentric to the right, a moderately large right subarticular disc protrusion, mild facet and ligamentum flavum hypertrophy, and severe right-sided disc space height loss result in mild spinal stenosis, severe right lateral recess stenosis, and moderate right neural foraminal stenosis. The disc protrusion has enlarged with greatly increased right lateral recess stenosis and right L3 nerve root impingement. L3-4: Disc bulging eccentric to the left and moderate facet and ligamentum flavum hypertrophy result in mild right and mild-to-moderate left neural foraminal stenosis, slightly progressed. No spinal stenosis. L4-5: Progressive disc bulging, increased size of a left paracentral and left subarticular disc protrusion, a new right foraminal disc protrusion, and moderate left greater than right facet and ligamentum flavum hypertrophy result in moderate spinal stenosis, moderate right and severe left lateral recess stenosis, and moderate bilateral neural foraminal stenosis. Potential neural impingement bilaterally, particularly of the left L5 nerve root. L5-S1: New minimal disc bulging and mild to moderate facet hypertrophy without stenosis. IMPRESSION: 1. Progressive lumbar disc degeneration, most notable at L2-3 where a moderately large disc protrusion results in severe right lateral recess stenosis and right L3 nerve root impingement. 2. Moderate spinal stenosis, severe left lateral recess stenosis, and moderate bilateral neural foraminal stenosis at L4-5. Electronically Signed   By: Logan Bores  M.D.   On: 03/17/2020 18:36   MR HIP RIGHT WO CONTRAST  Result Date: 03/18/2020 CLINICAL DATA:  Radicular right hip pain for 3 months. History of right total hip arthroplasty EXAM: MR OF  THE RIGHT HIP WITHOUT CONTRAST TECHNIQUE: Multiplanar, multisequence MR imaging was performed. No intravenous contrast was administered. COMPARISON:  X-ray 02/01/2020 FINDINGS: Bones: Status post right total hip arthroplasty. Arthroplasty components are in their expected alignment without dislocation. No periprosthetic fracture or marrow edema. Bone marrow signal within the visualized pelvis and proximal left femur are within normal limits. No suspicious marrow replacing lesion. Joint or bursal effusion Joint effusion: No joint effusion or periprosthetic fluid collection. Bursae: No abnormal bursal fluid collection. Muscles and tendons Muscles and tendons: Mild tendinosis of the right gluteus medius tendon with associated peritendinous edema. The hamstring, iliopsoas, rectus femoris, and adductor tendons appear intact without tear or significant tendinosis. Normal muscle bulk and signal intensity without edema, atrophy, or fatty infiltration. Other findings Miscellaneous: Linear scarring with scattered susceptibility at the lateral aspect of the right hip compatible with prior postsurgical change. No soft tissue fluid collection. Multiple nabothian cysts at the level of the cervix. Degenerative facet arthropathy of the visualized lower lumbar spine, better characterized on dedicated same-day lumbar spine MRI. IMPRESSION: 1. Status post right total hip arthroplasty. No evidence of hardware complication. No periprosthetic fluid collection. 2. Mild tendinosis of the right gluteus medius tendon with associated peritendinous edema. Electronically Signed   By: Davina Poke D.O.   On: 03/18/2020 11:52   I, Holly Mcmillan, personally (independently) visualized and performed the interpretation of the images attached in this note. Lumbar spine: Significant L2-3 impingement at L3 nerve root.  Moderate nerve compression at L4-5. Hip: Gluteus medius tendinitis    Assessment and Plan: 49 y.o. female with multifactorial  leg pain. Anterior groin and thigh pain due to L3 radiculopathy based on recent MRI results.  Plan for epidural steroid injection and trial of Lyrica at bedtime.  Pause physical therapy until least after injection.  Check back as needed.  Patient will keep me updated.  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  Should do well with physical therapy but current radicular pain interfering with her ability participate fully in PT.  Pause PT until radicular pain a bit better controlled.  Additionally patient was found to have an incidental likely renal cyst on MRI lumbar spine.  She does have some pain in this region.  Plan for ultrasound to further evaluate it.  Renal ultrasound ordered   PDMP not reviewed this encounter. Orders Placed This Encounter  Procedures  . DG INJECT DIAG/THERA/INC NEEDLE/CATH/PLC EPI/LUMB/SAC W/IMG    Standing Status:   Future    Standing Expiration Date:   03/19/2021    Order Specific Question:   Reason for Exam (SYMPTOM  OR DIAGNOSIS REQUIRED)    Answer:   Rt L3 radic. Level and technique per radiology    Order Specific Question:   Is the patient pregnant?    Answer:   No    Order Specific Question:   Preferred Imaging Location?    Answer:   [facility]    Order Specific Question:   Radiology Contrast Protocol - do NOT remove file path    Answer:   [email or web address]  . US RENAL    Standing Status:   Future    Standing Expiration Date:   03/19/2021    Order Specific Question:   Reason for exam:  Answer:   eval cyst kidney seen on MRI    Order Specific Question:   Preferred imaging location?    Answer:   [facility] [address]   Meds ordered this encounter  Medications  . pregabalin (LYRICA) 75 MG capsule    Sig: Take 1-2 capsules (75-150 mg total) by mouth at bedtime as needed (nerve pain).    Dispense:  60 capsule    Refill:  3     Discussed warning signs or symptoms. Please see  discharge instructions. Patient expresses understanding.   The above documentation has been reviewed and is accurate and complete Holly Mcmillan, M.D.

## 2020-03-19 ENCOUNTER — Ambulatory Visit (INDEPENDENT_AMBULATORY_CARE_PROVIDER_SITE_OTHER): Payer: 59 | Admitting: Physical Therapy

## 2020-03-19 ENCOUNTER — Other Ambulatory Visit: Payer: Self-pay

## 2020-03-19 ENCOUNTER — Ambulatory Visit (INDEPENDENT_AMBULATORY_CARE_PROVIDER_SITE_OTHER): Payer: 59 | Admitting: Family Medicine

## 2020-03-19 VITALS — BP 128/84 | HR 71 | Ht 62.0 in | Wt 190.8 lb

## 2020-03-19 DIAGNOSIS — M25551 Pain in right hip: Secondary | ICD-10-CM | POA: Diagnosis not present

## 2020-03-19 DIAGNOSIS — N281 Cyst of kidney, acquired: Secondary | ICD-10-CM

## 2020-03-19 DIAGNOSIS — M25651 Stiffness of right hip, not elsewhere classified: Secondary | ICD-10-CM | POA: Diagnosis not present

## 2020-03-19 DIAGNOSIS — R252 Cramp and spasm: Secondary | ICD-10-CM

## 2020-03-19 DIAGNOSIS — G8929 Other chronic pain: Secondary | ICD-10-CM

## 2020-03-19 DIAGNOSIS — M5416 Radiculopathy, lumbar region: Secondary | ICD-10-CM | POA: Diagnosis not present

## 2020-03-19 DIAGNOSIS — R293 Abnormal posture: Secondary | ICD-10-CM | POA: Diagnosis not present

## 2020-03-19 DIAGNOSIS — M419 Scoliosis, unspecified: Secondary | ICD-10-CM | POA: Diagnosis not present

## 2020-03-19 DIAGNOSIS — R262 Difficulty in walking, not elsewhere classified: Secondary | ICD-10-CM

## 2020-03-19 DIAGNOSIS — M25511 Pain in right shoulder: Secondary | ICD-10-CM

## 2020-03-19 MED ORDER — PREGABALIN 75 MG PO CAPS
75.0000 mg | ORAL_CAPSULE | Freq: Every evening | ORAL | 3 refills | Status: DC | PRN
Start: 2020-03-19 — End: 2020-06-07

## 2020-03-19 NOTE — Patient Instructions (Addendum)
Thank you for coming in today.  Pause PT until after the injection.   Please call [facility] at [phone] to schedule your spine injection.   Also schedule the renal ultrasound as well.   STOP aspirin now.   Try the lyrica at bedtime 1-2 pills.   Water aerobics may help.   Let me know how things go after the injection.    Epidural Steroid Injection  An epidural steroid injection is a shot of steroid medicine and numbing medicine that is given into the space between the spinal cord and the bones of the back (epidural space). The shot helps relieve pain caused by an irritated or swollen nerve root. The amount of pain relief you get from the injection depends on what is causing the nerve to be swollen and irritated, and how long your pain lasts. You are more likely to benefit from this injection if your pain is strong and comes on suddenly rather than if you have had long-term (chronic) pain. Tell a health care provider about:  Any allergies you have.  All medicines you are taking, including vitamins, herbs, eye drops, creams, and over-the-counter medicines.  Any problems you or family members have had with anesthetic medicines.  Any blood disorders you have.  Any surgeries you have had.  Any medical conditions you have.  Whether you are pregnant or may be pregnant. What are the risks? Generally, this is a safe procedure. However, problems may occur, including:  Headache.  Bleeding.  Infection.  Allergic reaction to medicines.  Nerve damage. What happens before the procedure? Staying hydrated Follow instructions from your health care provider about hydration, which may include:  Up to 2 hours before the procedure - you may continue to drink clear liquids, such as water, clear fruit juice, black coffee, and plain tea. Eating and drinking restrictions Follow instructions from your health care provider about eating and drinking, which may include:  8 hours  before the procedure - stop eating heavy meals or foods, such as meat, fried foods, or fatty foods.  6 hours before the procedure - stop eating light meals or foods, such as toast or cereal.  6 hours before the procedure - stop drinking milk or drinks that contain milk.  2 hours before the procedure - stop drinking clear liquids. Medicines  You may be given medicines to lower anxiety.  Ask your health care provider about: ? Changing or stopping your regular medicines. This is especially important if you are taking diabetes medicines or blood thinners. ? Taking medicines such as aspirin and ibuprofen. These medicines can thin your blood. Do not take these medicines unless your health care provider tells you to take them. ? Taking over-the-counter medicines, vitamins, herbs, and supplements.  Ask your health care provider what steps will be taken to prevent infection. General instructions  Plan to have someone take you home from the hospital or clinic.  If you will be going home right after the procedure, plan to have someone with you for 24 hours. What happens during the procedure?  An IV will be inserted into one of your veins.  You will be given one or more of the following: ? A medicine to help you relax (sedative). ? A medicine to numb the area (local anesthetic).  You will be asked to lie on your abdomen or sit.  The injection site will be cleaned.  A needle will be inserted through your skin into the epidural space. This may cause you some  discomfort. An X-ray machine will be used to guide the needle as close as possible to the affected nerve.  A steroid medicine and a local anesthetic will be injected into the epidural space.  The needle and IV will be removed.  A bandage (dressing) will be put over the injection site. The procedure may vary among health care providers and hospitals. What can I expect after the procedure? Follow these instructions at home: Injection  site care  You may remove the bandage (dressing) after 24 hours.  Check your injection site every day for signs of infection. Check for: ? Redness, swelling, or pain. ? Fluid or blood. ? Warmth. ? Pus or a bad smell. Managing pain, stiffness, and swelling  For 24 hours after the procedure: ? Avoid using heat on the injection site. ? Do not take baths, swim, or use a hot tub until your health care provider approves. Ask your health care provider if you may take a shower. You may only be allowed to take sponge baths.  If directed, put ice on the injection site. To do this: ? Put ice in a plastic bag. ? Place a towel between your skin and the bag. ? Leave the ice on for 20 minutes, 2-3 times a day.  Activity  Do not drive for 24 hours if you were given a sedative during your procedure.  Return to your normal activities as told by your health care provider. Ask your health care provider what activities are safe for you. General instructions  Your blood pressure, heart rate, breathing rate, and blood oxygen level will be monitored until you leave the hospital or clinic.  Your arm or leg may feel weak or numb for a few hours.  The injection site may feel sore.  Take over-the-counter and prescription medicines only as told by your health care provider.  Drink enough fluid to keep your urine pale yellow.  Keep all follow-up visits as told by your health care provider. This is important. Contact a health care provider if:  You have any of these signs of infection: ? Redness, swelling, or pain around your injection site. ? Fluid or blood coming from your injection site. ? Warmth coming from your injection site. ? Pus or a bad smell coming from your injection site. ? A fever.  You continue to have pain and soreness around the injection site, even after taking over-the-counter pain medicine.  You have severe, sudden, or lasting nausea or vomiting. Get help right away if:  You  have severe pain at the injection site that is not relieved by medicines.  You develop a severe headache or a stiff neck.  You become sensitive to light.  You have any new numbness or weakness in your legs or arms.  You lose control of your bladder or bowel movements.  You have trouble breathing. Summary  An epidural steroid injection is a shot of steroid medicine and numbing medicine that is given into the epidural space.  The shot helps relieve pain caused by an irritated or swollen nerve root.  You are more likely to benefit from this injection if your pain is strong and comes on suddenly rather than if you have had chronic pain. This information is not intended to replace advice given to you by your health care provider. Make sure you discuss any questions you have with your health care provider. Document Revised: 10/31/2018 Document Reviewed: 10/31/2018 Elsevier Patient Education  [place].

## 2020-03-19 NOTE — Therapy (Addendum)
[facility] [address] [place], [place], [address] Phone: [phone]   Fax:  [phone]  Physical Therapy Treatment Progress Note/DC  Patient Details  Name: Holly Mcmillan MRN: [id number] Date of Birth: [birth year] Referring Provider (PT): Gregor Hams MD  PHYSICAL THERAPY DISCHARGE SUMMARY  Visits from [facility]: 6  Current functional level related to goals / functional outcomes: See note   Remaining deficits: See note   Education / Equipment: See note Plan:                                                    Patient goals were partially met. Patient is being discharged due to not returning since the last visit.  ?????      Encounter Date: 03/19/2020   PT End of Session - 03/19/20 0811    Visit Number 6    Number of Visits 16    PT Start Time 0802    PT Stop Time 0841    PT Time Calculation (min) 39 min    Activity Tolerance Patient tolerated treatment well    Behavior During Therapy [facility] for tasks assessed/performed           Past Medical History:  Diagnosis Date  . Depression   . Dysmenorrhea   . Gallbladder disease   . Headache(784.0)   . Hypertension   . Hypothyroidism   . Obesity   . Ovarian cyst     Past Surgical History:  Procedure Laterality Date  . ablation    . BREAST CYST EXCISION Right 06/24/2016   Procedure: EXCISION RIGHT BREAST CYST;  Surgeon: Coralie Keens, MD;  Location: [place];  Service: General;  Laterality: Right;  . CESAREAN SECTION  2002, 2009  . CHOLECYSTECTOMY  2005  . NASAL SINUS SURGERY    . THYROID SURGERY  2012    There were no vitals filed for this visit.   Subjective Assessment - 03/19/20 0852    Subjective Pt arriving today reporting pain with prolonged walking and stepping. Pt reporting she has a visit with Dr. [name] at 9:30 this morning follwing therapy.    Pertinent History HTN, obesity, L RTC, R lumbar radiculopathy, previous R THA    Limitations  Standing;Walking    How long can you sit comfortably? Not a problem    How long can you stand comfortably? Not long    How long can you walk comfortably? Short distances within the house    Diagnostic tests Bone scan tomorrow.  X-ray normal for post-THA.    Patient Stated Goals Be able to stand and walk without pain.    Currently in Pain? Yes    Pain Score 7     Pain Location Hip    Pain Orientation Right    Pain Descriptors / Indicators Aching    Pain Type Chronic pain              [facility] PT Assessment - 03/19/20 0001      AROM   AROM Assessment Site Hip    Right Hip Flexion 110    Right Hip External Rotation  40    Right Hip Internal Rotation  20    Left Hip Flexion 110    Left Hip External Rotation  45    Left Hip Internal Rotation  18  Strength   Right Hip ABduction 4/5    Right Hip ADduction 4/5    Left Hip Flexion 4+/5      Flexibility   Hamstrings R; 50 degrees/ L: 80 degrees                         [facility] Adult PT Treatment/Exercise - 03/19/20 0001      Exercises   Exercises Lumbar;Knee/Hip      Lumbar Exercises: Prone   Other Prone Lumbar Exercises Prone alternating hip extensions 10X 3 seconds      Lumbar Exercises: Quadruped   Madcat/Old Horse 10 reps    Opposite Arm/Leg Raise Right arm/Left leg;Left arm/Right leg;10 reps    Opposite Arm/Leg Raise Limitations 3-5 secon holds      Knee/Hip Exercises: Stretches   Hip Flexor Stretch Both;5 reps;20 seconds   Single knee to chest with other leg straight   Piriformis Stretch Both;5 reps;20 seconds   1 knee bent with foot on mat, cross ankle over bent knee     Knee/Hip Exercises: Aerobic   Recumbent Bike L3 x 7 minutes      Knee/Hip Exercises: Standing   Hip Abduction Stengthening;Right;2 sets;10 reps      Manual Therapy   Manual therapy comments Percussion using hypervolt to R hip, IT band, pirifrmos and superior hamstring tendson    Soft tissue mobilization active trigger point  release in R piriformis                       PT Long Term Goals - 03/19/20 0838      PT LONG TERM GOAL #1   Title Improve FOTO score to 70.    Status On-going      PT LONG TERM GOAL #2   Title Improve hip AROM for flexion to 100; IR to 5; ER to 30 and hamstrings to 40 degrees.    Baseline see flowsheets    Status Achieved      PT LONG TERM GOAL #3   Title Improve R hip strength to 4+/5 MMT or better.    Baseline see flowsheets    Status Achieved      PT LONG TERM GOAL #4   Title Senetra will be able to walk distances of a mile or more without being limited by R hip pain.    Baseline Pt still reporting pain with walking longer distances, pain can increase to 8/10.    Status On-going      PT LONG TERM GOAL #5   Title Irelyn will be independent and compliant with her HEP at DC.    Status On-going                 Plan - 03/19/20 2542    Clinical Impression Statement Pt still reporting pain with prolonged walking. Pt has made progress with increased functional mobility, strength and ROM.Pt has improved her R hip flexion by 30 degrees in the last 4 weeks.  Pt responding well to extension exercises and quadraped exercises. Continued skilled PT to progress towrad LTG's that are not met and decrease in pt's pain with gait.    Personal Factors and Comorbidities Comorbidity 1    Comorbidities HTN, obesity, previous THA, previous R sciatica, L RTC    Examination-Activity Limitations Transfers;Squat;Lift;Locomotion Level;Stairs;Stand    Examination-Participation Restrictions Occupation;Community Activity    Stability/Clinical Decision Making Stable/Uncomplicated    Rehab Potential Good    PT Frequency  2x / week    PT Duration 6 weeks    PT Treatment/Interventions ADLs/Self Care Home Management;Cryotherapy;Therapeutic activities;Functional mobility training;Stair training;Gait training;Therapeutic exercise;Neuromuscular re-education;Patient/family education    PT Next  Visit Plan Increase AROM, flexibility and strength activities as appropriate.    PT Home Exercise Plan Access Code: [email or web address]    Consulted and Agree with Plan of Care Patient           Patient will benefit from skilled therapeutic intervention in order to improve the following deficits and impairments:  Abnormal gait, Decreased activity tolerance, Decreased endurance, Decreased range of motion, Decreased mobility, Decreased strength, Difficulty walking, Hypomobility, Increased edema, Impaired flexibility, Pain, Obesity  Visit Diagnosis: Difficulty walking  Stiffness of right hip, not elsewhere classified  Pain in right hip  Abnormal posture  Radiculopathy, lumbar region  Chronic right shoulder pain  Cramp and spasm     Problem List Patient Active Problem List   Diagnosis Date Noted  . History of total hip replacement 02/01/2020  . Need for hepatitis C screening test 01/29/2020  . Chronic right shoulder pain 09/04/2019  . Primary osteoarthritis involving multiple joints 08/02/2019  . Rotator cuff disorder, left 04/17/2019  . Irritable bowel syndrome with diarrhea 01/10/2019  . Lumbar radiculopathy, right 07/06/2017  . Eczema of face 05/26/2017  . Routine general medical examination at a health care facility 03/16/2016  . RLS (restless legs syndrome) 03/16/2016  . Hypertriglyceridemia, essential 04/04/2015  . Hyperlipidemia with target LDL less than 100 04/04/2015  . Severe obesity (BMI >= 40) ([place]) 09/20/2013  . Hyperglycemia 09/30/2011  . Hypothyroidism 07/09/2010  . Allergic rhinitis due to pollen 06/30/2010  . TOBACCO USE 11/28/2009  . Depression, major, in remission ([place]) 11/28/2009  . Essential hypertension 11/28/2009    Oretha Caprice, PT, MPT 03/19/2020, 9:12 AM  Farley Ly PT, MPT  [facility] [address] [place], [place], [address] Phone: [phone]   Fax:  [phone]  Name: Holly Mcmillan MRN:  [id number] Date of Birth: [birth year]

## 2020-03-21 ENCOUNTER — Other Ambulatory Visit: Payer: Self-pay | Admitting: Family Medicine

## 2020-03-21 ENCOUNTER — Ambulatory Visit
Admission: RE | Admit: 2020-03-21 | Discharge: 2020-03-21 | Disposition: A | Discharge status: Home / Self Care | Payer: 59 | Source: Ambulatory Visit | Attending: Family Medicine | Admitting: Family Medicine

## 2020-03-21 ENCOUNTER — Other Ambulatory Visit: Payer: Self-pay

## 2020-03-21 DIAGNOSIS — M25551 Pain in right hip: Secondary | ICD-10-CM

## 2020-03-21 DIAGNOSIS — M5416 Radiculopathy, lumbar region: Secondary | ICD-10-CM

## 2020-03-21 DIAGNOSIS — M419 Scoliosis, unspecified: Secondary | ICD-10-CM

## 2020-03-21 MED ORDER — METHYLPREDNISOLONE ACETATE 40 MG/ML INJ SUSP (RADIOLOG
120.0000 mg | Freq: Once | INTRAMUSCULAR | Status: AC
  Administered 2020-03-21: 120 mg via EPIDURAL

## 2020-03-21 MED ORDER — IOPAMIDOL (ISOVUE-M 200) INJECTION 41%
1.0000 mL | Freq: Once | INTRAMUSCULAR | Status: AC
  Administered 2020-03-21: 1 mL via EPIDURAL

## 2020-03-21 NOTE — Discharge Instructions (Signed)

## 2020-04-01 ENCOUNTER — Encounter: Payer: Self-pay | Admitting: Family Medicine

## 2020-04-03 ENCOUNTER — Other Ambulatory Visit: Payer: 59

## 2020-04-03 NOTE — Progress Notes (Signed)
I, Wendy Poet, LAT, ATC, am serving as scribe for Dr. [name].  Holly Mcmillan is a 49 y.o. female who presents to [place] at [facility] today for f/u of chronic R hip pain.  She was last seen by Dr. [name] on 03/19/20 to review her R hip and L-spine MRIs and then had a R L3 transforaminal ESI and nerve root block on 03/21/20.  She was also prescribed Lyrica.  Since then, pt reports that the [facility] did not help much at all in terms of her R LE symptoms.  She is feeling most pain in her R post-lat hip and R thigh.  She states that when she wakes up and begins walking, she feels bone pain almost like she has a steel rod in her R leg/thigh.  Diagnostic testing: R hip and L-spine MRI- 03/17/20; L-spine XR- 03/14/20  Pertinent review of systems: No fevers or chills  Relevant historical information: History right total hip replacement   Exam:  BP 114/84 (BP Location: Left Arm, Patient Position: Sitting, Cuff Size: Normal)   Ht 5\' 2"  (1.575 m)   Wt 192 lb 3.2 oz (87.2 kg)   BMI 35.15 kg/m  General: Well Developed, well nourished, and in no acute distress.   MSK: Right hip normal-appearing normal motion.  Tender palpation ischial tuberosity.  Not particular tender greater trochanter at the insertion site of gluteus medius tendon. Normal hip motion.    Lab and Radiology Results  Procedure: Real-time Ultrasound Guided Injection of right ischial tuberosity Device: Philips Affiniti 50G Images permanently stored and available for review in PACS Ultrasound examination of ischial tuberosity region prior to injection reveals a small ischial tuberosity bursa.  [place] located fairly distant from sciatic nerve. Verbal informed consent obtained.  Discussed risks and benefits of procedure. Warned about infection, temporarily blocking the sciatic nerve with Marcaine, bleeding damage to structures skin hypopigmentation and fat atrophy among others. Patient expresses understanding  and agreement Time-out conducted.   Noted no overlying erythema, induration, or other signs of local infection.   Skin prepped in a sterile fashion.   Local anesthesia: Topical Ethyl chloride.   With sterile technique and under real time ultrasound guidance:  40 mg of Kenalog and 2 mL of Marcaine injected into ischial tuberosity bursa. Fluid seen entering the bursa.   Completed without difficulty   Pain partially resolved suggesting accurate placement of the medication.   Advised to call if fevers/chills, erythema, induration, drainage, or persistent bleeding.   Images permanently stored and available for review in the ultrasound unit.  Impression: Technically successful ultrasound guided injection.      Assessment and Plan: 49 y.o. female with multifactorial hip and leg pain.  Some of the pain due to insertional tendinopathy pathology and bursitis including gluteus medius tendinitis and now it seems to be ischial tuberosity bursitis.  Additionally some of the pain due to lumbar radiculopathy.  Plan for injection today.  If this does not provide significant benefit would repeat epidural steroid injection.  Patient will keep me updated.  Check back as needed.   PDMP not reviewed this encounter. Orders Placed This Encounter  Procedures  . [place] LIMITED JOINT SPACE STRUCTURES LOW RIGHT(NO LINKED CHARGES)    Order Specific Question:   Reason for Exam (SYMPTOM  OR DIAGNOSIS REQUIRED)    Answer:   R hip pain    Order Specific Question:   Preferred imaging location?    Answer:   [place]  No orders of the defined types were placed in this encounter.    Discussed warning signs or symptoms. Please see discharge instructions. Patient expresses understanding.   The above documentation has been reviewed and is accurate and complete [name], M.D.

## 2020-04-04 ENCOUNTER — Ambulatory Visit: Payer: Self-pay

## 2020-04-04 ENCOUNTER — Ambulatory Visit (INDEPENDENT_AMBULATORY_CARE_PROVIDER_SITE_OTHER): Payer: 59 | Admitting: Family Medicine

## 2020-04-04 ENCOUNTER — Other Ambulatory Visit: Payer: Self-pay

## 2020-04-04 VITALS — BP 114/84 | Ht 62.0 in | Wt 192.2 lb

## 2020-04-04 DIAGNOSIS — M25551 Pain in right hip: Secondary | ICD-10-CM | POA: Diagnosis not present

## 2020-04-04 DIAGNOSIS — M5416 Radiculopathy, lumbar region: Secondary | ICD-10-CM

## 2020-04-04 NOTE — Patient Instructions (Signed)
Thank you for coming in today.  Let me know how you feel after this shot.  Next step is repeat back injection.  Let me know when you want me to order it.   Call or go to the ER if you develop a large red swollen joint with extreme pain or oozing puss.

## 2020-04-11 ENCOUNTER — Ambulatory Visit
Admission: RE | Admit: 2020-04-11 | Discharge: 2020-04-11 | Disposition: A | Discharge status: Home / Self Care | Payer: 59 | Source: Ambulatory Visit | Attending: Family Medicine | Admitting: Family Medicine

## 2020-04-11 ENCOUNTER — Other Ambulatory Visit: Payer: Self-pay

## 2020-04-11 DIAGNOSIS — N281 Cyst of kidney, acquired: Secondary | ICD-10-CM

## 2020-05-01 NOTE — Progress Notes (Signed)
I, Christoper Fabian, LAT, ATC, am serving as scribe for Dr. Clementeen Graham.  Holly Mcmillan is a 49 y.o. female who presents to [facility] today for f/u of R hip pain.  She was last seen by Dr. Denyse Amass on 04/04/20 and noted con't R post-lat hip and R thigh pain w/ no relief noted from a R L3 transforaminal ESI and nerve root block performed on 03/21/20.  She had a R IT injection at her last visit.  Today, pt reports R hip increased pain w/ sitting/laying certain ways. Pt notes the pain is not too much better. Pt locates pain to later hip and buttock and pain radiates down leg w/ burning pain into shin.  Diagnostic testing: R hip and L-spine MRI- 03/17/20; L-spine XR- 03/14/20; R hip XR- 02/01/20   Pertinent review of systems: No fevers or chills  Relevant historical information: Hypertension, total hip replacement   Exam:  BP 138/86 (BP Location: Right Arm, Patient Position: Sitting, Cuff Size: Normal)   Pulse 70   Ht 5\' 2"  (1.575 m)   Wt 194 lb 6.4 oz (88.2 kg)   SpO2 100%   BMI 35.56 kg/m  General: Well Developed, well nourished, and in no acute distress.   MSK: Right hip tender palpation greater trochanter.  Pain with hip abduction.    Lab and Radiology Results EXAM: MR OF THE RIGHT HIP WITHOUT CONTRAST  TECHNIQUE: Multiplanar, multisequence MR imaging was performed. No intravenous contrast was administered.  COMPARISON:  X-ray 02/01/2020  FINDINGS: Bones: Status post right total hip arthroplasty. Arthroplasty components are in their expected alignment without dislocation. No periprosthetic fracture or marrow edema. Bone marrow signal within the visualized pelvis and proximal left femur are within normal limits. No suspicious marrow replacing lesion.  Joint or bursal effusion  Joint effusion: No joint effusion or periprosthetic fluid collection.  Bursae: No abnormal bursal fluid collection.  Muscles and tendons  Muscles and  tendons: Mild tendinosis of the right gluteus medius tendon with associated peritendinous edema. The hamstring, iliopsoas, rectus femoris, and adductor tendons appear intact without tear or significant tendinosis. Normal muscle bulk and signal intensity without edema, atrophy, or fatty infiltration.  Other findings  Miscellaneous: Linear scarring with scattered susceptibility at the lateral aspect of the right hip compatible with prior postsurgical change. No soft tissue fluid collection. Multiple nabothian cysts at the level of the cervix. Degenerative facet arthropathy of the visualized lower lumbar spine, better characterized on dedicated same-day lumbar spine MRI.  IMPRESSION: 1. Status post right total hip arthroplasty. No evidence of hardware complication. No periprosthetic fluid collection. 2. Mild tendinosis of the right gluteus medius tendon with associated peritendinous edema.   Electronically Signed   By: 02/03/2020 D.O.   On: 03/18/2020 11:52 I, 03/20/2020, personally (independently) visualized and performed the interpretation of the images attached in this note.  Procedure: Real-time Ultrasound Guided Injection of right lateral hip greater trochanter bursa near insertion of gluteus medius tendon Device: Philips Affiniti 50G Images permanently stored and available for review in PACS Verbal informed consent obtained.  Discussed risks and benefits of procedure. Warned about infection bleeding damage to structures skin hypopigmentation and fat atrophy among others. Patient expresses understanding and agreement Time-out conducted.   Noted no overlying erythema, induration, or other signs of local infection.   Skin prepped in a sterile fashion.   Local anesthesia: Topical Ethyl chloride.   With sterile technique and under real time ultrasound guidance:  40  mg of Kenalog and 2 L of Marcaine injected into greater trochanter bursa. Fluid seen entering the bursa.    Completed without difficulty   Pain immediately resolved suggesting accurate placement of the medication.   Advised to call if fevers/chills, erythema, induration, drainage, or persistent bleeding.   Images permanently stored and available for review in the ultrasound unit.  Impression: Technically successful ultrasound guided injection.         Assessment and Plan: 48 y.o. female with multifactorial hip and leg pain.  Pain due to hip tendinopathy and bursitis.  Additionally she may have a component of lumbar radiculopathy based on prior lumbar MRI.  Unfortunately she has not had great resolution of pain with multiple different interventions including physical therapy, and several different injections including epidural steroid injection at L3, prior greater trochanter injection, and ischial tuberosity bursa injection.  After discussion of options today we will proceed with trial of greater trochanter injection.  She did have pretty good response immediately following the injection indicating that this is probably the main pain generator for her.  Hopeful that she will have some better response to this treatment.  If still not much better would consider referral to pain management for second opinion and evaluation of other potential interventions that may help.  May also consider a orthopedic surgery referral as well.   PDMP not reviewed this encounter. Orders Placed This Encounter  Procedures  . [place] LIMITED JOINT SPACE STRUCTURES LOW RIGHT(NO LINKED CHARGES)    Standing Status:   Future    Number of Occurrences:   1    Standing Expiration Date:   10/31/2020    Order Specific Question:   Reason for Exam (SYMPTOM  OR DIAGNOSIS REQUIRED)    Answer:   chronic right hip pain    Order Specific Question:   Preferred imaging location?    Answer:   [place]   No orders of the defined types were placed in this encounter.    Discussed warning signs or symptoms.  Please see discharge instructions. Patient expresses understanding.   The above documentation has been reviewed and is accurate and complete [name], M.D.

## 2020-05-02 ENCOUNTER — Other Ambulatory Visit: Payer: Self-pay

## 2020-05-02 ENCOUNTER — Ambulatory Visit (INDEPENDENT_AMBULATORY_CARE_PROVIDER_SITE_OTHER): Payer: 59 | Admitting: Family Medicine

## 2020-05-02 ENCOUNTER — Ambulatory Visit: Payer: Self-pay

## 2020-05-02 VITALS — BP 138/86 | HR 70 | Ht 62.0 in | Wt 194.4 lb

## 2020-05-02 DIAGNOSIS — Z96641 Presence of right artificial hip joint: Secondary | ICD-10-CM | POA: Diagnosis not present

## 2020-05-02 DIAGNOSIS — M25551 Pain in right hip: Secondary | ICD-10-CM

## 2020-05-02 NOTE — Patient Instructions (Addendum)
Thank you for coming in today.  Call or go to the ER if you develop a large red swollen joint with extreme pain or oozing puss.   If this does not help I would recommend a 2nd opponens  With pain management to make sure there is not a treatment or injection that I am overlooking.   Let me know.

## 2020-05-06 LAB — HM PAP SMEAR

## 2020-05-10 ENCOUNTER — Encounter: Payer: Self-pay | Admitting: Internal Medicine

## 2020-05-10 ENCOUNTER — Other Ambulatory Visit: Payer: Self-pay | Admitting: Internal Medicine

## 2020-05-10 DIAGNOSIS — E039 Hypothyroidism, unspecified: Secondary | ICD-10-CM

## 2020-05-10 MED ORDER — LEVOTHYROXINE SODIUM 50 MCG PO TABS: 50.0000 ug | ORAL_TABLET | Freq: Every day | ORAL | 0 refills | Status: DC

## 2020-05-10 MED ORDER — SYNTHROID 100 MCG PO TABS: 100.0000 ug | ORAL_TABLET | Freq: Every day | ORAL | 0 refills | Status: DC

## 2020-05-10 MED ORDER — SYNTHROID 200 MCG PO TABS: 200.0000 ug | ORAL_TABLET | Freq: Every day | ORAL | 0 refills | Status: DC

## 2020-05-13 ENCOUNTER — Other Ambulatory Visit: Payer: Self-pay | Admitting: Internal Medicine

## 2020-05-13 ENCOUNTER — Encounter: Payer: Self-pay | Admitting: Internal Medicine

## 2020-05-13 DIAGNOSIS — F325 Major depressive disorder, single episode, in full remission: Secondary | ICD-10-CM

## 2020-05-13 DIAGNOSIS — M159 Polyosteoarthritis, unspecified: Secondary | ICD-10-CM

## 2020-05-13 DIAGNOSIS — M8949 Other hypertrophic osteoarthropathy, multiple sites: Secondary | ICD-10-CM

## 2020-05-13 MED ORDER — DULOXETINE HCL 30 MG PO CPEP: 30.0000 mg | ORAL_CAPSULE | Freq: Every day | ORAL | 0 refills | Status: DC

## 2020-05-16 ENCOUNTER — Telehealth (INDEPENDENT_AMBULATORY_CARE_PROVIDER_SITE_OTHER): Payer: 59 | Admitting: Internal Medicine

## 2020-05-16 ENCOUNTER — Encounter: Payer: Self-pay | Admitting: Internal Medicine

## 2020-05-16 ENCOUNTER — Encounter: Payer: Self-pay | Admitting: Family Medicine

## 2020-05-16 DIAGNOSIS — M419 Scoliosis, unspecified: Secondary | ICD-10-CM

## 2020-05-16 DIAGNOSIS — M5416 Radiculopathy, lumbar region: Secondary | ICD-10-CM

## 2020-05-16 DIAGNOSIS — Z96641 Presence of right artificial hip joint: Secondary | ICD-10-CM

## 2020-05-16 DIAGNOSIS — J301 Allergic rhinitis due to pollen: Secondary | ICD-10-CM

## 2020-05-16 DIAGNOSIS — M25551 Pain in right hip: Secondary | ICD-10-CM

## 2020-05-16 DIAGNOSIS — Z20822 Contact with and (suspected) exposure to covid-19: Secondary | ICD-10-CM | POA: Diagnosis not present

## 2020-05-16 DIAGNOSIS — J0100 Acute maxillary sinusitis, unspecified: Secondary | ICD-10-CM | POA: Diagnosis not present

## 2020-05-16 MED ORDER — DOXYCYCLINE HYCLATE 100 MG PO TABS: 100.0000 mg | ORAL_TABLET | Freq: Two times a day (BID) | ORAL | 0 refills | Status: DC

## 2020-05-16 NOTE — Assessment & Plan Note (Signed)
Stable, cont flonase ?

## 2020-05-16 NOTE — Assessment & Plan Note (Signed)
Mild to mod, for antibx course,  to f/u any worsening symptoms or concerns 

## 2020-05-16 NOTE — Progress Notes (Signed)
Patient ID: Holly Mcmillan, female   DOB: 03/16/1971, 50 y.o.   MRN: [id number]  Virtual Visit via Video Note  I connected with Holly Mcmillan on 05/16/20 at  3:00 PM EST by a video enabled telemedicine application and verified that I am speaking with the correct person using two identifiers.  Location of all participants today Patient: at home Provider: at office   I discussed the limitations of evaluation and management by telemedicine and the availability of in person appointments. The patient expressed understanding and agreed to proceed.  History of Present Illness:  Here with 2-3 wks acute onset fever, facial pain, pressure, headache, general weakness and malaise, and greenish d/c, with persistent severe ST and non prod cough, but pt denies chest pain, wheezing, increased sob or doe, orthopnea, PND, increased LE swelling, palpitations, dizziness or syncope. Husband currently covid pos, but she was neg dec 30, and repeat covid testing pending.  Pt denies loss of taste or smell, n/v, or diarrhea.  flonase is taken daily for allergy symptoms prior to xmas but not helping for this Past Medical History:  Diagnosis Date  . Depression   . Dysmenorrhea   . Gallbladder disease   . Headache(784.0)   . Hypertension   . Hypothyroidism   . Obesity   . Ovarian cyst    Past Surgical History:  Procedure Laterality Date  . ablation    . BREAST CYST EXCISION Right 06/24/2016   Procedure: EXCISION RIGHT BREAST CYST;  Surgeon: Coralie Keens, MD;  Location: [place];  Service: General;  Laterality: Right;  . CESAREAN SECTION  2002, 2009  . CHOLECYSTECTOMY  2005  . NASAL SINUS SURGERY    . THYROID SURGERY  2012    reports that she has quit smoking. Her smoking use included cigarettes. She has a 6.00 pack-year smoking history. She has never used smokeless tobacco. She reports that she does not drink alcohol and does not use drugs. family history includes Alcohol abuse in her  maternal uncle and paternal uncle; Arthritis in her father; Cancer in an other family member; Cervical cancer in her mother; Colon cancer in her maternal aunt; Heart disease in her maternal grandfather and mother; Hypertension in her father and mother; Leukemia in her mother; Lung cancer in her maternal uncle; Pancreatic cancer in her maternal aunt. Allergies  Allergen Reactions  . Spironolactone     Hair loss  . Sulfonamide Derivatives Hives   Current Outpatient Medications on File Prior to Visit  Medication Sig Dispense Refill  . buPROPion (WELLBUTRIN XL) 300 MG 24 hr tablet TAKE 1 TABLET BY MOUTH EVERY DAY 30 tablet 5  . DULoxetine (CYMBALTA) 30 MG capsule Take 1 capsule (30 mg total) by mouth daily. 30 capsule 0  . estradiol (ESTRACE) 2 MG tablet Take 2-4 mg by mouth daily.    . fluticasone (FLONASE) 50 MCG/ACT nasal spray SPRAY 2 SPRAYS INTO EACH NOSTRIL EVERY DAY 16 mL 5  . levothyroxine (SYNTHROID) 50 MCG tablet Take 1 tablet (50 mcg total) by mouth daily. 90 tablet 0  . nabumetone (RELAFEN) 750 MG tablet Take 1 tablet (750 mg total) by mouth 2 (two) times daily as needed. 180 tablet 1  . omega-3 acid ethyl esters (LOVAZA) 1 g capsule TAKE 2 CAPSULES (2 G TOTAL) BY MOUTH 2 (TWO) TIMES DAILY. 120 capsule 5  . pregabalin (LYRICA) 75 MG capsule Take 1-2 capsules (75-150 mg total) by mouth at bedtime as needed (nerve pain). 60 capsule 3  .  progesterone (PROMETRIUM) 100 MG capsule Take 100 mg by mouth at bedtime.    Marland Kitchen SYNTHROID 100 MCG tablet Take 1 tablet (100 mcg total) by mouth daily. 90 tablet 0  . SYNTHROID 200 MCG tablet Take 1 tablet (200 mcg total) by mouth daily before breakfast. 90 tablet 0  . telmisartan-hydrochlorothiazide (MICARDIS HCT) 40-12.5 MG tablet TAKE 1 TABLET BY MOUTH EVERY DAY 30 tablet 3  . valACYclovir (VALTREX) 1000 MG tablet TAKE 2 TABLETS BY MOUTH 2 TIMES DAILY FOR 2 DOSES. 4 tablet 2   No current facility-administered medications on file prior to visit.     Observations/Objective: Alert, NAD, appropriate mood and affect, resps normal, cn 2-12 intact, moves all 4s, no visible rash or swelling Lab Results  Component Value Date   WBC 9.1 08/02/2019   HGB 13.2 08/02/2019   HCT 39.5 08/02/2019   PLT 348.0 08/02/2019   GLUCOSE 85 08/02/2019   CHOL 241 (H) 02/01/2020   TRIG 134.0 02/01/2020   HDL 52.80 02/01/2020   LDLDIRECT 173.0 10/25/2018   LDLCALC 161 (H) 02/01/2020   ALT 20 01/10/2019   AST 17 01/10/2019   NA 137 08/02/2019   K 3.9 08/02/2019   CL 102 08/02/2019   CREATININE 0.85 08/02/2019   BUN 16 08/02/2019   CO2 28 08/02/2019   TSH 2.44 02/01/2020   HGBA1C 5.5 03/16/2016   Assessment and Plan: See notes  Follow Up Instructions: Seen notes   I discussed the assessment and treatment plan with the patient. The patient was provided an opportunity to ask questions and all were answered. The patient agreed with the plan and demonstrated an understanding of the instructions.   The patient was advised to call back or seek an in-person evaluation if the symptoms worsen or if the condition fails to improve as anticipated   Cathlean Cower, MD

## 2020-05-16 NOTE — Patient Instructions (Signed)
.  Please take all new medication as prescribed  Please continue all other medications as before, and refills have been done if requested.  Please have the pharmacy call with any other refills you may need  Please keep your appointments with your specialists as you may have planned  Please let us know results of your covid testing pending

## 2020-05-16 NOTE — Assessment & Plan Note (Signed)
covid testing results pending, pt to let us know results

## 2020-05-18 ENCOUNTER — Other Ambulatory Visit: Payer: Self-pay | Admitting: Internal Medicine

## 2020-05-18 DIAGNOSIS — E039 Hypothyroidism, unspecified: Secondary | ICD-10-CM

## 2020-05-19 ENCOUNTER — Other Ambulatory Visit: Payer: Self-pay | Admitting: Internal Medicine

## 2020-05-19 DIAGNOSIS — J301 Allergic rhinitis due to pollen: Secondary | ICD-10-CM

## 2020-05-24 ENCOUNTER — Telehealth: Payer: Self-pay | Admitting: Internal Medicine

## 2020-05-24 NOTE — Telephone Encounter (Signed)
    Patient calling for advice, states her body temp has been around 95 for the past week  Seeking advice for low body temp

## 2020-05-28 ENCOUNTER — Encounter: Payer: Self-pay | Admitting: Internal Medicine

## 2020-05-28 ENCOUNTER — Other Ambulatory Visit: Payer: Self-pay | Admitting: Internal Medicine

## 2020-05-28 DIAGNOSIS — E781 Pure hyperglyceridemia: Secondary | ICD-10-CM

## 2020-05-28 NOTE — Telephone Encounter (Signed)
LVM for pt to call back as soon as possible.   PCP would like pt to schedule an appt.

## 2020-05-28 NOTE — Telephone Encounter (Signed)
Ask her to come in for an OV and TSH level

## 2020-06-03 NOTE — Telephone Encounter (Signed)
Pt has an appointment with PCP on 06/07/2020. Closing this note.

## 2020-06-05 ENCOUNTER — Ambulatory Visit: Payer: 59 | Admitting: Internal Medicine

## 2020-06-06 LAB — HM MAMMOGRAPHY: HM Mammogram: NORMAL (ref 0–4)

## 2020-06-07 ENCOUNTER — Ambulatory Visit (INDEPENDENT_AMBULATORY_CARE_PROVIDER_SITE_OTHER): Payer: 59 | Admitting: Internal Medicine

## 2020-06-07 ENCOUNTER — Encounter: Payer: Self-pay | Admitting: Internal Medicine

## 2020-06-07 ENCOUNTER — Other Ambulatory Visit: Payer: Self-pay

## 2020-06-07 VITALS — BP 110/80 | HR 74 | Temp 98.1°F | Resp 16 | Ht 62.0 in | Wt 193.4 lb

## 2020-06-07 DIAGNOSIS — E876 Hypokalemia: Secondary | ICD-10-CM | POA: Diagnosis not present

## 2020-06-07 DIAGNOSIS — Z0000 Encounter for general adult medical examination without abnormal findings: Secondary | ICD-10-CM | POA: Diagnosis not present

## 2020-06-07 DIAGNOSIS — R141 Gas pain: Secondary | ICD-10-CM

## 2020-06-07 DIAGNOSIS — Z0001 Encounter for general adult medical examination with abnormal findings: Secondary | ICD-10-CM | POA: Insufficient documentation

## 2020-06-07 DIAGNOSIS — I10 Essential (primary) hypertension: Secondary | ICD-10-CM

## 2020-06-07 DIAGNOSIS — T502X5A Adverse effect of carbonic-anhydrase inhibitors, benzothiadiazides and other diuretics, initial encounter: Secondary | ICD-10-CM

## 2020-06-07 DIAGNOSIS — E785 Hyperlipidemia, unspecified: Secondary | ICD-10-CM

## 2020-06-07 DIAGNOSIS — Z1231 Encounter for screening mammogram for malignant neoplasm of breast: Secondary | ICD-10-CM | POA: Insufficient documentation

## 2020-06-07 DIAGNOSIS — K635 Polyp of colon: Secondary | ICD-10-CM | POA: Insufficient documentation

## 2020-06-07 DIAGNOSIS — E039 Hypothyroidism, unspecified: Secondary | ICD-10-CM

## 2020-06-07 LAB — BASIC METABOLIC PANEL
BUN: 25 mg/dL — ABNORMAL HIGH (ref 6–23)
CO2: 26 mEq/L (ref 19–32)
Calcium: 9.4 mg/dL (ref 8.4–10.5)
Chloride: 102 mEq/L (ref 96–112)
Creatinine, Ser: 0.79 mg/dL (ref 0.40–1.20)
GFR: 86.89 mL/min (ref >60.00)
Glucose, Bld: 61 mg/dL — ABNORMAL LOW (ref 70–99)
Potassium: 3.3 mEq/L — ABNORMAL LOW (ref 3.5–5.1)
Sodium: 137 mEq/L (ref 135–145)

## 2020-06-07 LAB — LIPID PANEL
Cholesterol: 251 mg/dL — ABNORMAL HIGH (ref 0–200)
HDL: 63.2 mg/dL (ref >39.00)
LDL Cholesterol: 163 mg/dL — ABNORMAL HIGH (ref 0–99)
NonHDL: 187.55
Total CHOL/HDL Ratio: 4
Triglycerides: 123 mg/dL (ref 0.0–149.0)
VLDL: 24.6 mg/dL (ref 0.0–40.0)

## 2020-06-07 LAB — CBC WITH DIFFERENTIAL/PLATELET
Basophils Absolute: 0 10*3/uL (ref 0.0–0.1)
Basophils Relative: 0.5 % (ref 0.0–3.0)
Eosinophils Absolute: 0.1 10*3/uL (ref 0.0–0.7)
Eosinophils Relative: 0.6 % (ref 0.0–5.0)
HCT: 40 % (ref 36.0–46.0)
Hemoglobin: 13.5 g/dL (ref 12.0–15.0)
Lymphocytes Relative: 27.7 % (ref 12.0–46.0)
Lymphs Abs: 2.5 10*3/uL (ref 0.7–4.0)
MCHC: 33.9 g/dL (ref 30.0–36.0)
MCV: 93.9 fl (ref 78.0–100.0)
Monocytes Absolute: 0.9 10*3/uL (ref 0.1–1.0)
Monocytes Relative: 10.5 % (ref 3.0–12.0)
Neutro Abs: 5.5 10*3/uL (ref 1.4–7.7)
Neutrophils Relative %: 60.7 % (ref 43.0–77.0)
Platelets: 368 10*3/uL (ref 150.0–400.0)
RBC: 4.26 Mil/uL (ref 3.87–5.11)
RDW: 12.2 % (ref 11.5–15.5)
WBC: 9 10*3/uL (ref 4.0–10.5)

## 2020-06-07 LAB — TSH: TSH: 0.24 u[IU]/mL — ABNORMAL LOW (ref 0.35–4.50)

## 2020-06-07 LAB — CORTISOL: Cortisol, Plasma: 11.5 ug/dL

## 2020-06-07 MED ORDER — POTASSIUM CHLORIDE CRYS ER 20 MEQ PO TBCR: 20.0000 meq | EXTENDED_RELEASE_TABLET | Freq: Every day | ORAL | 0 refills | Status: DC

## 2020-06-07 NOTE — Progress Notes (Addendum)
Subjective:  Patient ID: Holly Mcmillan, female    DOB: 1970-07-15  Age: 50 y.o. MRN: [id number]  CC: Annual Exam and Hypothyroidism  This visit occurred during the SARS-CoV-2 public health emergency.  Safety protocols were in place, including screening questions prior to the visit, additional usage of staff PPE, and extensive cleaning of exam room while observing appropriate contact time as indicated for disinfecting solutions.    HPI Holly Mcmillan presents for a CPX.  She complains of a 1 month history of intermittent abdominal pain that she describes as gas pain caused by  eating a lot of fruits and vegetables.  She denies nausea, vomiting, diarrhea, constipation, or bright red blood per rectum.  She gets symptom relief with Pepto-Bismol.  She has recently reported that her temperature was 96-97 degrees.    Outpatient Medications Prior to Visit  Medication Sig Dispense Refill  . buPROPion (WELLBUTRIN XL) 300 MG 24 hr tablet TAKE 1 TABLET BY MOUTH EVERY DAY 30 tablet 5  . estradiol (ESTRACE) 2 MG tablet Take 2-4 mg by mouth daily.    . fluticasone (FLONASE) 50 MCG/ACT nasal spray SPRAY 2 SPRAYS INTO EACH NOSTRIL EVERY DAY 16 mL 5  . progesterone (PROMETRIUM) 100 MG capsule Take 100 mg by mouth at bedtime.    Marland Kitchen SYNTHROID 100 MCG tablet TAKE 1 TABLET DAILY FOR HYPOTHYROIDISM 90 tablet 0  . SYNTHROID 200 MCG tablet TAKE 1 TABLET DAILY BEFORE BREAKFAST FOR HYPOTHYROIDISM 90 tablet 0  . valACYclovir (VALTREX) 1000 MG tablet TAKE 2 TABLETS BY MOUTH 2 TIMES DAILY FOR 2 DOSES. (Patient not taking: No sig reported) 4 tablet 2  . doxycycline (VIBRA-TABS) 100 MG tablet Take 1 tablet (100 mg total) by mouth 2 (two) times daily. 20 tablet 0  . DULoxetine (CYMBALTA) 30 MG capsule Take 1 capsule (30 mg total) by mouth daily. 30 capsule 0  . levothyroxine (SYNTHROID) 50 MCG tablet Take 1 tablet (50 mcg total) by mouth daily. 90 tablet 0  . omega-3 acid ethyl esters (LOVAZA) 1 g capsule TAKE 2 CAPSULES (2  G TOTAL) BY MOUTH 2 (TWO) TIMES DAILY. 120 capsule 5  . telmisartan-hydrochlorothiazide (MICARDIS HCT) 40-12.5 MG tablet TAKE 1 TABLET BY MOUTH EVERY DAY 30 tablet 3  . traMADol (ULTRAM) 50 MG tablet Take 50 mg by mouth 3 (three) times daily as needed.    . nabumetone (RELAFEN) 750 MG tablet Take 1 tablet (750 mg total) by mouth 2 (two) times daily as needed. (Patient not taking: Reported on 06/07/2020) 180 tablet 1  . pregabalin (LYRICA) 75 MG capsule Take 1-2 capsules (75-150 mg total) by mouth at bedtime as needed (nerve pain). (Patient not taking: Reported on 06/07/2020) 60 capsule 3  . progesterone (PROMETRIUM) 100 MG capsule Take 100 mg by mouth at bedtime.     No facility-administered medications prior to visit.    ROS Review of Systems  Constitutional: Negative for appetite change, chills, diaphoresis, fatigue and fever.  HENT: Negative.  Negative for sinus pressure, sore throat and trouble swallowing.   Eyes: Negative.   Respiratory: Negative for cough, chest tightness, shortness of breath and wheezing.   Cardiovascular: Negative for chest pain, palpitations and leg swelling.  Gastrointestinal: Positive for abdominal pain. Negative for constipation, diarrhea, nausea and vomiting.  Endocrine: Positive for cold intolerance. Negative for heat intolerance.  Genitourinary: Negative.  Negative for decreased urine volume, difficulty urinating, dysuria, flank pain, hematuria and urgency.  Musculoskeletal: Negative for arthralgias and myalgias.  Skin: Negative.  Negative for color change and pallor.  Neurological: Negative.   Hematological: Negative for adenopathy. Does not bruise/bleed easily.  Psychiatric/Behavioral: Negative.     Objective:  BP 110/80 (BP Location: Right Arm, Patient Position: Sitting, Cuff Size: Large)   Pulse 74   Temp 98.1 F (36.7 C) (Oral)   Resp 16   Ht 5\' 2"  (1.575 m)   Wt 193 lb 6.4 oz (87.7 kg)   SpO2 97%   BMI 35.37 kg/m   BP Readings from Last 3  Encounters:  07/09/20 133/82  06/07/20 110/80  05/02/20 138/86    Wt Readings from Last 3 Encounters:  06/25/20 195 lb (88.5 kg)  06/07/20 193 lb 6.4 oz (87.7 kg)  05/02/20 194 lb 6.4 oz (88.2 kg)    Physical Exam Vitals reviewed.  Constitutional:      Appearance: Normal appearance.  HENT:     Nose: Nose normal.     Mouth/Throat:     Mouth: Mucous membranes are moist.  Eyes:     General: No scleral icterus.    Conjunctiva/sclera: Conjunctivae normal.  Cardiovascular:     Rate and Rhythm: Normal rate and regular rhythm.     Heart sounds: No murmur heard.   Pulmonary:     Effort: Pulmonary effort is normal.     Breath sounds: No stridor. No wheezing, rhonchi or rales.  Abdominal:     General: Abdomen is protuberant. Bowel sounds are normal. There is no distension.     Palpations: Abdomen is soft. There is no hepatomegaly, splenomegaly or mass.     Tenderness: There is no abdominal tenderness.  Musculoskeletal:        General: Normal range of motion.     Cervical back: Neck supple.     Right lower leg: No edema.     Left lower leg: No edema.  Lymphadenopathy:     Cervical: No cervical adenopathy.  Skin:    General: Skin is warm and dry.  Neurological:     General: No focal deficit present.     Mental Status: She is alert.  Psychiatric:        Mood and Affect: Mood normal.     Lab Results  Component Value Date   WBC 9.0 06/07/2020   HGB 13.5 06/07/2020   HCT 40.0 06/07/2020   PLT 368.0 06/07/2020   GLUCOSE 61 (L) 06/07/2020   CHOL 251 (H) 06/07/2020   TRIG 123.0 06/07/2020   HDL 63.20 06/07/2020   LDLDIRECT 173.0 10/25/2018   LDLCALC 163 (H) 06/07/2020   ALT 20 01/10/2019   AST 17 01/10/2019   NA 137 06/07/2020   K 3.3 (L) 06/07/2020   CL 102 06/07/2020   CREATININE 0.79 06/07/2020   BUN 25 (H) 06/07/2020   CO2 26 06/07/2020   TSH 0.24 (L) 06/07/2020   HGBA1C 5.5 03/16/2016    US RENAL  Result Date: 04/12/2020 CLINICAL DATA:  eval cyst kidney  seen on MRI EXAM: RENAL / URINARY TRACT ULTRASOUND COMPLETE COMPARISON:  03/17/2020, 01/16/2019 and prior. FINDINGS: Right Kidney: Renal measurements: 11.8 x 5.3 x 4.4 cm = volume: 145 mL. Echogenicity within normal limits. No mass or hydronephrosis visualized. Left Kidney: Renal measurements: 11.8 x 6.2 x 5.9 cm = volume: 225 mL. Echogenicity within normal limits. No mass or hydronephrosis visualized. Bladder: Appears normal for degree of bladder distention. Other: None. IMPRESSION: Unremarkable renal ultrasound. Prior sub -5 mm left renal cyst seen on MRI is not demonstrated on this exam. Electronically Signed  By: Primitivo Gauze M.D.   On: 04/12/2020 14:43    Assessment & Plan:   Helyn was seen today for annual exam and hypothyroidism.  Diagnoses and all orders for this visit:  Essential hypertension- Her blood pressure is overcontrolled and she has a prerenal azotemia with hypokalemia.  I recommended that she stop taking the ARB and thiazide diuretic. -     CBC with Differential/Platelet; Future -     Basic metabolic panel; Future -     Cortisol; Future -     TSH; Future -     TSH -     Cortisol -     Basic metabolic panel -     CBC with Differential/Platelet -     potassium chloride SA (KLOR-CON) 20 MEQ tablet; Take 1 tablet (20 mEq total) by mouth daily.  Acquired hypothyroidism- Her TSH is slightly suppressed.  I recommended that she decrease the dose of levothyroxine. -     TSH; Future -     TSH  Gas pain- I will screen for celiac disease. -     Gliadin antibodies, serum; Future -     Tissue transglutaminase, IgA; Future -     Reticulin Antibody, IgA w reflex titer; Future -     Reticulin Antibody, IgA w reflex titer -     Tissue transglutaminase, IgA -     Gliadin antibodies, serum  Encounter for general adult medical examination with abnormal findings- Exam completed, labs reviewed, vaccines reviewed - she refused a flu vaccine, cancer screenings addressed, patient  education material was given. -     Lipid panel; Future -     Lipid panel  Polyp of colon, unspecified part of colon, unspecified type -     Ambulatory referral to Gastroenterology  Diuretic-induced hypokalemia -     potassium chloride SA (KLOR-CON) 20 MEQ tablet; Take 1 tablet (20 mEq total) by mouth daily.  Hyperlipidemia with target LDL less than 100-she does not have an elevated ASCVD risk score so I did not recommend a statin for CV risk reduction.   I have discontinued Mareena Cavan. Guyett's nabumetone, telmisartan-hydrochlorothiazide, pregabalin, levothyroxine, doxycycline, and omega-3 acid ethyl esters. I am also having her start on potassium chloride SA. Additionally, I am having her maintain her valACYclovir, progesterone, estradiol, buPROPion, Synthroid, Synthroid, fluticasone, and traMADol.  Meds ordered this encounter  Medications  . potassium chloride SA (KLOR-CON) 20 MEQ tablet    Sig: Take 1 tablet (20 mEq total) by mouth daily.    Dispense:  180 tablet    Refill:  0     Follow-up: Return in about 6 months (around 12/05/2020).  Scarlette Calico, MD

## 2020-06-07 NOTE — Patient Instructions (Signed)
Health Maintenance, Female Adopting a healthy lifestyle and getting preventive care are important in promoting health and wellness. Ask your health care provider about:  The right schedule for you to have regular tests and exams.  Things you can do on your own to prevent diseases and keep yourself healthy. What should I know about diet, weight, and exercise? Eat a healthy diet  Eat a diet that includes plenty of vegetables, fruits, low-fat dairy products, and lean protein.  Do not eat a lot of foods that are high in solid fats, added sugars, or sodium.   Maintain a healthy weight Body mass index (BMI) is used to identify weight problems. It estimates body fat based on height and weight. Your health care provider can help determine your BMI and help you achieve or maintain a healthy weight. Get regular exercise Get regular exercise. This is one of the most important things you can do for your health. Most adults should:  Exercise for at least 150 minutes each week. The exercise should increase your heart rate and make you sweat (moderate-intensity exercise).  Do strengthening exercises at least twice a week. This is in addition to the moderate-intensity exercise.  Spend less time sitting. Even light physical activity can be beneficial. Watch cholesterol and blood lipids Have your blood tested for lipids and cholesterol at 51 years of age, then have this test every 5 years. Have your cholesterol levels checked more often if:  Your lipid or cholesterol levels are high.  You are older than 51 years of age.  You are at high risk for heart disease. What should I know about cancer screening? Depending on your health history and family history, you may need to have cancer screening at various ages. This may include screening for:  Breast cancer.  Cervical cancer.  Colorectal cancer.  Skin cancer.  Lung cancer. What should I know about heart disease, diabetes, and high blood  pressure? Blood pressure and heart disease  High blood pressure causes heart disease and increases the risk of stroke. This is more likely to develop in people who have high blood pressure readings, are of African descent, or are overweight.  Have your blood pressure checked: ? Every 3-5 years if you are 18-39 years of age. ? Every year if you are 40 years old or older. Diabetes Have regular diabetes screenings. This checks your fasting blood sugar level. Have the screening done:  Once every three years after age 40 if you are at a normal weight and have a low risk for diabetes.  More often and at a younger age if you are overweight or have a high risk for diabetes. What should I know about preventing infection? Hepatitis B If you have a higher risk for hepatitis B, you should be screened for this virus. Talk with your health care provider to find out if you are at risk for hepatitis B infection. Hepatitis C Testing is recommended for:  Everyone born from 1945 through 1965.  Anyone with known risk factors for hepatitis C. Sexually transmitted infections (STIs)  Get screened for STIs, including gonorrhea and chlamydia, if: ? You are sexually active and are younger than 51 years of age. ? You are older than 51 years of age and your health care provider tells you that you are at risk for this type of infection. ? Your sexual activity has changed since you were last screened, and you are at increased risk for chlamydia or gonorrhea. Ask your health care provider   if you are at risk.  Ask your health care provider about whether you are at high risk for HIV. Your health care provider may recommend a prescription medicine to help prevent HIV infection. If you choose to take medicine to prevent HIV, you should first get tested for HIV. You should then be tested every 3 months for as long as you are taking the medicine. Pregnancy  If you are about to stop having your period (premenopausal) and  you may become pregnant, seek counseling before you get pregnant.  Take 400 to 800 micrograms (mcg) of folic acid every day if you become pregnant.  Ask for birth control (contraception) if you want to prevent pregnancy. Osteoporosis and menopause Osteoporosis is a disease in which the bones lose minerals and strength with aging. This can result in bone fractures. If you are 65 years old or older, or if you are at risk for osteoporosis and fractures, ask your health care provider if you should:  Be screened for bone loss.  Take a calcium or vitamin D supplement to lower your risk of fractures.  Be given hormone replacement therapy (HRT) to treat symptoms of menopause. Follow these instructions at home: Lifestyle  Do not use any products that contain nicotine or tobacco, such as cigarettes, e-cigarettes, and chewing tobacco. If you need help quitting, ask your health care provider.  Do not use street drugs.  Do not share needles.  Ask your health care provider for help if you need support or information about quitting drugs. Alcohol use  Do not drink alcohol if: ? Your health care provider tells you not to drink. ? You are pregnant, may be pregnant, or are planning to become pregnant.  If you drink alcohol: ? Limit how much you use to 0-1 drink a day. ? Limit intake if you are breastfeeding.  Be aware of how much alcohol is in your drink. In the [place] one drink equals one 12 oz bottle of beer (355 mL), one 5 oz glass of wine (148 mL), or one 1 oz glass of hard liquor (44 mL). General instructions  Schedule regular health, dental, and eye exams.  Stay current with your vaccines.  Tell your health care provider if: ? You often feel depressed. ? You have ever been abused or do not feel safe at home. Summary  Adopting a healthy lifestyle and getting preventive care are important in promoting health and wellness.  Follow your health care provider's instructions about healthy  diet, exercising, and getting tested or screened for diseases.  Follow your health care provider's instructions on monitoring your cholesterol and blood pressure. This information is not intended to replace advice given to you by your health care provider. Make sure you discuss any questions you have with your health care provider. Document Revised: 04/13/2018 Document Reviewed: 04/13/2018 Elsevier Patient Education  2021 [employer]  

## 2020-06-09 ENCOUNTER — Other Ambulatory Visit: Payer: Self-pay | Admitting: Internal Medicine

## 2020-06-09 DIAGNOSIS — I10 Essential (primary) hypertension: Secondary | ICD-10-CM

## 2020-06-10 ENCOUNTER — Other Ambulatory Visit: Payer: Self-pay | Admitting: Internal Medicine

## 2020-06-10 DIAGNOSIS — M8949 Other hypertrophic osteoarthropathy, multiple sites: Secondary | ICD-10-CM

## 2020-06-10 DIAGNOSIS — M159 Polyosteoarthritis, unspecified: Secondary | ICD-10-CM

## 2020-06-10 DIAGNOSIS — F325 Major depressive disorder, single episode, in full remission: Secondary | ICD-10-CM

## 2020-06-10 LAB — TISSUE TRANSGLUTAMINASE, IGA: (tTG) Ab, IgA: <1 U/mL

## 2020-06-10 LAB — GLIADIN ANTIBODIES, SERUM
Gliadin IgA: <1 U/mL
Gliadin IgG: <1 U/mL

## 2020-06-10 LAB — RETICULIN ANTIBODIES, IGA W TITER: Reticulin IgA Screen: NEGATIVE

## 2020-06-10 MED ORDER — DULOXETINE HCL 60 MG PO CPEP: 60.0000 mg | ORAL_CAPSULE | Freq: Every day | ORAL | 1 refills | Status: DC

## 2020-06-11 ENCOUNTER — Encounter: Payer: Self-pay | Admitting: Internal Medicine

## 2020-06-11 ENCOUNTER — Ambulatory Visit: Payer: 59 | Admitting: Internal Medicine

## 2020-06-25 ENCOUNTER — Other Ambulatory Visit: Payer: Self-pay

## 2020-06-25 ENCOUNTER — Ambulatory Visit (AMBULATORY_SURGERY_CENTER): Payer: 59

## 2020-06-25 VITALS — Ht 62.0 in | Wt 195.0 lb

## 2020-06-25 DIAGNOSIS — Z860100 Personal history of colon polyps, unspecified: Secondary | ICD-10-CM

## 2020-06-25 MED ORDER — PEG-KCL-NACL-NASULF-NA ASC-C 100 G PO SOLR: 1.0000 | Freq: Once | ORAL | 0 refills | Status: AC

## 2020-06-25 NOTE — Progress Notes (Signed)

## 2020-07-02 HISTORY — PX: COLONOSCOPY: SHX174

## 2020-07-03 ENCOUNTER — Encounter: Payer: Self-pay | Admitting: Internal Medicine

## 2020-07-07 ENCOUNTER — Other Ambulatory Visit: Payer: Self-pay | Admitting: Internal Medicine

## 2020-07-07 DIAGNOSIS — I10 Essential (primary) hypertension: Secondary | ICD-10-CM

## 2020-07-08 ENCOUNTER — Other Ambulatory Visit: Payer: Self-pay | Admitting: Internal Medicine

## 2020-07-08 DIAGNOSIS — F325 Major depressive disorder, single episode, in full remission: Secondary | ICD-10-CM

## 2020-07-08 DIAGNOSIS — M159 Polyosteoarthritis, unspecified: Secondary | ICD-10-CM

## 2020-07-08 DIAGNOSIS — M8949 Other hypertrophic osteoarthropathy, multiple sites: Secondary | ICD-10-CM

## 2020-07-09 ENCOUNTER — Encounter: Payer: Self-pay | Admitting: Internal Medicine

## 2020-07-09 ENCOUNTER — Ambulatory Visit (AMBULATORY_SURGERY_CENTER): Payer: 59 | Admitting: Internal Medicine

## 2020-07-09 ENCOUNTER — Other Ambulatory Visit: Payer: Self-pay

## 2020-07-09 VITALS — BP 133/82 | HR 66 | Temp 97.1°F | Resp 18 | Ht 62.0 in

## 2020-07-09 DIAGNOSIS — Z86010 Personal history of colonic polyps: Secondary | ICD-10-CM | POA: Diagnosis not present

## 2020-07-09 DIAGNOSIS — D123 Benign neoplasm of transverse colon: Secondary | ICD-10-CM | POA: Diagnosis not present

## 2020-07-09 DIAGNOSIS — D122 Benign neoplasm of ascending colon: Secondary | ICD-10-CM

## 2020-07-09 DIAGNOSIS — D128 Benign neoplasm of rectum: Secondary | ICD-10-CM

## 2020-07-09 LAB — HM COLONOSCOPY

## 2020-07-09 MED ORDER — SODIUM CHLORIDE 0.9 % IV SOLN: 500.0000 mL | Freq: Once | INTRAVENOUS | Status: DC

## 2020-07-09 NOTE — Progress Notes (Signed)
PT taken to PACU. Monitors in place. VSS. Report given to RN. 

## 2020-07-09 NOTE — Progress Notes (Signed)
Called to room to assist during endoscopic procedure.  Patient ID and intended procedure confirmed with present staff. Received instructions for my participation in the procedure from the performing physician.  

## 2020-07-09 NOTE — Patient Instructions (Signed)
YOU HAD AN ENDOSCOPIC PROCEDURE TODAY AT THE [facility]:   Refer to the procedure report that was given to you for any specific questions about what was found during the examination.  If the procedure report does not answer your questions, please call your gastroenterologist to clarify.  If you requested that your care partner not be given the details of your procedure findings, then the procedure report has been included in a sealed envelope for you to review at your convenience later.  YOU SHOULD EXPECT: Some feelings of bloating in the abdomen. Passage of more gas than usual.  Walking can help get rid of the air that was put into your GI tract during the procedure and reduce the bloating. If you had a lower endoscopy (such as a colonoscopy or flexible sigmoidoscopy) you may notice spotting of blood in your stool or on the toilet paper. If you underwent a bowel prep for your procedure, you may not have a normal bowel movement for a few days.  Please Note:  You might notice some irritation and congestion in your nose or some drainage.  This is from the oxygen used during your procedure.  There is no need for concern and it should clear up in a day or so.  SYMPTOMS TO REPORT IMMEDIATELY:  Following lower endoscopy (colonoscopy or flexible sigmoidoscopy):  Excessive amounts of blood in the stool  Significant tenderness or worsening of abdominal pains  Swelling of the abdomen that is new, acute  Fever of 100F or higher   For urgent or emergent issues, a gastroenterologist can be reached at any hour by calling [phone]. Do not use MyChart messaging for urgent concerns.    DIET:  We do recommend a small meal at first, but then you may proceed to your regular diet.  Drink plenty of fluids but you should avoid alcoholic beverages for 24 hours.  MEDICATIONS:  Continue present medications.  Please see handouts given to you by your recovery nurse.  Thank you for allowing us to  provide for your healthcare needs today.  ACTIVITY:  You should plan to take it easy for the rest of today and you should NOT DRIVE or use heavy machinery until tomorrow (because of the sedation medicines used during the test).    FOLLOW UP: Our staff will call the number listed on your records 48-72 hours following your procedure to check on you and address any questions or concerns that you may have regarding the information given to you following your procedure. If we do not reach you, we will leave a message.  We will attempt to reach you two times.  During this call, we will ask if you have developed any symptoms of COVID 19. If you develop any symptoms (ie: fever, flu-like symptoms, shortness of breath, cough etc.) before then, please call [phone].  If you test positive for Covid 19 in the 2 weeks post procedure, please call and report this information to us.    If any biopsies were taken you will be contacted by phone or by letter within the next 1-3 weeks.  Please call us at [phone] if you have not heard about the biopsies in 3 weeks.    SIGNATURES/CONFIDENTIALITY: You and/or your care partner have signed paperwork which will be entered into your electronic medical record.  These signatures attest to the fact that that the information above on your After Visit Summary has been reviewed and is understood.  Full responsibility of the   confidentiality of this discharge information lies with you and/or your care-partner.  

## 2020-07-09 NOTE — Op Note (Signed)
[facility] Patient Name: Holly Mcmillan Procedure Date: 07/09/2020 1:25 PM MRN: [id number] Endoscopist: Docia Chuck. Henrene Pastor , MD Age: 50 Referring MD:  Date of Birth: [birth year] Gender: Female Account #: [id number] Procedure:                Colonoscopy with cold snare polypectomy x 5 Indications:              High risk colon cancer surveillance: Personal                            history of non-advanced adenomas, High risk colon                            cancer surveillance: Personal history of sessile                            serrated colon polyp (less than 10 mm in size) with                            no dysplasia. Previous examination May 2014.                            Overdue for follow-up Medicines:                Monitored Anesthesia Care Procedure:                Pre-Anesthesia Assessment:                           - Prior to the procedure, a History and Physical                            was performed, and patient medications and                            allergies were reviewed. The patient's tolerance of                            previous anesthesia was also reviewed. The risks                            and benefits of the procedure and the sedation                            options and risks were discussed with the patient.                            All questions were answered, and informed consent                            was obtained. Prior Anticoagulants: The patient has                            taken no previous anticoagulant or antiplatelet  agents. ASA Grade Assessment: II - A patient with                            mild systemic disease. After reviewing the risks                            and benefits, the patient was deemed in                            satisfactory condition to undergo the procedure.                           After obtaining informed consent, the colonoscope                            was passed under  direct vision. Throughout the                            procedure, the patient's blood pressure, pulse, and                            oxygen saturations were monitored continuously. The                            Olympus CF-HQ190L ([id number]) Colonoscope was                            introduced through the anus and advanced to the the                            cecum, identified by appendiceal orifice and                            ileocecal valve. The ileocecal valve, appendiceal                            orifice, and rectum were photographed. The quality                            of the bowel preparation was good. The colonoscopy                            was performed without difficulty. The patient                            tolerated the procedure well. The bowel preparation                            used was SUPREP via split dose instruction. Scope In: 1:44:17 PM Scope Out: 2:12:14 PM Scope Withdrawal Time: 0 hours 25 minutes 12 seconds  Total Procedure Duration: 0 hours 27 minutes 57 seconds  Findings:                 Five polyps were found in the rectum,  transverse                            colon and ascending colon. The polyps were 1 to 6                            mm in size. These polyps were removed with a cold                            snare. Resection and retrieval were complete.                           Multiple diverticula were found in the cecum, left                            colon and right colon.                           Internal hemorrhoids were found during retroflexion.                           The exam was otherwise without abnormality on                            direct and retroflexion views. Complications:            No immediate complications. Estimated blood loss:                            None. Estimated Blood Loss:     Estimated blood loss: none. Impression:               - Five 1 to 6 mm polyps in the rectum, in the                             transverse colon and in the ascending colon,                            removed with a cold snare. Resected and retrieved.                           - Diverticulosis in the cecum, in the left colon                            and in the right colon.                           - Internal hemorrhoids.                           - The examination was otherwise normal on direct                            and retroflexion views. Recommendation:           - Repeat colonoscopy in 3 years  for surveillance.                           - Patient has a contact number available for                            emergencies. The signs and symptoms of potential                            delayed complications were discussed with the                            patient. Return to normal activities tomorrow.                            Written discharge instructions were provided to the                            patient.                           - Resume previous diet.                           - Continue present medications.                           - Await pathology results. Docia Chuck. Henrene Pastor, MD 07/09/2020 2:26:04 PM This report has been signed electronically.

## 2020-07-11 ENCOUNTER — Telehealth: Payer: Self-pay | Admitting: *Deleted

## 2020-07-11 ENCOUNTER — Telehealth: Payer: Self-pay

## 2020-07-11 NOTE — Telephone Encounter (Signed)
  Follow up Call-  Call back number 07/09/2020  Post procedure Call Back phone  # [phone]  Permission to leave phone message Yes  Some recent data might be hidden     Patient questions:  Do you have a fever, pain , or abdominal swelling? No. Pain Score  0 *  Have you tolerated food without any problems? Yes.    Have you been able to return to your normal activities? Yes.    Do you have any questions about your discharge instructions: Diet   No. Medications  No. Follow up visit  No.  Do you have questions or concerns about your Care? No.  Actions: * If pain score is 4 or above: No action needed, pain <4.  1. Have you developed a fever since your procedure? no  2.   Have you had an respiratory symptoms (SOB or cough) since your procedure? no  3.   Have you tested positive for COVID 19 since your procedure no  4.   Have you had any family members/close contacts diagnosed with the COVID 19 since your procedure?  no   If yes to any of these questions please route to Joylene John, RN and Joella Prince, RN

## 2020-07-11 NOTE — Telephone Encounter (Signed)
Left message on follow up call. 

## 2020-07-16 ENCOUNTER — Encounter: Payer: Self-pay | Admitting: Internal Medicine

## 2020-08-31 ENCOUNTER — Other Ambulatory Visit: Payer: Self-pay | Admitting: Internal Medicine

## 2020-08-31 DIAGNOSIS — F325 Major depressive disorder, single episode, in full remission: Secondary | ICD-10-CM

## 2020-10-30 ENCOUNTER — Telehealth: Payer: Self-pay | Admitting: Internal Medicine

## 2020-10-30 NOTE — Telephone Encounter (Signed)
   Patient is requesting lab orders be placed before her 8/2 appointment. She can be reached at [phone]. Please advise

## 2020-10-30 NOTE — Telephone Encounter (Signed)
Called pt, LVM stating that PCP prefers pt's having lab work done same day as OV.

## 2020-11-12 ENCOUNTER — Other Ambulatory Visit: Payer: Self-pay | Admitting: Internal Medicine

## 2020-11-12 DIAGNOSIS — E039 Hypothyroidism, unspecified: Secondary | ICD-10-CM

## 2020-11-19 ENCOUNTER — Other Ambulatory Visit: Payer: Self-pay | Admitting: Internal Medicine

## 2020-11-19 DIAGNOSIS — J301 Allergic rhinitis due to pollen: Secondary | ICD-10-CM

## 2020-11-20 IMAGING — DX DG SHOULDER 2+V*R*
3 series · 3 of 3 positions shown · non-contrast
Comparison: Left shoulder radiograph dated 04/17/2019.

CLINICAL DATA: 49-year-old female with right shoulder pain.

EXAM:
RIGHT SHOULDER - 2+ VIEW

[shoulder (grashey view) ap]
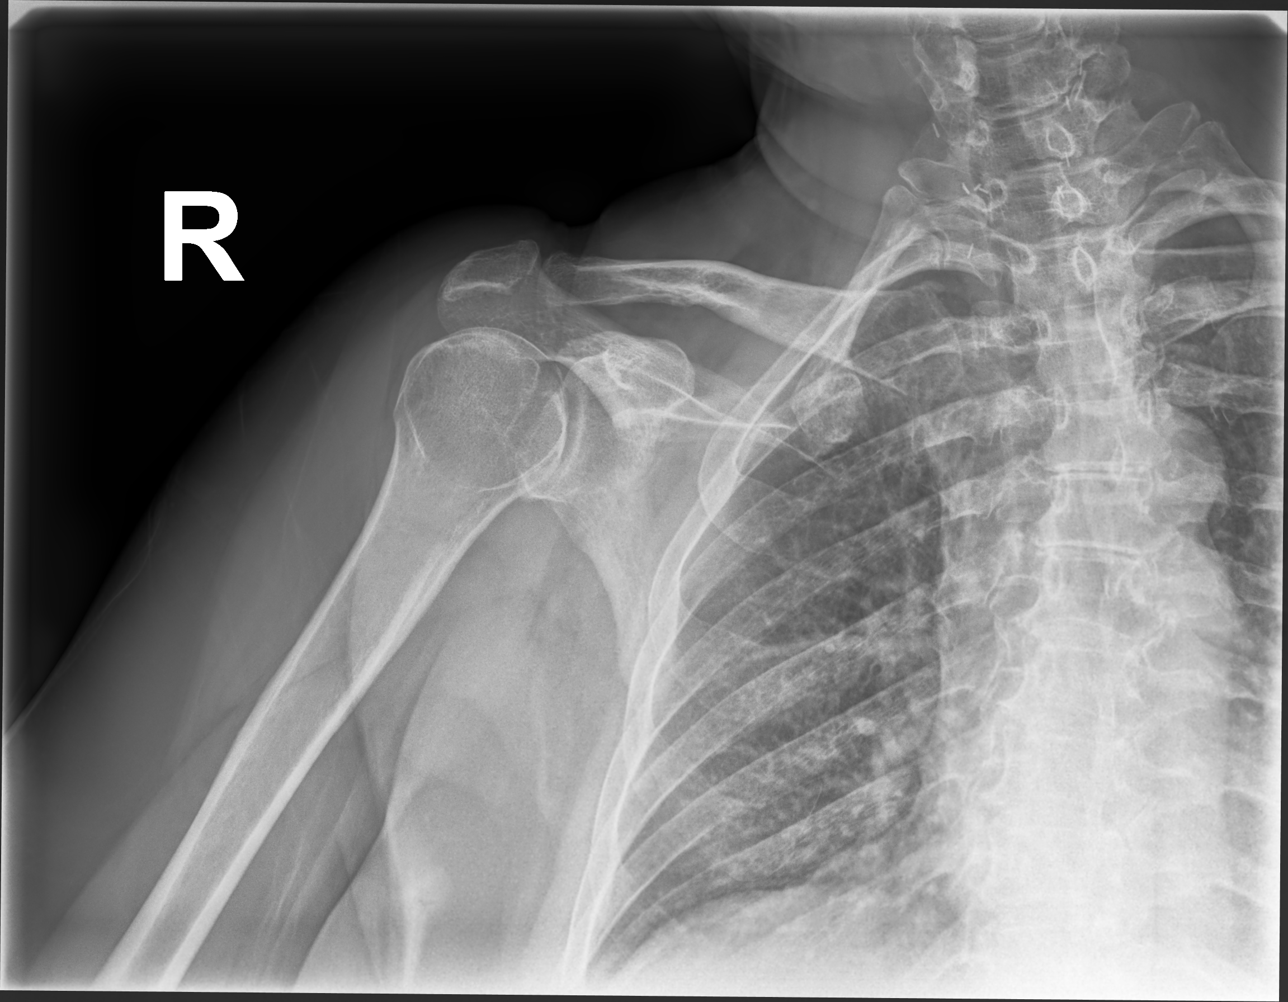

[shoulder (y view) lat]
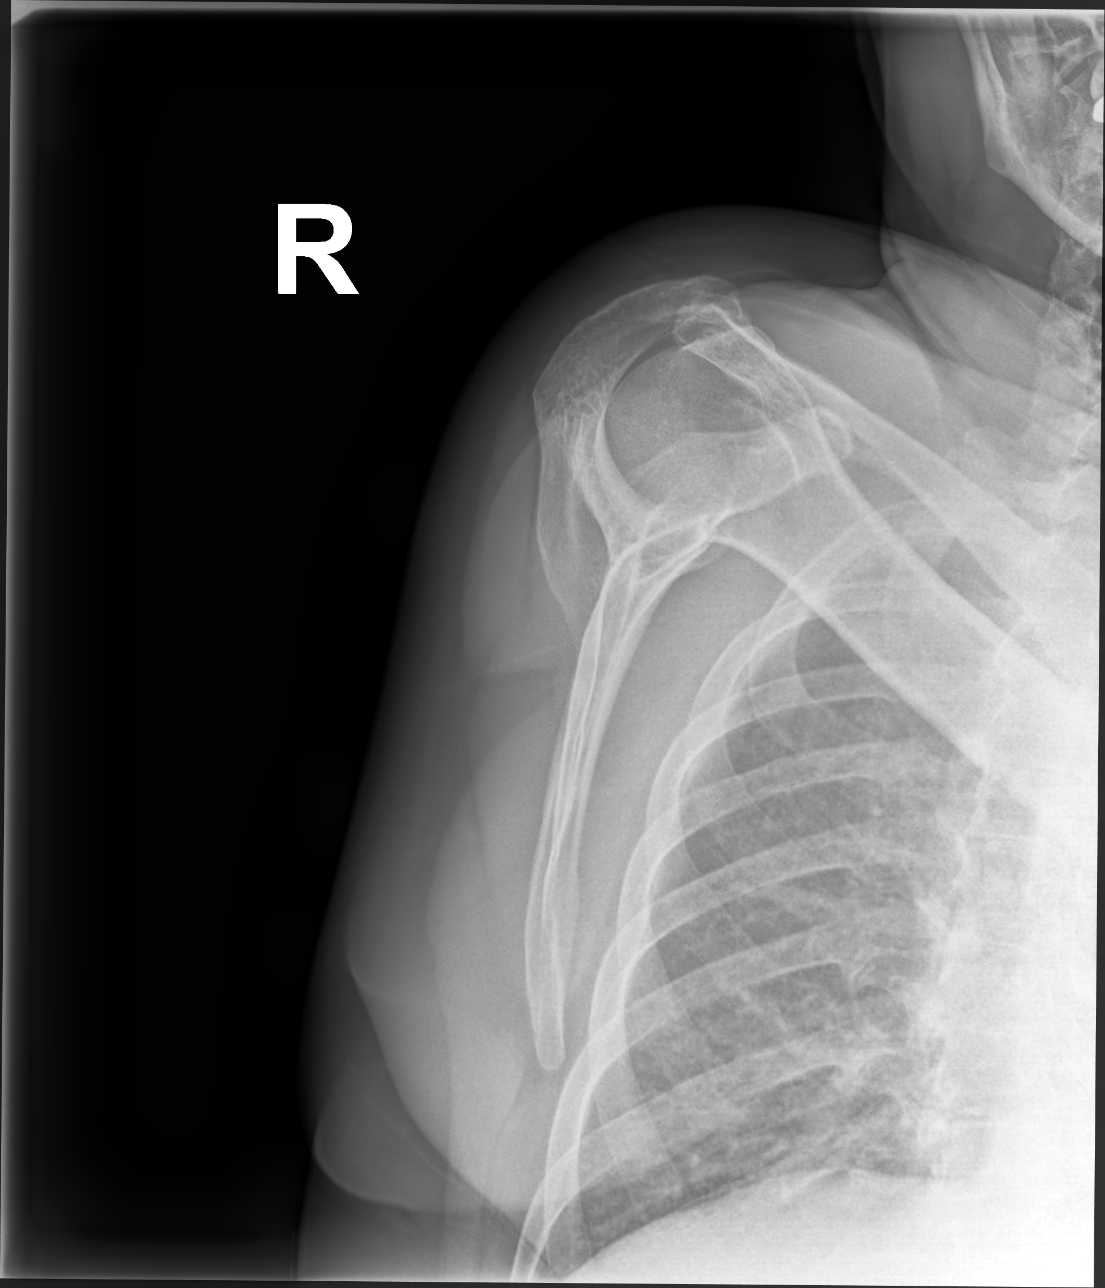

[shoulder axial 45°]
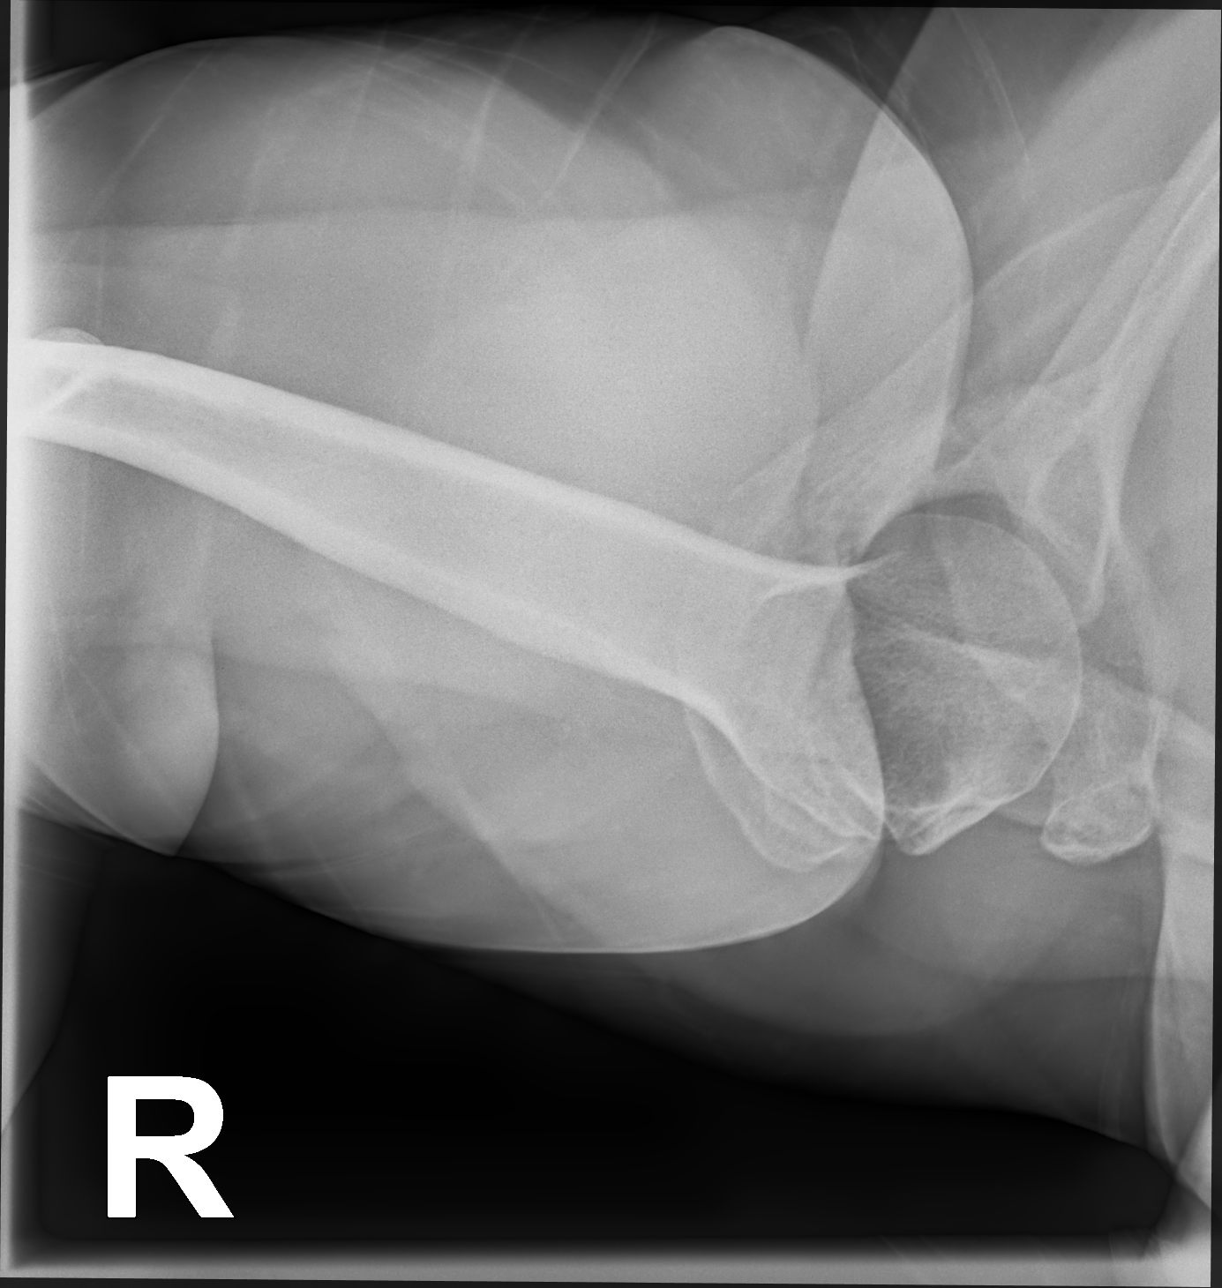

[3 of 3 positions shown; findings below may reference images not displayed]

FINDINGS: There is no acute fracture or dislocation. The bones are osteopenic.
No significant arthritic changes. The soft tissues are unremarkable.
IMPRESSION: Negative.

## 2020-11-24 ENCOUNTER — Other Ambulatory Visit: Payer: Self-pay | Admitting: Internal Medicine

## 2020-11-24 DIAGNOSIS — E876 Hypokalemia: Secondary | ICD-10-CM

## 2020-11-24 DIAGNOSIS — I10 Essential (primary) hypertension: Secondary | ICD-10-CM

## 2020-12-03 ENCOUNTER — Other Ambulatory Visit: Payer: Self-pay

## 2020-12-03 ENCOUNTER — Encounter: Payer: Self-pay | Admitting: Internal Medicine

## 2020-12-03 ENCOUNTER — Ambulatory Visit (INDEPENDENT_AMBULATORY_CARE_PROVIDER_SITE_OTHER): Payer: 59 | Admitting: Internal Medicine

## 2020-12-03 ENCOUNTER — Other Ambulatory Visit: Payer: Self-pay | Admitting: Internal Medicine

## 2020-12-03 VITALS — BP 136/84 | HR 81 | Temp 98.3°F | Resp 16 | Ht 62.0 in | Wt 200.0 lb

## 2020-12-03 DIAGNOSIS — G4452 New daily persistent headache (NDPH): Secondary | ICD-10-CM | POA: Insufficient documentation

## 2020-12-03 DIAGNOSIS — E876 Hypokalemia: Secondary | ICD-10-CM | POA: Diagnosis not present

## 2020-12-03 DIAGNOSIS — B001 Herpesviral vesicular dermatitis: Secondary | ICD-10-CM | POA: Diagnosis not present

## 2020-12-03 DIAGNOSIS — F325 Major depressive disorder, single episode, in full remission: Secondary | ICD-10-CM

## 2020-12-03 DIAGNOSIS — I10 Essential (primary) hypertension: Secondary | ICD-10-CM | POA: Diagnosis not present

## 2020-12-03 DIAGNOSIS — M8949 Other hypertrophic osteoarthropathy, multiple sites: Secondary | ICD-10-CM

## 2020-12-03 DIAGNOSIS — T502X5A Adverse effect of carbonic-anhydrase inhibitors, benzothiadiazides and other diuretics, initial encounter: Secondary | ICD-10-CM

## 2020-12-03 DIAGNOSIS — E039 Hypothyroidism, unspecified: Secondary | ICD-10-CM

## 2020-12-03 DIAGNOSIS — G43011 Migraine without aura, intractable, with status migrainosus: Secondary | ICD-10-CM

## 2020-12-03 DIAGNOSIS — M159 Polyosteoarthritis, unspecified: Secondary | ICD-10-CM

## 2020-12-03 MED ORDER — VALACYCLOVIR HCL 1 G PO TABS: ORAL_TABLET | ORAL | 2 refills | Status: DC

## 2020-12-03 MED ORDER — NURTEC 75 MG PO TBDP: 1.0000 | ORAL_TABLET | ORAL | 1 refills | Status: DC

## 2020-12-03 NOTE — Progress Notes (Signed)
Subjective:  Patient ID: Holly Mcmillan, female    DOB: 26-Feb-1971  Age: 50 y.o. MRN: [id number]  CC: Hypertension, Hypothyroidism, and Headache  This visit occurred during the SARS-CoV-2 public health emergency.  Safety protocols were in place, including screening questions prior to the visit, additional usage of staff PPE, and extensive cleaning of exam room while observing appropriate contact time as indicated for disinfecting solutions.    HPI Holly Mcmillan presents for f/up -   She has a history of chronic headaches but for the last 3 weeks her headaches have worsened.  She tells me she is waking up in the morning with a throbbing pain in the frontal region.  The headache also extends over her entire head.  There is a nocturnal component to the headache as well.  She complains of nausea but has had no vomiting.  She has not gotten much symptom relief with Goody powders and Tylenol.  She complains of fatigue and weight gain.  She denies paresthesias or ataxia.  Outpatient Medications Prior to Visit  Medication Sig Dispense Refill   buPROPion (WELLBUTRIN XL) 300 MG 24 hr tablet TAKE 1 TABLET BY MOUTH EVERY DAY 30 tablet 5   estradiol (ESTRACE) 2 MG tablet Take 2-4 mg by mouth daily.     fluticasone (FLONASE) 50 MCG/ACT nasal spray SPRAY 2 SPRAYS INTO EACH NOSTRIL EVERY DAY 16 mL 5   KLOR-CON M20 20 MEQ tablet TAKE 1 TABLET BY MOUTH EVERY DAY 30 tablet 1   progesterone (PROMETRIUM) 100 MG capsule Take 100 mg by mouth at bedtime.     SYNTHROID 200 MCG tablet TAKE 1 TABLET DAILY BEFORE BREAKFAST FOR HYPOTHYROIDISM 90 tablet 0   DULoxetine (CYMBALTA) 60 MG capsule Take 1 capsule (60 mg total) by mouth daily. 90 capsule 1   SYNTHROID 100 MCG tablet TAKE 1 TABLET DAILY FOR HYPOTHYROIDISM 90 tablet 0   traMADol (ULTRAM) 50 MG tablet Take 50 mg by mouth 3 (three) times daily as needed.     valACYclovir (VALTREX) 1000 MG tablet TAKE 2 TABLETS BY MOUTH 2 TIMES DAILY FOR 2 DOSES. 4 tablet 2   0.9 %   sodium chloride infusion      No facility-administered medications prior to visit.    ROS Review of Systems  Constitutional:  Positive for fatigue. Negative for appetite change, chills, diaphoresis and unexpected weight change.  HENT: Negative.    Eyes: Negative.   Respiratory:  Negative for cough, chest tightness, shortness of breath and wheezing.   Gastrointestinal:  Positive for nausea. Negative for abdominal pain, diarrhea and vomiting.  Endocrine: Negative for cold intolerance and heat intolerance.  Genitourinary: Negative.  Negative for difficulty urinating and dysuria.  Musculoskeletal: Negative.  Negative for arthralgias, back pain and myalgias.  Skin: Negative.  Negative for color change and rash.  Neurological:  Positive for headaches. Negative for syncope, weakness and numbness.  Hematological:  Negative for adenopathy. Does not bruise/bleed easily.   Objective:  BP 136/84 (BP Location: Left Arm, Patient Position: Sitting, Cuff Size: Large)   Pulse 81   Temp 98.3 F (36.8 C) (Oral)   Resp 16   Ht '5\' 2"'$  (1.575 m)   Wt 200 lb (90.7 kg)   SpO2 95%   BMI 36.58 kg/m   BP Readings from Last 3 Encounters:  12/03/20 136/84  07/09/20 133/82  06/07/20 110/80    Wt Readings from Last 3 Encounters:  12/03/20 200 lb (90.7 kg)  06/25/20 195 lb (88.5 kg)  06/07/20 193 lb 6.4 oz (87.7 kg)    Physical Exam Vitals reviewed.  HENT:     Nose: Nose normal.     Mouth/Throat:     Mouth: Mucous membranes are moist.  Eyes:     General: No scleral icterus.    Extraocular Movements: Extraocular movements intact.     Conjunctiva/sclera: Conjunctivae normal.     Pupils: Pupils are equal, round, and reactive to light.  Cardiovascular:     Rate and Rhythm: Normal rate and regular rhythm.     Heart sounds: No murmur heard. Pulmonary:     Effort: Pulmonary effort is normal.     Breath sounds: No stridor. No wheezing, rhonchi or rales.  Abdominal:     General: Abdomen is flat.      Palpations: There is no mass.     Tenderness: There is no abdominal tenderness. There is no guarding.     Hernia: No hernia is present.  Musculoskeletal:        General: Normal range of motion.     Cervical back: Normal range of motion and neck supple. No rigidity.     Right lower leg: No edema.     Left lower leg: No edema.  Lymphadenopathy:     Cervical: No cervical adenopathy.  Skin:    General: Skin is warm and dry.     Coloration: Skin is not pale.  Neurological:     General: No focal deficit present.     Mental Status: She is alert and oriented to person, place, and time. Mental status is at baseline.    Lab Results  Component Value Date   WBC 11.5 (H) 12/03/2020   HGB 13.8 12/03/2020   HCT 41.7 12/03/2020   PLT 359.0 12/03/2020   GLUCOSE 78 12/03/2020   CHOL 251 (H) 06/07/2020   TRIG 123.0 06/07/2020   HDL 63.20 06/07/2020   LDLDIRECT 173.0 10/25/2018   LDLCALC 163 (H) 06/07/2020   ALT 20 01/10/2019   AST 17 01/10/2019   NA 139 12/03/2020   K 4.2 12/03/2020   CL 102 12/03/2020   CREATININE 0.78 12/03/2020   BUN 17 12/03/2020   CO2 27 12/03/2020   TSH 0.01 (L) 12/03/2020   HGBA1C 5.5 03/16/2016    US RENAL  Result Date: 04/12/2020 CLINICAL DATA:  eval cyst kidney seen on MRI EXAM: RENAL / URINARY TRACT ULTRASOUND COMPLETE COMPARISON:  03/17/2020, 01/16/2019 and prior. FINDINGS: Right Kidney: Renal measurements: 11.8 x 5.3 x 4.4 cm = volume: 145 mL. Echogenicity within normal limits. No mass or hydronephrosis visualized. Left Kidney: Renal measurements: 11.8 x 6.2 x 5.9 cm = volume: 225 mL. Echogenicity within normal limits. No mass or hydronephrosis visualized. Bladder: Appears normal for degree of bladder distention. Other: None. IMPRESSION: Unremarkable renal ultrasound. Prior sub -5 mm left renal cyst seen on MRI is not demonstrated on this exam. Electronically Signed   By: Primitivo Gauze M.D.   On: 04/12/2020 14:43    Assessment & Plan:   Holly Mcmillan  was seen today for hypertension, hypothyroidism and headache.  Diagnoses and all orders for this visit:  Essential hypertension- Her blood pressure is adequately well controlled. -     Basic metabolic panel; Future -     Magnesium; Future -     CBC with Differential/Platelet; Future -     CBC with Differential/Platelet -     Magnesium -     Basic metabolic panel  Acquired hypothyroidism- Her TSH is suppressed.  I  recommended that she decrease her T4 dosage. -     TSH; Future -     TSH  Diuretic-induced hypokalemia- Her potassium level is normal now. -     Basic metabolic panel; Future -     Magnesium; Future -     Magnesium -     Basic metabolic panel  Fever blister -     valACYclovir (VALTREX) 1000 MG tablet; TAKE 2 TABLETS BY MOUTH 2 TIMES DAILY FOR 2 DOSES.  New daily persistent headache- She has a worsening headache over the age of 75.  I recommended that she undergo an MRI of the brain to see if there is a mass, tumor, NPH, bleed, or other lesion that would explain her headache. -     MR Brain Wo Contrast; Future  Intractable migraine without aura and with status migrainosus- I recommended that she start treating this with a CGRP antagonist. -     CBC with Differential/Platelet; Future -     Sedimentation rate; Future -     MR Brain Wo Contrast; Future -     Sedimentation rate -     CBC with Differential/Platelet -     Rimegepant Sulfate (NURTEC) 75 MG TBDP; Take 1 tablet by mouth every other day.  Other orders -     Discontinue: Rimegepant Sulfate (NURTEC) 75 MG TBDP; Take 1 tablet by mouth every other day.  I have discontinued Teisha Kazmierski. Darsey's traMADol. I am also having her maintain her progesterone, estradiol, buPROPion, Synthroid, fluticasone, Klor-Con M20, valACYclovir, and Nurtec. We will stop administering sodium chloride.  Meds ordered this encounter  Medications   valACYclovir (VALTREX) 1000 MG tablet    Sig: TAKE 2 TABLETS BY MOUTH 2 TIMES DAILY FOR 2 DOSES.     Dispense:  4 tablet    Refill:  2   DISCONTD: Rimegepant Sulfate (NURTEC) 75 MG TBDP    Sig: Take 1 tablet by mouth every other day.    Dispense:  46 tablet    Refill:  1   Rimegepant Sulfate (NURTEC) 75 MG TBDP    Sig: Take 1 tablet by mouth every other day.    Dispense:  46 tablet    Refill:  1     Follow-up: Return in about 3 weeks (around 12/24/2020).  Scarlette Calico, MD

## 2020-12-03 NOTE — Patient Instructions (Signed)
Migraine Headache A migraine headache is an intense, throbbing pain on one side or both sides of the head. Migraine headaches may also cause other symptoms, such as nausea, vomiting, and sensitivity to light and noise. A migraine headache can last from 4 hours to 3 days. Talk with your doctor about what things may bring on (trigger) your migraine headaches. What are the causes? The exact cause of this condition is not known. However, a migraine may be caused when nerves in the brain become irritated and release chemicals that cause inflammation of blood vessels. This inflammation causes pain. This condition may be triggered or caused by: Drinking alcohol. Smoking. Taking medicines, such as: Medicine used to treat chest pain (nitroglycerin). Birth control pills. Estrogen. Certain blood pressure medicines. Eating or drinking products that contain nitrates, glutamate, aspartame, or tyramine. Aged cheeses, chocolate, or caffeine may also be triggers. Doing physical activity. Other things that may trigger a migraine headache include: Menstruation. Pregnancy. Hunger. Stress. Lack of sleep or too much sleep. Weather changes. Fatigue. What increases the risk? The following factors may make you more likely to experience migraine headaches: Being a certain age. This condition is more common in people who are 25-55 years old. Being female. Having a family history of migraine headaches. Being Caucasian. Having a mental health condition, such as depression or anxiety. Being obese. What are the signs or symptoms? The main symptom of this condition is pulsating or throbbing pain. This pain may: Happen in any area of the head, such as on one side or both sides. Interfere with daily activities. Get worse with physical activity. Get worse with exposure to bright lights or loud noises. Other symptoms may include: Nausea. Vomiting. Dizziness. General sensitivity to bright lights, loud noises, or  smells. Before you get a migraine headache, you may get warning signs (an aura). An aura may include: Seeing flashing lights or having blind spots. Seeing bright spots, halos, or zigzag lines. Having tunnel vision or blurred vision. Having numbness or a tingling feeling. Having trouble talking. Having muscle weakness. Some people have symptoms after a migraine headache (postdromal phase), such as: Feeling tired. Difficulty concentrating. How is this diagnosed? A migraine headache can be diagnosed based on: Your symptoms. A physical exam. Tests, such as: CT scan or an MRI of the head. These imaging tests can help rule out other causes of headaches. Taking fluid from the spine (lumbar puncture) and analyzing it (cerebrospinal fluid analysis, or CSF analysis). How is this treated? This condition may be treated with medicines that: Relieve pain. Relieve nausea. Prevent migraine headaches. Treatment for this condition may also include: Acupuncture. Lifestyle changes like avoiding foods that trigger migraine headaches. Biofeedback. Cognitive behavioral therapy. Follow these instructions at home: Medicines Take over-the-counter and prescription medicines only as told by your health care provider. Ask your health care provider if the medicine prescribed to you: Requires you to avoid driving or using heavy machinery. Can cause constipation. You may need to take these actions to prevent or treat constipation: Drink enough fluid to keep your urine pale yellow. Take over-the-counter or prescription medicines. Eat foods that are high in fiber, such as beans, whole grains, and fresh fruits and vegetables. Limit foods that are high in fat and processed sugars, such as fried or sweet foods. Lifestyle Do not drink alcohol. Do not use any products that contain nicotine or tobacco, such as cigarettes, e-cigarettes, and chewing tobacco. If you need help quitting, ask your health care  provider. Get at least 8   hours of sleep every night. Find ways to manage stress, such as meditation, deep breathing, or yoga. General instructions   Keep a journal to find out what may trigger your migraine headaches. For example, write down: What you eat and drink. How much sleep you get. Any change to your diet or medicines. If you have a migraine headache: Avoid things that make your symptoms worse, such as bright lights. It may help to lie down in a dark, quiet room. Do not drive or use heavy machinery. Ask your health care provider what activities are safe for you while you are experiencing symptoms. Keep all follow-up visits as told by your health care provider. This is important. Contact a health care provider if: You develop symptoms that are different or more severe than your usual migraine headache symptoms. You have more than 15 headache days in one month. Get help right away if: Your migraine headache becomes severe. Your migraine headache lasts longer than 72 hours. You have a fever. You have a stiff neck. You have vision loss. Your muscles feel weak or like you cannot control them. You start to lose your balance often. You have trouble walking. You faint. You have a seizure. Summary A migraine headache is an intense, throbbing pain on one side or both sides of the head. Migraines may also cause other symptoms, such as nausea, vomiting, and sensitivity to light and noise. This condition may be treated with medicines and lifestyle changes. You may also need to avoid certain things that trigger a migraine headache. Keep a journal to find out what may trigger your migraine headaches. Contact your health care provider if you have more than 15 headache days in a month or you develop symptoms that are different or more severe than your usual migraine headache symptoms. This information is not intended to replace advice given to you by your health care provider. Make sure you  discuss any questions you have with your health care provider. Document Revised: 08/12/2018 Document Reviewed: 06/02/2018 Elsevier Patient Education  2022 [employer]  

## 2020-12-04 LAB — CBC WITH DIFFERENTIAL/PLATELET
Basophils Absolute: 0.1 10*3/uL (ref 0.0–0.1)
Basophils Relative: 0.6 % (ref 0.0–3.0)
Eosinophils Absolute: 0.1 10*3/uL (ref 0.0–0.7)
Eosinophils Relative: 0.8 % (ref 0.0–5.0)
HCT: 41.7 % (ref 36.0–46.0)
Hemoglobin: 13.8 g/dL (ref 12.0–15.0)
Lymphocytes Relative: 25.8 % (ref 12.0–46.0)
Lymphs Abs: 3 10*3/uL (ref 0.7–4.0)
MCHC: 33.2 g/dL (ref 30.0–36.0)
MCV: 92.6 fl (ref 78.0–100.0)
Monocytes Absolute: 1.1 10*3/uL — ABNORMAL HIGH (ref 0.1–1.0)
Monocytes Relative: 9.9 % (ref 3.0–12.0)
Neutro Abs: 7.2 10*3/uL (ref 1.4–7.7)
Neutrophils Relative %: 62.9 % (ref 43.0–77.0)
Platelets: 359 10*3/uL (ref 150.0–400.0)
RBC: 4.5 Mil/uL (ref 3.87–5.11)
RDW: 12.8 % (ref 11.5–15.5)
WBC: 11.5 10*3/uL — ABNORMAL HIGH (ref 4.0–10.5)

## 2020-12-04 LAB — BASIC METABOLIC PANEL
BUN: 17 mg/dL (ref 6–23)
CO2: 27 mEq/L (ref 19–32)
Calcium: 9.2 mg/dL (ref 8.4–10.5)
Chloride: 102 mEq/L (ref 96–112)
Creatinine, Ser: 0.78 mg/dL (ref 0.40–1.20)
GFR: 87.92 mL/min (ref >60.00)
Glucose, Bld: 78 mg/dL (ref 70–99)
Potassium: 4.2 mEq/L (ref 3.5–5.1)
Sodium: 139 mEq/L (ref 135–145)

## 2020-12-04 LAB — MAGNESIUM: Magnesium: 2.1 mg/dL (ref 1.5–2.5)

## 2020-12-04 LAB — SEDIMENTATION RATE: Sed Rate: 21 mm/hr (ref 0–30)

## 2020-12-04 LAB — TSH: TSH: 0.01 u[IU]/mL — ABNORMAL LOW (ref 0.35–5.50)

## 2020-12-05 MED ORDER — NURTEC 75 MG PO TBDP: 1.0000 | ORAL_TABLET | ORAL | 1 refills | Status: DC

## 2020-12-10 ENCOUNTER — Encounter: Payer: Self-pay | Admitting: Internal Medicine

## 2020-12-12 ENCOUNTER — Telehealth: Payer: Self-pay | Admitting: Internal Medicine

## 2020-12-12 NOTE — Telephone Encounter (Signed)
Nurtec °

## 2020-12-15 ENCOUNTER — Ambulatory Visit
Admission: RE | Admit: 2020-12-15 | Discharge: 2020-12-15 | Disposition: A | Discharge status: Home / Self Care | Payer: 59 | Source: Ambulatory Visit | Attending: Internal Medicine | Admitting: Internal Medicine

## 2020-12-15 ENCOUNTER — Other Ambulatory Visit: Payer: Self-pay

## 2020-12-15 DIAGNOSIS — G4452 New daily persistent headache (NDPH): Secondary | ICD-10-CM

## 2020-12-15 DIAGNOSIS — G43011 Migraine without aura, intractable, with status migrainosus: Secondary | ICD-10-CM

## 2020-12-16 ENCOUNTER — Telehealth: Payer: Self-pay

## 2020-12-16 ENCOUNTER — Other Ambulatory Visit: Payer: Self-pay | Admitting: Internal Medicine

## 2020-12-16 DIAGNOSIS — R930 Abnormal findings on diagnostic imaging of skull and head, not elsewhere classified: Secondary | ICD-10-CM | POA: Insufficient documentation

## 2020-12-16 DIAGNOSIS — J358 Other chronic diseases of tonsils and adenoids: Secondary | ICD-10-CM

## 2020-12-16 NOTE — Telephone Encounter (Signed)
Lee from Radiology called with results for pt MRI/CT scan. He states that "  No evidence of acute intracranial abnormality." Report is available in Epic.

## 2020-12-17 ENCOUNTER — Encounter: Payer: Self-pay | Admitting: Internal Medicine

## 2020-12-20 ENCOUNTER — Telehealth: Payer: Self-pay

## 2020-12-20 NOTE — Telephone Encounter (Signed)
Warren Lacy with [facility] is requesting that the PA the pharmacies has faxed over for the pts Nurtec be sent back a/w supporting office notes.  [phone] (option 1) Fax:[phone]

## 2020-12-25 ENCOUNTER — Encounter: Payer: Self-pay | Admitting: Internal Medicine

## 2020-12-26 NOTE — Telephone Encounter (Signed)
PA forms have been completed and faxed back

## 2021-01-16 ENCOUNTER — Telehealth: Payer: Self-pay | Admitting: Internal Medicine

## 2021-01-16 ENCOUNTER — Other Ambulatory Visit: Payer: Self-pay | Admitting: Internal Medicine

## 2021-01-16 NOTE — Telephone Encounter (Signed)
Pt stated that she has been taking the medication as prescribed "every other day" however she also mentioned that she hasn't noticed any difference. She still has at least 2 severe headaches a week. Her next refill should be arriving via mail today or tomorrow.

## 2021-01-16 NOTE — Telephone Encounter (Signed)
Patient calling in about severe migraines  Says she has been having them since last visit w/ [facility]  Patient has neuro appt scheduled for Nov  Patient says she can not wait that long to do something about the migraines  Would like for provider to call her w/ some suggestions on what she can do  [phone]

## 2021-01-17 NOTE — Telephone Encounter (Signed)
Pt stated that she is unsure. Her life factors and stress level has been unchanged. However, she isn't understanding the headaches every other week.   Would you like to see her for an OV to discuss further?

## 2021-01-17 NOTE — Telephone Encounter (Signed)
Pt has been scheduled for 9/19 @ 11.20am

## 2021-01-20 ENCOUNTER — Encounter: Payer: Self-pay | Admitting: Internal Medicine

## 2021-01-20 ENCOUNTER — Ambulatory Visit (INDEPENDENT_AMBULATORY_CARE_PROVIDER_SITE_OTHER): Payer: 59 | Admitting: Internal Medicine

## 2021-01-20 ENCOUNTER — Other Ambulatory Visit: Payer: Self-pay

## 2021-01-20 VITALS — BP 142/88 | HR 82 | Temp 98.2°F | Ht 62.0 in | Wt 210.0 lb

## 2021-01-20 DIAGNOSIS — R0683 Snoring: Secondary | ICD-10-CM | POA: Diagnosis not present

## 2021-01-20 DIAGNOSIS — I10 Essential (primary) hypertension: Secondary | ICD-10-CM

## 2021-01-20 DIAGNOSIS — E039 Hypothyroidism, unspecified: Secondary | ICD-10-CM | POA: Diagnosis not present

## 2021-01-20 DIAGNOSIS — M48061 Spinal stenosis, lumbar region without neurogenic claudication: Secondary | ICD-10-CM | POA: Diagnosis not present

## 2021-01-20 LAB — HEPATIC FUNCTION PANEL
ALT: 23 U/L (ref 0–35)
AST: 18 U/L (ref 0–37)
Albumin: 4.2 g/dL (ref 3.5–5.2)
Alkaline Phosphatase: 64 U/L (ref 39–117)
Bilirubin, Direct: 0 mg/dL (ref 0.0–0.3)
Total Bilirubin: 0.3 mg/dL (ref 0.2–1.2)
Total Protein: 8 g/dL (ref 6.0–8.3)

## 2021-01-20 LAB — CBC WITH DIFFERENTIAL/PLATELET
Basophils Absolute: 0.1 10*3/uL (ref 0.0–0.1)
Basophils Relative: 0.6 % (ref 0.0–3.0)
Eosinophils Absolute: 0.1 10*3/uL (ref 0.0–0.7)
Eosinophils Relative: 0.6 % (ref 0.0–5.0)
HCT: 41.4 % (ref 36.0–46.0)
Hemoglobin: 13.8 g/dL (ref 12.0–15.0)
Lymphocytes Relative: 26.9 % (ref 12.0–46.0)
Lymphs Abs: 2.9 10*3/uL (ref 0.7–4.0)
MCHC: 33.3 g/dL (ref 30.0–36.0)
MCV: 92.5 fl (ref 78.0–100.0)
Monocytes Absolute: 1 10*3/uL (ref 0.1–1.0)
Monocytes Relative: 9.4 % (ref 3.0–12.0)
Neutro Abs: 6.8 10*3/uL (ref 1.4–7.7)
Neutrophils Relative %: 62.5 % (ref 43.0–77.0)
Platelets: 345 10*3/uL (ref 150.0–400.0)
RBC: 4.48 Mil/uL (ref 3.87–5.11)
RDW: 12.7 % (ref 11.5–15.5)
WBC: 10.9 10*3/uL — ABNORMAL HIGH (ref 4.0–10.5)

## 2021-01-20 LAB — TSH: TSH: 0.16 u[IU]/mL — ABNORMAL LOW (ref 0.35–5.50)

## 2021-01-20 LAB — CORTISOL: Cortisol, Plasma: 14.6 ug/dL

## 2021-01-20 MED ORDER — LEVOTHYROXINE SODIUM 150 MCG PO TABS: 150.0000 ug | ORAL_TABLET | Freq: Every day | ORAL | 0 refills | Status: DC

## 2021-01-20 MED ORDER — NEBIVOLOL HCL 5 MG PO TABS: 5.0000 mg | ORAL_TABLET | Freq: Every day | ORAL | 0 refills | Status: DC

## 2021-01-20 NOTE — Patient Instructions (Signed)

## 2021-01-20 NOTE — Progress Notes (Signed)
Subjective:  Patient ID: Holly Mcmillan, female    DOB: 1971-04-11  Age: 50 y.o. MRN: [id number]  CC: Hypothyroidism and Hypertension  This visit occurred during the SARS-CoV-2 public health emergency.  Safety protocols were in place, including screening questions prior to the visit, additional usage of staff PPE, and extensive cleaning of exam room while observing appropriate contact time as indicated for disinfecting solutions.    HPI Holly Mcmillan presents for f/up -  She continues to feel poorly with a headache on the top of her head.  She has a headache about twice a week.  She tells me that Nurtec has helped some.  She complains of pain in her joints and back.  She also has numbness and tingling in her lower extremities.  She admits to snoring and complains of fatigue and weight gain.  Outpatient Medications Prior to Visit  Medication Sig Dispense Refill   buPROPion (WELLBUTRIN XL) 300 MG 24 hr tablet TAKE 1 TABLET BY MOUTH EVERY DAY 30 tablet 5   DULoxetine (CYMBALTA) 60 MG capsule TAKE 1 CAPSULE BY MOUTH EVERY DAY 30 capsule 5   estradiol (ESTRACE) 2 MG tablet Take 2-4 mg by mouth daily.     fluticasone (FLONASE) 50 MCG/ACT nasal spray SPRAY 2 SPRAYS INTO EACH NOSTRIL EVERY DAY 16 mL 5   KLOR-CON M20 20 MEQ tablet TAKE 1 TABLET BY MOUTH EVERY DAY 30 tablet 1   progesterone (PROMETRIUM) 100 MG capsule Take 100 mg by mouth at bedtime.     Rimegepant Sulfate (NURTEC) 75 MG TBDP Take 1 tablet by mouth every other day. 46 tablet 1   valACYclovir (VALTREX) 1000 MG tablet TAKE 2 TABLETS BY MOUTH 2 TIMES DAILY FOR 2 DOSES. 4 tablet 2   SYNTHROID 200 MCG tablet TAKE 1 TABLET DAILY BEFORE BREAKFAST FOR HYPOTHYROIDISM 90 tablet 0   No facility-administered medications prior to visit.    ROS Review of Systems  Constitutional:  Positive for fatigue and unexpected weight change (wt gain).  HENT:  Negative for sore throat.   Eyes: Negative.  Negative for visual disturbance.  Respiratory:   Negative for cough, chest tightness, shortness of breath and wheezing.   Cardiovascular:  Negative for chest pain, palpitations and leg swelling.  Gastrointestinal:  Negative for abdominal pain, constipation, diarrhea, nausea and vomiting.  Genitourinary: Negative.  Negative for difficulty urinating.  Musculoskeletal:  Positive for arthralgias and back pain. Negative for myalgias and neck pain.  Skin:  Negative for color change.  Neurological:  Positive for numbness and headaches. Negative for dizziness, speech difficulty, weakness and light-headedness.  Hematological:  Negative for adenopathy. Does not bruise/bleed easily.  Psychiatric/Behavioral: Negative.     Objective:  BP (!) 142/88 (BP Location: Left Arm, Patient Position: Sitting, Cuff Size: Large)   Pulse 82   Temp 98.2 F (36.8 C) (Oral)   Ht '5\' 2"'$  (1.575 m)   Wt 210 lb (95.3 kg)   SpO2 95%   BMI 38.41 kg/m   BP Readings from Last 3 Encounters:  01/20/21 (!) 142/88  12/03/20 136/84  07/09/20 133/82    Wt Readings from Last 3 Encounters:  01/20/21 210 lb (95.3 kg)  12/03/20 200 lb (90.7 kg)  06/25/20 195 lb (88.5 kg)    Physical Exam Vitals reviewed.  Constitutional:      Appearance: She is not ill-appearing.  HENT:     Nose: Nose normal.     Mouth/Throat:     Mouth: Mucous membranes are moist.  Eyes:     Conjunctiva/sclera: Conjunctivae normal.  Cardiovascular:     Rate and Rhythm: Normal rate and regular rhythm.     Heart sounds: No murmur heard. Pulmonary:     Effort: Pulmonary effort is normal.     Breath sounds: No stridor. No wheezing, rhonchi or rales.  Abdominal:     General: Abdomen is protuberant. Bowel sounds are normal. There is no distension.     Palpations: Abdomen is soft.     Tenderness: There is no abdominal tenderness.  Musculoskeletal:        General: Normal range of motion.     Cervical back: Neck supple.     Right lower leg: No edema.     Left lower leg: No edema.   Lymphadenopathy:     Cervical: No cervical adenopathy.  Skin:    General: Skin is warm and dry.  Neurological:     General: No focal deficit present.     Mental Status: She is alert.  Psychiatric:        Mood and Affect: Mood normal.        Behavior: Behavior normal.    Lab Results  Component Value Date   WBC 10.9 (H) 01/20/2021   HGB 13.8 01/20/2021   HCT 41.4 01/20/2021   PLT 345.0 01/20/2021   GLUCOSE 78 12/03/2020   CHOL 251 (H) 06/07/2020   TRIG 123.0 06/07/2020   HDL 63.20 06/07/2020   LDLDIRECT 173.0 10/25/2018   LDLCALC 163 (H) 06/07/2020   ALT 23 01/20/2021   AST 18 01/20/2021   NA 139 12/03/2020   K 4.2 12/03/2020   CL 102 12/03/2020   CREATININE 0.78 12/03/2020   BUN 17 12/03/2020   CO2 27 12/03/2020   TSH 0.16 (L) 01/20/2021   HGBA1C 5.5 03/16/2016    MR Brain Wo Contrast  Result Date: 12/16/2020 CLINICAL DATA:  New daily persistent headache G44.52 (ICD-10-CM). Headache, new or worsening (Age >= 50y). Intractable migraine without aura and with status migrainosus G43.011 (ICD-10-CM). Additional history provided: Patient reports headache for years, worsening. EXAM: MRI HEAD WITHOUT CONTRAST TECHNIQUE: Multiplanar, multiecho pulse sequences of the brain and surrounding structures were obtained without intravenous contrast. COMPARISON:  Head CT 04/08/2008. FINDINGS: Brain: Cerebral volume is normal for age. There are a few small scattered foci of T2/FLAIR hyperintensity within the cerebral white matter, the largest measuring 4 mm within the posterior right frontal lobe white matter (series 8, image 21). There is no acute infarct. No evidence of an intracranial mass. No chronic intracranial blood products. No extra-axial fluid collection. No midline shift. Vascular: Maintained flow voids within the proximal large arterial vessels. Skull and upper cervical spine: No focal suspicious marrow lesion. Sinuses/Orbits: Visualized orbits show no acute finding. No significant  paranasal sinus disease. Other: Incidentally noted 9 mm ovoid T2 hyperintense lesion in the region of the right palatine tonsil (series 12, image 20). Impression #4 will be called to the ordering clinician or representative by the Radiologist Assistant, and communication documented in the PACS or [employer]. IMPRESSION: No evidence of acute intracranial abnormality. There are few small scattered T2/FLAIR hyperintense signal changes within the cerebral white matter, the largest measuring 4 mm within the posterior right frontal lobe. These findings are nonspecific, but most often secondary to chronic small vessel ischemia. Sequela of chronic migraine headaches also considered given the provided history. Additional considerations include sequela of a prior infectious/inflammatory process or a demyelinating process (such as multiple sclerosis), among others. Otherwise unremarkable non-contrast  MRI appearance of the brain. Incidentally noted 9 mm ovoid T2 hyperintense lesion in the region of the right palatine tonsil. This may reflect a cyst. However, ENT consultation and direct visualization are recommended to exclude a mass at this site. Electronically Signed   By: Kellie Simmering D.O.   On: 12/16/2020 08:07    Assessment & Plan:   Meleia was seen today for hypothyroidism and hypertension.  Diagnoses and all orders for this visit:  Essential hypertension- Her blood pressure is not adequately well controlled and she has a chronic headache.  I recommended that she start taking nebivolol. -     CBC with Differential/Platelet; Future -     Hepatic function panel; Future -     nebivolol (BYSTOLIC) 5 MG tablet; Take 1 tablet (5 mg total) by mouth daily. -     Cortisol; Future -     Cortisol -     Hepatic function panel -     CBC with Differential/Platelet  Acquired hypothyroidism- Her TSH is suppressed.  This may be contributing to her symptoms.  I recommended that she not take T4 for the next week and  then start at a lower dose. -     Hepatic function panel; Future -     TSH; Future -     TSH -     Hepatic function panel -     levothyroxine (SYNTHROID) 150 MCG tablet; Take 1 tablet (150 mcg total) by mouth daily.  Snoring -     Ambulatory referral to Sleep Studies  Spinal stenosis of lumbar region without neurogenic claudication -     Ambulatory referral to Orthopedic Surgery  I have discontinued Pieper Rolling. Tagliaferro's Synthroid. I am also having her start on nebivolol and levothyroxine. Additionally, I am having her maintain her progesterone, estradiol, buPROPion, fluticasone, Klor-Con M20, valACYclovir, DULoxetine, and Nurtec.  Meds ordered this encounter  Medications   nebivolol (BYSTOLIC) 5 MG tablet    Sig: Take 1 tablet (5 mg total) by mouth daily.    Dispense:  90 tablet    Refill:  0   levothyroxine (SYNTHROID) 150 MCG tablet    Sig: Take 1 tablet (150 mcg total) by mouth daily.    Dispense:  90 tablet    Refill:  0     Follow-up: Return in about 3 months (around 04/21/2021).  Scarlette Calico, MD

## 2021-01-21 ENCOUNTER — Encounter: Payer: Self-pay | Admitting: Internal Medicine

## 2021-01-22 DIAGNOSIS — J300 Vasomotor rhinitis: Secondary | ICD-10-CM | POA: Insufficient documentation

## 2021-01-23 DIAGNOSIS — M48061 Spinal stenosis, lumbar region without neurogenic claudication: Secondary | ICD-10-CM | POA: Insufficient documentation

## 2021-01-24 ENCOUNTER — Other Ambulatory Visit: Payer: Self-pay | Admitting: Internal Medicine

## 2021-01-24 DIAGNOSIS — T502X5A Adverse effect of carbonic-anhydrase inhibitors, benzothiadiazides and other diuretics, initial encounter: Secondary | ICD-10-CM

## 2021-01-24 DIAGNOSIS — I10 Essential (primary) hypertension: Secondary | ICD-10-CM

## 2021-01-24 DIAGNOSIS — E876 Hypokalemia: Secondary | ICD-10-CM

## 2021-02-08 ENCOUNTER — Other Ambulatory Visit: Payer: Self-pay | Admitting: Internal Medicine

## 2021-02-08 DIAGNOSIS — F325 Major depressive disorder, single episode, in full remission: Secondary | ICD-10-CM

## 2021-02-19 ENCOUNTER — Other Ambulatory Visit: Payer: Self-pay

## 2021-02-19 ENCOUNTER — Ambulatory Visit (INDEPENDENT_AMBULATORY_CARE_PROVIDER_SITE_OTHER): Payer: 59 | Admitting: Orthopaedic Surgery

## 2021-02-19 VITALS — BP 143/88 | HR 63

## 2021-02-19 DIAGNOSIS — M48061 Spinal stenosis, lumbar region without neurogenic claudication: Secondary | ICD-10-CM | POA: Diagnosis not present

## 2021-02-19 DIAGNOSIS — Z9889 Other specified postprocedural states: Secondary | ICD-10-CM

## 2021-02-19 NOTE — Progress Notes (Signed)
Office Visit Note   Patient: Holly Mcmillan           Date of Birth: [birth year]           MRN: [id number] Visit Date: 02/19/2021              Requested by: [name], MD [address] [name],  [place] [id number] PCP: [name], MD   Assessment & Plan: Visit Diagnoses:  1. Spinal stenosis of lumbar region without neurogenic claudication   2. History of arthroplasty of right hip     Plan: Patient needs to do abductor strengthening to help with her Trendelenburg gait.  I recommend a single injection on the right at L2-3 epidural to see if this gives her sustained relief.  She may require microdiscectomy and lateral recess decompression on the right at the L2-3 level to get pain relief.  Her altered gait certainly is not helping her back symptoms.  Extensive time with patient we reviewed her hip MRI, bone scan, plain radiographs, lumbar MRI scan today.  I discussed with her she had appropriate surgical care and her hip replacement looks good.  She needs to work on Geologist, engineering I discussed with her by MRI does not appear that the weakness is due to torn tendon although she does have slight inflammation around the tendon.  She will work on abductor strengthening get a single injection and can return for follow-up.  It appears the lumbar is 0 for principal pain that she may require new imaging studies.  Last imaging study in 2021 showed progression lateral recess stenosis and disc protrusion at the L2-3 level on the right.  She does not have any pain on the left side.  We discussed the importance of sorting out her pain generator and getting her better so is she does not require chronic narcotic medication.  No change in medications today.  The epidural injection on the right at L2-3 is both diagnostic and therapeutic which I ordered today.  Follow-Up Instructions: No follow-ups on file.   Orders:  No orders of the defined types were placed in this encounter.  No orders of  the defined types were placed in this encounter.     Procedures: No procedures performed   Clinical Data: No additional findings.   Subjective: Chief Complaint  Patient presents with   Lower Back - Pain    HPI 50 year old female post right total hip arthroplasty 2019 anterior approach by Dr. Rhona Raider with persistent pain.  She has been on pain management with Dr.'s Vira Blanco.  Currently prescribed Norco 5/325 tablets 120 a month.  She has had 1 epidural.  She is here with low back pain radiates into her right hip sometimes numbness down to her toes.  She has noticed weakness problems with ambulation.  She is amatory with a pronounced Trendelenburg limp.  She states after surgery she was doing well but did not get much therapy due to COVID restrictions.  She has had difficulty with ambulation no associated bowel or bladder symptoms.  She had some drainage from the inferior aspect of her hip incision which healed over and resolved.  Patient states she has pain with every step and points to the anterolateral thigh region.  MRI lumbar showed progressive disc degeneration moderately large disc protrusion which increased in height with severe right lateral recess stenosis at the L2-3 level.  Right L3 nerve root impingement.  She also had moderate stenosis at L4-5.  Hip arm  MRI showed no evidence of periprosthetic fluid collection she did have mild tendinosis of the gluteus medius tendon.  Patient pronounced limp since her surgery.  She states she had 1 epidural by Dr.'s Vira Blanco.   Review of Systems positive smoker small positive for morbid obesity restless legs.  Lumbar disc protrusion and lumbar spinal stenosis.  Previous hip arthroplasty with a Trendelenburg gait since 2019.  All systems noncontributory HPI.   Objective: Vital Signs: BP (!) 143/88   Pulse 63   Physical Exam Constitutional:      Appearance: She is well-developed.  HENT:     Head: Normocephalic.     Right Ear: External ear  normal.     Left Ear: External ear normal. There is no impacted cerumen.  Eyes:     Pupils: Pupils are equal, round, and reactive to light.  Neck:     Thyroid: No thyromegaly.     Trachea: No tracheal deviation.  Cardiovascular:     Rate and Rhythm: Normal rate.  Pulmonary:     Effort: Pulmonary effort is normal.  Abdominal:     Palpations: Abdomen is soft.  Musculoskeletal:     Cervical back: No rigidity.  Skin:    General: Skin is warm and dry.  Neurological:     Mental Status: She is alert and oriented to person, place, and time.  Psychiatric:        Behavior: Behavior normal.    Ortho Exam hip incisions well-healed.  Inferior aspect scar slightly widened where she had some serous drainage postop short..  Significant tenderness over the trochanteric bursa.  Positive Trendelenburg gait.  She is unable to do a side lateral leg raise on the right side she can pop off 40 or 50 on the left with no problems.  Some tenderness to the lumbosacral junction.  Specialty Comments:  No specialty comments available.  Imaging: CLINICAL DATA:  Lumbar radiculopathy. Right hip and leg pain for 3 months.   EXAM: MRI LUMBAR SPINE WITHOUT CONTRAST   TECHNIQUE: Multiplanar, multisequence MR imaging of the lumbar spine was performed. No intravenous contrast was administered.   COMPARISON:  07/18/2017   FINDINGS: Segmentation:  Standard.   Alignment:  Moderate lumbar levoscoliosis.  No listhesis.   Vertebrae: No fracture, suspicious osseous lesion, or significant marrow edema. Chronic asymmetric right-sided degenerative endplate changes at Z6-1.   Conus medullaris and cauda equina: Conus extends to the L1 level. Conus and cauda equina appear normal.   Paraspinal and other soft tissues: 4 mm T2 hyperintense focus in the left kidney, likely a cyst.   Disc levels:   Disc desiccation from L1-2 to L4-5 with chronic severe right-sided disc space narrowing at L2-3 and mild narrowing at  L3-4 and L4-5.   T12-L1 and L1-2: Mild facet hypertrophy without disc herniation or stenosis.   L2-3: Disc bulging eccentric to the right, a moderately large right subarticular disc protrusion, mild facet and ligamentum flavum hypertrophy, and severe right-sided disc space height loss result in mild spinal stenosis, severe right lateral recess stenosis, and moderate right neural foraminal stenosis. The disc protrusion has enlarged with greatly increased right lateral recess stenosis and right L3 nerve root impingement.   L3-4: Disc bulging eccentric to the left and moderate facet and ligamentum flavum hypertrophy result in mild right and mild-to-moderate left neural foraminal stenosis, slightly progressed. No spinal stenosis.   L4-5: Progressive disc bulging, increased size of a left paracentral and left subarticular disc protrusion, a new right foraminal disc protrusion,  and moderate left greater than right facet and ligamentum flavum hypertrophy result in moderate spinal stenosis, moderate right and severe left lateral recess stenosis, and moderate bilateral neural foraminal stenosis. Potential neural impingement bilaterally, particularly of the left L5 nerve root.   L5-S1: New minimal disc bulging and mild to moderate facet hypertrophy without stenosis.   IMPRESSION: 1. Progressive lumbar disc degeneration, most notable at L2-3 where a moderately large disc protrusion results in severe right lateral recess stenosis and right L3 nerve root impingement. 2. Moderate spinal stenosis, severe left lateral recess stenosis, and moderate bilateral neural foraminal stenosis at L4-5.     Electronically Signed   By: Logan Bores M.D.   On: 03/17/2020 18:36  CLINICAL DATA:  Radicular right hip pain for 3 months. History of right total hip arthroplasty   EXAM: MR OF THE RIGHT HIP WITHOUT CONTRAST   TECHNIQUE: Multiplanar, multisequence MR imaging was performed. No  intravenous contrast was administered.   COMPARISON:  X-ray 02/01/2020   FINDINGS: Bones: Status post right total hip arthroplasty. Arthroplasty components are in their expected alignment without dislocation. No periprosthetic fracture or marrow edema. Bone marrow signal within the visualized pelvis and proximal left femur are within normal limits. No suspicious marrow replacing lesion.   Joint or bursal effusion   Joint effusion: No joint effusion or periprosthetic fluid collection.   Bursae: No abnormal bursal fluid collection.   Muscles and tendons   Muscles and tendons: Mild tendinosis of the right gluteus medius tendon with associated peritendinous edema. The hamstring, iliopsoas, rectus femoris, and adductor tendons appear intact without tear or significant tendinosis. Normal muscle bulk and signal intensity without edema, atrophy, or fatty infiltration.   Other findings   Miscellaneous: Linear scarring with scattered susceptibility at the lateral aspect of the right hip compatible with prior postsurgical change. No soft tissue fluid collection. Multiple nabothian cysts at the level of the cervix. Degenerative facet arthropathy of the visualized lower lumbar spine, better characterized on dedicated same-day lumbar spine MRI.   IMPRESSION: 1. Status post right total hip arthroplasty. No evidence of hardware complication. No periprosthetic fluid collection. 2. Mild tendinosis of the right gluteus medius tendon with associated peritendinous edema.     Electronically Signed   By: Davina Poke D.O.   On: 03/18/2020 11:52     PMFS History: Patient Active Problem List   Diagnosis Date Noted   Spinal stenosis of lumbar region without neurogenic claudication 01/23/2021   Snoring 01/20/2021   Abnormal MRI of head 12/16/2020   New daily persistent headache 12/03/2020   Intractable migraine without aura and with status migrainosus 12/03/2020   Encounter for  general adult medical examination with abnormal findings 06/07/2020   Polyp of colon 06/07/2020   Diuretic-induced hypokalemia 06/07/2020   History of arthroplasty of right hip 02/01/2020   Primary osteoarthritis involving multiple joints 08/02/2019   Rotator cuff disorder, left 04/17/2019   Irritable bowel syndrome with diarrhea 01/10/2019   Lumbar radiculopathy, right 07/06/2017   Eczema of face 05/26/2017   Routine general medical examination at a health care facility 03/16/2016   RLS (restless legs syndrome) 03/16/2016   Hypertriglyceridemia, essential 04/04/2015   Hyperlipidemia with target LDL less than 100 04/04/2015   Severe obesity (BMI >= 40) ([place]) 09/20/2013   Hypothyroidism 07/09/2010   Allergic rhinitis due to pollen 06/30/2010   TOBACCO USE 11/28/2009   Depression, major, in remission ([place]) 11/28/2009   Essential hypertension 11/28/2009   Past Medical History:  Diagnosis Date  Arthritis    Depression    Dysmenorrhea    Gallbladder disease    Headache(784.0)    Hyperlipidemia    Hypertension    Hypothyroidism    Obesity    Ovarian cyst     Family History  Problem Relation Age of Onset   Alcohol abuse Paternal Uncle        x2   Arthritis Father    Hypertension Father    Cancer Other    Heart disease Mother        CHF, A Fib   Hypertension Mother    Cervical cancer Mother    Leukemia Mother    Alcohol abuse Maternal Uncle        x2   Heart disease Maternal Grandfather    Pancreatic cancer Maternal Aunt    Lung cancer Maternal Uncle    Colon cancer Cousin    Colon polyps Neg Hx    Esophageal cancer Neg Hx    Rectal cancer Neg Hx    Stomach cancer Neg Hx     Past Surgical History:  Procedure Laterality Date   ablation     BREAST CYST EXCISION Right 06/24/2016   Procedure: EXCISION RIGHT BREAST CYST;  Surgeon: Coralie Keens, MD;  Location: [facility];  Service: General;  Laterality: Right;   CESAREAN SECTION  2002, 2009    CHOLECYSTECTOMY  2005   NASAL SINUS SURGERY     THYROID SURGERY  2012   thyroidectomy   Social History   Occupational History   Occupation: Art therapist: Civil engineer, contracting  Tobacco Use   Smoking status: Former    Packs/day: 0.30    Years: 20.00    Pack years: 6.00    Types: Cigarettes   Smokeless tobacco: Never  Substance and Sexual Activity   Alcohol use: No   Drug use: No   Sexual activity: Not Currently

## 2021-02-26 ENCOUNTER — Encounter: Payer: Self-pay | Admitting: Orthopaedic Surgery

## 2021-03-03 ENCOUNTER — Telehealth: Payer: Self-pay | Admitting: Neurology

## 2021-03-03 ENCOUNTER — Ambulatory Visit (INDEPENDENT_AMBULATORY_CARE_PROVIDER_SITE_OTHER): Payer: 59 | Admitting: Neurology

## 2021-03-03 ENCOUNTER — Encounter: Payer: Self-pay | Admitting: Neurology

## 2021-03-03 VITALS — BP 139/84 | HR 65 | Wt 216.0 lb

## 2021-03-03 DIAGNOSIS — R0683 Snoring: Secondary | ICD-10-CM | POA: Insufficient documentation

## 2021-03-03 DIAGNOSIS — G4440 Drug-induced headache, not elsewhere classified, not intractable: Secondary | ICD-10-CM

## 2021-03-03 DIAGNOSIS — G43709 Chronic migraine without aura, not intractable, without status migrainosus: Secondary | ICD-10-CM | POA: Diagnosis not present

## 2021-03-03 MED ORDER — ONDANSETRON 4 MG PO TBDP: 4.0000 mg | ORAL_TABLET | Freq: Three times a day (TID) | ORAL | 6 refills | Status: DC | PRN

## 2021-03-03 MED ORDER — TOPIRAMATE 100 MG PO TABS: 100.0000 mg | ORAL_TABLET | Freq: Two times a day (BID) | ORAL | 6 refills | Status: DC

## 2021-03-03 MED ORDER — TIZANIDINE HCL 4 MG PO TABS: 4.0000 mg | ORAL_TABLET | Freq: Four times a day (QID) | ORAL | 6 refills | Status: DC | PRN

## 2021-03-03 MED ORDER — SUMATRIPTAN SUCCINATE 50 MG PO TABS
50.0000 mg | ORAL_TABLET | ORAL | 6 refills | Status: DC | PRN
Start: 2021-03-03 — End: 2022-04-15

## 2021-03-03 NOTE — Telephone Encounter (Signed)
I put her on the wait list for both Dr. Rexene Alberts and Dr. Brett Fairy if that is okay.

## 2021-03-03 NOTE — Patient Instructions (Signed)
Meds ordered this encounter   topiramate (TOPAMAX) 100 MG tablet--as Headache preventive medication    Sig: Take 1 tablet (100 mg total) by mouth 2 (two) times daily.    Dispense:  60 tablet    Refill:  6   SUMAtriptan (IMITREX) 50 MG tablet    Sig: Take 1 tablet (50 mg total) by mouth every 2 (two) hours as needed for migraine. May repeat in 2 hours if headache persists or recurs.    Dispense:  12 tablet    Refill:  6   ondansetron (ZOFRAN ODT) 4 MG disintegrating tablet    Sig: Take 1 tablet (4 mg total) by mouth every 8 (eight) hours as needed for nausea or vomiting.    Dispense:  20 tablet    Refill:  6   tiZANidine (ZANAFLEX) 4 MG tablet    Sig: Take 1 tablet (4 mg total) by mouth every 6 (six) hours as needed for muscle spasms.    Dispense:  30 tablet    Refill:  6     For prolonged severe migraine headache, you may mix Imitrex, Zofran for nausea, tizanidine as needed for muscle spasm, Aleve as needed  Stop daily BC powder use

## 2021-03-03 NOTE — Progress Notes (Signed)
Chief Complaint  Patient presents with   New Patient (Initial Visit)    Rm 16, alone, here to discuss MRI, reports 4 headaches a week       ASSESSMENT AND PLAN  Holly Mcmillan is a 50 y.o. female   Frequent morning headaches  Likely a combination of migraine background, with obstructive sleep apnea, analgesic medicine rebound headache  MRI of the brain only showed mild small vessel disease, most consistent with her history of migraine  Topamax 100 mg twice a day as preventive medication,  Imitrex 50 mg as needed, may combine with Zofran, tizanidine, Aleve as needed for prolonged severe headaches, High risk for obstructive sleep apnea  On schedule for sleep consultation for potential obstructive sleep apnea    DIAGNOSTIC DATA (LABS, IMAGING, TESTING) - I reviewed patient records, labs, notes, testing and imaging myself where available.  Laboratory evaluation in September 2022, normal liver functional test, cortisol level, BMP magnesium, ESR, CBC showed mild elevated WBC of 10.9, hemoglobin of 13.8, TSH was decreased 0.16,  MEDICAL HISTORY:  Holly Mcmillan, is a 50 year old female, seen in request by her primary care doctor Scarlette Calico for evaluation of frequent headaches, initial evaluation was March 03, 2021.  I reviewed and summarized the referring note. PMhx HTN Hypothyroidism Depression  She had long history of chronic headache, initially only happening intermittently, holoacranial pressure occasionally pounding headache with light noise sensitivity, over the years, she has get into the habit of taking almost daily BC powder, 3 pouches each day, since July 2022, she reported worsening headaches, over the last year, she had about 20 pounds weight gain, she often wake up early morning, complains of moderate to severe pressure headache, occasionally with light noise sensitivity, she has to take 3 BC powder at once, often have to have second dose later  Her headache gradually  ease up as day goes by,  she has never tried triptan treatment,  In addition, she complains of extreme fatigue over the past few months, sleepy and tired, often have to take a nap right after work, her husband reported that she has snoring, she has frequent awakening at nighttime, with morning headaches, today's examination showed narrow oropharyngeal space, at high risk for obstructive sleep apnea, is on schedule for sleep consultation  PHYSICAL EXAM:   Vitals:   03/03/21 0756  BP: 139/84  Pulse: 65  Weight: 216 lb (98 kg)   Not recorded     Body mass index is 39.51 kg/m.  PHYSICAL EXAMNIATION:  Gen: NAD, conversant, well nourised, well groomed                     Cardiovascular: Regular rate rhythm, no peripheral edema, warm, nontender. Eyes: Conjunctivae clear without exudates or hemorrhage Neck: Supple, no carotid bruits. Pulmonary: Clear to auscultation bilaterally   NEUROLOGICAL EXAM:  MENTAL STATUS: Speech:    Speech is normal; fluent and spontaneous with normal comprehension.  Cognition:     Orientation to time, place and person     Normal recent and remote memory     Normal Attention span and concentration     Normal Language, naming, repeating,spontaneous speech     Fund of knowledge   CRANIAL NERVES: CN II: Visual fields are full to confrontation. Pupils are round equal and briskly reactive to light. CN III, IV, VI: extraocular movement are normal. No ptosis. CN V: Facial sensation is intact to light touch CN VII: Face is symmetric with normal eye  closure  CN VIII: Hearing is normal to causal conversation. CN IX, X: Phonation is normal.  Draped soft palate, narrow oropharyngeal space, CN XI: Head turning and shoulder shrug are intact CN XII: Tongue has normal strength,  MOTOR: There is no pronator drift of out-stretched arms. Muscle bulk and tone are normal. Muscle strength is normal.  REFLEXES: Reflexes are 2+ and symmetric at the biceps, triceps,  knees, and ankles. Plantar responses are flexor.  SENSORY: Intact to light touch, pinprick and vibratory sensation are intact in fingers and toes.  COORDINATION: There is no trunk or limb dysmetria noted.  GAIT/STANCE: Need push-up to get up from seated position, mild antalgic due to right hip pain  REVIEW OF SYSTEMS:  Full 14 system review of systems performed and notable only for as above All other review of systems were negative.   ALLERGIES: Allergies  Allergen Reactions   Spironolactone     Hair loss   Sulfonamide Derivatives Hives    HOME MEDICATIONS: Current Outpatient Medications  Medication Sig Dispense Refill   buPROPion (WELLBUTRIN XL) 300 MG 24 hr tablet TAKE 1 TABLET BY MOUTH EVERY DAY 30 tablet 5   DULoxetine (CYMBALTA) 60 MG capsule TAKE 1 CAPSULE BY MOUTH EVERY DAY 30 capsule 5   estradiol (ESTRACE) 2 MG tablet Take 2-4 mg by mouth daily.     fluticasone (FLONASE) 50 MCG/ACT nasal spray SPRAY 2 SPRAYS INTO EACH NOSTRIL EVERY DAY 16 mL 5   KLOR-CON M20 20 MEQ tablet TAKE 1 TABLET BY MOUTH EVERY DAY 90 tablet 1   levothyroxine (SYNTHROID) 150 MCG tablet Take 1 tablet (150 mcg total) by mouth daily. 90 tablet 0   nebivolol (BYSTOLIC) 5 MG tablet Take 1 tablet (5 mg total) by mouth daily. 90 tablet 0   progesterone (PROMETRIUM) 100 MG capsule Take 100 mg by mouth at bedtime.     valACYclovir (VALTREX) 1000 MG tablet TAKE 2 TABLETS BY MOUTH 2 TIMES DAILY FOR 2 DOSES. 4 tablet 2   No current facility-administered medications for this visit.    PAST MEDICAL HISTORY: Past Medical History:  Diagnosis Date   Arthritis    Depression    Dysmenorrhea    Gallbladder disease    Headache(784.0)    Hyperlipidemia    Hypertension    Hypothyroidism    Obesity    Ovarian cyst     PAST SURGICAL HISTORY: Past Surgical History:  Procedure Laterality Date   ablation     BREAST CYST EXCISION Right 06/24/2016   Procedure: EXCISION RIGHT BREAST CYST;  Surgeon: Coralie Keens, MD;  Location: [facility];  Service: General;  Laterality: Right;   CESAREAN SECTION  2002, 2009   CHOLECYSTECTOMY  2005   NASAL SINUS SURGERY     THYROID SURGERY  2012   thyroidectomy    FAMILY HISTORY: Family History  Problem Relation Age of Onset   Alcohol abuse Paternal Uncle        x2   Arthritis Father    Hypertension Father    Cancer Other    Heart disease Mother        CHF, A Fib   Hypertension Mother    Cervical cancer Mother    Leukemia Mother    Alcohol abuse Maternal Uncle        x2   Heart disease Maternal Grandfather    Pancreatic cancer Maternal Aunt    Lung cancer Maternal Uncle    Colon cancer Cousin    Colon  polyps Neg Hx    Esophageal cancer Neg Hx    Rectal cancer Neg Hx    Stomach cancer Neg Hx     SOCIAL HISTORY: Social History   Socioeconomic History   Marital status: Married    Spouse name: Not on file   Number of children: 2   Years of education: Not on file   Highest education level: Not on file  Occupational History   Occupation: [employer]    Employer: Civil engineer, contracting  Tobacco Use   Smoking status: Former    Packs/day: 0.30    Years: 20.00    Pack years: 6.00    Types: Cigarettes   Smokeless tobacco: Never  Substance and Sexual Activity   Alcohol use: No   Drug use: No   Sexual activity: Not Currently  Other Topics Concern   Not on file  Social History Narrative   Not on file   Social Determinants of Health   Financial Resource Strain: Not on file  Food Insecurity: Not on file  Transportation Needs: Not on file  Physical Activity: Not on file  Stress: Not on file  Social Connections: Not on file  Intimate Partner Violence: Not on file      Marcial Pacas, M.D. Ph.D.  [facility] [address] [place], [place] [address] Ph: [phone] Fax: [phone]  CC:  [name], MD [place],  [place] [address]  [name], MD

## 2021-03-03 NOTE — Telephone Encounter (Signed)
I see patients today for frequent morning headache, she is at high risk for obstructive sleep apnea, is on schedule to see Dr. Orpah Melter April 14, 2021, her insurance going to start over on January 2023,  If possible, she would really appreciate if we can see her early and have sleep study before the end of the year

## 2021-03-05 NOTE — Telephone Encounter (Signed)
I spoke to her over the phone. Patients can feel bad for a few days after the shingles vaccination. However, she just started topiramate 100mg  BID. She is complaining of altered taste with soda, mild confusion/slow processing and dizziness. These symptoms are likely related to the oral medication. I instructed her to back the dose off and slowly titrate, as tolerated, over the next couple of weeks. This should help decrease side effects. She is willing to try this plan and will reach back out to our office if things do not improve.

## 2021-03-10 ENCOUNTER — Ambulatory Visit: Payer: 59 | Admitting: Neurology

## 2021-03-17 ENCOUNTER — Ambulatory Visit (INDEPENDENT_AMBULATORY_CARE_PROVIDER_SITE_OTHER): Payer: 59 | Admitting: Physical Medicine and Rehabilitation

## 2021-03-17 ENCOUNTER — Other Ambulatory Visit: Payer: Self-pay

## 2021-03-17 ENCOUNTER — Other Ambulatory Visit: Payer: Self-pay | Admitting: Internal Medicine

## 2021-03-17 ENCOUNTER — Encounter: Payer: Self-pay | Admitting: Physical Medicine and Rehabilitation

## 2021-03-17 ENCOUNTER — Ambulatory Visit: Payer: Self-pay

## 2021-03-17 VITALS — BP 135/75 | HR 66

## 2021-03-17 DIAGNOSIS — M159 Polyosteoarthritis, unspecified: Secondary | ICD-10-CM

## 2021-03-17 DIAGNOSIS — F325 Major depressive disorder, single episode, in full remission: Secondary | ICD-10-CM

## 2021-03-17 DIAGNOSIS — M5416 Radiculopathy, lumbar region: Secondary | ICD-10-CM

## 2021-03-17 MED ORDER — METHYLPREDNISOLONE ACETATE 80 MG/ML IJ SUSP
80.0000 mg | Freq: Once | INTRAMUSCULAR | Status: AC
Start: 2021-03-17 — End: 2021-03-17
  Administered 2021-03-17: 10:00:00 80 mg

## 2021-03-17 NOTE — Patient Instructions (Signed)

## 2021-03-17 NOTE — Progress Notes (Signed)
Pt state lower back pain that travels down her right leg. Pt state walking and climbing stairs makes the pain worse. Pt state she takes pain makes and uses ice to help ease her pain.  Numeric Pain Rating Scale and Functional Assessment Average Pain 3   In the last MONTH (on 0-10 scale) has pain interfered with the following?  1. General activity like being  able to carry out your everyday physical activities such as walking, climbing stairs, carrying groceries, or moving a chair?  Rating(8)   +Driver, -BT, -Dye Allergies.

## 2021-03-18 ENCOUNTER — Ambulatory Visit (INDEPENDENT_AMBULATORY_CARE_PROVIDER_SITE_OTHER): Payer: 59 | Admitting: Neurology

## 2021-03-18 ENCOUNTER — Encounter: Payer: Self-pay | Admitting: Neurology

## 2021-03-18 VITALS — BP 135/86 | HR 66 | Ht 62.0 in | Wt 215.6 lb

## 2021-03-18 DIAGNOSIS — R0683 Snoring: Secondary | ICD-10-CM | POA: Diagnosis not present

## 2021-03-18 DIAGNOSIS — E669 Obesity, unspecified: Secondary | ICD-10-CM

## 2021-03-18 DIAGNOSIS — R519 Headache, unspecified: Secondary | ICD-10-CM

## 2021-03-18 DIAGNOSIS — Z820 Family history of epilepsy and other diseases of the nervous system: Secondary | ICD-10-CM

## 2021-03-18 DIAGNOSIS — G4719 Other hypersomnia: Secondary | ICD-10-CM

## 2021-03-18 NOTE — Progress Notes (Signed)
Subjective:    Patient ID: [name] is a 50 y.o. female.  HPI    Holly Age, MD, PhD [facility] [address] [place], [place] [address]  Dear Dr. Ronnald Ramp,  I saw your patient, Holly Mcmillan, upon your kind request in my sleep clinic today for initial consultation of her sleep disorder, in particular, concern for underlying obstructive sleep apnea.  The patient is unaccompanied today.  As you know, Holly Mcmillan is a 51 year old right-handed woman with an underlying medical history of arthritis, headaches, hypertension, hyperlipidemia, hypothyroidism, ovarian cyst, depression, and obesity, who reports snoring and excessive daytime somnolence.  She has recently started seeing Dr. Krista Blue in this office for her headaches. She was felt to have probably a combination type headache including migraine, headaches from untreated obstructive sleep apnea, and medication rebound headaches.  She had a brain MRI that showed small vessel disease.  The patient has been on Topamax for headache prevention and Imitrex as needed as well as Zofran and tizanidine.  I reviewed your office note from 01/20/2021.  Her Epworth sleepiness score is 16/24, fatigue severity score is 16 out of 63.  She reports a family history of sleep apnea affecting her mom. She reports increasing daytime somnolence in the past few years.  She has had weight fluctuations up to 30 pounds up or down.  Her headaches are little better, she has been on low-dose topiramate, 50 mg at bedtime.  She does not currently drink any caffeine especially after starting the topiramate, soda tasted not as good any longer although she does crave it sometimes.  She quit smoking some 10 years ago, she drinks alcohol rarely.  She tries to be in bed between 10 and 11 and rise time is around 6.  She works for a [employer], 3 days from home and 2 days of office.  She does not have a long commute that has had some sleepiness at  the wheel, never dozed off.  She never used it and now feels like she could manage even daily.  She does recall being sleepy as a child even.  She has not frequent morning headaches but lately, probably after starting her blood pressure medication.  The Topamax her headaches are not as severe or frequent.  She had a change in her blood pressure medication as she had low potassium.  She is normal on Bystolic.  She still takes potassium supplements.  She has been on Cymbalta and Wellbutrin.  She has been on thyroid medication, she had a total thyroidectomy under Dr. Redmond Baseman.  She lives with her husband and her younger daughter, her older daughter is in college.  They have 1 dog and 1 hamster in the household, neither 1 sleeps in her bedroom.  She does have a TV in her bedroom but does not watch at night.  Her Past Medical History Is Significant For: Past Medical History:  Diagnosis Date   Arthritis    Depression    Dysmenorrhea    Gallbladder disease    Headache(784.0)    Hyperlipidemia    Hypertension    Hypothyroidism    Obesity    Ovarian cyst     Her Past Surgical History Is Significant For: Past Surgical History:  Procedure Laterality Date   ablation     BREAST CYST EXCISION Right 06/24/2016   Procedure: EXCISION RIGHT BREAST CYST;  Surgeon: Coralie Keens, MD;  Location: [facility];  Service: General;  Laterality: Right;   CESAREAN SECTION  2002, 2009   CHOLECYSTECTOMY  2005   NASAL SINUS SURGERY     THYROID SURGERY  2012   thyroidectomy    Her Family History Is Significant For: Family History  Problem Relation Mcmillan of Onset   Heart disease Mother        CHF, A Fib   Hypertension Mother    Cervical cancer Mother    Leukemia Mother    Arthritis Father    Hypertension Father    Pancreatic cancer Maternal Aunt    Alcohol abuse Maternal Uncle        x2   Lung cancer Maternal Uncle    Alcohol abuse Paternal Uncle        x2   Heart disease Maternal Grandfather     Colon cancer Cousin    Cancer Other    Colon polyps Neg Hx    Esophageal cancer Neg Hx    Rectal cancer Neg Hx    Stomach cancer Neg Hx    Sleep apnea Neg Hx     Her Social History Is Significant For: Social History   Socioeconomic History   Marital status: Married    Spouse name: Not on file   Number of children: 2   Years of education: Not on file   Highest education level: Not on file  Occupational History   Occupation: Art therapist: Civil engineer, contracting  Tobacco Use   Smoking status: Former    Packs/day: 0.30    Years: 20.00    Pack years: 6.00    Types: Cigarettes   Smokeless tobacco: Never  Vaping Use   Vaping Use: Never used  Substance and Sexual Activity   Alcohol use: No   Drug use: No   Sexual activity: Not Currently  Other Topics Concern   Not on file  Social History Narrative   Not on file   Social Determinants of Health   Financial Resource Strain: Not on file  Food Insecurity: Not on file  Transportation Needs: Not on file  Physical Activity: Not on file  Stress: Not on file  Social Connections: Not on file    Her Allergies Are:  Allergies  Allergen Reactions   Spironolactone     Hair loss   Sulfonamide Derivatives Hives  :   Her Current Medications Are:  Outpatient Encounter Medications as of 03/18/2021  Medication Sig   buPROPion (WELLBUTRIN XL) 300 MG 24 hr tablet TAKE 1 TABLET BY MOUTH EVERY DAY   DULoxetine (CYMBALTA) 60 MG capsule TAKE 1 CAPSULE BY MOUTH EVERY DAY   estradiol (ESTRACE) 2 MG tablet Take 2-4 mg by mouth daily.   fluticasone (FLONASE) 50 MCG/ACT nasal spray SPRAY 2 SPRAYS INTO EACH NOSTRIL EVERY DAY   KLOR-CON M20 20 MEQ tablet TAKE 1 TABLET BY MOUTH EVERY DAY   levothyroxine (SYNTHROID) 150 MCG tablet Take 1 tablet (150 mcg total) by mouth daily.   nebivolol (BYSTOLIC) 5 MG tablet Take 1 tablet (5 mg total) by mouth daily.   ondansetron (ZOFRAN ODT) 4 MG disintegrating tablet Take 1 tablet (4 mg total) by  mouth every 8 (eight) hours as needed for nausea or vomiting.   progesterone (PROMETRIUM) 100 MG capsule Take 100 mg by mouth at bedtime.   SUMAtriptan (IMITREX) 50 MG tablet Take 1 tablet (50 mg total) by mouth every 2 (two) hours as needed for migraine. May repeat in 2 hours if headache persists or recurs.   tiZANidine (  ZANAFLEX) 4 MG tablet Take 1 tablet (4 mg total) by mouth every 6 (six) hours as needed for muscle spasms.   topiramate (TOPAMAX) 100 MG tablet Take 1 tablet (100 mg total) by mouth 2 (two) times daily.   valACYclovir (VALTREX) 1000 MG tablet TAKE 2 TABLETS BY MOUTH 2 TIMES DAILY FOR 2 DOSES.   Facility-Administered Encounter Medications as of 03/18/2021  Medication   methylPREDNISolone acetate (DEPO-MEDROL) injection 80 mg  :   Review of Systems:  Out of a complete 14 point review of systems, all are reviewed and negative with the exception of these symptoms as listed below:   Review of Systems  Neurological:        Pt is here for sleep consult . Pt states she snores, have headaches and is fatigue throughout the day. Pt states she does have  hypertension . Pt denies sleep study and CPAP at home .  ESS:16 FSS:60   Objective:  Neurological Exam  Physical Exam Physical Examination:   Vitals:   03/18/21 0908  BP: 135/86  Pulse: 66    General Examination: The patient is a very pleasant 50 y.o. female in no acute distress. She appears well-developed and well-nourished and well groomed.   HEENT: Normocephalic, atraumatic, pupils are equal, round and reactive to light, extraocular tracking is good without limitation to gaze excursion or nystagmus noted. Hearing is grossly intact. Face is symmetric with normal facial animation. Speech is clear with no dysarthria noted. There is no hypophonia. There is no lip, neck/head, jaw or voice tremor. Neck is supple with full range of passive and active motion. There are no carotid bruits on auscultation. Oropharynx exam reveals:  mild mouth dryness, adequate dental hygiene and mild airway crowding, due to redundant soft palate, tonsils on the smaller side, tongue protrudes centrally and palate elevates symmetrically, Mallampati class I, slightly prominent uvula.  Neck circumference of 14-1/2 inches.  She has a minimal to mild overbite.  Chest: Clear to auscultation without wheezing, rhonchi or crackles noted.  Heart: S1+S2+0, regular and normal without murmurs, rubs or gallops noted.   Abdomen: Soft, non-tender and non-distended.  Extremities: There is no pitting edema in the distal lower extremities bilaterally.   Skin: Warm and dry without trophic changes noted.   Musculoskeletal: exam reveals no obvious joint deformities, tenderness or joint swelling or erythema.   Neurologically:  Mental status: The patient is awake, alert and oriented in all 4 spheres. Her immediate and remote memory, attention, language skills and fund of knowledge are appropriate. There is no evidence of aphasia, agnosia, apraxia or anomia. Speech is clear with normal prosody and enunciation. Thought process is linear. Mood is normal and affect is normal.  Cranial nerves II - XII are as described above under HEENT exam.  Motor exam: Normal bulk, strength and tone is noted. There is no tremor, fine motor skills and coordination: grossly intact.  Cerebellar testing: No dysmetria or intention tremor. There is no truncal or gait ataxia.  Sensory exam: intact to light touch in the upper and lower extremities.  Gait, station and balance: She stands easily. No veering to one side is noted. No leaning to one side is noted. Posture is Mcmillan-appropriate and stance is narrow based. Gait shows normal stride length and normal pace. No problems turning are noted.   Assessment and Plan:  In summary, Holly Mcmillan is a very pleasant 50 y.o.-year old female with an underlying medical history of arthritis, headaches, hypertension, hyperlipidemia, hypothyroidism,  ovarian  cyst, depression, and obesity, whose history and physical exam are concerning for obstructive sleep apnea (OSA). I had a long chat with the patient about my findings and the diagnosis of OSA, its prognosis and treatment options. We talked about medical treatments, surgical interventions and non-pharmacological approaches. I explained in particular the risks and ramifications of untreated moderate to severe OSA, especially with respect to developing cardiovascular disease down the Road, including congestive heart failure, difficult to treat hypertension, cardiac arrhythmias, or stroke. Even type 2 diabetes has, in part, been linked to untreated OSA. Symptoms of untreated OSA include daytime sleepiness, memory problems, mood irritability and mood disorder such as depression and anxiety, lack of energy, as well as recurrent headaches, especially morning headaches. We talked about trying to maintain a healthy lifestyle in general, as well as the importance of weight control. We also talked about the importance of good sleep hygiene. I recommended the following at this time: sleep study.  I outlined the differences between a laboratory attended sleep study versus home sleep test. I explained the sleep test procedure to the patient and also outlined possible surgical and non-surgical treatment options of OSA, including the use of a custom-made dental device (which would require a referral to a specialist dentist or oral surgeon), upper airway surgical options, such as traditional UPPP or a novel less invasive surgical option in the form of Inspire hypoglossal nerve stimulation (which would involve a referral to an ENT surgeon). I also explained the CPAP treatment option to the patient, who indicated that she would be willing to try CPAP or AutoPap if the need arises. I explained the importance of being compliant with PAP treatment, not only for insurance purposes but primarily to improve Her symptoms, and for  the patient's long term health benefit, including to reduce Her cardiovascular risks.  We will pick up our discussion after testing.  We will keep her posted as to her test results by phone call and plan a follow-up in sleep clinic accordingly.  I answered all her questions today and she was in agreement. Thank you very much for allowing me to participate in the care of this nice patient. If I can be of any further assistance to you please do not hesitate to call me at [phone].  Sincerely,   Holly Age, MD, PhD

## 2021-03-18 NOTE — Patient Instructions (Signed)
It was nice to meet you today. Based on your symptoms and your exam I believe you are at risk for obstructive sleep apnea (aka OSA), and I think we should proceed with a sleep study to determine whether you do or do not have OSA and how severe it is. Even, if you have mild OSA, I may want you to consider treatment with CPAP, as treatment of even borderline or mild sleep apnea can result and improvement of symptoms such as sleep disruption, daytime sleepiness, nighttime bathroom breaks, restless leg symptoms, improvement of headache syndromes, even improved mood disorder.   As explained, an attended sleep study meaning you get to stay overnight in the sleep lab, lets us monitor sleep-related behaviors such as sleep talking and leg movements in sleep, in addition to monitoring for sleep apnea.  A home sleep test is a screening tool for sleep apnea only, and unfortunately does not help with any other sleep-related diagnoses.  Please remember, the long-term risks and ramifications of untreated moderate to severe obstructive sleep apnea are: increased Cardiovascular disease, including congestive heart failure, stroke, difficult to control hypertension, treatment resistant obesity, arrhythmias, especially irregular heartbeat commonly known as A. Fib. (atrial fibrillation); even type 2 diabetes has been linked to untreated OSA.   Sleep apnea can cause disruption of sleep and sleep deprivation in most cases, which, in turn, can cause recurrent headaches, problems with memory, mood, concentration, focus, and vigilance. Most people with untreated sleep apnea report excessive daytime sleepiness, which can affect their ability to drive. Please do not drive if you feel sleepy. Patients with sleep apnea can also develop difficulty initiating and maintaining sleep (aka insomnia).   Having sleep apnea may increase your risk for other sleep disorders, including involuntary behaviors sleep such as sleep terrors, sleep  talking, sleepwalking.    Having sleep apnea can also increase your risk for restless leg syndrome and leg movements at night.   Please note that untreated obstructive sleep apnea may carry additional perioperative morbidity. Patients with significant obstructive sleep apnea (typically, in the moderate to severe degree) should receive, if possible, perioperative PAP (positive airway pressure) therapy and the surgeons and particularly the anesthesiologists should be informed of the diagnosis and the severity of the sleep disordered breathing.   I will likely see you back after your sleep study to go over the test results and where to go from there. We will call you after your sleep study to advise about the results (most likely, you will hear from Megan, my nurse) and to set up an appointment at the time, as necessary.    Our sleep lab administrative assistant will call you to schedule your sleep study and give you further instructions, regarding the check in process for the sleep study, arrival time, what to bring, when you can expect to leave after the study, etc., and to answer any other logistical questions you may have. If you don't hear back from her by about 2 weeks from now, please feel free to call her direct line at [phone] or you can call our general clinic number, or email us through My Chart.    

## 2021-03-30 NOTE — Progress Notes (Signed)
Holly Mcmillan - 50 y.o. female MRN [id number]  Date of birth: Dec 13, 1970  Office Visit Note: Visit Date: 03/17/2021 PCP: [name], MD Referred by: [name], MD  Subjective: Chief Complaint  Patient presents with   Lower Back - Pain   Right Leg - Pain   HPI:  Holly Mcmillan is a 50 y.o. female who comes in today at the request of Dr. Rodell Perna for planned Left L3-4 Lumbar Transforaminal epidural steroid injection with fluoroscopic guidance.  The patient has failed conservative care including home exercise, medications, time and activity modification.  This injection will be diagnostic and hopefully therapeutic.  Please see requesting physician notes for further details and justification.  ROS Otherwise per HPI.  Assessment & Plan: Visit Diagnoses:    ICD-10-CM   1. Lumbar radiculopathy  M54.16 XR C-ARM NO REPORT    Epidural Steroid injection    methylPREDNISolone acetate (DEPO-MEDROL) injection 80 mg      Plan: No additional findings.   Meds & Orders:  Meds ordered this encounter  Medications   methylPREDNISolone acetate (DEPO-MEDROL) injection 80 mg    Orders Placed This Encounter  Procedures   XR C-ARM NO REPORT   Epidural Steroid injection    Follow-up: Return in about 4 weeks (around 04/14/2021) for Rodell Perna, MD.   Procedures: No procedures performed  Lumbosacral Transforaminal Epidural Steroid Injection - Sub-Pedicular Approach with Fluoroscopic Guidance  Patient: Holly Mcmillan      Date of Birth: [birth year] MRN: [id number] PCP: [name], MD      Visit Date: 03/17/2021   Universal Protocol:    Date/Time: 03/17/2021  Consent Given By: the patient  Position: PRONE  Additional Comments: Vital signs were monitored before and after the procedure. Patient was prepped and draped in the usual sterile fashion. The correct patient, procedure, and site was verified.   Injection Procedure Details:   Procedure diagnoses: Lumbar  radiculopathy [M54.16]    Meds Administered:  Meds ordered this encounter  Medications   methylPREDNISolone acetate (DEPO-MEDROL) injection 80 mg    Laterality: Right  Location/Site: L3  Needle:5.0 in., 22 ga.  Short bevel or Quincke spinal needle  Needle Placement: Transforaminal  Findings:    -Comments: Excellent flow of contrast along the nerve, nerve root and into the epidural space.  Procedure Details: After squaring off the end-plates to get a true AP view, the C-arm was positioned so that an oblique view of the foramen as noted above was visualized. The target area is just inferior to the "nose of the scotty dog" or sub pedicular. The soft tissues overlying this structure were infiltrated with 2-3 ml. of 1% Lidocaine without Epinephrine.  The spinal needle was inserted toward the target using a "trajectory" view along the fluoroscope beam.  Under AP and lateral visualization, the needle was advanced so it did not puncture dura and was located close the 6 O'Clock position of the pedical in AP tracterory. Biplanar projections were used to confirm position. Aspiration was confirmed to be negative for CSF and/or blood. A 1-2 ml. volume of Isovue-250 was injected and flow of contrast was noted at each level. Radiographs were obtained for documentation purposes.   After attaining the desired flow of contrast documented above, a 0.5 to 1.0 ml test dose of 0.25% Marcaine was injected into each respective transforaminal space.  The patient was observed for 90 seconds post injection.  After no sensory deficits were reported, and normal lower extremity motor  function was noted,   the above injectate was administered so that equal amounts of the injectate were placed at each foramen (level) into the transforaminal epidural space.   Additional Comments:  No complications occurred Dressing: 2 x 2 sterile gauze and Band-Aid    Post-procedure details: Patient was observed during the  procedure. Post-procedure instructions were reviewed.  Patient left the clinic in stable condition.   Clinical History: MRI LUMBAR SPINE WITHOUT CONTRAST   TECHNIQUE: Multiplanar, multisequence MR imaging of the lumbar spine was performed. No intravenous contrast was administered.   COMPARISON:  07/18/2017   FINDINGS: Segmentation:  Standard.   Alignment:  Moderate lumbar levoscoliosis.  No listhesis.   Vertebrae: No fracture, suspicious osseous lesion, or significant marrow edema. Chronic asymmetric right-sided degenerative endplate changes at O6-7.   Conus medullaris and cauda equina: Conus extends to the L1 level. Conus and cauda equina appear normal.   Paraspinal and other soft tissues: 4 mm T2 hyperintense focus in the left kidney, likely a cyst.   Disc levels:   Disc desiccation from L1-2 to L4-5 with chronic severe right-sided disc space narrowing at L2-3 and mild narrowing at L3-4 and L4-5.   T12-L1 and L1-2: Mild facet hypertrophy without disc herniation or stenosis.   L2-3: Disc bulging eccentric to the right, a moderately large right subarticular disc protrusion, mild facet and ligamentum flavum hypertrophy, and severe right-sided disc space height loss result in mild spinal stenosis, severe right lateral recess stenosis, and moderate right neural foraminal stenosis. The disc protrusion has enlarged with greatly increased right lateral recess stenosis and right L3 nerve root impingement.   L3-4: Disc bulging eccentric to the left and moderate facet and ligamentum flavum hypertrophy result in mild right and mild-to-moderate left neural foraminal stenosis, slightly progressed. No spinal stenosis.   L4-5: Progressive disc bulging, increased size of a left paracentral and left subarticular disc protrusion, a new right foraminal disc protrusion, and moderate left greater than right facet and ligamentum flavum hypertrophy result in moderate spinal  stenosis, moderate right and severe left lateral recess stenosis, and moderate bilateral neural foraminal stenosis. Potential neural impingement bilaterally, particularly of the left L5 nerve root.   L5-S1: New minimal disc bulging and mild to moderate facet hypertrophy without stenosis.   IMPRESSION: 1. Progressive lumbar disc degeneration, most notable at L2-3 where a moderately large disc protrusion results in severe right lateral recess stenosis and right L3 nerve root impingement. 2. Moderate spinal stenosis, severe left lateral recess stenosis, and moderate bilateral neural foraminal stenosis at L4-5.     Electronically Signed   By: Logan Bores M.D.   On: 03/17/2020 18:36     Objective:  VS:  HT:    WT:   BMI:     BP:135/75  HR:66bpm  TEMP: ( )  RESP:  Physical Exam   Imaging: No results found.

## 2021-03-30 NOTE — Procedures (Signed)
Lumbosacral Transforaminal Epidural Steroid Injection - Sub-Pedicular Approach with Fluoroscopic Guidance  Patient: Holly Mcmillan      Date of Birth: 1970/06/14 MRN: [id number] PCP: [name], MD      Visit Date: 03/17/2021   Universal Protocol:    Date/Time: 03/17/2021  Consent Given By: the patient  Position: PRONE  Additional Comments: Vital signs were monitored before and after the procedure. Patient was prepped and draped in the usual sterile fashion. The correct patient, procedure, and site was verified.   Injection Procedure Details:   Procedure diagnoses: Lumbar radiculopathy [M54.16]    Meds Administered:  Meds ordered this encounter  Medications   methylPREDNISolone acetate (DEPO-MEDROL) injection 80 mg    Laterality: Right  Location/Site: L3  Needle:5.0 in., 22 ga.  Short bevel or Quincke spinal needle  Needle Placement: Transforaminal  Findings:    -Comments: Excellent flow of contrast along the nerve, nerve root and into the epidural space.  Procedure Details: After squaring off the end-plates to get a true AP view, the C-arm was positioned so that an oblique view of the foramen as noted above was visualized. The target area is just inferior to the "nose of the scotty dog" or sub pedicular. The soft tissues overlying this structure were infiltrated with 2-3 ml. of 1% Lidocaine without Epinephrine.  The spinal needle was inserted toward the target using a "trajectory" view along the fluoroscope beam.  Under AP and lateral visualization, the needle was advanced so it did not puncture dura and was located close the 6 O'Clock position of the pedical in AP tracterory. Biplanar projections were used to confirm position. Aspiration was confirmed to be negative for CSF and/or blood. A 1-2 ml. volume of Isovue-250 was injected and flow of contrast was noted at each level. Radiographs were obtained for documentation purposes.   After attaining the desired flow  of contrast documented above, a 0.5 to 1.0 ml test dose of 0.25% Marcaine was injected into each respective transforaminal space.  The patient was observed for 90 seconds post injection.  After no sensory deficits were reported, and normal lower extremity motor function was noted,   the above injectate was administered so that equal amounts of the injectate were placed at each foramen (level) into the transforaminal epidural space.   Additional Comments:  No complications occurred Dressing: 2 x 2 sterile gauze and Band-Aid    Post-procedure details: Patient was observed during the procedure. Post-procedure instructions were reviewed.  Patient left the clinic in stable condition.

## 2021-04-01 ENCOUNTER — Encounter: Payer: Self-pay | Admitting: Internal Medicine

## 2021-04-10 ENCOUNTER — Ambulatory Visit (INDEPENDENT_AMBULATORY_CARE_PROVIDER_SITE_OTHER): Payer: 59 | Admitting: Neurology

## 2021-04-10 ENCOUNTER — Ambulatory Visit (INDEPENDENT_AMBULATORY_CARE_PROVIDER_SITE_OTHER): Payer: 59 | Admitting: Internal Medicine

## 2021-04-10 ENCOUNTER — Encounter: Payer: Self-pay | Admitting: Internal Medicine

## 2021-04-10 ENCOUNTER — Other Ambulatory Visit: Payer: Self-pay

## 2021-04-10 VITALS — BP 134/86 | HR 65 | Temp 97.9°F | Resp 16 | Ht 62.0 in | Wt 216.0 lb

## 2021-04-10 DIAGNOSIS — E039 Hypothyroidism, unspecified: Secondary | ICD-10-CM

## 2021-04-10 DIAGNOSIS — G4733 Obstructive sleep apnea (adult) (pediatric): Secondary | ICD-10-CM

## 2021-04-10 DIAGNOSIS — Z820 Family history of epilepsy and other diseases of the nervous system: Secondary | ICD-10-CM

## 2021-04-10 DIAGNOSIS — I10 Essential (primary) hypertension: Secondary | ICD-10-CM | POA: Diagnosis not present

## 2021-04-10 DIAGNOSIS — G4719 Other hypersomnia: Secondary | ICD-10-CM

## 2021-04-10 DIAGNOSIS — F17200 Nicotine dependence, unspecified, uncomplicated: Secondary | ICD-10-CM | POA: Diagnosis not present

## 2021-04-10 DIAGNOSIS — Z23 Encounter for immunization: Secondary | ICD-10-CM

## 2021-04-10 DIAGNOSIS — R0683 Snoring: Secondary | ICD-10-CM

## 2021-04-10 DIAGNOSIS — E669 Obesity, unspecified: Secondary | ICD-10-CM

## 2021-04-10 DIAGNOSIS — Z6839 Body mass index (BMI) 39.0-39.9, adult: Secondary | ICD-10-CM

## 2021-04-10 DIAGNOSIS — R519 Headache, unspecified: Secondary | ICD-10-CM

## 2021-04-10 LAB — CBC WITH DIFFERENTIAL/PLATELET
Basophils Absolute: 0.1 10*3/uL (ref 0.0–0.1)
Basophils Relative: 0.5 % (ref 0.0–3.0)
Eosinophils Absolute: 0.1 10*3/uL (ref 0.0–0.7)
Eosinophils Relative: 0.8 % (ref 0.0–5.0)
HCT: 39.2 % (ref 36.0–46.0)
Hemoglobin: 12.9 g/dL (ref 12.0–15.0)
Lymphocytes Relative: 22.1 % (ref 12.0–46.0)
Lymphs Abs: 2.4 10*3/uL (ref 0.7–4.0)
MCHC: 33 g/dL (ref 30.0–36.0)
MCV: 94.2 fl (ref 78.0–100.0)
Monocytes Absolute: 1.1 10*3/uL — ABNORMAL HIGH (ref 0.1–1.0)
Monocytes Relative: 10.1 % (ref 3.0–12.0)
Neutro Abs: 7.2 10*3/uL (ref 1.4–7.7)
Neutrophils Relative %: 66.5 % (ref 43.0–77.0)
Platelets: 292 10*3/uL (ref 150.0–400.0)
RBC: 4.16 Mil/uL (ref 3.87–5.11)
RDW: 13.4 % (ref 11.5–15.5)
WBC: 10.8 10*3/uL — ABNORMAL HIGH (ref 4.0–10.5)

## 2021-04-10 LAB — BASIC METABOLIC PANEL
BUN: 15 mg/dL (ref 6–23)
CO2: 27 mEq/L (ref 19–32)
Calcium: 9.3 mg/dL (ref 8.4–10.5)
Chloride: 105 mEq/L (ref 96–112)
Creatinine, Ser: 0.81 mg/dL (ref 0.40–1.20)
GFR: 83.83 mL/min (ref >60.00)
Glucose, Bld: 77 mg/dL (ref 70–99)
Potassium: 3.9 mEq/L (ref 3.5–5.1)
Sodium: 140 mEq/L (ref 135–145)

## 2021-04-10 LAB — TSH: TSH: 1.93 u[IU]/mL (ref 0.35–5.50)

## 2021-04-10 MED ORDER — SAXENDA 18 MG/3ML ~~LOC~~ SOPN: 3.0000 mg | PEN_INJECTOR | Freq: Every day | SUBCUTANEOUS | 1 refills | Status: DC

## 2021-04-10 NOTE — Progress Notes (Addendum)
wegovy  Subjective:  Patient ID: [name], female    DOB: 1970-11-24  Age: 50 y.o. MRN: [id number]  CC: Hypertension and Hypothyroidism  This visit occurred during the SARS-CoV-2 public health emergency.  Safety protocols were in place, including screening questions prior to the visit, additional usage of staff PPE, and extensive cleaning of exam room while observing appropriate contact time as indicated for disinfecting solutions.    HPI INARI SHIN presents for f/up -  She has recently seen neurology and is taking Topamax for chronic headache.  She tells me that Topamax has helped but it has caused some trouble with word finding difficulty and dry mouth so she has not increased the dose.  She complains of chronic fatigue and weight gain.  She would like to do something about her weight.  She quit smoking tobacco a few weeks ago.  She is active and denies chest pain, shortness of breath, diet paresis, edema, dizziness, or lightheadedness.  Outpatient Medications Prior to Visit  Medication Sig Dispense Refill   buPROPion (WELLBUTRIN XL) 300 MG 24 hr tablet TAKE 1 TABLET BY MOUTH EVERY DAY 30 tablet 5   DULoxetine (CYMBALTA) 60 MG capsule TAKE 1 CAPSULE BY MOUTH EVERY DAY 30 capsule 5   estradiol (ESTRACE) 2 MG tablet Take 2-4 mg by mouth daily.     fluticasone (FLONASE) 50 MCG/ACT nasal spray SPRAY 2 SPRAYS INTO EACH NOSTRIL EVERY DAY 16 mL 5   KLOR-CON M20 20 MEQ tablet TAKE 1 TABLET BY MOUTH EVERY DAY 90 tablet 1   levothyroxine (SYNTHROID) 150 MCG tablet Take 1 tablet (150 mcg total) by mouth daily. 90 tablet 0   nebivolol (BYSTOLIC) 5 MG tablet Take 1 tablet (5 mg total) by mouth daily. 90 tablet 0   ondansetron (ZOFRAN ODT) 4 MG disintegrating tablet Take 1 tablet (4 mg total) by mouth every 8 (eight) hours as needed for nausea or vomiting. 20 tablet 6   progesterone (PROMETRIUM) 100 MG capsule Take 100 mg by mouth at bedtime.     SUMAtriptan (IMITREX) 50 MG tablet Take 1 tablet  (50 mg total) by mouth every 2 (two) hours as needed for migraine. May repeat in 2 hours if headache persists or recurs. 12 tablet 6   tiZANidine (ZANAFLEX) 4 MG tablet Take 1 tablet (4 mg total) by mouth every 6 (six) hours as needed for muscle spasms. 30 tablet 6   topiramate (TOPAMAX) 100 MG tablet Take 1 tablet (100 mg total) by mouth 2 (two) times daily. 60 tablet 6   valACYclovir (VALTREX) 1000 MG tablet TAKE 2 TABLETS BY MOUTH 2 TIMES DAILY FOR 2 DOSES. 4 tablet 2   No facility-administered medications prior to visit.    ROS Review of Systems  Constitutional:  Positive for fatigue and unexpected weight change. Negative for chills and diaphoresis.  HENT: Negative.    Eyes: Negative.   Respiratory:  Negative for cough, chest tightness, shortness of breath and wheezing.   Cardiovascular:  Negative for chest pain, palpitations and leg swelling.  Gastrointestinal:  Negative for abdominal pain, constipation, diarrhea, nausea and vomiting.  Endocrine: Negative for cold intolerance and heat intolerance.  Genitourinary: Negative.   Musculoskeletal:  Positive for arthralgias.  Skin: Negative.  Negative for color change.  Neurological:  Negative for dizziness, weakness, light-headedness and numbness.  Hematological:  Negative for adenopathy. Does not bruise/bleed easily.  Psychiatric/Behavioral: Negative.     Objective:  BP 134/86 (BP Location: Right Arm, Patient Position: Sitting, Cuff Size:  Large)   Pulse 65   Temp 97.9 F (36.6 C) (Oral)   Resp 16   Ht 5\' 2"  (1.575 m)   Wt 216 lb (98 kg)   SpO2 96%   BMI 39.51 kg/m   BP Readings from Last 3 Encounters:  04/10/21 134/86  03/18/21 135/86  03/17/21 135/75    Wt Readings from Last 3 Encounters:  04/10/21 216 lb (98 kg)  03/18/21 215 lb 9.6 oz (97.8 kg)  03/03/21 216 lb (98 kg)    Physical Exam Vitals reviewed.  Constitutional:      Appearance: She is obese. She is not ill-appearing.  HENT:     Nose: Nose normal.      Mouth/Throat:     Mouth: Mucous membranes are moist.  Eyes:     General: No scleral icterus.    Conjunctiva/sclera: Conjunctivae normal.  Cardiovascular:     Rate and Rhythm: Normal rate and regular rhythm.     Heart sounds: No murmur heard. Pulmonary:     Effort: Pulmonary effort is normal.     Breath sounds: No stridor. No wheezing, rhonchi or rales.  Abdominal:     General: Abdomen is protuberant. Bowel sounds are normal. There is no distension.     Palpations: There is no hepatomegaly, splenomegaly or mass.     Tenderness: There is no abdominal tenderness.  Musculoskeletal:        General: Normal range of motion.     Cervical back: Neck supple.     Right lower leg: No edema.     Left lower leg: No edema.  Lymphadenopathy:     Cervical: No cervical adenopathy.  Skin:    General: Skin is warm and dry.  Neurological:     General: No focal deficit present.     Mental Status: Mental status is at baseline.  Psychiatric:        Mood and Affect: Mood normal.        Behavior: Behavior normal.    Lab Results  Component Value Date   WBC 10.8 (H) 04/10/2021   HGB 12.9 04/10/2021   HCT 39.2 04/10/2021   PLT 292.0 04/10/2021   GLUCOSE 77 04/10/2021   CHOL 251 (H) 06/07/2020   TRIG 123.0 06/07/2020   HDL 63.20 06/07/2020   LDLDIRECT 173.0 10/25/2018   LDLCALC 163 (H) 06/07/2020   ALT 23 01/20/2021   AST 18 01/20/2021   NA 140 04/10/2021   K 3.9 04/10/2021   CL 105 04/10/2021   CREATININE 0.81 04/10/2021   BUN 15 04/10/2021   CO2 27 04/10/2021   TSH 1.93 04/10/2021   HGBA1C 5.5 03/16/2016    MR Brain Wo Contrast  Result Date: 12/16/2020 CLINICAL DATA:  New daily persistent headache G44.52 (ICD-10-CM). Headache, new or worsening (Age >= 50y). Intractable migraine without aura and with status migrainosus G43.011 (ICD-10-CM). Additional history provided: Patient reports headache for years, worsening. EXAM: MRI HEAD WITHOUT CONTRAST TECHNIQUE: Multiplanar, multiecho pulse  sequences of the brain and surrounding structures were obtained without intravenous contrast. COMPARISON:  Head CT 04/08/2008. FINDINGS: Brain: Cerebral volume is normal for age. There are a few small scattered foci of T2/FLAIR hyperintensity within the cerebral white matter, the largest measuring 4 mm within the posterior right frontal lobe white matter (series 8, image 21). There is no acute infarct. No evidence of an intracranial mass. No chronic intracranial blood products. No extra-axial fluid collection. No midline shift. Vascular: Maintained flow voids within the proximal large arterial vessels. Skull  and upper cervical spine: No focal suspicious marrow lesion. Sinuses/Orbits: Visualized orbits show no acute finding. No significant paranasal sinus disease. Other: Incidentally noted 9 mm ovoid T2 hyperintense lesion in the region of the right palatine tonsil (series 12, image 20). Impression #4 will be called to the ordering clinician or representative by the Radiologist Assistant, and communication documented in the PACS or [employer]. IMPRESSION: No evidence of acute intracranial abnormality. There are few small scattered T2/FLAIR hyperintense signal changes within the cerebral white matter, the largest measuring 4 mm within the posterior right frontal lobe. These findings are nonspecific, but most often secondary to chronic small vessel ischemia. Sequela of chronic migraine headaches also considered given the provided history. Additional considerations include sequela of a prior infectious/inflammatory process or a demyelinating process (such as multiple sclerosis), among others. Otherwise unremarkable non-contrast MRI appearance of the brain. Incidentally noted 9 mm ovoid T2 hyperintense lesion in the region of the right palatine tonsil. This may reflect a cyst. However, ENT consultation and direct visualization are recommended to exclude a mass at this site. Electronically Signed   By: Kellie Simmering  D.O.   On: 12/16/2020 08:07    Assessment & Plan:   Shandricka was seen today for hypertension and hypothyroidism.  Diagnoses and all orders for this visit:  Essential hypertension- Her blood pressure is well controlled.  Electrolytes and renal function are normal. -     CBC with Differential/Platelet; Future -     Basic metabolic panel; Future -     TSH; Future -     TSH -     Basic metabolic panel -     CBC with Differential/Platelet  Acquired hypothyroidism- Her TSH is in the normal range.  She will stay on the current T4 dosage. -     TSH; Future -     TSH  TOBACCO USE -     Ambulatory Referral for Lung Cancer Scre  Class 2 severe obesity due to excess calories with serious comorbidity and body mass index (BMI) of 39.0 to 39.9 in adult [facility])- In addition to lifestyle modifications I recommended that she start using a GLP-1 agonist. -     Liraglutide -Weight Management (SAXENDA) 18 MG/3ML SOPN; Inject 3 mg into the skin daily.  Other orders -     Flu Vaccine QUAD 6+ mos PF IM (Fluarix Quad PF)  I am having Rockie Neighbours. Gries start on Saxenda. I am also having her maintain her progesterone, estradiol, fluticasone, valACYclovir, DULoxetine, nebivolol, levothyroxine, Klor-Con M20, buPROPion, topiramate, SUMAtriptan, ondansetron, and tiZANidine.  Meds ordered this encounter  Medications   Liraglutide -Weight Management (SAXENDA) 18 MG/3ML SOPN    Sig: Inject 3 mg into the skin daily.    Dispense:  3 mL    Refill:  1      Follow-up: Return in about 3 months (around 07/09/2021).  Scarlette Calico, MD

## 2021-04-10 NOTE — Patient Instructions (Addendum)
Directions for Saxenda: 0.6 mg daily for 1 week, 1.2 mg daily for 1 week, 1.8 mg daily for 1 week, 2.4 mg daily for 1 week, 3.0 mg weekly. Maximum daily dose is 3.0 mg weekly.    Hypothyroidism Hypothyroidism is when the thyroid gland does not make enough of certain hormones (it is underactive). The thyroid gland is a small gland located in the lower front part of the neck, just in front of the windpipe (trachea). This gland makes hormones that help control how the body uses food for energy (metabolism) as well as how the heart and brain function. These hormones also play a role in keeping your bones strong. When the thyroid is underactive, it produces too little of the hormones thyroxine (T4) and triiodothyronine (T3). What are the causes? This condition may be caused by: Hashimoto's disease. This is a disease in which the body's disease-fighting system (immune system) attacks the thyroid gland. This is the most common cause. Viral infections. Pregnancy. Certain medicines. Birth defects. Past radiation treatments to the head or neck for cancer. Past treatment with radioactive iodine. Past exposure to radiation in the environment. Past surgical removal of part or all of the thyroid. Problems with a gland in the center of the brain (pituitary gland). Lack of enough iodine in the diet. What increases the risk? You are more likely to develop this condition if: You are female. You have a family history of thyroid conditions. You use a medicine called lithium. You take medicines that affect the immune system (immunosuppressants). What are the signs or symptoms? Symptoms of this condition include: Feeling as though you have no energy (lethargy). Not being able to tolerate cold. Weight gain that is not explained by a change in diet or exercise habits. Lack of appetite. Dry skin. Coarse hair. Menstrual irregularity. Slowing of thought processes. Constipation. Sadness or depression. How is  this diagnosed? This condition may be diagnosed based on: Your symptoms, your medical history, and a physical exam. Blood tests. You may also have imaging tests, such as an ultrasound or MRI. How is this treated? This condition is treated with medicine that replaces the thyroid hormones that your body does not make. After you begin treatment, it may take several weeks for symptoms to go away. Follow these instructions at home: Take over-the-counter and prescription medicines only as told by your health care provider. If you start taking any new medicines, tell your health care provider. Keep all follow-up visits as told by your health care provider. This is important. As your condition improves, your dosage of thyroid hormone medicine may change. You will need to have blood tests regularly so that your health care provider can monitor your condition. Contact a health care provider if: Your symptoms do not get better with treatment. You are taking thyroid hormone replacement medicine and you: Sweat a lot. Have tremors. Feel anxious. Lose weight rapidly. Cannot tolerate heat. Have emotional swings. Have diarrhea. Feel weak. Get help right away if you have: Chest pain. An irregular heartbeat. A rapid heartbeat. Difficulty breathing. Summary Hypothyroidism is when the thyroid gland does not make enough of certain hormones (it is underactive). When the thyroid is underactive, it produces too little of the hormones thyroxine (T4) and triiodothyronine (T3). The most common cause is Hashimoto's disease, a disease in which the body's disease-fighting system (immune system) attacks the thyroid gland. The condition can also be caused by viral infections, medicine, pregnancy, or past radiation treatment to the head or neck. Symptoms may  include weight gain, dry skin, constipation, feeling as though you do not have energy, and not being able to tolerate cold. This condition is treated with  medicine to replace the thyroid hormones that your body does not make. This information is not intended to replace advice given to you by your health care provider. Make sure you discuss any questions you have with your health care provider. Document Revised: 12/26/2020 Document Reviewed: 01/04/2020 Elsevier Patient Education  2022 [employer].

## 2021-04-14 ENCOUNTER — Institutional Professional Consult (permissible substitution): Payer: 59 | Admitting: Neurology

## 2021-04-15 NOTE — Progress Notes (Signed)
°  ° °  [facility]  HOME SLEEP TEST (Watch PAT) REPORT  STUDY DATE: 04/10/2021  DOB: 08/04/1970  MRN: [id number]  ORDERING CLINICIAN: Star Age, MD, PhD   REFERRING CLINICIAN: [name], MD   CLINICAL INFORMATION/HISTORY: 50 year old right-handed woman with an underlying medical history of arthritis, headaches, hypertension, hyperlipidemia, hypothyroidism, ovarian cyst, depression, and obesity, who reports snoring and excessive daytime somnolence.   Epworth sleepiness score: 16/24.  BMI: 39.8 kg/m  FINDINGS:   Sleep Summary:   Total Recording Time (hours, min): 7 hours, 52 minutes  Total Sleep Time (hours, min):  6 hours, 48 minutes   Percent REM (%):    15.9%   Respiratory Indices:   Calculated pAHI (per hour):  14.7/hour         REM pAHI:    16.1/hour       NREM pAHI: 14.4/hour  Oxygen Saturation Statistics:    Oxygen Saturation (%) Mean: 95%   Minimum oxygen saturation (%):                 78%   O2 Saturation Range (%): 78-100%    O2 Saturation (minutes) <=88%: 0.1 min  Pulse Rate Statistics:   Pulse Mean (bpm):    64/min    Pulse Range (56-85/min)   IMPRESSION: OSA (obstructive sleep apnea)   RECOMMENDATION:  This home sleep test demonstrates (near) moderate obstructive sleep apnea with a total AHI of 14.7/hour and O2 nadir of 78%.  Intermittent mild to moderate snoring was detected. Treatment with positive airway pressure is recommended. The patient will be advised to proceed with an autoPAP titration/trial at home for now. A full night titration study may be considered to optimize treatment settings, if needed down the road.  Alternative treatment options may include dental treatment with the help of a specialized dentist or surgical options.  Concomitant weight loss is recommended.  Please note that untreated obstructive sleep apnea may carry additional perioperative morbidity. Patients with significant obstructive sleep apnea  should receive perioperative PAP therapy and the surgeons and particularly the anesthesiologist should be informed of the diagnosis and the severity of the sleep disordered breathing. The patient should be cautioned not to drive, work at heights, or operate dangerous or heavy equipment when tired or sleepy. Review and reiteration of good sleep hygiene measures should be pursued with any patient. Other causes of the patient's symptoms, including circadian rhythm disturbances, an underlying mood disorder, medication effect and/or an underlying medical problem cannot be ruled out based on this test. Clinical correlation is recommended. The patient and her referring provider will be notified of the test results. The patient will be seen in follow up in sleep clinic at [facility].  I certify that I have reviewed the raw data recording prior to the issuance of this report in accordance with the standards of the American Academy of Sleep Medicine (AASM).  INTERPRETING PHYSICIAN:   Star Age, MD, PhD  Board Certified in Neurology and Sleep Medicine  [facility] [address], [place], [place] [address] [phone]

## 2021-04-16 ENCOUNTER — Other Ambulatory Visit: Payer: Self-pay | Admitting: Internal Medicine

## 2021-04-16 DIAGNOSIS — I10 Essential (primary) hypertension: Secondary | ICD-10-CM

## 2021-04-16 DIAGNOSIS — E039 Hypothyroidism, unspecified: Secondary | ICD-10-CM

## 2021-04-16 NOTE — Procedures (Signed)
°  ° °  [facility]  HOME SLEEP TEST (Watch PAT) REPORT  STUDY DATE: 04/10/2021  DOB: 1970/12/02  MRN: [id number]  ORDERING CLINICIAN: Star Age, MD, PhD   REFERRING CLINICIAN: [name], MD   CLINICAL INFORMATION/HISTORY: 49 year old right-handed woman with an underlying medical history of arthritis, headaches, hypertension, hyperlipidemia, hypothyroidism, ovarian cyst, depression, and obesity, who reports snoring and excessive daytime somnolence.   Epworth sleepiness score: 16/24.  BMI: 39.8 kg/m  FINDINGS:   Sleep Summary:   Total Recording Time (hours, min): 7 hours, 52 minutes  Total Sleep Time (hours, min):  6 hours, 48 minutes   Percent REM (%):    15.9%   Respiratory Indices:   Calculated pAHI (per hour):  14.7/hour         REM pAHI:    16.1/hour       NREM pAHI: 14.4/hour  Oxygen Saturation Statistics:    Oxygen Saturation (%) Mean: 95%   Minimum oxygen saturation (%):                 78%   O2 Saturation Range (%): 78-100%    O2 Saturation (minutes) <=88%: 0.1 min  Pulse Rate Statistics:   Pulse Mean (bpm):    64/min    Pulse Range (56-85/min)   IMPRESSION: OSA (obstructive sleep apnea)   RECOMMENDATION:  This home sleep test demonstrates (near) moderate obstructive sleep apnea with a total AHI of 14.7/hour and O2 nadir of 78%.  Intermittent mild to moderate snoring was detected. Treatment with positive airway pressure is recommended. The patient will be advised to proceed with an autoPAP titration/trial at home for now. A full night titration study may be considered to optimize treatment settings, if needed down the road.  Alternative treatment options may include dental treatment with the help of a specialized dentist or surgical options.  Concomitant weight loss is recommended.  Please note that untreated obstructive sleep apnea may carry additional perioperative morbidity. Patients with significant obstructive sleep apnea  should receive perioperative PAP therapy and the surgeons and particularly the anesthesiologist should be informed of the diagnosis and the severity of the sleep disordered breathing. The patient should be cautioned not to drive, work at heights, or operate dangerous or heavy equipment when tired or sleepy. Review and reiteration of good sleep hygiene measures should be pursued with any patient. Other causes of the patient's symptoms, including circadian rhythm disturbances, an underlying mood disorder, medication effect and/or an underlying medical problem cannot be ruled out based on this test. Clinical correlation is recommended. The patient and her referring provider will be notified of the test results. The patient will be seen in follow up in sleep clinic at [facility].  I certify that I have reviewed the raw data recording prior to the issuance of this report in accordance with the standards of the American Academy of Sleep Medicine (AASM).  INTERPRETING PHYSICIAN:   Star Age, MD, PhD  Board Certified in Neurology and Sleep Medicine  [facility] [address] [place], [place] [address] [phone]

## 2021-04-16 NOTE — Addendum Note (Signed)
Addended by: Star Age on: 04/16/2021 05:31 PM   Modules accepted: Orders

## 2021-04-21 ENCOUNTER — Encounter: Payer: Self-pay | Admitting: Internal Medicine

## 2021-04-21 ENCOUNTER — Encounter: Payer: Self-pay | Admitting: Neurology

## 2021-04-21 ENCOUNTER — Telehealth: Payer: Self-pay | Admitting: *Deleted

## 2021-04-21 ENCOUNTER — Encounter: Payer: Self-pay | Admitting: *Deleted

## 2021-04-21 NOTE — Telephone Encounter (Signed)
Star Age, MD  04/16/2021  5:30 PM EST Back to Top    Patient referred by Dr. Ronnald Ramp, seen by me on 03/18/2021, patient had a HST on 04/10/2021.     Please call and notify the patient that the recent home sleep test showed obstructive sleep apnea in the moderate range. I recommend treatment in the form of autoPAP, which means, that we don't have to bring her in for a sleep study with CPAP, but will let her start using a so called autoPAP machine at home, which is a CPAP-like machine with self-adjusting pressures. We will send the order to a local DME company (of her choice, or as per insurance requirement). The DME representative will fit her with a mask, educate her on how to use the machine, how to put the mask on, etc. I have placed an order in the chart. Please send the order, talk to patient, send report to referring MD. We will need a FU in sleep clinic for 10 weeks post-PAP set up, please arrange that with me or one of our NPs. Also reinforce the need for compliance with treatment. Thanks,    Star Age, MD, PhD [facility])

## 2021-04-21 NOTE — Telephone Encounter (Signed)
-----   Message from Star Age, MD sent at 04/16/2021  5:30 PM EST ----- Patient referred by Dr. Ronnald Ramp, seen by me on 03/18/2021, patient had a HST on 04/10/2021.    Please call and notify the patient that the recent home sleep test showed obstructive sleep apnea in the moderate range. I recommend treatment in the form of autoPAP, which means, that we don't have to bring her in for a sleep study with CPAP, but will let her start using a so called autoPAP machine at home, which is a CPAP-like machine with self-adjusting pressures. We will send the order to a local DME company (of her choice, or as per insurance requirement). The DME representative will fit her with a mask, educate her on how to use the machine, how to put the mask on, etc. I have placed an order in the chart. Please send the order, talk to patient, send report to referring MD. We will need a FU in sleep clinic for 10 weeks post-PAP set up, please arrange that with me or one of our NPs. Also reinforce the need for compliance with treatment. Thanks,   Star Age, MD, PhD [facility])

## 2021-04-21 NOTE — Telephone Encounter (Signed)
I spoke with the patient and discussed the sleep study results. She understands the study showed OSA in the moderate range.  She is amenable to starting the process to obtain an AutoPap machine for treatment for her sleep apnea.  We discussed insurance requirements which includes using the machine at least 4 hours every night and being seen for a follow-up between 30 and 90 days after she starts using her machine.  The patient does not have a preference for a DME company. I advised we can send the orders to [place]. The patient understands [facility] will set her up with a machine, fit her with a mask, educate her on how to use the machine, how to put the mask on, etc.  The patient was scheduled for an appointment on July 24, 2021 at 12:45 PM.  Patient aware to bring her machine and power cord to her first visit.  I let her know [facility] would give her a welcome call in approx 48 hours. She verbalized appreciation for the call and preferred to have the f/u letter sent to [facility].  Letter sent to pt. Order, office note, SS results, and insurance/pt info sent to [facility]. Received a receipt of confirmation. Results also routed to Dr Scarlette Calico (PCP, referring MD) as Juluis Rainier.

## 2021-04-28 ENCOUNTER — Encounter: Payer: Self-pay | Admitting: Internal Medicine

## 2021-04-29 ENCOUNTER — Other Ambulatory Visit: Payer: Self-pay | Admitting: Internal Medicine

## 2021-04-29 MED ORDER — SAXENDA 18 MG/3ML ~~LOC~~ SOPN: 3.0000 mg | PEN_INJECTOR | Freq: Every day | SUBCUTANEOUS | 1 refills | Status: DC

## 2021-05-01 NOTE — Telephone Encounter (Signed)
Key: Devota Pace

## 2021-05-01 NOTE — Telephone Encounter (Signed)
Approved until 08/29/2021

## 2021-05-04 HISTORY — PX: CYST EXCISION: SHX5701

## 2021-05-08 ENCOUNTER — Other Ambulatory Visit: Payer: Self-pay | Admitting: Internal Medicine

## 2021-05-08 DIAGNOSIS — Z6839 Body mass index (BMI) 39.0-39.9, adult: Secondary | ICD-10-CM

## 2021-05-08 MED ORDER — INSULIN PEN NEEDLE 32G X 6 MM MISC: 1.0000 | Freq: Every day | 1 refills | Status: DC

## 2021-05-13 ENCOUNTER — Other Ambulatory Visit: Payer: Self-pay | Admitting: Internal Medicine

## 2021-05-13 DIAGNOSIS — M150 Primary generalized (osteo)arthritis: Secondary | ICD-10-CM

## 2021-05-13 DIAGNOSIS — M159 Polyosteoarthritis, unspecified: Secondary | ICD-10-CM

## 2021-05-13 DIAGNOSIS — F325 Major depressive disorder, single episode, in full remission: Secondary | ICD-10-CM

## 2021-06-04 ENCOUNTER — Other Ambulatory Visit: Payer: Self-pay

## 2021-06-04 DIAGNOSIS — Z87891 Personal history of nicotine dependence: Secondary | ICD-10-CM

## 2021-06-25 ENCOUNTER — Encounter: Payer: Self-pay | Admitting: Acute Care

## 2021-06-25 ENCOUNTER — Ambulatory Visit (INDEPENDENT_AMBULATORY_CARE_PROVIDER_SITE_OTHER): Payer: 59 | Admitting: Acute Care

## 2021-06-25 DIAGNOSIS — Z87891 Personal history of nicotine dependence: Secondary | ICD-10-CM | POA: Diagnosis not present

## 2021-06-25 NOTE — Patient Instructions (Addendum)
Thank you for participating in the  Lung Cancer Screening Program. °It was our pleasure to meet you today. °We will call you with the results of your scan within the next few days. °Your scan will be assigned a Lung RADS category score by the physicians reading the scans.  °This Lung RADS score determines follow up scanning.  °See below for description of categories, and follow up screening recommendations. °We will be in touch to schedule your follow up screening annually or based on recommendations of our providers. °We will fax a copy of your scan results to your Primary Care Physician, or the physician who referred you to the program, to ensure they have the results. °Please call the office if you have any questions or concerns regarding your scanning experience or results.  °Our office number is [phone]. °Please speak with Denise Phelps, RN. She is our Lung Cancer Screening RN. °If she is unavailable when you call, please have the office staff send her a message. She will return your call at her earliest convenience. °Remember, if your scan is normal, we will scan you annually as long as you continue to meet the criteria for the program. (Age 55-77, Current smoker or smoker who has quit within the last 15 years). °If you are a smoker, remember, quitting is the single most powerful action that you can take to decrease your risk of lung cancer and other pulmonary, breathing related problems. °We know quitting is hard, and we are here to help.  °Please let us know if there is anything we can do to help you meet your goal of quitting. °If you are a former smoker, congratulations. We are proud of you! Remain smoke free! °Remember you can refer friends or family members through the number above.  °We will screen them to make sure they meet criteria for the program. °Thank you for helping us take better care of you by participating in Lung Screening. ° °You can receive free nicotine replacement therapy  ( patches, gum or mints) by calling 1-800-QUIT NOW. Please call so we can get you on the path to becoming  a non-smoker. I know it is hard, but you can do this! ° °Lung RADS Categories: ° °Lung RADS 1: no nodules or definitely non-concerning nodules.  °Recommendation is for a repeat annual scan in 12 months. ° °Lung RADS 2:  nodules that are non-concerning in appearance and behavior with a very low likelihood of becoming an active cancer. °Recommendation is for a repeat annual scan in 12 months. ° °Lung RADS 3: nodules that are probably non-concerning , includes nodules with a low likelihood of becoming an active cancer.  Recommendation is for a 6-month repeat screening scan. Often noted after an upper respiratory illness. We will be in touch to make sure you have no questions, and to schedule your 6-month scan. ° °Lung RADS 4 A: nodules with concerning findings, recommendation is most often for a follow up scan in 3 months or additional testing based on our provider's assessment of the scan. We will be in touch to make sure you have no questions and to schedule the recommended 3 month follow up scan. ° °Lung RADS 4 B:  indicates findings that are concerning. We will be in touch with you to schedule additional diagnostic testing based on our provider's  assessment of the scan. ° °Hypnosis for smoking cessation  °[employer] °[phone] ° °Acupuncture for smoking cessation  °[facility] °[phone]  °

## 2021-06-25 NOTE — Progress Notes (Signed)
Virtual Visit via Telephone Note  I connected with Holly Mcmillan on 06/25/21 at 12:00 PM EST by telephone and verified that I am speaking with the correct person using two identifiers.  Location: Patient:  At home Provider:  Birch Mcmillan, Holly [place]mill[place], [place], [address]    I discussed the limitations, risks, security and privacy concerns of performing an evaluation and management service by telephone and the availability of in person appointments. I also discussed with the patient that there may be a patient responsible charge related to this service. The patient expressed understanding and agreed to proceed.   Shared Decision Making Visit Lung Cancer Screening Program [phone])   Eligibility: Age 51 y.o. Pack Years Smoking History Calculation 24 pack year smoking history (# packs/per year x # years smoked) Recent History of coughing up blood  no Unexplained weight loss? no ( >Than 15 pounds within the last 6 months ) Prior History Lung / other cancer no (Diagnosis within the last 5 years already requiring surveillance chest CT Scans). Smoking Status Former Smoker Former Smokers: Years since quit:  NA  Quit Date:  NA  Visit Components: Discussion included one or more decision making aids. yes Discussion included risk/benefits of screening. yes Discussion included potential follow up diagnostic testing for abnormal scans. yes Discussion included meaning and risk of over diagnosis. yes Discussion included meaning and risk of False Positives. yes Discussion included meaning of total radiation exposure. yes  Counseling Included: Importance of adherence to annual lung cancer LDCT screening. yes Impact of comorbidities on ability to participate in the program. yes Ability and willingness to under diagnostic treatment. yes  Smoking Cessation Counseling: Current Smokers:  Discussed importance of smoking cessation. yes Information about tobacco cessation classes and interventions  provided to patient. yes Patient provided with "ticket" for LDCT Scan. yes Symptomatic Patient. no  Counseling NA Diagnosis Code: Tobacco Use Z72.0 Asymptomatic Patient yes  Counseling (Intermediate counseling: > three minutes counseling) B1478 Former Smokers:  Discussed the importance of maintaining cigarette abstinence. yes Diagnosis Code: Personal History of Nicotine Dependence. G95.621 Information about tobacco cessation classes and interventions provided to patient. Yes Patient provided with "ticket" for LDCT Scan. yes Written Order for Lung Cancer Screening with LDCT placed in Epic. Yes (CT Chest Lung Cancer Screening Low Dose W/O CM) [id number] Z12.2-Screening of respiratory organs Z87.891-Personal history of nicotine dependence  I spent 25 minutes of face to face time/virtual visit time  with  [name] discussing the risks and benefits of lung cancer screening. We took the time to pause the power point at intervals to allow for questions to be asked and answered to ensure understanding. We discussed that she had taken the single most powerful action possible to decrease her risk of developing lung cancer when she quit smoking. I counseled her to remain smoke free, and to contact me if she ever had the desire to smoke again so that I can provide resources and tools to help support the effort to remain smoke free. We discussed the time and location of the scan, and that either  Doroteo Glassman RN, Joella Prince, RN or I  or I will call / send a letter with the results within  24-72 hours of receiving them. She has the office contact information in the event she needs to speak with me,  she verbalized understanding of all of the above and had no further questions upon leaving the office.     I explained to the patient that there  has been a high incidence of coronary artery disease noted on these exams. I explained that this is a non-gated exam therefore degree or severity cannot be determined.  This patient is not on statin therapy. I have asked the patient to follow-up with their PCP regarding any incidental finding of coronary artery disease and management with diet or medication as they feel is clinically indicated. The patient verbalized understanding of the above and had no further questions.     Magdalen Spatz, NP 06/25/2021

## 2021-06-26 ENCOUNTER — Other Ambulatory Visit: Payer: Self-pay

## 2021-06-26 ENCOUNTER — Ambulatory Visit
Admission: RE | Admit: 2021-06-26 | Discharge: 2021-06-26 | Disposition: A | Discharge status: Home / Self Care | Payer: 59 | Source: Ambulatory Visit | Attending: Acute Care | Admitting: Acute Care

## 2021-06-26 DIAGNOSIS — Z87891 Personal history of nicotine dependence: Secondary | ICD-10-CM

## 2021-06-30 ENCOUNTER — Other Ambulatory Visit: Payer: Self-pay

## 2021-06-30 DIAGNOSIS — Z87891 Personal history of nicotine dependence: Secondary | ICD-10-CM

## 2021-07-13 ENCOUNTER — Other Ambulatory Visit: Payer: Self-pay | Admitting: Internal Medicine

## 2021-07-13 DIAGNOSIS — T502X5A Adverse effect of carbonic-anhydrase inhibitors, benzothiadiazides and other diuretics, initial encounter: Secondary | ICD-10-CM

## 2021-07-13 DIAGNOSIS — E039 Hypothyroidism, unspecified: Secondary | ICD-10-CM

## 2021-07-13 DIAGNOSIS — E876 Hypokalemia: Secondary | ICD-10-CM

## 2021-07-13 DIAGNOSIS — I10 Essential (primary) hypertension: Secondary | ICD-10-CM

## 2021-07-24 ENCOUNTER — Ambulatory Visit (INDEPENDENT_AMBULATORY_CARE_PROVIDER_SITE_OTHER): Payer: 59 | Admitting: Neurology

## 2021-07-24 ENCOUNTER — Encounter: Payer: Self-pay | Admitting: Neurology

## 2021-07-24 VITALS — BP 133/90 | HR 81 | Ht 62.0 in | Wt 211.5 lb

## 2021-07-24 DIAGNOSIS — G4733 Obstructive sleep apnea (adult) (pediatric): Secondary | ICD-10-CM

## 2021-07-24 DIAGNOSIS — G4719 Other hypersomnia: Secondary | ICD-10-CM | POA: Diagnosis not present

## 2021-07-24 DIAGNOSIS — G43709 Chronic migraine without aura, not intractable, without status migrainosus: Secondary | ICD-10-CM | POA: Diagnosis not present

## 2021-07-24 NOTE — Progress Notes (Signed)
Orders faxed to DME ?

## 2021-07-24 NOTE — Progress Notes (Signed)
? ? ?PATIENT: Holly Mcmillan ?DOB: December 08, 1970 ? ?REASON FOR VISIT: Follow up for initial CPAP ?HISTORY FROM: Patient ?PRIMARY NEUROLOGIST: Dr. Rexene Alberts  ? ?ASSESSMENT AND PLAN ?51 y.o. year old female  ? ?1.  OSA on CPAP ?-Order mask refit, try to use again, call for any problems, revisit in 4-6 weeks for CPAP check in  ? ?2.  Frequent morning headache, migraine history ?-Much improved with Topamax, continue 100 mg twice daily, Imitrex PRN for acute headache ?-MRI of the brain showed mild small vessel disease ? ?HISTORY  ?Holly Mcmillan, is a 51 year old female, seen in request by her primary care doctor Holly Mcmillan for evaluation of frequent headaches, initial evaluation was March 03, 2021. ?  ?I reviewed and summarized the referring note. PMhx ?HTN ?Hypothyroidism ?Depression ?  ?She had long history of chronic headache, initially only happening intermittently, holoacranial pressure occasionally pounding headache with light noise sensitivity, over the years, she has get into the habit of taking almost daily BC powder, 3 pouches each day, since July 2022, she reported worsening headaches, over the last year, she had about 20 pounds weight gain, she often wake up early morning, complains of moderate to severe pressure headache, occasionally with light noise sensitivity, she has to take 3 BC powder at once, often have to have second dose later ? ?Her headache gradually ease up as day goes by,  she has never tried triptan treatment, ?  ?In addition, she complains of extreme fatigue over the past few months, sleepy and tired, often have to take a nap right after work, her husband reported that she has snoring, she has frequent awakening at nighttime, with morning headaches, today's examination showed narrow oropharyngeal space, at high risk for obstructive sleep apnea, is on schedule for sleep consultation ? ?Copied Dr. Guadelupe Sabin note 03/18/2021: I saw your patient, Holly Mcmillan, upon your kind request in my sleep clinic  today for initial consultation of her sleep disorder, in particular, concern for underlying obstructive sleep apnea.  The patient is unaccompanied today.  As you know, Holly Mcmillan is a 51 year old right-handed woman with an underlying medical history of arthritis, headaches, hypertension, hyperlipidemia, hypothyroidism, ovarian cyst, depression, and obesity, who reports snoring and excessive daytime somnolence.  She has recently started seeing Dr. Krista Blue in this office for her headaches. She was felt to have probably a combination type headache including migraine, headaches from untreated obstructive sleep apnea, and medication rebound headaches.  She had a brain MRI that showed small vessel disease.  The patient has been on Topamax for headache prevention and Imitrex as needed as well as Zofran and tizanidine.  ?I reviewed your office note from 01/20/2021.  Her Epworth sleepiness score is 16/24, fatigue severity score is 16 out of 63.  She reports a family history of sleep apnea affecting her mom. She reports increasing daytime somnolence in the past few years.  She has had weight fluctuations up to 30 pounds up or down.  Her headaches are little better, she has been on low-dose topiramate, 50 mg at bedtime.  She does not currently drink any caffeine especially after starting the topiramate, soda tasted not as good any longer although she does crave it sometimes.  She quit smoking some 10 years ago, she drinks alcohol rarely.  She tries to be in bed between 10 and 11 and rise time is around 6.  She works for a [employer], 3 days from home and 2 days of office.  She does not  have a long commute that has had some sleepiness at the wheel, never dozed off.  She never used it and now feels like she could manage even daily.  She does recall being sleepy as a child even.  She has not frequent morning headaches but lately, probably after starting her blood pressure medication.  The Topamax her headaches are not as severe  or frequent.  She had a change in her blood pressure medication as she had low potassium.  She is normal on Bystolic.  She still takes potassium supplements.  She has been on Cymbalta and Wellbutrin.  She has been on thyroid medication, she had a total thyroidectomy under Dr. Redmond Baseman.  She lives with her husband and her younger daughter, her older daughter is in college.  They have 1 dog and 1 hamster in the household, neither 1 sleeps in her bedroom.  She does have a TV in her bedroom but does not watch at night. ? ?Update 07/24/2021 SS: Holly Mcmillan here today for initial CPAP follow-up. HST 04/10/2021, showing OSA in moderate range. Started use in Dec, did well in Jan, mid Feb, her nose was bleeding the next morning, was small amount, got a sore in her nose, increased humidifier Using full face was leaking, not fitting well. Then taking it off during the night, was not aware. She tried getting new full face mask, was leaking, pulling it off. Headaches were better, but that could be related to Topamax. She stopped Bystolic due to concern for fatigue. Really hasn't had headache since starting Topamax, until the last 3 days. Didn't complete ESS, FSS was 56. ? ?Review of 90-day compliance 04/25/2021-07/23/2021, shows suboptimal usage 50/90 days at 56%, greater than 4 hours 35 days at 39%.  Minimum pressure 5, maximum pressure 12, EPR level 3.  Leak in the 95th percentile was 11.4, AHI 0.3. ? ?REVIEW OF SYSTEMS: Out of a complete 14 system review of symptoms, the patient complains only of the following symptoms, and all other reviewed systems are negative. ? ?See HPI ? ?ALLERGIES: ?Allergies  ?Allergen Reactions  ? Spironolactone   ?  Hair loss  ? Sulfonamide Derivatives Hives  ? ? ?HOME MEDICATIONS: ?Outpatient Medications Prior to Visit  ?Medication Sig Dispense Refill  ? buPROPion (WELLBUTRIN XL) 300 MG 24 hr tablet TAKE 1 TABLET BY MOUTH EVERY DAY 30 tablet 5  ? DULoxetine (CYMBALTA) 60 MG capsule TAKE 1 CAPSULE BY MOUTH  EVERY DAY 30 capsule 5  ? estradiol (ESTRACE) 2 MG tablet Take 2-4 mg by mouth daily.    ? fluticasone (FLONASE) 50 MCG/ACT nasal spray SPRAY 2 SPRAYS INTO EACH NOSTRIL EVERY DAY 16 mL 5  ? Insulin Pen Needle 32G X 6 MM MISC 1 Act by Does not apply route daily. 100 each 1  ? KLOR-CON M20 20 MEQ tablet TAKE 1 TABLET BY MOUTH EVERY DAY 30 tablet 5  ? levothyroxine (SYNTHROID) 150 MCG tablet TAKE 1 TABLET BY MOUTH EVERY DAY 30 tablet 2  ? Liraglutide -Weight Management (SAXENDA) 18 MG/3ML SOPN Inject 3 mg into the skin daily. 45 mL 1  ? ondansetron (ZOFRAN ODT) 4 MG disintegrating tablet Take 1 tablet (4 mg total) by mouth every 8 (eight) hours as needed for nausea or vomiting. 20 tablet 6  ? progesterone (PROMETRIUM) 100 MG capsule Take 100 mg by mouth at bedtime.    ? SUMAtriptan (IMITREX) 50 MG tablet Take 1 tablet (50 mg total) by mouth every 2 (two) hours as needed for migraine. May  repeat in 2 hours if headache persists or recurs. 12 tablet 6  ? tiZANidine (ZANAFLEX) 4 MG tablet Take 1 tablet (4 mg total) by mouth every 6 (six) hours as needed for muscle spasms. 30 tablet 6  ? topiramate (TOPAMAX) 100 MG tablet Take 1 tablet (100 mg total) by mouth 2 (two) times daily. 60 tablet 6  ? valACYclovir (VALTREX) 1000 MG tablet TAKE 2 TABLETS BY MOUTH 2 TIMES DAILY FOR 2 DOSES. 4 tablet 2  ? nebivolol (BYSTOLIC) 5 MG tablet TAKE 1 TABLET (5 MG TOTAL) BY MOUTH DAILY. 30 tablet 2  ? ?No facility-administered medications prior to visit.  ? ? ?PAST MEDICAL HISTORY: ?Past Medical History:  ?Diagnosis Date  ? Arthritis   ? Depression   ? Dysmenorrhea   ? Gallbladder disease   ? Headache(784.0)   ? Hyperlipidemia   ? Hypertension   ? Hypothyroidism   ? Obesity   ? Ovarian cyst   ? ? ?PAST SURGICAL HISTORY: ?Past Surgical History:  ?Procedure Laterality Date  ? ablation    ? BREAST CYST EXCISION Right 06/24/2016  ? Procedure: EXCISION RIGHT BREAST CYST;  Surgeon: Coralie Keens, MD;  Location: [facility];   Service: General;  Laterality: Right;  ? CESAREAN SECTION  2002, 2009  ? CHOLECYSTECTOMY  2005  ? NASAL SINUS SURGERY    ? THYROID SURGERY  2012  ? thyroidectomy  ? ? ?FAMILY HISTORY: ?Family History  ?Problem R

## 2021-08-05 ENCOUNTER — Telehealth: Payer: Self-pay

## 2021-08-05 NOTE — Telephone Encounter (Signed)
Key: [id number] ?Approved ?07/06/2021 - 12/03/2021 ?

## 2021-08-07 ENCOUNTER — Ambulatory Visit: Payer: 59 | Admitting: Neurology

## 2021-08-19 ENCOUNTER — Other Ambulatory Visit: Payer: Self-pay | Admitting: Internal Medicine

## 2021-08-19 ENCOUNTER — Encounter: Payer: Self-pay | Admitting: Internal Medicine

## 2021-08-19 DIAGNOSIS — F325 Major depressive disorder, single episode, in full remission: Secondary | ICD-10-CM

## 2021-08-19 MED ORDER — BUPROPION HCL ER (XL) 300 MG PO TB24: 300.0000 mg | ORAL_TABLET | Freq: Every day | ORAL | 5 refills | Status: DC

## 2021-08-27 ENCOUNTER — Telehealth (INDEPENDENT_AMBULATORY_CARE_PROVIDER_SITE_OTHER): Payer: 59 | Admitting: Neurology

## 2021-08-27 ENCOUNTER — Telehealth: Payer: Self-pay | Admitting: Neurology

## 2021-08-27 DIAGNOSIS — G4733 Obstructive sleep apnea (adult) (pediatric): Secondary | ICD-10-CM | POA: Diagnosis not present

## 2021-08-27 NOTE — Telephone Encounter (Signed)
FYI, patient was scheduled for a virtual visit today, for CPAP follow-up, she has a card download, will be bringing her CPAP by tomorrow between 11 and 12, once I see a download, I will call her to discuss, I will talk with her in person tomorrow also if needed. Thanks!! ?

## 2021-08-27 NOTE — Progress Notes (Signed)
? ?  Virtual Visit via Video Note ? ?I connected with Holly Mcmillan on 08/27/21 at  2:30 PM EDT by a video enabled telemedicine application and verified that I am speaking with the correct person using two identifiers. ? ?Location: ?Patient: in her car ?Provider: in the office  ?  ?I discussed the limitations of evaluation and management by telemedicine and the availability of in person appointments. The patient expressed understanding and agreed to proceed. ? ?History of Present Illness: ?Update 07/24/2021 SS: [name] here today for initial CPAP follow-up. HST 04/10/2021, showing OSA in moderate range. Started use in Dec, did well in Jan, mid Feb, her nose was bleeding the next morning, was small amount, got a sore in her nose, increased humidifier Using full face was leaking, not fitting well. Then taking it off during the night, was not aware. She tried getting new full face mask, was leaking, pulling it off. Headaches were better, but that could be related to Topamax. She stopped Bystolic due to concern for fatigue. Really hasn't had headache since starting Topamax, until the last 3 days. Didn't complete ESS, FSS was 56. ?  ?Review of 90-day compliance 04/25/2021-07/23/2021, shows suboptimal usage 50/90 days at 56%, greater than 4 hours 35 days at 39%.  Minimum pressure 5, maximum pressure 12, EPR level 3.  Leak in the 95th percentile was 11.4, AHI 0.3. ? ?08/27/2021 SS: Headaches are much better, has stopped Topamax, 1st time in weeks feels like herself again being off Topamax and Bystolic, for 2 weeks. Wearing CPAP nightly, takes it off most nights because tangled, or air blowing in her eyes. Not sue she is feeling better from CPAP, but headaches are better. In morning, gets up easier.  ? ?She had to bring her machine in for CPAP download (07/29/21-08/27/21) ? Review of the last 30 days shows overall compliance 25/30 days at 83%, greater than 4-hour usage is suboptimal at 8 days or 27%.  Average usage total days used  was 2 hours and 46-minute.  Leak in the 95th percentile was 12.6, AHI was 0.2.  Minimum pressure 5, maximum pressure 12 cmH2O, EPR level 3. Using full facemask. Doesn't like the mask tight, takes it off at night because bothering her. Does believe she feels better with it.  ?  ?Observations/Objective: ?Via virtual visit, is alert and oriented, in her car, moves upper extremities, facial symmetry noted ? ?Assessment and Plan: ?1.  OSA on CPAP ?2.  Frequent morning headache, migraine history ?-Stopped Topamax, migraines are doing great ?-CPAP download indicates overall suboptimal compliance, I will order a mask refit, is having trouble with the fullface mask, will discuss with DME other options for better fit and tolerance, she will MyChart message when she gets a new mask, we can then check a download in the next 2 to 4 weeks to see how things are looking ? ?Follow Up Instructions: ?Check in via my chart ?  ?I discussed the assessment and treatment plan with the patient. The patient was provided an opportunity to ask questions and all were answered. The patient agreed with the plan and demonstrated an understanding of the instructions. ?  ?The patient was advised to call back or seek an in-person evaluation if the symptoms worsen or if the condition fails to improve as anticipated. ? ?Butler Denmark, AGNP-C, DNP  ?[facility] ?[address], [address] ?[place], [place] [address] ?([phone] ? ?

## 2021-08-28 ENCOUNTER — Encounter: Payer: Self-pay | Admitting: Neurology

## 2021-09-18 ENCOUNTER — Ambulatory Visit (INDEPENDENT_AMBULATORY_CARE_PROVIDER_SITE_OTHER): Payer: 59 | Admitting: Internal Medicine

## 2021-09-18 ENCOUNTER — Encounter: Payer: Self-pay | Admitting: Internal Medicine

## 2021-09-18 VITALS — BP 128/86 | HR 67 | Temp 98.1°F | Ht 62.0 in | Wt 213.0 lb

## 2021-09-18 DIAGNOSIS — I2510 Atherosclerotic heart disease of native coronary artery without angina pectoris: Secondary | ICD-10-CM | POA: Diagnosis not present

## 2021-09-18 DIAGNOSIS — M67479 Ganglion, unspecified ankle and foot: Secondary | ICD-10-CM | POA: Insufficient documentation

## 2021-09-18 DIAGNOSIS — I10 Essential (primary) hypertension: Secondary | ICD-10-CM

## 2021-09-18 DIAGNOSIS — E039 Hypothyroidism, unspecified: Secondary | ICD-10-CM

## 2021-09-18 DIAGNOSIS — Z0001 Encounter for general adult medical examination with abnormal findings: Secondary | ICD-10-CM | POA: Diagnosis not present

## 2021-09-18 LAB — BASIC METABOLIC PANEL
BUN: 14 mg/dL (ref 6–23)
CO2: 29 mEq/L (ref 19–32)
Calcium: 9.2 mg/dL (ref 8.4–10.5)
Chloride: 103 mEq/L (ref 96–112)
Creatinine, Ser: 0.83 mg/dL (ref 0.40–1.20)
GFR: 81.16 mL/min (ref >60.00)
Glucose, Bld: 82 mg/dL (ref 70–99)
Potassium: 4.6 mEq/L (ref 3.5–5.1)
Sodium: 139 mEq/L (ref 135–145)

## 2021-09-18 LAB — TSH: TSH: 0.7 u[IU]/mL (ref 0.35–5.50)

## 2021-09-18 NOTE — Progress Notes (Signed)
Subjective:  Patient ID: [name], female    DOB: 1970/11/20  Age: 51 y.o. MRN: [id number]  CC: Hypertension, Hypothyroidism, and Annual Exam   HPI TREY BEBEE presents for f/up -  Se is active and denies DOE, diaphoresis, edema, dizziness, or lightheadedness.    Outpatient Medications Prior to Visit  Medication Sig Dispense Refill   buPROPion (WELLBUTRIN XL) 300 MG 24 hr tablet Take 1 tablet (300 mg total) by mouth daily. 30 tablet 5   DULoxetine (CYMBALTA) 60 MG capsule TAKE 1 CAPSULE BY MOUTH EVERY DAY 30 capsule 5   estradiol (ESTRACE) 2 MG tablet Take 2-4 mg by mouth daily.     Insulin Pen Needle 32G X 6 MM MISC 1 Act by Does not apply route daily. 100 each 1   KLOR-CON M20 20 MEQ tablet TAKE 1 TABLET BY MOUTH EVERY DAY 30 tablet 5   levothyroxine (SYNTHROID) 150 MCG tablet TAKE 1 TABLET BY MOUTH EVERY DAY 30 tablet 2   Liraglutide -Weight Management (SAXENDA) 18 MG/3ML SOPN Inject 3 mg into the skin daily. 45 mL 1   ondansetron (ZOFRAN ODT) 4 MG disintegrating tablet Take 1 tablet (4 mg total) by mouth every 8 (eight) hours as needed for nausea or vomiting. 20 tablet 6   progesterone (PROMETRIUM) 100 MG capsule Take 100 mg by mouth at bedtime.     SUMAtriptan (IMITREX) 50 MG tablet Take 1 tablet (50 mg total) by mouth every 2 (two) hours as needed for migraine. May repeat in 2 hours if headache persists or recurs. 12 tablet 6   tiZANidine (ZANAFLEX) 4 MG tablet Take 1 tablet (4 mg total) by mouth every 6 (six) hours as needed for muscle spasms. 30 tablet 6   valACYclovir (VALTREX) 1000 MG tablet TAKE 2 TABLETS BY MOUTH 2 TIMES DAILY FOR 2 DOSES. 4 tablet 2   fluticasone (FLONASE) 50 MCG/ACT nasal spray SPRAY 2 SPRAYS INTO EACH NOSTRIL EVERY DAY 16 mL 5   topiramate (TOPAMAX) 100 MG tablet Take 1 tablet (100 mg total) by mouth 2 (two) times daily. 60 tablet 6   No facility-administered medications prior to visit.    ROS Review of Systems  Constitutional:  Negative for  chills, diaphoresis, fatigue and fever.  HENT: Negative.    Eyes: Negative.   Respiratory:  Negative for chest tightness, shortness of breath and wheezing.   Cardiovascular:  Positive for chest pain. Negative for palpitations and leg swelling.       Intermittent sharp CP  Gastrointestinal:  Negative for abdominal pain, constipation, diarrhea, nausea and vomiting.  Endocrine: Negative.   Genitourinary: Negative.  Negative for difficulty urinating.  Musculoskeletal: Negative.   Skin: Negative.        Growing lesion on top of left toe for several years  Neurological:  Negative for dizziness, weakness, light-headedness and headaches.  Hematological:  Negative for adenopathy. Does not bruise/bleed easily.  Psychiatric/Behavioral: Negative.     Objective:  BP 128/86 (BP Location: Left Arm, Patient Position: Sitting, Cuff Size: Large)   Pulse 67   Temp 98.1 F (36.7 C) (Oral)   Ht '5\' 2"'$  (1.575 m)   Wt 213 lb (96.6 kg)   SpO2 96%   BMI 38.96 kg/m   BP Readings from Last 3 Encounters:  09/18/21 128/86  07/24/21 133/90  04/10/21 134/86    Wt Readings from Last 3 Encounters:  09/18/21 213 lb (96.6 kg)  07/24/21 211 lb 8 oz (95.9 kg)  04/10/21 216 lb (  98 kg)    Physical Exam Vitals reviewed.  Constitutional:      Appearance: Normal appearance.  HENT:     Nose: Nose normal.     Mouth/Throat:     Mouth: Mucous membranes are moist.  Eyes:     General: No scleral icterus.    Conjunctiva/sclera: Conjunctivae normal.  Cardiovascular:     Rate and Rhythm: Normal rate and regular rhythm.     Heart sounds: No murmur heard. Pulmonary:     Effort: Pulmonary effort is normal.     Breath sounds: No stridor. No wheezing, rhonchi or rales.  Abdominal:     General: Abdomen is flat.     Palpations: There is no mass.     Tenderness: There is no abdominal tenderness. There is no guarding.     Hernia: No hernia is present.  Musculoskeletal:     Cervical back: Neck supple.   Lymphadenopathy:     Cervical: No cervical adenopathy.  Skin:    General: Skin is warm and dry.  Neurological:     General: No focal deficit present.     Mental Status: She is alert. Mental status is at baseline.  Psychiatric:        Mood and Affect: Mood normal.    Lab Results  Component Value Date   WBC 10.8 (H) 04/10/2021   HGB 12.9 04/10/2021   HCT 39.2 04/10/2021   PLT 292.0 04/10/2021   GLUCOSE 82 09/18/2021   CHOL 251 (H) 06/07/2020   TRIG 123.0 06/07/2020   HDL 63.20 06/07/2020   LDLDIRECT 173.0 10/25/2018   LDLCALC 163 (H) 06/07/2020   ALT 23 01/20/2021   AST 18 01/20/2021   NA 139 09/18/2021   K 4.6 09/18/2021   CL 103 09/18/2021   CREATININE 0.83 09/18/2021   BUN 14 09/18/2021   CO2 29 09/18/2021   TSH 0.70 09/18/2021   HGBA1C 5.5 03/16/2016    CT CHEST LUNG CA SCREEN LOW DOSE W/O CM  Result Date: 06/30/2021 CLINICAL DATA:  51 year old female former smoker (quit in 2021) with 24 pack-year history of smoking. Lung cancer screening examination. EXAM: CT CHEST WITHOUT CONTRAST LOW-DOSE FOR LUNG CANCER SCREENING TECHNIQUE: Multidetector CT imaging of the chest was performed following the standard protocol without IV contrast. RADIATION DOSE REDUCTION: This exam was performed according to the departmental dose-optimization program which includes automated exposure control, adjustment of the mA and/or kV according to patient size and/or use of iterative reconstruction technique. COMPARISON:  Low-dose lung cancer screening chest CT 03/12/2004. FINDINGS: Cardiovascular: Heart size is normal. There is no significant pericardial fluid, thickening or pericardial calcification. There is aortic atherosclerosis, as well as atherosclerosis of the great vessels of the mediastinum and the coronary arteries, including calcified atherosclerotic plaque in the left anterior descending coronary artery. Mediastinum/Nodes: No pathologically enlarged mediastinal or hilar lymph nodes. Please  note that accurate exclusion of hilar adenopathy is limited on noncontrast CT scans. Esophagus is unremarkable in appearance. No axillary lymphadenopathy. Lungs/Pleura: No suspicious appearing pulmonary nodules or masses are noted. No acute consolidative airspace disease. No pleural effusions. Upper Abdomen: Status post cholecystectomy. Musculoskeletal: There are no aggressive appearing lytic or blastic lesions noted in the visualized portions of the skeleton. IMPRESSION: 1. Lung-RADS 1S, negative. Continue annual screening with low-dose chest CT without contrast in 12 months. 2. The "S" modifier above refers to potentially clinically significant non lung cancer related findings. Specifically, there is aortic atherosclerosis, in addition to left anterior descending coronary artery disease. Please  note that although the presence of coronary artery calcium documents the presence of coronary artery disease, the severity of this disease and any potential stenosis cannot be assessed on this non-gated CT examination. Assessment for potential risk factor modification, dietary therapy or pharmacologic therapy may be warranted, if clinically indicated. Aortic Atherosclerosis (ICD10-I70.0). Electronically Signed   By: Vinnie Langton M.D.   On: 06/30/2021 07:20   Assessment & Plan:   Kenlee was seen today for hypertension, hypothyroidism and annual exam.  Diagnoses and all orders for this visit:  Essential hypertension- Her blood pressure is adequately well controlled. -     Basic metabolic panel; Future -     Basic metabolic panel  Acquired hypothyroidism- Her TSH is in the normal range and she is euthyroid.  We will continue the current T4 dosage. -     TSH; Future -     TSH  Digital mucous cyst of toe -     Ambulatory referral to Podiatry  Encounter for general adult medical examination with abnormal findings- Exam completed, labs reviewed, vaccines are up-to-date, cancer screenings are up-to-date,  patient education was given.  Atherosclerosis of native coronary artery of native heart without angina pectoris -     CT CARDIAC SCORING (SELF PAY ONLY); Future   I have discontinued Saul Dorsi. Poer's fluticasone and topiramate. I am also having her maintain her progesterone, estradiol, valACYclovir, SUMAtriptan, ondansetron, tiZANidine, Saxenda, Insulin Pen Needle, DULoxetine, Klor-Con M20, levothyroxine, and buPROPion.  No orders of the defined types were placed in this encounter.    Follow-up: Return in about 6 months (around 03/21/2022).  Scarlette Calico, MD

## 2021-09-18 NOTE — Patient Instructions (Signed)

## 2021-09-21 ENCOUNTER — Other Ambulatory Visit: Payer: Self-pay | Admitting: Internal Medicine

## 2021-09-21 DIAGNOSIS — M159 Polyosteoarthritis, unspecified: Secondary | ICD-10-CM

## 2021-09-21 DIAGNOSIS — F325 Major depressive disorder, single episode, in full remission: Secondary | ICD-10-CM

## 2021-10-02 ENCOUNTER — Ambulatory Visit (INDEPENDENT_AMBULATORY_CARE_PROVIDER_SITE_OTHER): Payer: 59 | Admitting: Podiatry

## 2021-10-02 ENCOUNTER — Encounter: Payer: Self-pay | Admitting: Podiatry

## 2021-10-02 DIAGNOSIS — M6740 Ganglion, unspecified site: Secondary | ICD-10-CM

## 2021-10-02 DIAGNOSIS — L603 Nail dystrophy: Secondary | ICD-10-CM | POA: Diagnosis not present

## 2021-10-02 DIAGNOSIS — D2372 Other benign neoplasm of skin of left lower limb, including hip: Secondary | ICD-10-CM

## 2021-10-04 NOTE — Progress Notes (Signed)
Subjective:  Patient ID: Holly Mcmillan, female    DOB: Mar 27, 1971,  MRN: [id number] HPI Chief Complaint  Patient presents with   Foot Pain    Sub 5th MPJ left - small, callused area, tender walking   Toe Pain    2nd toe left - thought initially was a blister, ongoing x several months and clear liquid keeps draining from it   New Patient (Initial Visit)    Est pt 2016    51 y.o. female presents with the above complaint.   ROS: Denies fever chills nausea vomiting muscle aches pains calf pain back pain chest pain shortness of breath.  Past Medical History:  Diagnosis Date   Arthritis    Depression    Dysmenorrhea    Gallbladder disease    Headache(784.0)    Hyperlipidemia    Hypertension    Hypothyroidism    Obesity    Ovarian cyst    Past Surgical History:  Procedure Laterality Date   ablation     BREAST CYST EXCISION Right 06/24/2016   Procedure: EXCISION RIGHT BREAST CYST;  Surgeon: Coralie Keens, MD;  Location: [facility];  Service: General;  Laterality: Right;   CESAREAN SECTION  2002, 2009   CHOLECYSTECTOMY  2005   NASAL SINUS SURGERY     THYROID SURGERY  2012   thyroidectomy    Current Outpatient Medications:    buPROPion (WELLBUTRIN XL) 300 MG 24 hr tablet, Take 1 tablet (300 mg total) by mouth daily., Disp: 30 tablet, Rfl: 5   DULoxetine (CYMBALTA) 60 MG capsule, TAKE 1 CAPSULE BY MOUTH EVERY DAY, Disp: 30 capsule, Rfl: 5   estradiol (ESTRACE) 2 MG tablet, Take 2-4 mg by mouth daily., Disp: , Rfl:    HYDROcodone-acetaminophen (NORCO/VICODIN) 5-325 MG tablet, SMARTSIG:1 Tablet(s) By Mouth 1-3 Times Daily PRN, Disp: , Rfl:    Insulin Pen Needle 32G X 6 MM MISC, 1 Act by Does not apply route daily., Disp: 100 each, Rfl: 1   ipratropium (ATROVENT) 0.06 % nasal spray, Place 2 sprays into both nostrils 3 (three) times daily., Disp: , Rfl:    KLOR-CON M20 20 MEQ tablet, TAKE 1 TABLET BY MOUTH EVERY DAY, Disp: 30 tablet, Rfl: 5   levothyroxine  (SYNTHROID) 150 MCG tablet, TAKE 1 TABLET BY MOUTH EVERY DAY, Disp: 30 tablet, Rfl: 2   Liraglutide -Weight Management (SAXENDA) 18 MG/3ML SOPN, Inject 3 mg into the skin daily., Disp: 45 mL, Rfl: 1   ondansetron (ZOFRAN ODT) 4 MG disintegrating tablet, Take 1 tablet (4 mg total) by mouth every 8 (eight) hours as needed for nausea or vomiting., Disp: 20 tablet, Rfl: 6   progesterone (PROMETRIUM) 100 MG capsule, Take 100 mg by mouth at bedtime., Disp: , Rfl:    progesterone (PROMETRIUM) 100 MG capsule, Take 100 mg by mouth daily., Disp: , Rfl:    SUMAtriptan (IMITREX) 50 MG tablet, Take 1 tablet (50 mg total) by mouth every 2 (two) hours as needed for migraine. May repeat in 2 hours if headache persists or recurs., Disp: 12 tablet, Rfl: 6   tiZANidine (ZANAFLEX) 4 MG tablet, Take 1 tablet (4 mg total) by mouth every 6 (six) hours as needed for muscle spasms., Disp: 30 tablet, Rfl: 6   valACYclovir (VALTREX) 1000 MG tablet, TAKE 2 TABLETS BY MOUTH 2 TIMES DAILY FOR 2 DOSES., Disp: 4 tablet, Rfl: 2  Allergies  Allergen Reactions   Spironolactone     Hair loss   Sulfonamide Derivatives Hives  Review of Systems Objective:  There were no vitals filed for this visit.  General: Well developed, nourished, in no acute distress, alert and oriented x3   Dermatological: Skin is warm, dry and supple bilateral. Nails x 10 are well maintained; remaining integument appears unremarkable at this time. There are no open sores, no preulcerative lesions, no rash or signs of infection present.  Also appears that she may have developed some nail dystrophy to the first and fifth nails bilaterally.  She also has a painful reactive keratotic lesion plantar fifth metatarsal mid diaphyseal region of her left foot.  It is not open.  She also has what appears to be a mucoid cyst second DIPJ left foot currently it is healed  Vascular: Dorsalis Pedis artery and Posterior Tibial artery pedal pulses are 2/4 bilateral with  immedate capillary fill time. Pedal hair growth present. No varicosities and no lower extremity edema present bilateral.   Neruologic: Grossly intact via light touch bilateral. Vibratory intact via tuning fork bilateral. Protective threshold with Semmes Wienstein monofilament intact to all pedal sites bilateral. Patellar and Achilles deep tendon reflexes 2+ bilateral. No Babinski or clonus noted bilateral.   Musculoskeletal: No gross boney pedal deformities bilateral. No pain, crepitus, or limitation noted with foot and ankle range of motion bilateral. Muscular strength 5/5 in all groups tested bilateral.  Gait: Unassisted, Nonantalgic.    Radiographs:  None taken  Assessment & Plan:   Assessment: Mucoid cyst second left.,  Porokeratosis benign skin lesion.,  And nail dystrophy bilateral.  Plan: Samples of the skin and nail were taken today for pathologic evaluation I will follow-up with her for these results in 1 month.  I also debrided the benign skin lesion.  We provided chemical destruction of this lesion instructed her on how to care for this follow-up with her for the pathology results.  We will reevaluate this lesion at that time     [name] T. Skene, [place]

## 2021-10-16 ENCOUNTER — Other Ambulatory Visit: Payer: Self-pay | Admitting: Nurse Practitioner

## 2021-10-16 ENCOUNTER — Ambulatory Visit
Admission: RE | Admit: 2021-10-16 | Discharge: 2021-10-16 | Disposition: A | Discharge status: Home / Self Care | Payer: 59 | Source: Ambulatory Visit | Attending: Nurse Practitioner | Admitting: Nurse Practitioner

## 2021-10-16 DIAGNOSIS — M549 Dorsalgia, unspecified: Secondary | ICD-10-CM

## 2021-10-17 ENCOUNTER — Other Ambulatory Visit: Payer: Self-pay | Admitting: Internal Medicine

## 2021-10-17 DIAGNOSIS — E039 Hypothyroidism, unspecified: Secondary | ICD-10-CM

## 2021-10-26 ENCOUNTER — Other Ambulatory Visit: Payer: Self-pay | Admitting: Internal Medicine

## 2021-11-03 ENCOUNTER — Other Ambulatory Visit (HOSPITAL_BASED_OUTPATIENT_CLINIC_OR_DEPARTMENT_OTHER): Payer: 59

## 2021-11-05 ENCOUNTER — Telehealth: Payer: Self-pay | Admitting: *Deleted

## 2021-11-05 NOTE — Telephone Encounter (Signed)
Pt was on cover-my-meds.. Need PA on [employer]. Submitted PA w/ (Key: [id number]). Rec;d instant msg back stating " This request has been approved. Effective 10/06/2021; Coverage End Date:03/05/2022. Faxing approval to pof.Marland KitchenJohny Chess

## 2021-11-12 ENCOUNTER — Ambulatory Visit (HOSPITAL_BASED_OUTPATIENT_CLINIC_OR_DEPARTMENT_OTHER): Payer: 59

## 2021-11-16 ENCOUNTER — Other Ambulatory Visit: Payer: Self-pay | Admitting: Internal Medicine

## 2021-11-16 DIAGNOSIS — F325 Major depressive disorder, single episode, in full remission: Secondary | ICD-10-CM

## 2021-11-16 DIAGNOSIS — M159 Polyosteoarthritis, unspecified: Secondary | ICD-10-CM

## 2021-11-18 ENCOUNTER — Ambulatory Visit: Payer: 59 | Admitting: Podiatry

## 2021-11-19 ENCOUNTER — Telehealth: Payer: Self-pay

## 2021-11-19 NOTE — Telephone Encounter (Signed)
Key: [id number]  An active PA is already on file with expiration date of 03/05/2022. Please wait to resubmit request within 60 days of that expiration date to obtain a PA renewal.

## 2021-11-24 ENCOUNTER — Ambulatory Visit (INDEPENDENT_AMBULATORY_CARE_PROVIDER_SITE_OTHER): Payer: 59 | Admitting: Podiatry

## 2021-11-24 DIAGNOSIS — L603 Nail dystrophy: Secondary | ICD-10-CM

## 2021-11-24 DIAGNOSIS — Z79899 Other long term (current) drug therapy: Secondary | ICD-10-CM | POA: Diagnosis not present

## 2021-11-24 DIAGNOSIS — M6740 Ganglion, unspecified site: Secondary | ICD-10-CM | POA: Diagnosis not present

## 2021-11-24 MED ORDER — TERBINAFINE HCL 250 MG PO TABS
250.0000 mg | ORAL_TABLET | Freq: Every day | ORAL | 0 refills | Status: DC
Start: 2021-11-24 — End: 2022-01-29

## 2021-11-24 NOTE — Progress Notes (Signed)
She presents today for follow-up of her mucoid cyst of her second toe.  She states that it is the same since last visit it is no constant pain but the pain just varies in its intensity.  And she refers to the second digit of her left foot.  She states there is some swelling around the blistered part of the cyst but no discharge recently.  She is also here for her nail pathology.  Objective: Vital signs are stable she alert oriented x3 no change in physical exam regarding the second toe or her dystrophic nails.  Pulses are strong and palpable.  Pathology does demonstrate onychomycosis.  Assessment: Onychomycosis and nail dystrophy with mucoid cyst second digit left foot.  Plan: Discussed etiology pathology and surgical therapies at this point time went ahead and consented her for a DIPJ arthroplasty and excision of cyst.  This will be sent for pathologic evaluation.  We did discuss possible postop complications in time to heal with this she understands and is amenable to it.  We also discussed in great detail today oral therapy versus topical therapy versus laser therapy.  She like to try oral therapy.  So we will put her on Lamisil No. 30 tablets 1 p.o. daily follow-up with her in 1 month.  We did discuss possible side effects and complications associated with this medicine she understands and is amenable to it.

## 2021-11-27 ENCOUNTER — Ambulatory Visit (HOSPITAL_BASED_OUTPATIENT_CLINIC_OR_DEPARTMENT_OTHER)
Admission: RE | Admit: 2021-11-27 | Discharge: 2021-11-27 | Disposition: A | Discharge status: Home / Self Care | Payer: 59 | Source: Ambulatory Visit | Attending: Internal Medicine | Admitting: Internal Medicine

## 2021-11-27 DIAGNOSIS — I2510 Atherosclerotic heart disease of native coronary artery without angina pectoris: Secondary | ICD-10-CM | POA: Insufficient documentation

## 2021-11-30 ENCOUNTER — Other Ambulatory Visit: Payer: Self-pay | Admitting: Internal Medicine

## 2021-12-21 ENCOUNTER — Other Ambulatory Visit: Payer: Self-pay | Admitting: Internal Medicine

## 2021-12-21 ENCOUNTER — Other Ambulatory Visit: Payer: Self-pay | Admitting: Podiatry

## 2021-12-21 DIAGNOSIS — B001 Herpesviral vesicular dermatitis: Secondary | ICD-10-CM

## 2021-12-26 ENCOUNTER — Telehealth: Payer: Self-pay | Admitting: Urology

## 2021-12-26 NOTE — Telephone Encounter (Signed)
DOS - 01/23/22  HAMMERTOE REPAIR 2ND LEFT --- [id number]   [employer] EFFECTIVE DATE -    PER [employer] FOR CPT CODE 79024 NO PRIOR AUTH IS REQUIRED.  REF # [id number]

## 2022-01-12 ENCOUNTER — Other Ambulatory Visit: Payer: Self-pay | Admitting: Internal Medicine

## 2022-01-12 DIAGNOSIS — E039 Hypothyroidism, unspecified: Secondary | ICD-10-CM

## 2022-01-12 DIAGNOSIS — I10 Essential (primary) hypertension: Secondary | ICD-10-CM

## 2022-01-12 DIAGNOSIS — E876 Hypokalemia: Secondary | ICD-10-CM

## 2022-01-22 ENCOUNTER — Other Ambulatory Visit: Payer: Self-pay | Admitting: Podiatry

## 2022-01-22 ENCOUNTER — Other Ambulatory Visit: Payer: Self-pay | Admitting: Nurse Practitioner

## 2022-01-22 DIAGNOSIS — M5116 Intervertebral disc disorders with radiculopathy, lumbar region: Secondary | ICD-10-CM

## 2022-01-22 MED ORDER — ONDANSETRON HCL 4 MG PO TABS: 4.0000 mg | ORAL_TABLET | Freq: Three times a day (TID) | ORAL | 0 refills | Status: DC | PRN

## 2022-01-22 MED ORDER — HYDROCODONE-ACETAMINOPHEN 10-325 MG PO TABS: 1.0000 | ORAL_TABLET | Freq: Four times a day (QID) | ORAL | 0 refills | Status: AC | PRN

## 2022-01-22 MED ORDER — CEPHALEXIN 500 MG PO CAPS: 500.0000 mg | ORAL_CAPSULE | Freq: Three times a day (TID) | ORAL | 0 refills | Status: DC

## 2022-01-23 DIAGNOSIS — M67472 Ganglion, left ankle and foot: Secondary | ICD-10-CM | POA: Diagnosis not present

## 2022-01-23 DIAGNOSIS — M2042 Other hammer toe(s) (acquired), left foot: Secondary | ICD-10-CM | POA: Diagnosis not present

## 2022-01-29 ENCOUNTER — Encounter: Payer: Self-pay | Admitting: Podiatry

## 2022-01-29 ENCOUNTER — Ambulatory Visit (INDEPENDENT_AMBULATORY_CARE_PROVIDER_SITE_OTHER): Payer: 59 | Admitting: Podiatry

## 2022-01-29 ENCOUNTER — Ambulatory Visit (INDEPENDENT_AMBULATORY_CARE_PROVIDER_SITE_OTHER): Payer: 59

## 2022-01-29 DIAGNOSIS — M6740 Ganglion, unspecified site: Secondary | ICD-10-CM

## 2022-01-29 DIAGNOSIS — L603 Nail dystrophy: Secondary | ICD-10-CM

## 2022-01-29 DIAGNOSIS — Z79899 Other long term (current) drug therapy: Secondary | ICD-10-CM

## 2022-01-29 DIAGNOSIS — M205X2 Other deformities of toe(s) (acquired), left foot: Secondary | ICD-10-CM

## 2022-01-29 DIAGNOSIS — Z9889 Other specified postprocedural states: Secondary | ICD-10-CM

## 2022-01-29 MED ORDER — TERBINAFINE HCL 250 MG PO TABS
250.0000 mg | ORAL_TABLET | Freq: Every day | ORAL | 0 refills | Status: DC
Start: 2022-01-29 — End: 2022-02-17

## 2022-01-30 LAB — COMPREHENSIVE METABOLIC PANEL
AG Ratio: 1.4 (calc) (ref 1.0–2.5)
ALT: 24 U/L (ref 6–29)
AST: 22 U/L (ref 10–35)
Albumin: 4.4 g/dL (ref 3.6–5.1)
Alkaline phosphatase (APISO): 69 U/L (ref 37–153)
BUN: 17 mg/dL (ref 7–25)
CO2: 27 mmol/L (ref 20–32)
Calcium: 9.6 mg/dL (ref 8.6–10.4)
Chloride: 100 mmol/L (ref 98–110)
Creat: 0.73 mg/dL (ref 0.50–1.03)
Globulin: 3.1 g/dL (calc) (ref 1.9–3.7)
Glucose, Bld: 120 mg/dL — ABNORMAL HIGH (ref 65–99)
Potassium: 5 mmol/L (ref 3.5–5.3)
Sodium: 138 mmol/L (ref 135–146)
Total Bilirubin: 0.3 mg/dL (ref 0.2–1.2)
Total Protein: 7.5 g/dL (ref 6.1–8.1)

## 2022-01-31 NOTE — Progress Notes (Signed)
She presents today date of surgery 01/23/2022 distal arthroplasty with excision of mucoid cyst.  She states that is doing just fine with absolutely no pain whatsoever.  She also states that she had no problems taking her first 30 days of Lamisil which she ended at the end of August and she has not had anything during that time.  She denied fever chills nausea vomit muscle aches pains itching or rashes associated with the medication.  Objective: Vital signs are stable she is alert and oriented x3.  Pulses are palpable.  There is no erythema just some mild edema sutures are intact margins are well coapted toe is rectus.  Radiographs confirm arthroplasty in good position.  No change in nail plates as of yet.  Assessment: Onychomycosis long-term therapy with oral antifungal.  Also well-healing surgical toe x1 week.  Plan: Redressed today dressed a compressive dressing we are requesting blood work for the medication and we will follow-up with her in 1 week.  We did go ahead and refill her medicine.

## 2022-02-02 ENCOUNTER — Telehealth: Payer: Self-pay | Admitting: *Deleted

## 2022-02-02 NOTE — Telephone Encounter (Signed)
-----   Message from Garrel Ridgel, [place] sent at 01/31/2022 11:59 AM EDT ----- Blood work looks good.

## 2022-02-05 ENCOUNTER — Telehealth: Payer: Self-pay

## 2022-02-05 ENCOUNTER — Ambulatory Visit (INDEPENDENT_AMBULATORY_CARE_PROVIDER_SITE_OTHER): Payer: 59 | Admitting: Podiatry

## 2022-02-05 DIAGNOSIS — M205X2 Other deformities of toe(s) (acquired), left foot: Secondary | ICD-10-CM

## 2022-02-05 NOTE — Progress Notes (Signed)
She presents today date of surgery 01/23/2022 distal arthroplasty DIPJ second toe left foot with a biopsy of the cyst.  She states that she is doing well accidentally dropped something on her toe the other day and almost got the foot wet but it was a near miss she says.  Objective: Vital signs are stable alert and oriented x3.  Pulses are palpable.  Dressed her dressing intact was removed demonstrates no erythema to some mild edema no cellulitis drainage or odor sutures are intact once removed demonstrates margins remain well coapted.  Assessment: Well-healing surgical toe second left.  Plan: Once the sutures were removed today I demonstrated to her how to wrap the toe with Coflex and she will continue to do so.  I will follow-up with her in a couple of weeks at which time she can switch to regular shoes.  We need to discuss Lamisil with her at that time.

## 2022-02-05 NOTE — Telephone Encounter (Signed)
Key: [facility]  Approved 01/06/2022 - 06/05/2022

## 2022-02-08 ENCOUNTER — Other Ambulatory Visit: Payer: 59

## 2022-02-09 ENCOUNTER — Ambulatory Visit
Admission: RE | Admit: 2022-02-09 | Discharge: 2022-02-09 | Disposition: A | Discharge status: Home / Self Care | Payer: 59 | Source: Ambulatory Visit | Attending: Nurse Practitioner | Admitting: Nurse Practitioner

## 2022-02-09 DIAGNOSIS — M5116 Intervertebral disc disorders with radiculopathy, lumbar region: Secondary | ICD-10-CM

## 2022-02-12 ENCOUNTER — Other Ambulatory Visit: Payer: Self-pay | Admitting: Internal Medicine

## 2022-02-12 DIAGNOSIS — F325 Major depressive disorder, single episode, in full remission: Secondary | ICD-10-CM

## 2022-02-17 ENCOUNTER — Ambulatory Visit (INDEPENDENT_AMBULATORY_CARE_PROVIDER_SITE_OTHER): Payer: 59 | Admitting: Podiatry

## 2022-02-17 ENCOUNTER — Encounter: Payer: Self-pay | Admitting: Podiatry

## 2022-02-17 DIAGNOSIS — M6740 Ganglion, unspecified site: Secondary | ICD-10-CM

## 2022-02-17 DIAGNOSIS — M205X2 Other deformities of toe(s) (acquired), left foot: Secondary | ICD-10-CM

## 2022-02-17 MED ORDER — TERBINAFINE HCL 250 MG PO TABS
250.0000 mg | ORAL_TABLET | Freq: Every day | ORAL | 0 refills | Status: DC
Start: 2022-02-17 — End: 2022-04-21

## 2022-02-17 NOTE — Progress Notes (Signed)
She presents today for follow-up of her distal arthroplasty second digit left foot.  She states is doing just fine no problems whatsoever and she is still taking her 90 days of Lamisil.  Objective: Vital signs stable alert oriented x3 the toe appears to be perfect does not appear to have any issues whatsoever has gone on to heal uneventfully.  Nails have not changed as of yet.  Assessment: Well-healing surgical toe long-term therapy with Lamisil.  Plan: Continue the 90-day dose and we will follow-up with her in 3 months for this.

## 2022-03-05 ENCOUNTER — Encounter: Payer: 59 | Admitting: Podiatry

## 2022-03-08 ENCOUNTER — Encounter (HOSPITAL_BASED_OUTPATIENT_CLINIC_OR_DEPARTMENT_OTHER): Payer: Self-pay

## 2022-03-08 ENCOUNTER — Emergency Department (HOSPITAL_BASED_OUTPATIENT_CLINIC_OR_DEPARTMENT_OTHER): Payer: 59 | Admitting: Radiology

## 2022-03-08 DIAGNOSIS — Y998 Other external cause status: Secondary | ICD-10-CM | POA: Insufficient documentation

## 2022-03-08 DIAGNOSIS — W108XXA Fall (on) (from) other stairs and steps, initial encounter: Secondary | ICD-10-CM | POA: Insufficient documentation

## 2022-03-08 DIAGNOSIS — I10 Essential (primary) hypertension: Secondary | ICD-10-CM | POA: Insufficient documentation

## 2022-03-08 DIAGNOSIS — Y9289 Other specified places as the place of occurrence of the external cause: Secondary | ICD-10-CM | POA: Diagnosis not present

## 2022-03-08 DIAGNOSIS — M25511 Pain in right shoulder: Secondary | ICD-10-CM | POA: Diagnosis present

## 2022-03-08 DIAGNOSIS — Z79899 Other long term (current) drug therapy: Secondary | ICD-10-CM | POA: Insufficient documentation

## 2022-03-08 DIAGNOSIS — Y9389 Activity, other specified: Secondary | ICD-10-CM | POA: Insufficient documentation

## 2022-03-08 DIAGNOSIS — F17200 Nicotine dependence, unspecified, uncomplicated: Secondary | ICD-10-CM | POA: Diagnosis not present

## 2022-03-08 DIAGNOSIS — E039 Hypothyroidism, unspecified: Secondary | ICD-10-CM | POA: Insufficient documentation

## 2022-03-08 NOTE — ED Triage Notes (Signed)
[address] going down steps, missed 1 step and fell on to right shoulder. Has ice pack on shoulder.  C/O Pain 10/10  on shoulder towards shoulder blade.

## 2022-03-09 ENCOUNTER — Emergency Department (HOSPITAL_BASED_OUTPATIENT_CLINIC_OR_DEPARTMENT_OTHER)
Admission: EM | Admit: 2022-03-09 | Discharge: 2022-03-09 | Disposition: A | Discharge status: Home / Self Care | Payer: 59 | Attending: Emergency Medicine | Admitting: Emergency Medicine

## 2022-03-09 DIAGNOSIS — S4991XA Unspecified injury of right shoulder and upper arm, initial encounter: Secondary | ICD-10-CM

## 2022-03-09 MED ORDER — KETOROLAC TROMETHAMINE 60 MG/2ML IM SOLN
30.0000 mg | Freq: Once | INTRAMUSCULAR | Status: AC
  Administered 2022-03-09: 30 mg via INTRAMUSCULAR
  Filled 2022-03-09: qty 2

## 2022-03-09 NOTE — ED Provider Notes (Signed)
[facility] DEPT Provider Note  CSN: [id number] Arrival date & time: 03/08/22 2015  Chief Complaint(s) Fall  HPI Holly Mcmillan is a 51 y.o. female {Add pertinent medical, surgical, social history, OB history to HPI:1}   The history is provided by the patient.  Shoulder Injury This is a new problem. The current episode started 6 to 12 hours ago. The problem occurs constantly. The problem has not changed since onset.Pertinent negatives include no chest pain, no abdominal pain, no headaches and no shortness of breath. Exacerbated by: movement. Relieved by: immobility. She has tried nothing for the symptoms.   Missed a step and fell on right shoulder. No head trauma or LOC. No other injuries.  Past Medical History Past Medical History:  Diagnosis Date   Arthritis    Depression    Dysmenorrhea    Gallbladder disease    Headache(784.0)    Hyperlipidemia    Hypertension    Hypothyroidism    Obesity    Ovarian cyst    Patient Active Problem List   Diagnosis Date Noted   Digital mucous cyst of toe 09/18/2021   Atherosclerosis of native coronary artery of native heart without angina pectoris 09/18/2021   OSA (obstructive sleep apnea) 07/24/2021   Class 2 severe obesity due to excess calories with serious comorbidity and body mass index (BMI) of 39.0 to 39.9 in adult ([place]) 04/10/2021   Chronic migraine w/o aura w/o status migrainosus, not intractable 03/03/2021   Spinal stenosis of lumbar region without neurogenic claudication 01/23/2021   Vasomotor rhinitis 01/22/2021   Snoring 01/20/2021   Abnormal MRI of head 12/16/2020   Intractable migraine without aura and with status migrainosus 12/03/2020   Encounter for general adult medical examination with abnormal findings 06/07/2020   Polyp of colon 06/07/2020   Primary osteoarthritis involving multiple joints 08/02/2019   Rotator cuff disorder, left 04/17/2019   Irritable bowel syndrome with diarrhea  01/10/2019   Lumbar radiculopathy, right 07/06/2017   Eczema of face 05/26/2017   Routine general medical examination at a health care facility 03/16/2016   RLS (restless legs syndrome) 03/16/2016   Hypertriglyceridemia, essential 04/04/2015   Hyperlipidemia with target LDL less than 100 04/04/2015   Severe obesity (BMI >= 40) ([place]) 09/20/2013   Hypothyroidism 07/09/2010   Allergic rhinitis due to pollen 06/30/2010   TOBACCO USE 11/28/2009   Depression, major, in remission ([place]) 11/28/2009   Essential hypertension 11/28/2009   Home Medication(s) Prior to Admission medications   Medication Sig Start Date End Date Taking? Authorizing Provider  buPROPion (WELLBUTRIN XL) 300 MG 24 hr tablet TAKE 1 TABLET BY MOUTH EVERY DAY 02/12/22   [name], MD  cephALEXin (KEFLEX) 500 MG capsule Take 1 capsule (500 mg total) by mouth 3 (three) times daily. 01/22/22   Hyatt, Max T, DPM  DULoxetine (CYMBALTA) 60 MG capsule TAKE 1 CAPSULE BY MOUTH EVERY DAY 11/16/21   [name], MD  estradiol (ESTRACE) 2 MG tablet Take 2-4 mg by mouth daily. 07/17/19   [provider]  HYDROcodone-acetaminophen (NORCO/VICODIN) 5-325 MG tablet SMARTSIG:1 Tablet(s) By Mouth 1-3 Times Daily PRN 09/18/21   [provider]  Insulin Pen Needle 32G X 6 MM MISC 1 Act by Does not apply route daily. 05/08/21   [name], MD  ipratropium (ATROVENT) 0.06 % nasal spray Place 2 sprays into both nostrils 3 (three) times daily. 09/21/21   [provider]  KLOR-CON M20 20 MEQ tablet TAKE 1 TABLET BY MOUTH  EVERY DAY 01/12/22   [name], MD  levothyroxine (SYNTHROID) 150 MCG tablet TAKE 1 TABLET BY MOUTH EVERY DAY 01/12/22   [name], MD  lidocaine (LIDODERM) 5 % SMARTSIG:Topical 01/19/22   [provider]  Liraglutide -Weight Management (SAXENDA) 18 MG/3ML SOPN Inject 3 mg into the skin daily. 10/26/21   [name], MD  ondansetron (ZOFRAN) 4 MG tablet Take 1 tablet (4 mg total) by  mouth every 8 (eight) hours as needed. 01/22/22   Hyatt, Max T, DPM  progesterone (PROMETRIUM) 100 MG capsule Take 100 mg by mouth at bedtime. 07/23/19   [provider]  progesterone (PROMETRIUM) 100 MG capsule Take 100 mg by mouth daily. 09/18/21   [provider]  SUMAtriptan (IMITREX) 50 MG tablet Take 1 tablet (50 mg total) by mouth every 2 (two) hours as needed for migraine. May repeat in 2 hours if headache persists or recurs. 03/03/21   Marcial Pacas, MD  terbinafine (LAMISIL) 250 MG tablet Take 1 tablet (250 mg total) by mouth daily. 02/17/22   Hyatt, Max T, DPM  tiZANidine (ZANAFLEX) 4 MG tablet Take 1 tablet (4 mg total) by mouth every 6 (six) hours as needed for muscle spasms. 03/03/21   Marcial Pacas, MD  valACYclovir (VALTREX) 1000 MG tablet TAKE 2 TABLETS BY MOUTH 2 TIMES DAILY FOR 2 DOSES. 12/21/21   [name], MD                                                                                                                                    Allergies Spironolactone and Sulfonamide derivatives  Review of Systems Review of Systems  Respiratory:  Negative for shortness of breath.   Cardiovascular:  Negative for chest pain.  Gastrointestinal:  Negative for abdominal pain.  Neurological:  Negative for headaches.   As noted in HPI  Physical Exam Vital Signs  I have reviewed the triage vital signs BP (!) 147/93 (BP Location: Left Arm)   Pulse 76   Temp 98.1 F (36.7 C) (Tympanic)   Resp 18   Ht '5\' 2"'$  (1.575 m)   Wt 90.7 kg   SpO2 99%   BMI 36.58 kg/m  *** Physical Exam Vitals reviewed.  Constitutional:      General: She is not in acute distress.    Appearance: She is well-developed. She is not diaphoretic.  HENT:     Head: Normocephalic and atraumatic.     Right Ear: External ear normal.     Left Ear: External ear normal.     Nose: Nose normal.  Eyes:     General: No scleral icterus.    Conjunctiva/sclera: Conjunctivae normal.  Neck:      Trachea: Phonation normal.  Cardiovascular:     Rate and Rhythm: Normal rate and regular rhythm.  Pulmonary:     Effort: Pulmonary effort is normal. No respiratory distress.  Breath sounds: No stridor.  Abdominal:     General: There is no distension.  Musculoskeletal:     Right shoulder: Tenderness and bony tenderness present. No swelling or crepitus. Decreased range of motion. Normal strength. Normal pulse.     Right elbow: Normal range of motion. No tenderness.     Cervical back: Normal range of motion.  Neurological:     Mental Status: She is alert and oriented to person, place, and time.  Psychiatric:        Behavior: Behavior normal.     ED Results and Treatments Labs (all labs ordered are listed, but only abnormal results are displayed) Labs Reviewed - No data to display                                                                                                                       EKG  EKG Interpretation  Date/Time:    Ventricular Rate:    PR Interval:    QRS Duration:   QT Interval:    QTC Calculation:   R Axis:     Text Interpretation:         Radiology DG Shoulder Right  Result Date: 03/08/2022 CLINICAL DATA:  Trauma, fall EXAM: RIGHT SHOULDER - 2+ VIEW COMPARISON:  09/04/2019 FINDINGS: No fracture or dislocation is seen. Small bony spurs seen in the lateral margin of acromion and inferior aspect of glenoid. There are no soft tissue calcifications. Surgical clips are seen in thyroid bed. Degenerative changes are noted in visualized lower cervical spine. IMPRESSION: No fracture or dislocation is seen in right shoulder. Electronically Signed   By: Elmer Picker M.D.   On: 03/08/2022 20:47    Medications Ordered in ED Medications  ketorolac (TORADOL) injection 30 mg (has no administration in time range)                                                                                                                                      Procedures Procedures  (including critical care time)  Medical Decision Making / ED Course   Medical Decision Making Amount and/or Complexity of Data Reviewed Radiology: ordered and independent interpretation performed. Decision-making details documented in ED Course.          Final Clinical Impression(s) / ED Diagnoses Final diagnoses:  None    {Document critical care time when appropriate:1}  {Document review of labs and clinical  decision tools ie heart score, Chads2Vasc2 etc:1}  {Document your independent review of radiology images, and any outside records:1} {Document your discussion with family members, caretakers, and with consultants:1} {Document social determinants of health affecting pt's care:1} {Document your decision making why or why not admission, treatments were needed:1} This chart was dictated using voice recognition software.  Despite best efforts to proofread,  errors can occur which can change the documentation meaning.

## 2022-03-09 NOTE — ED Notes (Signed)
Pt ambulatory independently to and from hall bathroom - gait steady; no acute distress.  Pt guarding RUE however no obvious deformities noted - skin intact.  RUE distal neurovascular intact.  Will monitor for acute changes and maintain plan of care.

## 2022-03-10 ENCOUNTER — Telehealth: Payer: Self-pay

## 2022-03-10 NOTE — Telephone Encounter (Signed)
Please give her a call back.

## 2022-03-10 NOTE — Telephone Encounter (Signed)
Transition Care Management Follow-up Telephone Call Date of discharge and from where: [facility]  How have you been since you were released from the hospital? Pt stating tha she is not better. She seen ortho and was Dx with a ear in her rotator cuff. Any questions or concerns? No  Items Reviewed: Did the pt receive and understand the discharge instructions provided? Yes  Medications obtained and verified? Yes  Other?  N/a Any new allergies since your discharge? No  Dietary orders reviewed? No Do you have support at home? Yes   Home Care and Equipment/Supplies: Were home health services ordered? not applicable If so, what is the name of the agency? N/a  Has the agency set up a time to come to the patient's home? not applicable Were any new equipment or medical supplies ordered?  No What is the name of the medical supply agency? N/a Were you able to get the supplies/equipment? not applicable Do you have any questions related to the use of the equipment or supplies? No  Functional Questionnaire: (I = Independent and D = Dependent) ADLs: I  Bathing/Dressing- I  Meal Prep- I  Eating- I  Maintaining continence- I  Transferring/Ambulation- I  Managing Meds- I  Follow up appointments reviewed:  PCP Hospital f/u appt confirmed? No    Specialist Hospital f/u appt confirmed? Yes  Scheduled to see Ortho    Are transportation arrangements needed? No  If their condition worsens, is the pt aware to call PCP or go to the Emergency Dept.? Yes Was the patient provided with contact information for the PCP's office or ED? Yes Was to pt encouraged to call back with questions or concerns? Yes

## 2022-03-16 ENCOUNTER — Other Ambulatory Visit: Payer: Self-pay | Admitting: Nurse Practitioner

## 2022-03-16 ENCOUNTER — Ambulatory Visit
Admission: RE | Admit: 2022-03-16 | Discharge: 2022-03-16 | Disposition: A | Discharge status: Home / Self Care | Payer: 59 | Source: Ambulatory Visit | Attending: Nurse Practitioner | Admitting: Nurse Practitioner

## 2022-03-16 DIAGNOSIS — M25551 Pain in right hip: Secondary | ICD-10-CM

## 2022-03-30 ENCOUNTER — Encounter (HOSPITAL_BASED_OUTPATIENT_CLINIC_OR_DEPARTMENT_OTHER): Payer: Self-pay | Admitting: Emergency Medicine

## 2022-03-30 ENCOUNTER — Emergency Department (HOSPITAL_BASED_OUTPATIENT_CLINIC_OR_DEPARTMENT_OTHER): Payer: 59 | Admitting: Radiology

## 2022-03-30 ENCOUNTER — Emergency Department (HOSPITAL_BASED_OUTPATIENT_CLINIC_OR_DEPARTMENT_OTHER)
Admission: EM | Admit: 2022-03-30 | Discharge: 2022-03-30 | Disposition: A | Discharge status: Home / Self Care | Payer: 59 | Attending: Emergency Medicine | Admitting: Emergency Medicine

## 2022-03-30 ENCOUNTER — Emergency Department (HOSPITAL_BASED_OUTPATIENT_CLINIC_OR_DEPARTMENT_OTHER): Payer: 59

## 2022-03-30 ENCOUNTER — Other Ambulatory Visit: Payer: Self-pay

## 2022-03-30 DIAGNOSIS — Z79899 Other long term (current) drug therapy: Secondary | ICD-10-CM | POA: Diagnosis not present

## 2022-03-30 DIAGNOSIS — I10 Essential (primary) hypertension: Secondary | ICD-10-CM | POA: Insufficient documentation

## 2022-03-30 DIAGNOSIS — R0789 Other chest pain: Secondary | ICD-10-CM | POA: Diagnosis present

## 2022-03-30 DIAGNOSIS — Z794 Long term (current) use of insulin: Secondary | ICD-10-CM | POA: Insufficient documentation

## 2022-03-30 LAB — PREGNANCY, URINE: Preg Test, Ur: NEGATIVE

## 2022-03-30 LAB — CBC
HCT: 45.2 % (ref 36.0–46.0)
Hemoglobin: 14.8 g/dL (ref 12.0–15.0)
MCH: 31.5 pg (ref 26.0–34.0)
MCHC: 32.7 g/dL (ref 30.0–36.0)
MCV: 96.2 fL (ref 80.0–100.0)
Platelets: 416 10*3/uL — ABNORMAL HIGH (ref 150–400)
RBC: 4.7 MIL/uL (ref 3.87–5.11)
RDW: 12.6 % (ref 11.5–15.5)
WBC: 12.1 10*3/uL — ABNORMAL HIGH (ref 4.0–10.5)
nRBC: 0 % (ref 0.0–0.2)

## 2022-03-30 LAB — BASIC METABOLIC PANEL
Anion gap: 12 (ref 5–15)
BUN: 19 mg/dL (ref 6–20)
CO2: 23 mmol/L (ref 22–32)
Calcium: 9.2 mg/dL (ref 8.9–10.3)
Chloride: 101 mmol/L (ref 98–111)
Creatinine, Ser: 0.67 mg/dL (ref 0.44–1.00)
GFR, Estimated: >60 mL/min (ref >60)
Glucose, Bld: 89 mg/dL (ref 70–99)
Potassium: 3.6 mmol/L (ref 3.5–5.1)
Sodium: 136 mmol/L (ref 135–145)

## 2022-03-30 LAB — TROPONIN I (HIGH SENSITIVITY)
Troponin I (High Sensitivity): <2 ng/L (ref <18)
Troponin I (High Sensitivity): 2 ng/L (ref <18)

## 2022-03-30 LAB — D-DIMER, QUANTITATIVE: D-Dimer, Quant: 1.11 ug/mL-FEU — ABNORMAL HIGH (ref 0.00–0.50)

## 2022-03-30 MED ORDER — OXYCODONE-ACETAMINOPHEN 5-325 MG PO TABS
1.0000 | ORAL_TABLET | Freq: Once | ORAL | Status: AC
  Administered 2022-03-30: 1 via ORAL
  Filled 2022-03-30: qty 1

## 2022-03-30 MED ORDER — IOHEXOL 350 MG/ML SOLN
100.0000 mL | Freq: Once | INTRAVENOUS | Status: AC | PRN
  Administered 2022-03-30: 66 mL via INTRAVENOUS

## 2022-03-30 MED ORDER — KETOROLAC TROMETHAMINE 30 MG/ML IJ SOLN
15.0000 mg | Freq: Once | INTRAMUSCULAR | Status: AC
Start: 2022-03-30 — End: 2022-03-30
  Administered 2022-03-30: 15 mg via INTRAMUSCULAR
  Filled 2022-03-30: qty 1

## 2022-03-30 NOTE — Discharge Instructions (Addendum)
Note the work-up today was overall reassuring.  As we discussed, take medication at home including Tylenol, your prescribed pain medication as well as using Lidoderm patch/icing for symptomatic relief.  Recommend follow-up with your surgeon for reevaluation in 3 to 5 days.  Please do not hesitate to return to emergency department for worrisome signs and symptoms we discussed become apparent.

## 2022-03-30 NOTE — ED Provider Notes (Signed)
[facility] Provider Note   CSN: [id number] Arrival date & time: 03/30/22  1233     History  Chief Complaint  Patient presents with   Chest Pain    Holly Mcmillan is a 51 y.o. female.   Chest Pain   51 year old female presents to the ED with complaints of chest pain. Patient states that pain has been present since awakening from anesthesia this past Tuesday from a rotator cuff surgery. She was seen by orthopedic earlier today who told her to come to the ED for further evaluation. Patient reports pain located mid sternal and just left of sternum with some pain experienced in area of left scapula. No overlying skin abnormalities noticed. Pain is worsened by lying on affected areas. States that pain has responded somewhat to at home post op opiates. Denies fever, chills, nights sweats, shob, abdominal pain, nausea, vomiting. Denies hx of DVT/PE, known malignancy; she does state she has been more sedentary post op and is on both estradiol and progesterone.   PMH significant for depression, HTN, HLD, obesisty, tobacco use.   Home Medications Prior to Admission medications   Medication Sig Start Date End Date Taking? Authorizing Provider  buPROPion (WELLBUTRIN XL) 300 MG 24 hr tablet TAKE 1 TABLET BY MOUTH EVERY DAY 02/12/22   [name], MD  cephALEXin (KEFLEX) 500 MG capsule Take 1 capsule (500 mg total) by mouth 3 (three) times daily. 01/22/22   Hyatt, Max T, DPM  DULoxetine (CYMBALTA) 60 MG capsule TAKE 1 CAPSULE BY MOUTH EVERY DAY 11/16/21   [name], MD  estradiol (ESTRACE) 2 MG tablet Take 2-4 mg by mouth daily. 07/17/19   [provider]  HYDROcodone-acetaminophen (NORCO/VICODIN) 5-325 MG tablet SMARTSIG:1 Tablet(s) By Mouth 1-3 Times Daily PRN 09/18/21   [provider]  Insulin Pen Needle 32G X 6 MM MISC 1 Act by Does not apply route daily. 05/08/21   [name], MD  ipratropium (ATROVENT) 0.06 % nasal spray Place 2 sprays  into both nostrils 3 (three) times daily. 09/21/21   [provider]  KLOR-CON M20 20 MEQ tablet TAKE 1 TABLET BY MOUTH EVERY DAY 01/12/22   [name], MD  levothyroxine (SYNTHROID) 150 MCG tablet TAKE 1 TABLET BY MOUTH EVERY DAY 01/12/22   [name], MD  lidocaine (LIDODERM) 5 % SMARTSIG:Topical 01/19/22   [provider]  Liraglutide -Weight Management (SAXENDA) 18 MG/3ML SOPN Inject 3 mg into the skin daily. 10/26/21   [name], MD  ondansetron (ZOFRAN) 4 MG tablet Take 1 tablet (4 mg total) by mouth every 8 (eight) hours as needed. 01/22/22   Hyatt, Max T, DPM  progesterone (PROMETRIUM) 100 MG capsule Take 100 mg by mouth at bedtime. 07/23/19   [provider]  progesterone (PROMETRIUM) 100 MG capsule Take 100 mg by mouth daily. 09/18/21   [provider]  SUMAtriptan (IMITREX) 50 MG tablet Take 1 tablet (50 mg total) by mouth every 2 (two) hours as needed for migraine. May repeat in 2 hours if headache persists or recurs. 03/03/21   Marcial Pacas, MD  terbinafine (LAMISIL) 250 MG tablet Take 1 tablet (250 mg total) by mouth daily. 02/17/22   Hyatt, Max T, DPM  tiZANidine (ZANAFLEX) 4 MG tablet Take 1 tablet (4 mg total) by mouth every 6 (six) hours as needed for muscle spasms. 03/03/21   Marcial Pacas, MD  valACYclovir (VALTREX) 1000 MG tablet TAKE 2 TABLETS BY MOUTH  2 TIMES DAILY FOR 2 DOSES. 12/21/21   [name], MD      Allergies    Spironolactone and Sulfonamide derivatives    Review of Systems   Review of Systems  Cardiovascular:  Positive for chest pain.  All other systems reviewed and are negative.   Physical Exam Updated Vital Signs BP 136/82 (BP Location: Right Arm)   Pulse 85   Temp 98.7 F (37.1 C) (Oral)   Resp 16   SpO2 100%  Physical Exam Vitals and nursing note reviewed.  Constitutional:      General: She is not in acute distress.    Appearance: She is well-developed.     Comments: R arm sling in place  HENT:      Head: Normocephalic and atraumatic.  Eyes:     Conjunctiva/sclera: Conjunctivae normal.  Cardiovascular:     Rate and Rhythm: Normal rate and regular rhythm.     Heart sounds: No murmur heard. Pulmonary:     Effort: Pulmonary effort is normal. No respiratory distress.     Breath sounds: Normal breath sounds. No wheezing, rhonchi or rales.  Chest:     Chest wall: Tenderness present.     Comments: Patient has ttp over mid sternal and just left of sternum where pain was said to be located.  Abdominal:     Palpations: Abdomen is soft.     Tenderness: There is no abdominal tenderness.  Musculoskeletal:        General: No swelling.     Cervical back: Neck supple.  Skin:    General: Skin is warm and dry.     Capillary Refill: Capillary refill takes less than 2 seconds.  Neurological:     Mental Status: She is alert.  Psychiatric:        Mood and Affect: Mood normal.     ED Results / Procedures / Treatments   Labs (all labs ordered are listed, but only abnormal results are displayed) Labs Reviewed  CBC - Abnormal; Notable for the following components:      Result Value   WBC 12.1 (*)    Platelets 416 (*)    All other components within normal limits  D-DIMER, QUANTITATIVE - Abnormal; Notable for the following components:   D-Dimer, Quant 1.11 (*)    All other components within normal limits  BASIC METABOLIC PANEL  PREGNANCY, URINE  TROPONIN I (HIGH SENSITIVITY)  TROPONIN I (HIGH SENSITIVITY)    EKG EKG Interpretation  Date/Time:  Monday March 30 2022 13:00:21 EST Ventricular Rate:  86 PR Interval:  152 QRS Duration: 84 QT Interval:  358 QTC Calculation: 428 R Axis:   82 Text Interpretation: Normal sinus rhythm Anterior infarct , age undetermined Abnormal ECG When compared with ECG of 18-Jun-2016 11:34, No significant change was found No significant change since last tracing Confirmed by [name] [phone]) on 03/30/2022 3:28:02 PM  Radiology CT Angio Chest  PE W and/or Wo Contrast  Result Date: 03/30/2022 CLINICAL DATA:  Pulmonary embolism (PE) suspected, low to intermediate prob, positive D-dimer. Recent right shoulder surgery EXAM: CT ANGIOGRAPHY CHEST WITH CONTRAST TECHNIQUE: Multidetector CT imaging of the chest was performed using the standard protocol during bolus administration of intravenous contrast. Multiplanar CT image reconstructions and MIPs were obtained to evaluate the vascular anatomy. RADIATION DOSE REDUCTION: This exam was performed according to the departmental dose-optimization program which includes automated exposure control, adjustment of the mA and/or kV according to patient size and/or use of iterative reconstruction  technique. CONTRAST:  94m OMNIPAQUE IOHEXOL 350 MG/ML SOLN COMPARISON:  11/27/2021 FINDINGS: Cardiovascular: Satisfactory opacification of the pulmonary arteries to the segmental level. No evidence of pulmonary embolism. Thoracic aorta is normal in course and caliber. Normal heart size. No pericardial effusion. Mediastinum/Nodes: No enlarged mediastinal, hilar, or axillary lymph nodes. Thyroid gland, trachea, and esophagus demonstrate no significant findings. Lungs/Pleura: Lungs are clear. No pleural effusion or pneumothorax. Upper Abdomen: No acute abnormality. Musculoskeletal: No chest wall abnormality. No acute or significant osseous findings. Review of the MIP images confirms the above findings. IMPRESSION: No evidence of pulmonary embolism or other acute intrathoracic process. Electronically Signed   By: NDavina PokeD.O.   On: 03/30/2022 18:49   DG Chest 2 View  Result Date: 03/30/2022 CLINICAL DATA:  Chest pain since right shoulder surgery. EXAM: CHEST - 2 VIEW COMPARISON:  06/30/2010 and CT chest 11/27/2021. FINDINGS: Trachea is midline. Heart size normal. Lungs are clear. No pleural fluid. IMPRESSION: No acute findings. Electronically Signed   By: MLorin PicketM.D.   On: 03/30/2022 13:34     Procedures Procedures    Medications Ordered in ED Medications  oxyCODONE-acetaminophen (PERCOCET/ROXICET) 5-325 MG per tablet 1 tablet (1 tablet Oral Given 03/30/22 1625)  ketorolac (TORADOL) 30 MG/ML injection 15 mg (15 mg Intramuscular Given 03/30/22 1624)  iohexol (OMNIPAQUE) 350 MG/ML injection 100 mL (66 mLs Intravenous Contrast Given 03/30/22 1801)    ED Course/ Medical Decision Making/ A&P Clinical Course as of 03/30/22 1936  Mon Mar 30, 2022  1623 Elevated D-dimer.  CT angio chest PE ordered for further relation of possible pulmonary embolism. [CR]    Clinical Course User Index [CR] RWilnette Kales PA                           Medical Decision Making Amount and/or Complexity of Data Reviewed Labs: ordered. Radiology: ordered.  Risk Prescription drug management.   This patient presents to the ED for concern of chest pain chest pain, this involves an extensive number of treatment options, and is a complaint that carries with it a high risk of complications and morbidity.  The differential diagnosis includes ACS, PE, pneumothorax, pleurisy, pneumonia, aortic dissection, MSK, tamponade, myocarditis/pericarditis  Co morbidities that complicate the patient evaluation  See HPI   Additional history obtained:  Additional history obtained from EMR External records from outside source obtained and reviewed including hospital records   Lab Tests:  I Ordered, and personally interpreted labs.  The pertinent results include: D-dimer elevated 1.11 which CT scan of the chest was subsequently pursued.  Leukocytosis of 12.1 likely secondary to patient's pain.  Platelets slightly elevated at 416.  No evidence of anemia.  Thrombocytosis of 416.  No electrolyte abnormalities noted.  Renal function within normal limits.  Initial troponin of 2 with repeat 2; EKG significant for sinus rhythm with no acute ischemic changes.   Imaging Studies ordered:  I ordered imaging studies  including chest x-ray, CT angio chest PE I independently visualized and interpreted imaging which showed  Chest x-ray: No acute cardiopulmonary abnormality CT angio chest PE: No evidence of acute PE or other intrathoracic abnormality. I agree with the radiologist interpretation   Cardiac Monitoring: / EKG:  The patient was maintained on a cardiac monitor.  I personally viewed and interpreted the cardiac monitored which showed an underlying rhythm of: Sinus rhythm with no acute ischemic changes.   Consultations Obtained:  N/a   Problem List /  ED Course / Critical interventions / Medication management  Chest wall pain I ordered medication including Toradol and Percocet for pain.    Reevaluation of the patient after these medicines showed that the patient improved I have reviewed the patients home medicines and have made adjustments as needed   Social Determinants of Health:  Denies tobacco, illicit drug use.   Test / Admission - Considered:  Chest wall pain Vitals signs within normal range and stable throughout visit. Laboratory/imaging studies significant for: See above Patient symptoms likely secondary to musculoskeletal etiology given reproducible on exam with overall negative work-up.  Doubt ACS given delta negative troponin and no acute ischemic changes indicated on EKG.  Doubt PE given negative PE study; no lower extremity swelling and subsequent pain so further work-up via lower extremity ultrasounds needed necessary at this time.  Doubt aortic dissection.  Doubt pneumothorax.  Doubt pneumonia. Doubt tamponade/myocarditis/pericarditis.  Patient advised by surgeon not to take NSAIDs for the first 2 months post surgery so recommend avoiding NSAID medications.  Recommended continue taking at home pain medication as well as Tylenol and icing affected areas to help with relief.  Patient reassured with overall negative work-up today.  Recommend reevaluation by PCP/surgeon in 3 to 5  days.  Treatment plan discussed with patient she did not dressing was agreeable to said plan. Worrisome signs and symptoms were discussed with the patient, and the patient acknowledged understanding to return to the ED if noticed. Patient was stable upon discharge.          Final Clinical Impression(s) / ED Diagnoses Final diagnoses:  Chest wall pain    Rx / DC Orders ED Discharge Orders     None         Wilnette Kales, [place] 03/30/22 1936    Elgie Congo, MD 03/31/22 0006

## 2022-03-30 NOTE — ED Triage Notes (Signed)
Chest pain since rotator cuff surgery on Tuesday. "Sharp pain pressure like someone has knocked the wind out of you"

## 2022-04-01 ENCOUNTER — Telehealth: Payer: Self-pay

## 2022-04-01 NOTE — Telephone Encounter (Signed)
Transition Care Management Follow-up Telephone Call Date of discharge and from where: [facility] [facility] on 03/30/2022 How have you been since you were released from the hospital? "Still not feeling any better" Any questions or concerns? No  Items Reviewed: Did the pt receive and understand the discharge instructions provided? Yes  Medications obtained and verified? Yes  Other? No  Any new allergies since your discharge? No  Dietary orders reviewed? No Do you have support at home? Yes  husband  Home Care and Equipment/Supplies: Were home health services ordered? no If so, what is the name of the agency? no  Has the agency set up a time to come to the patient's home? not applicable Were any new equipment or medical supplies ordered?  No What is the name of the medical supply agency? No Were you able to get the supplies/equipment? not applicable Do you have any questions related to the use of the equipment or supplies? No  Functional Questionnaire: (I = Independent and D = Dependent) ADLs: I  Bathing/Dressing- I  Meal Prep- I  Eating- I  Maintaining continence- I  Transferring/Ambulation- I  Managing Meds- I  Follow up appointments reviewed:  PCP Hospital f/u appt confirmed? Yes  Scheduled to see Holly Calico, MD on 04/15/2022 @ 1:20 pm. [facility] f/u appt confirmed? No   Are transportation arrangements needed? No  If their condition worsens, is the pt aware to call PCP or go to the Emergency Dept.? Yes Was the patient provided with contact information for the PCP's office or ED? Yes Was to pt encouraged to call back with questions or concerns? Yes  Holly Mcmillan, [place] [facility]  Site: [facility] [place]: [phone]  Fax: [phone]

## 2022-04-05 ENCOUNTER — Other Ambulatory Visit: Payer: Self-pay | Admitting: Internal Medicine

## 2022-04-05 DIAGNOSIS — E039 Hypothyroidism, unspecified: Secondary | ICD-10-CM

## 2022-04-15 ENCOUNTER — Ambulatory Visit (INDEPENDENT_AMBULATORY_CARE_PROVIDER_SITE_OTHER): Payer: 59 | Admitting: Internal Medicine

## 2022-04-15 ENCOUNTER — Other Ambulatory Visit: Payer: Self-pay | Admitting: Internal Medicine

## 2022-04-15 ENCOUNTER — Encounter: Payer: Self-pay | Admitting: Internal Medicine

## 2022-04-15 VITALS — BP 136/86 | HR 78 | Temp 97.7°F | Ht 62.0 in | Wt 215.0 lb

## 2022-04-15 DIAGNOSIS — E039 Hypothyroidism, unspecified: Secondary | ICD-10-CM

## 2022-04-15 DIAGNOSIS — I10 Essential (primary) hypertension: Secondary | ICD-10-CM | POA: Diagnosis not present

## 2022-04-15 DIAGNOSIS — I2510 Atherosclerotic heart disease of native coronary artery without angina pectoris: Secondary | ICD-10-CM

## 2022-04-15 DIAGNOSIS — E785 Hyperlipidemia, unspecified: Secondary | ICD-10-CM

## 2022-04-15 DIAGNOSIS — J300 Vasomotor rhinitis: Secondary | ICD-10-CM

## 2022-04-15 DIAGNOSIS — J301 Allergic rhinitis due to pollen: Secondary | ICD-10-CM | POA: Diagnosis not present

## 2022-04-15 DIAGNOSIS — E781 Pure hyperglyceridemia: Secondary | ICD-10-CM

## 2022-04-15 DIAGNOSIS — G43011 Migraine without aura, intractable, with status migrainosus: Secondary | ICD-10-CM

## 2022-04-15 DIAGNOSIS — Z6839 Body mass index (BMI) 39.0-39.9, adult: Secondary | ICD-10-CM | POA: Diagnosis not present

## 2022-04-15 LAB — HEPATIC FUNCTION PANEL
ALT: 18 U/L (ref 0–35)
AST: 17 U/L (ref 0–37)
Albumin: 4.4 g/dL (ref 3.5–5.2)
Alkaline Phosphatase: 72 U/L (ref 39–117)
Bilirubin, Direct: 0 mg/dL (ref 0.0–0.3)
Total Bilirubin: 0.3 mg/dL (ref 0.2–1.2)
Total Protein: 7.6 g/dL (ref 6.0–8.3)

## 2022-04-15 LAB — LDL CHOLESTEROL, DIRECT: Direct LDL: 194 mg/dL

## 2022-04-15 LAB — TSH: TSH: 1.1 u[IU]/mL (ref 0.35–5.50)

## 2022-04-15 LAB — LIPID PANEL
Cholesterol: 299 mg/dL — ABNORMAL HIGH (ref 0–200)
HDL: 66.9 mg/dL (ref >39.00)
NonHDL: 232.51
Total CHOL/HDL Ratio: 4
Triglycerides: 339 mg/dL — ABNORMAL HIGH (ref 0.0–149.0)
VLDL: 67.8 mg/dL — ABNORMAL HIGH (ref 0.0–40.0)

## 2022-04-15 MED ORDER — SUMATRIPTAN SUCCINATE 50 MG PO TABS: 50.0000 mg | ORAL_TABLET | ORAL | 6 refills | Status: DC | PRN

## 2022-04-15 MED ORDER — SEMAGLUTIDE-WEIGHT MANAGEMENT 0.5 MG/0.5ML ~~LOC~~ SOAJ: 0.5000 mg | SUBCUTANEOUS | 0 refills | Status: AC

## 2022-04-15 MED ORDER — SEMAGLUTIDE-WEIGHT MANAGEMENT 1.7 MG/0.75ML ~~LOC~~ SOAJ: 1.7000 mg | SUBCUTANEOUS | 0 refills | Status: DC

## 2022-04-15 MED ORDER — SEMAGLUTIDE-WEIGHT MANAGEMENT 2.4 MG/0.75ML ~~LOC~~ SOAJ: 2.4000 mg | SUBCUTANEOUS | 0 refills | Status: DC

## 2022-04-15 MED ORDER — SEMAGLUTIDE-WEIGHT MANAGEMENT 0.25 MG/0.5ML ~~LOC~~ SOAJ: 0.2500 mg | SUBCUTANEOUS | 0 refills | Status: AC

## 2022-04-15 MED ORDER — IPRATROPIUM BROMIDE 0.06 % NA SOLN: 2.0000 | Freq: Three times a day (TID) | NASAL | 3 refills | Status: DC

## 2022-04-15 MED ORDER — SEMAGLUTIDE-WEIGHT MANAGEMENT 1 MG/0.5ML ~~LOC~~ SOAJ: 1.0000 mg | SUBCUTANEOUS | 0 refills | Status: DC

## 2022-04-15 NOTE — Progress Notes (Unsigned)
Subjective:  Patient ID: Holly Mcmillan, female    DOB: [birth year]  Age: 51 y.o. MRN: [id number]  CC: Hypertension, Allergic Rhinitis , Hyperlipidemia, and Hypothyroidism   HPI Holly Mcmillan presents for f/up -   She recently had a problem with her right shoulder and was held in an awkward position which resulted in substernal chest pain that she describes as a stabbing and a pressure.  She was seen in the ED and the workup was unremarkable.  The pain is diminishing.  She denies diaphoresis, cough, hemoptysis, shortness of breath, edema, or dyspnea on exertion.  She refused a flu vaccine today.  Headaches are well-controlled with the triptan and she would like a refill.  She complains of weight gain and would like to start using Wegovy.   Outpatient Medications Prior to Visit  Medication Sig Dispense Refill   buPROPion (WELLBUTRIN XL) 300 MG 24 hr tablet TAKE 1 TABLET BY MOUTH EVERY DAY 30 tablet 5   DULoxetine (CYMBALTA) 60 MG capsule TAKE 1 CAPSULE BY MOUTH EVERY DAY 30 capsule 5   estradiol (ESTRACE) 2 MG tablet Take 2-4 mg by mouth daily.     HYDROcodone-acetaminophen (NORCO/VICODIN) 5-325 MG tablet SMARTSIG:1 Tablet(s) By Mouth 1-3 Times Daily PRN     Insulin Pen Needle 32G X 6 MM MISC 1 Act by Does not apply route daily. 100 each 1   KLOR-CON M20 20 MEQ tablet TAKE 1 TABLET BY MOUTH EVERY DAY 30 tablet 5   levothyroxine (SYNTHROID) 150 MCG tablet TAKE 1 TABLET BY MOUTH EVERY DAY 30 tablet 2   lidocaine (LIDODERM) 5 % SMARTSIG:Topical     progesterone (PROMETRIUM) 100 MG capsule Take 100 mg by mouth daily.     tiZANidine (ZANAFLEX) 4 MG tablet Take 1 tablet (4 mg total) by mouth every 6 (six) hours as needed for muscle spasms. 30 tablet 6   valACYclovir (VALTREX) 1000 MG tablet TAKE 2 TABLETS BY MOUTH 2 TIMES DAILY FOR 2 DOSES. 4 tablet 2   cephALEXin (KEFLEX) 500 MG capsule Take 1 capsule (500 mg total) by mouth 3 (three) times daily. 30 capsule 0   ipratropium (ATROVENT) 0.06 %  nasal spray Place 2 sprays into both nostrils 3 (three) times daily.     Liraglutide -Weight Management (SAXENDA) 18 MG/3ML SOPN Inject 3 mg into the skin daily. 45 mL 1   ondansetron (ZOFRAN) 4 MG tablet Take 1 tablet (4 mg total) by mouth every 8 (eight) hours as needed. 20 tablet 0   progesterone (PROMETRIUM) 100 MG capsule Take 100 mg by mouth at bedtime.     SUMAtriptan (IMITREX) 50 MG tablet Take 1 tablet (50 mg total) by mouth every 2 (two) hours as needed for migraine. May repeat in 2 hours if headache persists or recurs. 12 tablet 6   terbinafine (LAMISIL) 250 MG tablet Take 1 tablet (250 mg total) by mouth daily. 90 tablet 0   No facility-administered medications prior to visit.    ROS Review of Systems  Constitutional:  Positive for unexpected weight change (wt gain). Negative for diaphoresis and fatigue.  HENT:  Positive for postnasal drip and rhinorrhea. Negative for nosebleeds, sinus pressure, sore throat and trouble swallowing.   Eyes: Negative.   Respiratory:  Negative for cough, chest tightness, shortness of breath and wheezing.   Cardiovascular:  Positive for chest pain. Negative for palpitations and leg swelling.  Gastrointestinal:  Negative for abdominal pain, constipation, diarrhea, nausea and vomiting.  Endocrine: Negative.   Genitourinary:  Negative.  Negative for difficulty urinating.  Musculoskeletal: Negative.  Negative for back pain, joint swelling and myalgias.  Skin: Negative.   Neurological: Negative.  Negative for dizziness, weakness and light-headedness.  Hematological:  Negative for adenopathy. Does not bruise/bleed easily.  Psychiatric/Behavioral: Negative.      Objective:  BP 136/86 (BP Location: Left Arm, Patient Position: Sitting, Cuff Size: Large)   Pulse 78   Temp 97.7 F (36.5 C) (Oral)   Ht '5\' 2"'$  (1.575 m)   Wt 215 lb (97.5 kg)   SpO2 97%   BMI 39.32 kg/m   BP Readings from Last 3 Encounters:  04/15/22 136/86  03/30/22 136/82  03/09/22  (!) 140/90    Wt Readings from Last 3 Encounters:  04/15/22 215 lb (97.5 kg)  03/08/22 200 lb (90.7 kg)  09/18/21 213 lb (96.6 kg)    Physical Exam Vitals reviewed.  Constitutional:      Appearance: She is not ill-appearing.  HENT:     Mouth/Throat:     Mouth: Mucous membranes are moist.  Eyes:     General: No scleral icterus.    Conjunctiva/sclera: Conjunctivae normal.  Cardiovascular:     Rate and Rhythm: Normal rate and regular rhythm.     Heart sounds: No murmur heard. Pulmonary:     Effort: Pulmonary effort is normal.     Breath sounds: No stridor. No wheezing, rhonchi or rales.  Abdominal:     General: Abdomen is flat.     Palpations: There is no mass.     Tenderness: There is no abdominal tenderness. There is no guarding.     Hernia: No hernia is present.  Musculoskeletal:        General: Normal range of motion.     Cervical back: Neck supple.     Right lower leg: No edema.     Left lower leg: No edema.  Lymphadenopathy:     Cervical: No cervical adenopathy.  Skin:    General: Skin is warm and dry.  Neurological:     General: No focal deficit present.     Mental Status: She is alert.  Psychiatric:        Mood and Affect: Mood normal.        Behavior: Behavior normal.     Lab Results  Component Value Date   WBC 12.1 (H) 03/30/2022   HGB 14.8 03/30/2022   HCT 45.2 03/30/2022   PLT 416 (H) 03/30/2022   GLUCOSE 89 03/30/2022   CHOL 299 (H) 04/15/2022   TRIG 339.0 (H) 04/15/2022   HDL 66.90 04/15/2022   LDLDIRECT 194.0 04/15/2022   LDLCALC 163 (H) 06/07/2020   ALT 18 04/15/2022   AST 17 04/15/2022   NA 136 03/30/2022   K 3.6 03/30/2022   CL 101 03/30/2022   CREATININE 0.67 03/30/2022   BUN 19 03/30/2022   CO2 23 03/30/2022   TSH 1.10 04/15/2022   HGBA1C 5.5 03/16/2016    CT Angio Chest PE W and/or Wo Contrast  Result Date: 03/30/2022 CLINICAL DATA:  Pulmonary embolism (PE) suspected, low to intermediate prob, positive D-dimer. Recent right  shoulder surgery EXAM: CT ANGIOGRAPHY CHEST WITH CONTRAST TECHNIQUE: Multidetector CT imaging of the chest was performed using the standard protocol during bolus administration of intravenous contrast. Multiplanar CT image reconstructions and MIPs were obtained to evaluate the vascular anatomy. RADIATION DOSE REDUCTION: This exam was performed according to the departmental dose-optimization program which includes automated exposure control, adjustment of the mA and/or kV according  to patient size and/or use of iterative reconstruction technique. CONTRAST:  59m OMNIPAQUE IOHEXOL 350 MG/ML SOLN COMPARISON:  11/27/2021 FINDINGS: Cardiovascular: Satisfactory opacification of the pulmonary arteries to the segmental level. No evidence of pulmonary embolism. Thoracic aorta is normal in course and caliber. Normal heart size. No pericardial effusion. Mediastinum/Nodes: No enlarged mediastinal, hilar, or axillary lymph nodes. Thyroid gland, trachea, and esophagus demonstrate no significant findings. Lungs/Pleura: Lungs are clear. No pleural effusion or pneumothorax. Upper Abdomen: No acute abnormality. Musculoskeletal: No chest wall abnormality. No acute or significant osseous findings. Review of the MIP images confirms the above findings. IMPRESSION: No evidence of pulmonary embolism or other acute intrathoracic process. Electronically Signed   By: NDavina PokeD.O.   On: 03/30/2022 18:49   DG Chest 2 View  Result Date: 03/30/2022 CLINICAL DATA:  Chest pain since right shoulder surgery. EXAM: CHEST - 2 VIEW COMPARISON:  06/30/2010 and CT chest 11/27/2021. FINDINGS: Trachea is midline. Heart size normal. Lungs are clear. No pleural fluid. IMPRESSION: No acute findings. Electronically Signed   By: MLorin PicketM.D.   On: 03/30/2022 13:34    Assessment & Plan:   KJananwas seen today for hypertension, allergic rhinitis , hyperlipidemia and hypothyroidism.  Diagnoses and all orders for this  visit:  Intractable migraine without aura and with status migrainosus -     SUMAtriptan (IMITREX) 50 MG tablet; Take 1 tablet (50 mg total) by mouth every 2 (two) hours as needed for migraine. May repeat in 2 hours if headache persists or recurs.  Seasonal allergic rhinitis due to pollen -     ipratropium (ATROVENT) 0.06 % nasal spray; Place 2 sprays into both nostrils 3 (three) times daily.  Essential hypertension- Her blood pressure is well-controlled.  Acquired hypothyroidism- She is euthyroid. -     TSH; Future -     TSH  Vasomotor rhinitis  Class 2 severe obesity due to excess calories with serious comorbidity and body mass index (BMI) of 39.0 to 39.9 in adult (HCC) -     TSH; Future -     Hepatic function panel; Future -     Semaglutide-Weight Management 0.25 MG/0.5ML SOAJ; Inject 0.25 mg into the skin once a week for 28 days. -     Semaglutide-Weight Management 0.5 MG/0.5ML SOAJ; Inject 0.5 mg into the skin once a week for 28 days. -     Semaglutide-Weight Management 1 MG/0.5ML SOAJ; Inject 1 mg into the skin once a week for 28 days. -     Semaglutide-Weight Management 1.7 MG/0.75ML SOAJ; Inject 1.7 mg into the skin once a week for 28 days. -     Semaglutide-Weight Management 2.4 MG/0.75ML SOAJ; Inject 2.4 mg into the skin once a week for 28 days. -     Hepatic function panel -     TSH  Hyperlipidemia with target LDL less than 100- Her LDL is above 190.  Will start a statin. -     Lipid panel; Future -     TSH; Future -     Hepatic function panel; Future -     Hepatic function panel -     TSH -     Lipid panel -     Pitavastatin Calcium 4 MG TABS; Take 1 tablet (4 mg total) by mouth daily.  Hypertriglyceridemia, essential -     icosapent Ethyl (VASCEPA) 1 g capsule; Take 2 capsules (2 g total) by mouth 2 (two) times daily.  Atherosclerosis of native coronary artery  of native heart without angina pectoris- Will address risk factor modifications. -     Pitavastatin  Calcium 4 MG TABS; Take 1 tablet (4 mg total) by mouth daily. -     icosapent Ethyl (VASCEPA) 1 g capsule; Take 2 capsules (2 g total) by mouth 2 (two) times daily.  Other orders -     LDL cholesterol, direct   I have discontinued Holly Mcmillan. Belson's Saxenda, ondansetron, and cephALEXin. I am also having her start on Semaglutide-Weight Management, Semaglutide-Weight Management, Semaglutide-Weight Management, Semaglutide-Weight Management, Semaglutide-Weight Management, Pitavastatin Calcium, and icosapent Ethyl. Additionally, I am having her maintain her estradiol, tiZANidine, Insulin Pen Needle, HYDROcodone-acetaminophen, progesterone, DULoxetine, valACYclovir, Klor-Con M20, lidocaine, buPROPion, levothyroxine, SUMAtriptan, and ipratropium.  Meds ordered this encounter  Medications   SUMAtriptan (IMITREX) 50 MG tablet    Sig: Take 1 tablet (50 mg total) by mouth every 2 (two) hours as needed for migraine. May repeat in 2 hours if headache persists or recurs.    Dispense:  12 tablet    Refill:  6   ipratropium (ATROVENT) 0.06 % nasal spray    Sig: Place 2 sprays into both nostrils 3 (three) times daily.    Dispense:  45 mL    Refill:  3   Semaglutide-Weight Management 0.25 MG/0.5ML SOAJ    Sig: Inject 0.25 mg into the skin once a week for 28 days.    Dispense:  2 mL    Refill:  0   Semaglutide-Weight Management 0.5 MG/0.5ML SOAJ    Sig: Inject 0.5 mg into the skin once a week for 28 days.    Dispense:  2 mL    Refill:  0   Semaglutide-Weight Management 1 MG/0.5ML SOAJ    Sig: Inject 1 mg into the skin once a week for 28 days.    Dispense:  2 mL    Refill:  0   Semaglutide-Weight Management 1.7 MG/0.75ML SOAJ    Sig: Inject 1.7 mg into the skin once a week for 28 days.    Dispense:  3 mL    Refill:  0   Semaglutide-Weight Management 2.4 MG/0.75ML SOAJ    Sig: Inject 2.4 mg into the skin once a week for 28 days.    Dispense:  3 mL    Refill:  0   Pitavastatin Calcium 4 MG TABS     Sig: Take 1 tablet (4 mg total) by mouth daily.    Dispense:  90 tablet    Refill:  1   icosapent Ethyl (VASCEPA) 1 g capsule    Sig: Take 2 capsules (2 g total) by mouth 2 (two) times daily.    Dispense:  360 capsule    Refill:  1     Follow-up: Return in about 6 months (around 10/15/2022).  Scarlette Calico, MD

## 2022-04-15 NOTE — Patient Instructions (Signed)

## 2022-04-16 ENCOUNTER — Telehealth: Payer: Self-pay

## 2022-04-16 ENCOUNTER — Encounter: Payer: Self-pay | Admitting: Internal Medicine

## 2022-04-16 MED ORDER — ICOSAPENT ETHYL 1 G PO CAPS: 2.0000 g | ORAL_CAPSULE | Freq: Two times a day (BID) | ORAL | 1 refills | Status: DC

## 2022-04-16 MED ORDER — PITAVASTATIN CALCIUM 4 MG PO TABS: 4.0000 mg | ORAL_TABLET | Freq: Every day | ORAL | 1 refills | Status: DC

## 2022-04-16 NOTE — Telephone Encounter (Signed)
Key: [id number]  Approved Coverage End Date:04/16/2023

## 2022-04-21 ENCOUNTER — Encounter: Payer: Self-pay | Admitting: Podiatry

## 2022-04-21 ENCOUNTER — Ambulatory Visit (INDEPENDENT_AMBULATORY_CARE_PROVIDER_SITE_OTHER): Payer: 59 | Admitting: Podiatry

## 2022-04-21 DIAGNOSIS — L603 Nail dystrophy: Secondary | ICD-10-CM | POA: Diagnosis not present

## 2022-04-21 MED ORDER — TERBINAFINE HCL 250 MG PO TABS: 250.0000 mg | ORAL_TABLET | Freq: Every day | ORAL | 0 refills | Status: DC

## 2022-04-21 NOTE — Progress Notes (Signed)
She presents today for follow-up of her nail fungus.  She states is doing pretty well she is completed 120 days of Lamisil.  She states that is slowly looking better.  She denies fever chills nausea vomit muscle aches pains calf pain back pain chest pain shortness of breath.  States that the second toe left foot is still little tender and swollen.  Objective: Vital signs are stable alert oriented x 3 there is no erythema edema cellulitis drainage or odor to the bilateral foot with exception of a small amount around the second toe.  Toenails appear to be growing out the growing out very slowly most likely some nail dystrophy involved.  Assessment: Pain in limb secondary to long-term therapy with onychomycosis and Lamisil.  Plan: Will continue Lamisil but she will take 30 tablets 1 tablet every other day for the next 2 months and I will follow-up with her on the third month.

## 2022-04-28 ENCOUNTER — Other Ambulatory Visit: Payer: Self-pay | Admitting: Internal Medicine

## 2022-05-03 ENCOUNTER — Other Ambulatory Visit: Payer: Self-pay | Admitting: Internal Medicine

## 2022-05-03 DIAGNOSIS — F325 Major depressive disorder, single episode, in full remission: Secondary | ICD-10-CM

## 2022-05-03 DIAGNOSIS — M159 Polyosteoarthritis, unspecified: Secondary | ICD-10-CM

## 2022-05-07 ENCOUNTER — Telehealth: Payer: Self-pay

## 2022-05-07 NOTE — Telephone Encounter (Signed)
Approved Start Date:04/07/2022;Coverage End Date:09/04/2022

## 2022-05-07 NOTE — Telephone Encounter (Signed)
Key: [id number]

## 2022-06-24 ENCOUNTER — Encounter: Payer: Self-pay | Admitting: Acute Care

## 2022-06-26 ENCOUNTER — Ambulatory Visit
Admission: RE | Admit: 2022-06-26 | Discharge: 2022-06-26 | Disposition: A | Discharge status: Home / Self Care | Payer: 59 | Source: Ambulatory Visit | Attending: Acute Care | Admitting: Acute Care

## 2022-06-26 DIAGNOSIS — Z87891 Personal history of nicotine dependence: Secondary | ICD-10-CM

## 2022-06-29 ENCOUNTER — Other Ambulatory Visit: Payer: Self-pay | Admitting: Acute Care

## 2022-06-29 DIAGNOSIS — Z122 Encounter for screening for malignant neoplasm of respiratory organs: Secondary | ICD-10-CM

## 2022-06-29 DIAGNOSIS — Z87891 Personal history of nicotine dependence: Secondary | ICD-10-CM

## 2022-07-04 ENCOUNTER — Other Ambulatory Visit: Payer: Self-pay | Admitting: Internal Medicine

## 2022-07-04 DIAGNOSIS — E039 Hypothyroidism, unspecified: Secondary | ICD-10-CM

## 2022-07-05 ENCOUNTER — Other Ambulatory Visit: Payer: Self-pay | Admitting: Internal Medicine

## 2022-07-13 ENCOUNTER — Other Ambulatory Visit: Payer: Self-pay | Admitting: Internal Medicine

## 2022-07-13 DIAGNOSIS — E876 Hypokalemia: Secondary | ICD-10-CM

## 2022-07-13 DIAGNOSIS — I10 Essential (primary) hypertension: Secondary | ICD-10-CM

## 2022-07-21 ENCOUNTER — Encounter: Payer: 59 | Admitting: Podiatry

## 2022-08-10 ENCOUNTER — Ambulatory Visit (INDEPENDENT_AMBULATORY_CARE_PROVIDER_SITE_OTHER): Payer: 59 | Admitting: Podiatry

## 2022-08-10 ENCOUNTER — Encounter: Payer: Self-pay | Admitting: Podiatry

## 2022-08-10 DIAGNOSIS — L603 Nail dystrophy: Secondary | ICD-10-CM | POA: Diagnosis not present

## 2022-08-10 MED ORDER — TERBINAFINE HCL 250 MG PO TABS: 250.0000 mg | ORAL_TABLET | Freq: Every day | ORAL | 0 refills | Status: DC

## 2022-08-10 NOTE — Progress Notes (Signed)
She presents today for follow-up of her nail fungus she is completed her first every other day dose of terbinafine.  Denies any changes in her past medical history medications allergies surgery social history.  Denies any problems taking medication.  States her toenails are looking much better she is very happy with her improvement.  Objective: Vital signs stable oriented x 3 there is no erythema Dem salines drainage or odor.  Toenails appear to be healing very nicely.  Assessment: Well-healing onychomycosis secondary to long-term therapy with Lamisil.  Plan: Continue 30 tablets of Lamisil she will take these over the next 60 days I will follow-up with her in 90 days questions or concerns she will notify us immediately.

## 2022-08-16 ENCOUNTER — Other Ambulatory Visit: Payer: Self-pay | Admitting: Internal Medicine

## 2022-08-16 DIAGNOSIS — F325 Major depressive disorder, single episode, in full remission: Secondary | ICD-10-CM

## 2022-09-20 ENCOUNTER — Other Ambulatory Visit: Payer: Self-pay | Admitting: Internal Medicine

## 2022-10-02 ENCOUNTER — Other Ambulatory Visit: Payer: Self-pay | Admitting: Internal Medicine

## 2022-10-02 DIAGNOSIS — E039 Hypothyroidism, unspecified: Secondary | ICD-10-CM

## 2022-10-11 ENCOUNTER — Other Ambulatory Visit: Payer: Self-pay | Admitting: Internal Medicine

## 2022-10-11 DIAGNOSIS — E785 Hyperlipidemia, unspecified: Secondary | ICD-10-CM

## 2022-10-11 DIAGNOSIS — I2510 Atherosclerotic heart disease of native coronary artery without angina pectoris: Secondary | ICD-10-CM

## 2022-11-02 ENCOUNTER — Other Ambulatory Visit: Payer: Self-pay | Admitting: Internal Medicine

## 2022-11-02 ENCOUNTER — Telehealth: Payer: Self-pay | Admitting: Pharmacy Technician

## 2022-11-02 DIAGNOSIS — F325 Major depressive disorder, single episode, in full remission: Secondary | ICD-10-CM

## 2022-11-02 DIAGNOSIS — M159 Polyosteoarthritis, unspecified: Secondary | ICD-10-CM

## 2022-11-02 NOTE — Telephone Encounter (Signed)
Pharmacy Patient Advocate Encounter  Received notification from [employer] that Prior Authorization for [facility] 2.4MG /0.75ML auto-injectors has been APPROVED from 11/02/2022 to 11/02/2023.Marland Kitchen  PA #/Case ID/Reference #: [id number]  Key: [id number]

## 2022-11-19 ENCOUNTER — Ambulatory Visit: Payer: 59 | Admitting: Podiatry

## 2022-11-25 ENCOUNTER — Other Ambulatory Visit: Payer: Self-pay | Admitting: Internal Medicine

## 2022-11-25 DIAGNOSIS — E781 Pure hyperglyceridemia: Secondary | ICD-10-CM

## 2022-11-25 DIAGNOSIS — I2510 Atherosclerotic heart disease of native coronary artery without angina pectoris: Secondary | ICD-10-CM

## 2022-12-07 ENCOUNTER — Other Ambulatory Visit: Payer: Self-pay | Admitting: Internal Medicine

## 2022-12-07 DIAGNOSIS — E039 Hypothyroidism, unspecified: Secondary | ICD-10-CM

## 2022-12-17 ENCOUNTER — Encounter (INDEPENDENT_AMBULATORY_CARE_PROVIDER_SITE_OTHER): Payer: Self-pay

## 2022-12-24 ENCOUNTER — Other Ambulatory Visit: Payer: Self-pay | Admitting: Internal Medicine

## 2023-01-11 ENCOUNTER — Other Ambulatory Visit: Payer: Self-pay | Admitting: Internal Medicine

## 2023-01-11 DIAGNOSIS — I10 Essential (primary) hypertension: Secondary | ICD-10-CM

## 2023-01-11 DIAGNOSIS — E876 Hypokalemia: Secondary | ICD-10-CM

## 2023-01-14 ENCOUNTER — Ambulatory Visit (INDEPENDENT_AMBULATORY_CARE_PROVIDER_SITE_OTHER): Payer: 59 | Admitting: Internal Medicine

## 2023-01-14 ENCOUNTER — Encounter: Payer: Self-pay | Admitting: Internal Medicine

## 2023-01-14 VITALS — BP 128/82 | HR 76 | Temp 98.2°F | Ht 62.0 in | Wt 201.0 lb

## 2023-01-14 DIAGNOSIS — E039 Hypothyroidism, unspecified: Secondary | ICD-10-CM | POA: Diagnosis not present

## 2023-01-14 DIAGNOSIS — I10 Essential (primary) hypertension: Secondary | ICD-10-CM | POA: Diagnosis not present

## 2023-01-14 DIAGNOSIS — Z0000 Encounter for general adult medical examination without abnormal findings: Secondary | ICD-10-CM

## 2023-01-14 DIAGNOSIS — E785 Hyperlipidemia, unspecified: Secondary | ICD-10-CM

## 2023-01-14 DIAGNOSIS — R634 Abnormal weight loss: Secondary | ICD-10-CM

## 2023-01-14 DIAGNOSIS — E781 Pure hyperglyceridemia: Secondary | ICD-10-CM | POA: Diagnosis not present

## 2023-01-14 DIAGNOSIS — Z0001 Encounter for general adult medical examination with abnormal findings: Secondary | ICD-10-CM

## 2023-01-14 DIAGNOSIS — R10817 Generalized abdominal tenderness: Secondary | ICD-10-CM | POA: Insufficient documentation

## 2023-01-14 DIAGNOSIS — Z1231 Encounter for screening mammogram for malignant neoplasm of breast: Secondary | ICD-10-CM

## 2023-01-14 LAB — URINALYSIS, ROUTINE W REFLEX MICROSCOPIC
Bilirubin Urine: NEGATIVE
Hgb urine dipstick: NEGATIVE
Ketones, ur: NEGATIVE
Leukocytes,Ua: NEGATIVE
Nitrite: NEGATIVE
RBC / HPF: NONE SEEN (ref 0–?)
Specific Gravity, Urine: 1.02 (ref 1.000–1.030)
Total Protein, Urine: NEGATIVE
Urine Glucose: NEGATIVE
Urobilinogen, UA: 0.2 (ref 0.0–1.0)
pH: 6 (ref 5.0–8.0)

## 2023-01-14 LAB — CBC WITH DIFFERENTIAL/PLATELET
Basophils Absolute: 0.1 10*3/uL (ref 0.0–0.1)
Basophils Relative: 0.5 % (ref 0.0–3.0)
Eosinophils Absolute: 0.1 10*3/uL (ref 0.0–0.7)
Eosinophils Relative: 0.8 % (ref 0.0–5.0)
HCT: 45.2 % (ref 36.0–46.0)
Hemoglobin: 14.6 g/dL (ref 12.0–15.0)
Lymphocytes Relative: 30.2 % (ref 12.0–46.0)
Lymphs Abs: 3 10*3/uL (ref 0.7–4.0)
MCHC: 32.3 g/dL (ref 30.0–36.0)
MCV: 94.3 fl (ref 78.0–100.0)
Monocytes Absolute: 0.9 10*3/uL (ref 0.1–1.0)
Monocytes Relative: 9.2 % (ref 3.0–12.0)
Neutro Abs: 5.8 10*3/uL (ref 1.4–7.7)
Neutrophils Relative %: 59.3 % (ref 43.0–77.0)
Platelets: 346 10*3/uL (ref 150.0–400.0)
RBC: 4.8 Mil/uL (ref 3.87–5.11)
RDW: 13.1 % (ref 11.5–15.5)
WBC: 9.8 10*3/uL (ref 4.0–10.5)

## 2023-01-14 LAB — LIPID PANEL
Cholesterol: 221 mg/dL — ABNORMAL HIGH (ref 0–200)
HDL: 63.8 mg/dL (ref >39.00)
LDL Cholesterol: 119 mg/dL — ABNORMAL HIGH (ref 0–99)
NonHDL: 157.41
Total CHOL/HDL Ratio: 3
Triglycerides: 190 mg/dL — ABNORMAL HIGH (ref 0.0–149.0)
VLDL: 38 mg/dL (ref 0.0–40.0)

## 2023-01-14 LAB — BASIC METABOLIC PANEL
BUN: 16 mg/dL (ref 6–23)
CO2: 26 meq/L (ref 19–32)
Calcium: 9 mg/dL (ref 8.4–10.5)
Chloride: 100 meq/L (ref 96–112)
Creatinine, Ser: 0.8 mg/dL (ref 0.40–1.20)
GFR: 84.04 mL/min (ref >60.00)
Glucose, Bld: 74 mg/dL (ref 70–99)
Potassium: 4.5 meq/L (ref 3.5–5.1)
Sodium: 138 meq/L (ref 135–145)

## 2023-01-14 LAB — HEPATIC FUNCTION PANEL
ALT: 20 U/L (ref 0–35)
AST: 20 U/L (ref 0–37)
Albumin: 4.2 g/dL (ref 3.5–5.2)
Alkaline Phosphatase: 67 U/L (ref 39–117)
Bilirubin, Direct: 0.1 mg/dL (ref 0.0–0.3)
Total Bilirubin: 0.3 mg/dL (ref 0.2–1.2)
Total Protein: 7.6 g/dL (ref 6.0–8.3)

## 2023-01-14 LAB — LIPASE: Lipase: 30 U/L (ref 11.0–59.0)

## 2023-01-14 LAB — TSH: TSH: 0.68 u[IU]/mL (ref 0.35–5.50)

## 2023-01-14 NOTE — Progress Notes (Unsigned)
Subjective:  Patient ID: Holly Mcmillan, female    DOB: [birth year]  Age: 52 y.o. MRN: [id number]  CC: Annual Exam, Abdominal Pain, Hyperlipidemia, and Hypothyroidism   HPI Holly Mcmillan presents for a CPX and f/up -----  Discussed the use of AI scribe software for clinical note transcription with the patient, who gave verbal consent to proceed.  History of Present Illness   The patient, with a history of hypothyroidism, sleep apnea, and toenail fungus, has been maintaining an active lifestyle with the help of a new puppy. They deny any chest pain, shortness of breath, fatigue, cold sweats, leg swelling, palpitations, or dizziness. They have been on Wegovy (semaglutide) for weight management and report satisfaction with the results. They also report no symptoms suggestive of thyroid dysfunction.  The patient has been on terbinafine for toenail fungus, which they report has improved significantly. They plan to discontinue the medication after the current supply runs out. They have been non-compliant with sleep apnea treatment and have not been attending follow-up appointments for the same.  The patient has been experiencing intermittent abdominal pain for several months, which they describe as feeling like 'it's going to explode.' The pain has been present for a while, but the patient has been reluctant to seek medical attention due to previous extensive investigations. The pain is not associated with nausea, vomiting, changes in urination or bowel habits. However, they have a history of ovarian cysts and suspect the pain could be related.  The patient is due for a mammogram, which was missed last year, and plans to have it done during their upcoming physical examination. Their last colonoscopy, performed two years ago, was unremarkable. They have received the shingles vaccine and have declined the flu shot.       Outpatient Medications Prior to Visit  Medication Sig Dispense Refill   buPROPion  (WELLBUTRIN XL) 300 MG 24 hr tablet TAKE 1 TABLET BY MOUTH EVERY DAY 30 tablet 5   DULoxetine (CYMBALTA) 60 MG capsule TAKE 1 CAPSULE BY MOUTH EVERY DAY 30 capsule 5   estradiol (ESTRACE) 2 MG tablet Take 2-4 mg by mouth daily.     HYDROcodone-acetaminophen (NORCO/VICODIN) 5-325 MG tablet SMARTSIG:1 Tablet(s) By Mouth 1-3 Times Daily PRN     icosapent Ethyl (VASCEPA) 1 g capsule TAKE 2 CAPSULES BY MOUTH 2 TIMES DAILY. 120 capsule 5   Insulin Pen Needle 32G X 6 MM MISC 1 Act by Does not apply route daily. 100 each 1   ipratropium (ATROVENT) 0.06 % nasal spray Place 2 sprays into both nostrils 3 (three) times daily. 45 mL 3   levothyroxine (SYNTHROID) 150 MCG tablet TAKE 1 TABLET BY MOUTH EVERY DAY 30 tablet 2   lidocaine (LIDODERM) 5 % SMARTSIG:Topical     Pitavastatin Calcium 4 MG TABS TAKE 1 TABLET BY MOUTH EVERY DAY 30 tablet 5   potassium chloride SA (KLOR-CON M20) 20 MEQ tablet TAKE 1 TABLET BY MOUTH EVERY DAY 30 tablet 0   progesterone (PROMETRIUM) 100 MG capsule Take 100 mg by mouth daily.     Semaglutide-Weight Management (WEGOVY) 2.4 MG/0.75ML SOAJ INJECT 2.4 MG INTO THE SKIN ONCE A WEEK FOR 28 DAYS. 9 mL 0   SUMAtriptan (IMITREX) 50 MG tablet Take 1 tablet (50 mg total) by mouth every 2 (two) hours as needed for migraine. May repeat in 2 hours if headache persists or recurs. 12 tablet 6   terbinafine (LAMISIL) 250 MG tablet Take 1 tablet (250 mg total) by mouth daily.  30 tablet 0   tiZANidine (ZANAFLEX) 4 MG tablet Take 1 tablet (4 mg total) by mouth every 6 (six) hours as needed for muscle spasms. 30 tablet 6   valACYclovir (VALTREX) 1000 MG tablet TAKE 2 TABLETS BY MOUTH 2 TIMES DAILY FOR 2 DOSES. 4 tablet 2   No facility-administered medications prior to visit.    ROS Review of Systems  Constitutional: Negative.  Negative for chills, diaphoresis and fatigue.  HENT: Negative.  Negative for trouble swallowing.   Eyes: Negative.   Respiratory:  Negative for cough, chest tightness,  shortness of breath and wheezing.   Cardiovascular:  Negative for chest pain, palpitations and leg swelling.  Gastrointestinal:  Positive for abdominal pain. Negative for blood in stool, constipation, diarrhea, nausea and vomiting.  Endocrine: Negative.   Genitourinary: Negative.  Negative for difficulty urinating.  Musculoskeletal:  Positive for arthralgias and back pain. Negative for joint swelling and myalgias.  Skin: Negative.   Neurological: Negative.  Negative for dizziness.  Hematological:  Negative for adenopathy. Does not bruise/bleed easily.  Psychiatric/Behavioral: Negative.      Objective:  BP 128/82 (BP Location: Left Arm, Patient Position: Sitting, Cuff Size: Large)   Pulse 76   Temp 98.2 F (36.8 C) (Oral)   Ht 5\' 2"  (1.575 m)   Wt 201 lb (91.2 kg)   SpO2 98%   BMI 36.76 kg/m   BP Readings from Last 3 Encounters:  01/14/23 128/82  04/15/22 136/86  03/30/22 136/82    Wt Readings from Last 3 Encounters:  01/14/23 201 lb (91.2 kg)  04/15/22 215 lb (97.5 kg)  03/08/22 200 lb (90.7 kg)    Physical Exam Vitals reviewed.  Constitutional:      Appearance: Normal appearance.  HENT:     Mouth/Throat:     Mouth: Mucous membranes are moist.  Eyes:     General: No scleral icterus.    Conjunctiva/sclera: Conjunctivae normal.  Cardiovascular:     Rate and Rhythm: Normal rate and regular rhythm.     Heart sounds: No murmur heard. Pulmonary:     Effort: Pulmonary effort is normal.     Breath sounds: No stridor. No wheezing, rhonchi or rales.  Abdominal:     General: Abdomen is protuberant. Bowel sounds are normal. There is no distension.     Palpations: There is no hepatomegaly, splenomegaly or mass.     Tenderness: There is abdominal tenderness in the right lower quadrant, periumbilical area and suprapubic area.     Hernia: No hernia is present.  Musculoskeletal:        General: Normal range of motion.     Cervical back: Neck supple.     Right lower leg: No  edema.     Left lower leg: No edema.  Lymphadenopathy:     Cervical: No cervical adenopathy.  Skin:    General: Skin is warm and dry.  Neurological:     General: No focal deficit present.     Mental Status: She is alert. Mental status is at baseline.  Psychiatric:        Mood and Affect: Mood normal.        Behavior: Behavior normal.     Lab Results  Component Value Date   WBC 9.8 01/14/2023   HGB 14.6 01/14/2023   HCT 45.2 01/14/2023   PLT 346.0 01/14/2023   GLUCOSE 74 01/14/2023   CHOL 221 (H) 01/14/2023   TRIG 190.0 (H) 01/14/2023   HDL 63.80 01/14/2023   LDLDIRECT  194.0 04/15/2022   LDLCALC 119 (H) 01/14/2023   ALT 20 01/14/2023   AST 20 01/14/2023   NA 138 01/14/2023   K 4.5 01/14/2023   CL 100 01/14/2023   CREATININE 0.80 01/14/2023   BUN 16 01/14/2023   CO2 26 01/14/2023   TSH 0.68 01/14/2023   HGBA1C 5.5 03/16/2016    CT CHEST LUNG CA SCREEN LOW DOSE W/O CM  Result Date: 06/28/2022 CLINICAL DATA:  52 year old female former smoker (quit 3 years ago) with 24 pack-year history of smoking. Lung cancer screening examination. EXAM: CT CHEST WITHOUT CONTRAST LOW-DOSE FOR LUNG CANCER SCREENING TECHNIQUE: Multidetector CT imaging of the chest was performed following the standard protocol without IV contrast. RADIATION DOSE REDUCTION: This exam was performed according to the departmental dose-optimization program which includes automated exposure control, adjustment of the mA and/or kV according to patient size and/or use of iterative reconstruction technique. COMPARISON:  Low-dose lung cancer screening chest CT 06/26/2021. Chest CTA 03/30/2022. FINDINGS: Cardiovascular: Heart size is normal. There is no significant pericardial fluid, thickening or pericardial calcification. There is aortic atherosclerosis, as well as atherosclerosis of the great vessels of the mediastinum and the coronary arteries, including calcified atherosclerotic plaque in the left anterior descending  coronary artery. Mediastinum/Nodes: No pathologically enlarged mediastinal or hilar lymph nodes. Please note that accurate exclusion of hilar adenopathy is limited on noncontrast CT scans. Esophagus is unremarkable in appearance. No axillary lymphadenopathy. Lungs/Pleura: There are a few scattered tiny pulmonary nodules are noted, largest of which is in the posteromedial aspect of the left upper lobe (axial image 80), with a volume derived mean diameter of 3.3 mm. No larger more suspicious appearing pulmonary nodules or masses are noted. No acute consolidative airspace disease. No pleural effusions. Upper Abdomen: Status post cholecystectomy. Musculoskeletal: There are no aggressive appearing lytic or blastic lesions noted in the visualized portions of the skeleton. IMPRESSION: 1. Lung-RADS 2S, benign appearance or behavior. Continue annual screening with low-dose chest CT without contrast in 12 months. 2. The "S" modifier above refers to potentially clinically significant non lung cancer related findings. Specifically, there is aortic atherosclerosis, in addition to anterior descending coronary artery disease. Please note that although the presence of coronary artery calcium documents the presence of coronary artery disease, the severity of this disease and any potential stenosis cannot be assessed on this non-gated CT examination. Assessment for potential risk factor modification, dietary therapy or pharmacologic therapy may be warranted, if clinically indicated. Aortic Atherosclerosis (ICD10-I70.0). Electronically Signed   By: Trudie Reed M.D.   On: 06/28/2022 13:35   Assessment & Plan:  Hypertriglyceridemia, essential -     Lipid panel; Future -     Hepatic function panel; Future  Hyperlipidemia with target LDL less than 100 -     Lipid panel; Future -     TSH; Future -     Hepatic function panel; Future  Essential hypertension -     TSH; Future -     Hepatic function panel; Future -     CBC  with Differential/Platelet; Future -     Basic metabolic panel; Future  Acquired hypothyroidism -     TSH; Future -     Hepatic function panel; Future -     CBC with Differential/Platelet; Future  Generalized abdominal tenderness without rebound tenderness -     Lipase; Future -     Urinalysis, Routine w reflex microscopic; Future -     CT ABDOMEN PELVIS W CONTRAST; Future  Screening  mammogram for breast cancer -     Digital Screening Mammogram, Left and Right; Future  Encounter for general adult medical examination with abnormal findings  Weight loss, unintentional -     CT ABDOMEN PELVIS W CONTRAST; Future     Follow-up: Return in about 3 months (around 04/15/2023).  Sanda Linger, MD

## 2023-01-14 NOTE — Patient Instructions (Signed)

## 2023-01-15 DIAGNOSIS — R634 Abnormal weight loss: Secondary | ICD-10-CM | POA: Insufficient documentation

## 2023-01-27 ENCOUNTER — Other Ambulatory Visit: Payer: Self-pay | Admitting: Internal Medicine

## 2023-01-27 DIAGNOSIS — E039 Hypothyroidism, unspecified: Secondary | ICD-10-CM

## 2023-01-29 ENCOUNTER — Ambulatory Visit
Admission: RE | Admit: 2023-01-29 | Discharge: 2023-01-29 | Disposition: A | Discharge status: Home / Self Care | Payer: 59 | Source: Ambulatory Visit | Attending: Internal Medicine | Admitting: Internal Medicine

## 2023-01-29 DIAGNOSIS — R634 Abnormal weight loss: Secondary | ICD-10-CM

## 2023-01-29 DIAGNOSIS — R10817 Generalized abdominal tenderness: Secondary | ICD-10-CM

## 2023-01-29 MED ORDER — IOPAMIDOL (ISOVUE-300) INJECTION 61%
500.0000 mL | Freq: Once | INTRAVENOUS | Status: AC | PRN
  Administered 2023-01-29: 100 mL via INTRAVENOUS

## 2023-02-01 ENCOUNTER — Encounter: Payer: Self-pay | Admitting: Internal Medicine

## 2023-02-13 ENCOUNTER — Other Ambulatory Visit: Payer: Self-pay | Admitting: Internal Medicine

## 2023-02-13 DIAGNOSIS — I10 Essential (primary) hypertension: Secondary | ICD-10-CM

## 2023-02-13 DIAGNOSIS — T502X5A Adverse effect of carbonic-anhydrase inhibitors, benzothiadiazides and other diuretics, initial encounter: Secondary | ICD-10-CM

## 2023-02-13 DIAGNOSIS — E876 Hypokalemia: Secondary | ICD-10-CM

## 2023-02-15 ENCOUNTER — Other Ambulatory Visit: Payer: Self-pay | Admitting: Internal Medicine

## 2023-02-15 DIAGNOSIS — F325 Major depressive disorder, single episode, in full remission: Secondary | ICD-10-CM

## 2023-03-23 ENCOUNTER — Other Ambulatory Visit: Payer: Self-pay | Admitting: Internal Medicine

## 2023-03-23 ENCOUNTER — Encounter: Payer: Self-pay | Admitting: Internal Medicine

## 2023-03-23 ENCOUNTER — Other Ambulatory Visit: Payer: Self-pay

## 2023-03-23 DIAGNOSIS — E66812 Obesity, class 2: Secondary | ICD-10-CM

## 2023-03-23 DIAGNOSIS — E039 Hypothyroidism, unspecified: Secondary | ICD-10-CM

## 2023-03-23 MED ORDER — LEVOTHYROXINE SODIUM 150 MCG PO TABS: 150.0000 ug | ORAL_TABLET | Freq: Every day | ORAL | 2 refills | Status: DC

## 2023-03-29 ENCOUNTER — Encounter: Payer: Self-pay | Admitting: Internal Medicine

## 2023-04-05 ENCOUNTER — Telehealth: Payer: Self-pay

## 2023-04-05 ENCOUNTER — Other Ambulatory Visit (HOSPITAL_COMMUNITY): Payer: Self-pay

## 2023-04-05 NOTE — Telephone Encounter (Signed)
Patient is aware gave a verbal understanding

## 2023-04-05 NOTE — Telephone Encounter (Signed)
Patient sent [place] this message and I am a tad bit confused because this is the first time of me hearing this. " [phone] fax for insurance. They say that they need a plan limit authorization from Dr Yetta Barre." Let me know if you wonderful people handle this or do I need to figure this one out.

## 2023-04-08 ENCOUNTER — Other Ambulatory Visit (HOSPITAL_COMMUNITY): Payer: Self-pay

## 2023-04-08 ENCOUNTER — Telehealth: Payer: Self-pay | Admitting: Pharmacist

## 2023-04-08 NOTE — Telephone Encounter (Signed)
Pharmacy Patient Advocate Encounter  Received notification from [facility] [employer] that Prior Authorization for Icosapent Ethyl 1GM capsules has been APPROVED from 03/09/2023 to 04/07/2024. Ran test claim, Copay is $0.00. This test claim was processed through [facility]- copay amounts may vary at other pharmacies due to pharmacy/plan contracts, or as the patient moves through the different stages of their insurance plan.   PA #/Case ID/Reference #: [id number]

## 2023-04-08 NOTE — Telephone Encounter (Signed)
Pharmacy Patient Advocate Encounter   Received notification from CoverMyMeds that prior authorization for Icosapent Ethyl 1GM capsules is required/requested.   Insurance verification completed.   The patient is insured through [employer] .   Per test claim: PA required; PA submitted to above mentioned insurance via CoverMyMeds Key/confirmation #/EOC [id number] Status is pending

## 2023-04-12 ENCOUNTER — Other Ambulatory Visit: Payer: Self-pay | Admitting: Internal Medicine

## 2023-04-12 DIAGNOSIS — E785 Hyperlipidemia, unspecified: Secondary | ICD-10-CM

## 2023-04-12 DIAGNOSIS — F325 Major depressive disorder, single episode, in full remission: Secondary | ICD-10-CM

## 2023-04-12 DIAGNOSIS — I2510 Atherosclerotic heart disease of native coronary artery without angina pectoris: Secondary | ICD-10-CM

## 2023-04-12 DIAGNOSIS — M150 Primary generalized (osteo)arthritis: Secondary | ICD-10-CM

## 2023-04-20 ENCOUNTER — Other Ambulatory Visit: Payer: Self-pay | Admitting: Internal Medicine

## 2023-04-20 DIAGNOSIS — E039 Hypothyroidism, unspecified: Secondary | ICD-10-CM

## 2023-05-11 ENCOUNTER — Other Ambulatory Visit: Payer: Self-pay | Admitting: Internal Medicine

## 2023-05-11 DIAGNOSIS — E876 Hypokalemia: Secondary | ICD-10-CM

## 2023-05-11 DIAGNOSIS — I10 Essential (primary) hypertension: Secondary | ICD-10-CM

## 2023-06-04 ENCOUNTER — Encounter: Payer: Self-pay | Admitting: Family Medicine

## 2023-06-04 ENCOUNTER — Ambulatory Visit (INDEPENDENT_AMBULATORY_CARE_PROVIDER_SITE_OTHER): Payer: 59 | Admitting: Family Medicine

## 2023-06-04 VITALS — BP 128/84 | HR 94 | Temp 97.8°F | Ht 62.0 in | Wt 203.0 lb

## 2023-06-04 DIAGNOSIS — R52 Pain, unspecified: Secondary | ICD-10-CM | POA: Diagnosis not present

## 2023-06-04 DIAGNOSIS — R051 Acute cough: Secondary | ICD-10-CM | POA: Diagnosis not present

## 2023-06-04 DIAGNOSIS — J101 Influenza due to other identified influenza virus with other respiratory manifestations: Secondary | ICD-10-CM | POA: Diagnosis not present

## 2023-06-04 LAB — POCT INFLUENZA A/B
Influenza A, POC: POSITIVE — AB
Influenza B, POC: NEGATIVE

## 2023-06-04 LAB — POC COVID19 BINAXNOW: SARS Coronavirus 2 Ag: NEGATIVE

## 2023-06-04 MED ORDER — OSELTAMIVIR PHOSPHATE 75 MG PO CAPS
75.0000 mg | ORAL_CAPSULE | Freq: Two times a day (BID) | ORAL | 0 refills | Status: DC
Start: 2023-06-04 — End: 2023-09-01

## 2023-06-04 MED ORDER — BENZONATATE 200 MG PO CAPS: 200.0000 mg | ORAL_CAPSULE | Freq: Two times a day (BID) | ORAL | 0 refills | Status: DC | PRN

## 2023-06-04 NOTE — Patient Instructions (Signed)
You are positive for influenza A.  You can take Tamiflu which I prescribed as long as you started today.  If you start it after today then it most likely it will not be effective.  Continue treating her symptoms with Tylenol, Mucinex and stay hydrated.  You are contagious for the next 4 to 5 days and should wear your mask in your home around your family to avoid getting them sick or out in public.

## 2023-06-04 NOTE — Progress Notes (Signed)
Subjective:  Holly Mcmillan is a 53 y.o. female who presents for a flu-like symptoms that started yesterday.   Complains of severe body aches, nasal congestion and cough.  Denies dizziness, chest pain, palpitations, shortness of breath, wheezing, abdominal pain, vomiting or diarrhea.   ROS as in subjective.   Objective: Vitals:   06/04/23 1120  BP: 128/84  Pulse: 94  Temp: 97.8 F (36.6 C)  SpO2: 99%    General appearance: Alert, WD/WN, no distress, mildly ill appearing                             Skin: warm, no rash                                                     Eyes: conjunctiva normal, corneas clear, PERRLA                 Neck: supple, no adenopathy, no thyromegaly, nontender                          Heart: RRR                         Lungs: CTA bilaterally, no wheezes, rales, or rhonchi      Assessment: Influenza A - Plan: oseltamivir (TAMIFLU) 75 MG capsule  Acute cough - Plan: POC COVID-19, POCT Influenza A/B  Body aches - Plan: POC COVID-19, POCT Influenza A/B   Plan: Influenza A infection. We discussed Tamiflu is an option and she would like to take it.  Tamiflu and Tessalon Perles prescribed.  Suggested symptomatic OTC remedies. Nasal saline spray for congestion.  Tylenol prn.   Discussed potential complications related to flu and the follow-up if getting significantly worse or not improving by next week.

## 2023-06-07 ENCOUNTER — Other Ambulatory Visit: Payer: Self-pay | Admitting: Family Medicine

## 2023-06-07 ENCOUNTER — Encounter: Payer: Self-pay | Admitting: Internal Medicine

## 2023-06-07 ENCOUNTER — Encounter: Payer: Self-pay | Admitting: Family Medicine

## 2023-06-07 MED ORDER — FLUTICASONE PROPIONATE 50 MCG/ACT NA SUSP: 2.0000 | Freq: Every day | NASAL | 6 refills | Status: DC

## 2023-06-07 MED ORDER — PROMETHAZINE-DM 6.25-15 MG/5ML PO SYRP: 5.0000 mL | ORAL_SOLUTION | Freq: Every evening | ORAL | 0 refills | Status: DC | PRN

## 2023-06-14 ENCOUNTER — Other Ambulatory Visit: Payer: Self-pay | Admitting: Internal Medicine

## 2023-06-14 DIAGNOSIS — G43011 Migraine without aura, intractable, with status migrainosus: Secondary | ICD-10-CM

## 2023-06-27 ENCOUNTER — Other Ambulatory Visit: Payer: Self-pay | Admitting: Internal Medicine

## 2023-06-27 DIAGNOSIS — E66812 Obesity, class 2: Secondary | ICD-10-CM

## 2023-06-28 ENCOUNTER — Ambulatory Visit
Admission: RE | Admit: 2023-06-28 | Discharge: 2023-06-28 | Disposition: A | Discharge status: Home / Self Care | Payer: 59 | Source: Ambulatory Visit | Attending: Acute Care | Admitting: Acute Care

## 2023-06-28 DIAGNOSIS — Z122 Encounter for screening for malignant neoplasm of respiratory organs: Secondary | ICD-10-CM

## 2023-06-28 DIAGNOSIS — Z87891 Personal history of nicotine dependence: Secondary | ICD-10-CM

## 2023-07-20 ENCOUNTER — Other Ambulatory Visit: Payer: Self-pay

## 2023-07-20 DIAGNOSIS — Z122 Encounter for screening for malignant neoplasm of respiratory organs: Secondary | ICD-10-CM

## 2023-07-20 DIAGNOSIS — Z87891 Personal history of nicotine dependence: Secondary | ICD-10-CM

## 2023-07-28 ENCOUNTER — Other Ambulatory Visit: Payer: Self-pay | Admitting: Internal Medicine

## 2023-07-28 DIAGNOSIS — E66812 Obesity, class 2: Secondary | ICD-10-CM

## 2023-07-28 DIAGNOSIS — J301 Allergic rhinitis due to pollen: Secondary | ICD-10-CM

## 2023-07-30 ENCOUNTER — Other Ambulatory Visit: Payer: Self-pay | Admitting: Internal Medicine

## 2023-07-30 DIAGNOSIS — E876 Hypokalemia: Secondary | ICD-10-CM

## 2023-07-30 DIAGNOSIS — I10 Essential (primary) hypertension: Secondary | ICD-10-CM

## 2023-08-02 ENCOUNTER — Encounter: Payer: Self-pay | Admitting: Internal Medicine

## 2023-08-04 ENCOUNTER — Other Ambulatory Visit: Payer: Self-pay | Admitting: Internal Medicine

## 2023-08-04 DIAGNOSIS — E66812 Obesity, class 2: Secondary | ICD-10-CM

## 2023-08-04 DIAGNOSIS — J301 Allergic rhinitis due to pollen: Secondary | ICD-10-CM

## 2023-08-13 ENCOUNTER — Other Ambulatory Visit: Payer: Self-pay | Admitting: Internal Medicine

## 2023-08-13 DIAGNOSIS — F325 Major depressive disorder, single episode, in full remission: Secondary | ICD-10-CM

## 2023-08-13 DIAGNOSIS — I2510 Atherosclerotic heart disease of native coronary artery without angina pectoris: Secondary | ICD-10-CM

## 2023-08-13 DIAGNOSIS — M150 Primary generalized (osteo)arthritis: Secondary | ICD-10-CM

## 2023-08-13 DIAGNOSIS — E785 Hyperlipidemia, unspecified: Secondary | ICD-10-CM

## 2023-08-13 DIAGNOSIS — E039 Hypothyroidism, unspecified: Secondary | ICD-10-CM

## 2023-08-31 ENCOUNTER — Other Ambulatory Visit: Payer: Self-pay | Admitting: Internal Medicine

## 2023-08-31 DIAGNOSIS — E66812 Obesity, class 2: Secondary | ICD-10-CM

## 2023-09-01 ENCOUNTER — Encounter: Payer: Self-pay | Admitting: Internal Medicine

## 2023-09-01 ENCOUNTER — Ambulatory Visit: Admitting: Internal Medicine

## 2023-09-01 ENCOUNTER — Other Ambulatory Visit: Payer: Self-pay | Admitting: Internal Medicine

## 2023-09-01 VITALS — BP 122/88 | HR 84 | Temp 98.0°F | Ht 62.0 in | Wt 204.6 lb

## 2023-09-01 DIAGNOSIS — I10 Essential (primary) hypertension: Secondary | ICD-10-CM

## 2023-09-01 DIAGNOSIS — E039 Hypothyroidism, unspecified: Secondary | ICD-10-CM | POA: Diagnosis not present

## 2023-09-01 DIAGNOSIS — K862 Cyst of pancreas: Secondary | ICD-10-CM | POA: Insufficient documentation

## 2023-09-01 DIAGNOSIS — E781 Pure hyperglyceridemia: Secondary | ICD-10-CM

## 2023-09-01 DIAGNOSIS — L600 Ingrowing nail: Secondary | ICD-10-CM | POA: Diagnosis not present

## 2023-09-01 DIAGNOSIS — R10817 Generalized abdominal tenderness: Secondary | ICD-10-CM | POA: Diagnosis not present

## 2023-09-01 DIAGNOSIS — T502X5A Adverse effect of carbonic-anhydrase inhibitors, benzothiadiazides and other diuretics, initial encounter: Secondary | ICD-10-CM

## 2023-09-01 DIAGNOSIS — N281 Cyst of kidney, acquired: Secondary | ICD-10-CM | POA: Insufficient documentation

## 2023-09-01 LAB — URINALYSIS, ROUTINE W REFLEX MICROSCOPIC
Bilirubin Urine: NEGATIVE
Hgb urine dipstick: NEGATIVE
Ketones, ur: NEGATIVE
Leukocytes,Ua: NEGATIVE
Nitrite: NEGATIVE
Specific Gravity, Urine: >=1.03 — AB (ref 1.000–1.030)
Urine Glucose: NEGATIVE
Urobilinogen, UA: 1 (ref 0.0–1.0)
pH: 6 (ref 5.0–8.0)

## 2023-09-01 LAB — CBC WITH DIFFERENTIAL/PLATELET
Basophils Absolute: 0.1 10*3/uL (ref 0.0–0.1)
Basophils Relative: 0.6 % (ref 0.0–3.0)
Eosinophils Absolute: 0.1 10*3/uL (ref 0.0–0.7)
Eosinophils Relative: 0.6 % (ref 0.0–5.0)
HCT: 44.6 % (ref 36.0–46.0)
Hemoglobin: 15 g/dL (ref 12.0–15.0)
Lymphocytes Relative: 28.5 % (ref 12.0–46.0)
Lymphs Abs: 2.9 10*3/uL (ref 0.7–4.0)
MCHC: 33.7 g/dL (ref 30.0–36.0)
MCV: 93.3 fl (ref 78.0–100.0)
Monocytes Absolute: 0.9 10*3/uL (ref 0.1–1.0)
Monocytes Relative: 8.4 % (ref 3.0–12.0)
Neutro Abs: 6.4 10*3/uL (ref 1.4–7.7)
Neutrophils Relative %: 61.9 % (ref 43.0–77.0)
Platelets: 338 10*3/uL (ref 150.0–400.0)
RBC: 4.78 Mil/uL (ref 3.87–5.11)
RDW: 12.9 % (ref 11.5–15.5)
WBC: 10.3 10*3/uL (ref 4.0–10.5)

## 2023-09-01 LAB — HEPATIC FUNCTION PANEL
ALT: 19 U/L (ref 0–35)
AST: 20 U/L (ref 0–37)
Albumin: 4.2 g/dL (ref 3.5–5.2)
Alkaline Phosphatase: 71 U/L (ref 39–117)
Bilirubin, Direct: 0 mg/dL (ref 0.0–0.3)
Total Bilirubin: 0.4 mg/dL (ref 0.2–1.2)
Total Protein: 7.5 g/dL (ref 6.0–8.3)

## 2023-09-01 LAB — BASIC METABOLIC PANEL WITH GFR
BUN: 19 mg/dL (ref 6–23)
CO2: 28 meq/L (ref 19–32)
Calcium: 8.9 mg/dL (ref 8.4–10.5)
Chloride: 104 meq/L (ref 96–112)
Creatinine, Ser: 0.85 mg/dL (ref 0.40–1.20)
GFR: 77.8 mL/min (ref >60.00)
Glucose, Bld: 78 mg/dL (ref 70–99)
Potassium: 4.2 meq/L (ref 3.5–5.1)
Sodium: 140 meq/L (ref 135–145)

## 2023-09-01 LAB — TRIGLYCERIDES: Triglycerides: 137 mg/dL (ref 0.0–149.0)

## 2023-09-01 LAB — TSH: TSH: 0.15 u[IU]/mL — ABNORMAL LOW (ref 0.35–5.50)

## 2023-09-01 LAB — LIPASE: Lipase: 31 U/L (ref 11.0–59.0)

## 2023-09-01 MED ORDER — UNITHROID 125 MCG PO TABS: 125.0000 ug | ORAL_TABLET | Freq: Every day | ORAL | 1 refills | Status: DC

## 2023-09-01 NOTE — Patient Instructions (Signed)
 Abdominal Pain, Adult  Pain in the abdomen (abdominal pain) can be caused by many things. In most cases, it gets better with no treatment or by being treated at home. But in some cases, it can be serious. Your health care provider will ask questions about your medical history and do a physical exam to try to figure out what is causing your pain. Follow these instructions at home: Medicines Take over-the-counter and prescription medicines only as told by your provider. Do not take medicines that help you poop (laxatives) unless told by your provider. General instructions Watch your condition for any changes. Drink enough fluid to keep your pee (urine) pale yellow. Contact a health care provider if: Your pain changes, gets worse, or lasts longer than expected. You have severe cramping or bloating in your abdomen, or you vomit. Your pain gets worse with meals, after eating, or with certain foods. You are constipated or have diarrhea for more than 2-3 days. You are not hungry, or you lose weight without trying. You have signs of dehydration. These may include: Dark pee, very little pee, or no pee. Cracked lips or dry mouth. Sleepiness or weakness. You have pain when you pee (urinate) or poop. Your abdominal pain wakes you up at night. You have blood in your pee. You have a fever. Get help right away if: You cannot stop vomiting. Your pain is only in one part of the abdomen. Pain on the right side could be caused by appendicitis. You have bloody or black poop (stool), or poop that looks like tar. You have trouble breathing. You have chest pain. These symptoms may be an emergency. Get help right away. Call 911. Do not wait to see if the symptoms will go away. Do not drive yourself to the hospital. This information is not intended to replace advice given to you by your health care provider. Make sure you discuss any questions you have with your health care provider. Document Revised:  02/04/2022 Document Reviewed: 02/04/2022 Elsevier Patient Education  2024 [employer].

## 2023-09-01 NOTE — Progress Notes (Signed)
 Subjective:  Patient ID: Holly Mcmillan, female    DOB: 1970/06/11  Age: 53 y.o. MRN: [id number]  CC: Abdominal Pain   HPI Holly Mcmillan presents for f/up ---    Discussed the use of AI scribe software for clinical note transcription with the patient, who gave verbal consent to proceed.  History of Present Illness   Holly Mcmillan is a 53 year old female who presents with abdominal pain and a pancreatic cyst.  She experiences sharp, crampy abdominal pain described as feeling like 'it's gonna explode.' The pain does not affect her bowel movements, which remain regular, although she occasionally experiences constipation due to her weekly Wegovy  medication. No weight loss, nausea, vomiting, chest pain, shortness of breath, fevers, chills, painful urination, or hematuria. No changes in bowel habits aside from the occasional constipation related to Wegovy .  A pancreatic cyst was discovered on a CT scan last year. She has a family history of pancreatic cancer, as her aunt passed away from the disease at the age of 23.  Her past medical history includes a thyroid  condition, with her last thyroid  level check in September of the previous year. She denies any symptoms of hypothyroidism other than constipation. She has stopped taking hydrocodone  and reports feeling better overall.  She does not smoke or consume alcohol. She has had her gallbladder removed and underwent ablation surgery, which has stopped her menstrual bleeding, although she still experiences ovulation-related symptoms. She reports improvement in her headaches.       Outpatient Medications Prior to Visit  Medication Sig Dispense Refill   buPROPion  (WELLBUTRIN  XL) 300 MG 24 hr tablet TAKE 1 TABLET BY MOUTH EVERY DAY 30 tablet 5   DULoxetine  (CYMBALTA ) 60 MG capsule TAKE 1 CAPSULE BY MOUTH EVERY DAY 30 capsule 3   estradiol (ESTRACE) 2 MG tablet Take 2-4 mg by mouth daily.     fluticasone  (FLONASE ) 50 MCG/ACT nasal spray Place 2 sprays  into both nostrils daily. 16 g 6   icosapent  Ethyl (VASCEPA ) 1 g capsule TAKE 2 CAPSULES BY MOUTH 2 TIMES DAILY. 120 capsule 5   Insulin  Pen Needle 32G X 6 MM MISC 1 Act by Does not apply route daily. 100 each 1   ipratropium (ATROVENT ) 0.06 % nasal spray PLACE 2 SPRAYS INTO BOTH NOSTRILS 3 (THREE) TIMES DAILY 15 mL 1   lidocaine  (LIDODERM ) 5 % SMARTSIG:Topical     Pitavastatin  Calcium  4 MG TABS TAKE 1 TABLET BY MOUTH EVERY DAY 30 tablet 3   potassium chloride  SA (KLOR-CON  M) 20 MEQ tablet Take 1 tablet (20 mEq total) by mouth daily. Schedule an appt for further refills 30 tablet 0   Semaglutide -Weight Management (WEGOVY ) 2.4 MG/0.75ML SOAJ INJECT 2.4 MG INTO THE SKIN ONCE A WEEK FOR 28 DAYS. 3 mL 0   SUMAtriptan  (IMITREX ) 50 MG tablet TAKE 1 TAB EVERY 2 HOURS AS NEEDED FOR MIGRAINE. MAY REPEAT IN 2 HOURS IF HEADACHE PERSISTS ORRECURS 9 tablet 2   tiZANidine  (ZANAFLEX ) 4 MG tablet Take 1 tablet (4 mg total) by mouth every 6 (six) hours as needed for muscle spasms. 30 tablet 6   valACYclovir  (VALTREX ) 1000 MG tablet TAKE 2 TABLETS BY MOUTH 2 TIMES DAILY FOR 2 DOSES. 4 tablet 2   benzonatate  (TESSALON ) 200 MG capsule Take 1 capsule (200 mg total) by mouth 2 (two) times daily as needed for cough. 20 capsule 0   HYDROcodone -acetaminophen  (NORCO/VICODIN) 5-325 MG tablet SMARTSIG:1 Tablet(s) By Mouth 1-3 Times Daily PRN  levothyroxine  (SYNTHROID ) 150 MCG tablet TAKE 1 TABLET BY MOUTH EVERY DAY 30 tablet 2   oseltamivir  (TAMIFLU ) 75 MG capsule Take 1 capsule (75 mg total) by mouth 2 (two) times daily. 10 capsule 0   progesterone (PROMETRIUM) 100 MG capsule Take 100 mg by mouth daily.     promethazine -dextromethorphan (PROMETHAZINE -DM) 6.25-15 MG/5ML syrup Take 5 mLs by mouth at bedtime as needed for cough. 118 mL 0   terbinafine  (LAMISIL ) 250 MG tablet Take 1 tablet (250 mg total) by mouth daily. 30 tablet 0   No facility-administered medications prior to visit.    ROS Review of Systems   Constitutional:  Negative for appetite change, chills, diaphoresis, fatigue and fever.  HENT: Negative.    Eyes: Negative.   Respiratory:  Negative for chest tightness, shortness of breath and wheezing.   Cardiovascular:  Negative for chest pain, palpitations and leg swelling.  Gastrointestinal:  Positive for abdominal pain and constipation. Negative for nausea and vomiting.  Endocrine: Negative.   Genitourinary: Negative.  Negative for difficulty urinating.  Musculoskeletal: Negative.   Skin: Negative.   Neurological:  Negative for dizziness and weakness.  Hematological:  Negative for adenopathy. Does not bruise/bleed easily.  Psychiatric/Behavioral: Negative.      Objective:  BP 122/88 (BP Location: Left Arm, Patient Position: Sitting)   Pulse 84   Temp 98 F (36.7 C) (Temporal)   Ht 5\' 2"  (1.575 m)   Wt 204 lb 9.6 oz (92.8 kg)   SpO2 96%   BMI 37.42 kg/m   BP Readings from Last 3 Encounters:  09/01/23 122/88  06/04/23 128/84  01/14/23 128/82    Wt Readings from Last 3 Encounters:  09/01/23 204 lb 9.6 oz (92.8 kg)  06/04/23 203 lb (92.1 kg)  01/14/23 201 lb (91.2 kg)    Physical Exam Vitals reviewed.  Constitutional:      Appearance: She is well-developed.  HENT:     Mouth/Throat:     Mouth: Mucous membranes are moist.  Eyes:     General: No scleral icterus.    Conjunctiva/sclera: Conjunctivae normal.  Cardiovascular:     Rate and Rhythm: Normal rate and regular rhythm.     Heart sounds: No murmur heard.    No friction rub. No gallop.  Pulmonary:     Effort: Pulmonary effort is normal.     Breath sounds: No stridor. No wheezing, rhonchi or rales.  Abdominal:     General: Abdomen is protuberant. Bowel sounds are normal. There is no distension.     Palpations: There is no hepatomegaly, splenomegaly or mass.     Tenderness: There is abdominal tenderness in the right upper quadrant and epigastric area. There is no guarding or rebound.     Hernia: No hernia  is present.  Musculoskeletal:        General: Normal range of motion.     Cervical back: Neck supple.     Right lower leg: No edema.     Left lower leg: No edema.  Lymphadenopathy:     Cervical: No cervical adenopathy.  Skin:    General: Skin is warm and dry.  Neurological:     General: No focal deficit present.     Mental Status: She is alert. Mental status is at baseline.  Psychiatric:        Mood and Affect: Mood normal.        Behavior: Behavior normal.     Lab Results  Component Value Date   WBC 10.3 09/01/2023  HGB 15.0 09/01/2023   HCT 44.6 09/01/2023   PLT 338.0 09/01/2023   GLUCOSE 78 09/01/2023   CHOL 221 (H) 01/14/2023   TRIG 137.0 09/01/2023   HDL 63.80 01/14/2023   LDLDIRECT 194.0 04/15/2022   LDLCALC 119 (H) 01/14/2023   ALT 19 09/01/2023   AST 20 09/01/2023   NA 140 09/01/2023   K 4.2 09/01/2023   CL 104 09/01/2023   CREATININE 0.85 09/01/2023   BUN 19 09/01/2023   CO2 28 09/01/2023   TSH 0.15 (L) 09/01/2023   HGBA1C 5.5 03/16/2016    CT CHEST LUNG CA SCREEN LOW DOSE W/O CM Result Date: 07/19/2023 CLINICAL DATA:  Former 24 pack-year smoker, quit 4 years ago. EXAM: CT CHEST WITHOUT CONTRAST LOW-DOSE FOR LUNG CANCER SCREENING TECHNIQUE: Multidetector CT imaging of the chest was performed following the standard protocol without IV contrast. RADIATION DOSE REDUCTION: This exam was performed according to the departmental dose-optimization program which includes automated exposure control, adjustment of the mA and/or kV according to patient size and/or use of iterative reconstruction technique. COMPARISON:  06/26/2022. FINDINGS: Cardiovascular: Age advanced left anterior descending coronary artery calcification. Heart size normal. No pericardial effusion. Mediastinum/Nodes: Thyroidectomy. No pathologically enlarged mediastinal or axillary lymph nodes. Hilar regions are difficult to definitively evaluate without IV contrast. Esophagus is grossly unremarkable.  Lungs/Pleura: Mild centrilobular emphysema. Pulmonary nodules measure 4.0 mm or less in size, as before. No new pulmonary nodules. No pleural fluid. Airway is unremarkable. Upper Abdomen: Cholecystectomy. Visualized portions of the liver, adrenal glands, kidneys, spleen, pancreas, stomach and bowel are grossly unremarkable. No upper abdominal adenopathy. Musculoskeletal: Degenerative changes in the spine. IMPRESSION: 1. Lung-RADS 2, benign appearance or behavior. Continue annual screening with low-dose chest CT without contrast in 12 months. 2. Age advanced left anterior descending coronary artery calcification. 3.  Emphysema (ICD10-J43.9). Electronically Signed   By: Shearon Denis M.D.   On: 07/19/2023 14:37   CT CHEST LUNG CA SCREEN LOW DOSE W/O CM Result Date: 07/19/2023 CLINICAL DATA:  Former 24 pack-year smoker, quit 4 years ago. EXAM: CT CHEST WITHOUT CONTRAST LOW-DOSE FOR LUNG CANCER SCREENING TECHNIQUE: Multidetector CT imaging of the chest was performed following the standard protocol without IV contrast. RADIATION DOSE REDUCTION: This exam was performed according to the departmental dose-optimization program which includes automated exposure control, adjustment of the mA and/or kV according to patient size and/or use of iterative reconstruction technique. COMPARISON:  06/26/2022. FINDINGS: Cardiovascular: Age advanced left anterior descending coronary artery calcification. Heart size normal. No pericardial effusion. Mediastinum/Nodes: Thyroidectomy. No pathologically enlarged mediastinal or axillary lymph nodes. Hilar regions are difficult to definitively evaluate without IV contrast. Esophagus is grossly unremarkable. Lungs/Pleura: Mild centrilobular emphysema. Pulmonary nodules measure 4.0 mm or less in size, as before. No new pulmonary nodules. No pleural fluid. Airway is unremarkable. Upper Abdomen: Cholecystectomy. Visualized portions of the liver, adrenal glands, kidneys, spleen, pancreas,  stomach and bowel are grossly unremarkable. No upper abdominal adenopathy. Musculoskeletal: Degenerative changes in the spine. IMPRESSION: 1. Lung-RADS 2, benign appearance or behavior. Continue annual screening with low-dose chest CT without contrast in 12 months. 2. Age advanced left anterior descending coronary artery calcification. 3.  Emphysema (ICD10-J43.9). Electronically Signed   By: Shearon Denis M.D.   On: 07/19/2023 14:37      Assessment & Plan:  Ingrown toenail -     Ambulatory referral to Podiatry  Generalized abdominal tenderness without rebound tenderness -     Urinalysis, Routine w reflex microscopic; Future -  Hepatic function panel; Future -     Lipase; Future -     CBC with Differential/Platelet; Future -     MR ABDOMEN W WO CONTRAST; Future  Acquired hypothyroidism- Her TSH is suppressed. Will lower her T4 dose. -     TSH; Future -     Unithroid ; Take 1 tablet (125 mcg total) by mouth daily before breakfast.  Dispense: 90 tablet; Refill: 1  Hypertriglyceridemia, essential -     Triglycerides; Future  Essential hypertension- BP is well controlled. -     TSH; Future -     Urinalysis, Routine w reflex microscopic; Future -     CBC with Differential/Platelet; Future -     Basic metabolic panel with GFR; Future  Renal cyst -     MR ABDOMEN W WO CONTRAST; Future  Pancreatic cyst -     MR ABDOMEN W WO CONTRAST; Future     Follow-up: Return in about 3 months (around 12/01/2023).  Sandra Crouch, MD

## 2023-09-02 ENCOUNTER — Encounter: Payer: Self-pay | Admitting: Internal Medicine

## 2023-09-03 ENCOUNTER — Other Ambulatory Visit: Payer: Self-pay

## 2023-09-03 MED ORDER — WEGOVY 2.4 MG/0.75ML ~~LOC~~ SOAJ: 2.4000 mg | SUBCUTANEOUS | 0 refills | Status: DC

## 2023-09-09 ENCOUNTER — Ambulatory Visit (INDEPENDENT_AMBULATORY_CARE_PROVIDER_SITE_OTHER)

## 2023-09-09 ENCOUNTER — Encounter: Payer: Self-pay | Admitting: Podiatry

## 2023-09-09 ENCOUNTER — Ambulatory Visit: Admitting: Podiatry

## 2023-09-09 DIAGNOSIS — M7752 Other enthesopathy of left foot: Secondary | ICD-10-CM

## 2023-09-09 DIAGNOSIS — D2372 Other benign neoplasm of skin of left lower limb, including hip: Secondary | ICD-10-CM

## 2023-09-09 DIAGNOSIS — L600 Ingrowing nail: Secondary | ICD-10-CM | POA: Diagnosis not present

## 2023-09-09 DIAGNOSIS — M205X2 Other deformities of toe(s) (acquired), left foot: Secondary | ICD-10-CM | POA: Diagnosis not present

## 2023-09-09 MED ORDER — NEOMYCIN-POLYMYXIN-HC 1 % OT SOLN: OTIC | 1 refills | Status: DC

## 2023-09-09 NOTE — Patient Instructions (Signed)

## 2023-09-12 ENCOUNTER — Other Ambulatory Visit: Payer: Self-pay | Admitting: Internal Medicine

## 2023-09-12 DIAGNOSIS — G43011 Migraine without aura, intractable, with status migrainosus: Secondary | ICD-10-CM

## 2023-09-13 NOTE — Progress Notes (Signed)
 She presents today primary complaint of ingrown toenail to the lateral border of the left hallux.  She also states that she presents today primary complaint of ingrown toenail to the lateral border of the left hallux.  She also states that we will remove the cyst on the second toe sometimes overlaps the toe next to it if she refers to the hallux due to his juxtaposition.  She also has some pain in the midfoot same foot with some cramping and she also has a painful lesion subfifth metatarsal of the same foot.  Objective: Vital signs are stable alert and oriented x 3.  Pulses are palpable.  Ingrown nail fibular border of the hallux left.  There is mild erythema no purulence no mall odor noted cellulitic process.  She does have a small benign skin lesion plantar aspect of the fifth metatarsal head left foot.  Assessment: Pain in limb secondary to ingrown nail benign skin lesion.  Plan: Chemical destruction matrixectomy was performed today fibular border the hallux left tolerated the border excision well after local anesthetic was administered.  She tolerated that also.  She is given both oral written home-going instruction for the care and soaking of the toe.  She is also received a prescription for Cortisporin Otic to be applied twice daily after soaking.  I debrided the benign skin lesion plantar aspect of the fifth metatarsal head.  Follow-up with her in 2 weeks.

## 2023-09-14 ENCOUNTER — Ambulatory Visit
Admission: RE | Admit: 2023-09-14 | Discharge: 2023-09-14 | Disposition: A | Discharge status: Home / Self Care | Source: Ambulatory Visit | Attending: Internal Medicine | Admitting: Internal Medicine

## 2023-09-14 DIAGNOSIS — K862 Cyst of pancreas: Secondary | ICD-10-CM

## 2023-09-14 DIAGNOSIS — N281 Cyst of kidney, acquired: Secondary | ICD-10-CM

## 2023-09-14 DIAGNOSIS — R10817 Generalized abdominal tenderness: Secondary | ICD-10-CM

## 2023-09-14 MED ORDER — GADOPICLENOL 0.5 MMOL/ML IV SOLN
9.0000 mL | Freq: Once | INTRAVENOUS | Status: AC | PRN
  Administered 2023-09-14: 9 mL via INTRAVENOUS

## 2023-09-15 ENCOUNTER — Other Ambulatory Visit: Payer: Self-pay | Admitting: Internal Medicine

## 2023-09-15 ENCOUNTER — Ambulatory Visit: Payer: Self-pay | Admitting: Internal Medicine

## 2023-09-15 DIAGNOSIS — K581 Irritable bowel syndrome with constipation: Secondary | ICD-10-CM | POA: Insufficient documentation

## 2023-09-15 MED ORDER — TRULANCE 3 MG PO TABS: 1.0000 | ORAL_TABLET | Freq: Every day | ORAL | 1 refills | Status: AC

## 2023-09-23 ENCOUNTER — Ambulatory Visit (INDEPENDENT_AMBULATORY_CARE_PROVIDER_SITE_OTHER): Admitting: Podiatry

## 2023-09-23 DIAGNOSIS — L600 Ingrowing nail: Secondary | ICD-10-CM

## 2023-09-23 DIAGNOSIS — Z9889 Other specified postprocedural states: Secondary | ICD-10-CM

## 2023-09-23 NOTE — Progress Notes (Signed)
 She presents today for a follow-up of her matrixectomy.  She states that she has been doing very well with that no problems whatsoever she refers to the fibular border of the hallux left.  Objective: Vital signs are stable alert oriented x 3 there is no erythema edema salines drainage odor appears to be healing very nicely.  Assessment: Well-healing surgical toe.  Plan: Encouraged her to continue to soak Epsom salts warm water every other day covered in day leave open at bedtime and to completely heal.

## 2023-09-29 ENCOUNTER — Other Ambulatory Visit: Payer: Self-pay | Admitting: Internal Medicine

## 2023-09-29 DIAGNOSIS — E66812 Body mass index (BMI) 39.0-39.9, adult: Secondary | ICD-10-CM

## 2023-10-18 ENCOUNTER — Encounter: Payer: Self-pay | Admitting: Internal Medicine

## 2023-10-27 ENCOUNTER — Other Ambulatory Visit: Payer: Self-pay | Admitting: Internal Medicine

## 2023-10-27 DIAGNOSIS — E66812 Obesity, class 2: Secondary | ICD-10-CM

## 2023-10-29 ENCOUNTER — Ambulatory Visit: Payer: Self-pay

## 2023-10-29 NOTE — Telephone Encounter (Signed)
 FYI Only or Action Required?: FYI only for provider.  Patient was last seen in primary care on 09/01/2023 by Joshua Debby CROME, MD. Called Nurse Triage reporting Fatigue. Symptoms began several months ago. Interventions attempted: Rest, hydration, or home remedies. Symptoms are: gradually worsening.  Triage Disposition: See PCP When Office is Open (Within 3 Days)  Patient/caregiver understands and will follow disposition?: Yes     Copied from CRM [id number]. Topic: Clinical - Red Word Triage >> Oct 29, 2023  4:22 PM Holly Mcmillan wrote: Red Word that prompted transfer to Nurse Triage: Fatigued, feeling extremely tired. Wonders if has to do with thyroid  levels being off Reason for Disposition  [1] MILD weakness (i.e., does not interfere with ability to work, go to school, normal activities) AND [2] persists > 1 week  Answer Assessment - Initial Assessment Questions 1. DESCRIPTION: Describe how you are feeling.     Fatigue/tired, low energy Pt endorses tick bite - now with itchy rash x 2 weeks - tick site with no changes and seems like it got better Also endorses recent thyroid  medication changes by PCP - reports feels worse since rx change 2. SEVERITY: How bad is it?  Can you stand and walk?   - MILD (0-3): Feels weak or tired, but does not interfere with work, school or normal activities.   - MODERATE (4-7): Able to stand and walk; weakness interferes with work, school, or normal activities.   - SEVERE (8-10): Unable to stand or walk; unable to do usual activities.     Mild - I'm getting off work and I feel like just go to bed feel like falling asleep at work 3. ONSET: When did these symptoms begin? (e.g., hours, days, weeks, months)     1.5 months ago 4. CAUSE: What do you think is causing the weakness or fatigue? (e.g., not drinking enough fluids, medical problem, trouble sleeping)     Unknown, thinks may be related to thyroid  rx change or tick bite she had 2 weeks ago (now  removed, no bullseye rash) 5. NEW MEDICINES:  Have you started on any new medicines recently? (e.g., opioid pain medicines, benzodiazepines, muscle relaxants, antidepressants, antihistamines, neuroleptics, beta blockers)     UNITHROID  125 MCG tablet 6. OTHER SYMPTOMS: Do you have any other symptoms? (e.g., chest pain, fever, cough, SOB, vomiting, diarrhea, bleeding, other areas of pain)     Headache - reports nagging headache and is using OTC meds Reports I've had worse headaches before 7. PREGNANCY: Is there any chance you are pregnant? When was your last menstrual period?     N/a  Answer Assessment - Initial Assessment Questions 1. ATTACHED:  Is the tick still on the skin?  (e.g., yes, no, unsure)     no 2. ONSET - TICK STILL ATTACHED:  How long do you think the tick has been on your skin? (e.g., hours, days, unsure)  Note:  Is there a recent activity (camping, hiking) where the caller may have been exposed?     2 weeks 3. ONSET - TICK NOT STILL ATTACHED: If the tick has been removed, how long do you think the tick was attached before you removed it? (e.g., 5 hours, 2 days). When was this?     N/a 4. LOCATION: Where is the tick bite located? (e.g., arm, leg)     Inner part of thigh 5. TYPE of TICK: Is it a wood tick or a deer tick? (e.g., deer tick, wood tick; unsure)  Real small one 6. SIZE of TICK: How big is the tick? (e.g., size of poppy seed, apple seed, watermelon seed; unsure) Note: Deer ticks can be the size of a poppy seed (nymph) or an apple seed (adult).       small 7. ENGORGED: Did the tick look flat or engorged (full, swollen)? (e.g., flat, engorged; unsure)     unknown 8. OTHER SYMPTOMS: Do you have any other symptoms? (e.g., fever, rash, redness at bite area, red ring around bite)     Fatigue, itchy at site Denies bulls eye rash 9. PREGNANCY: Is there any chance you are pregnant? When was your last menstrual period?      N/a  Protocols used: Weakness (Generalized) and Fatigue-A-AH, Tick Bite-A-AH

## 2023-11-01 ENCOUNTER — Ambulatory Visit (INDEPENDENT_AMBULATORY_CARE_PROVIDER_SITE_OTHER): Admitting: Family Medicine

## 2023-11-01 ENCOUNTER — Encounter: Payer: Self-pay | Admitting: Family Medicine

## 2023-11-01 VITALS — BP 134/84 | HR 71 | Temp 97.7°F | Resp 16 | Ht 62.0 in | Wt 208.0 lb

## 2023-11-01 DIAGNOSIS — Z6838 Body mass index (BMI) 38.0-38.9, adult: Secondary | ICD-10-CM

## 2023-11-01 DIAGNOSIS — E039 Hypothyroidism, unspecified: Secondary | ICD-10-CM

## 2023-11-01 DIAGNOSIS — E66812 Obesity, class 2: Secondary | ICD-10-CM

## 2023-11-01 DIAGNOSIS — A6920 Lyme disease, unspecified: Secondary | ICD-10-CM

## 2023-11-01 DIAGNOSIS — G4733 Obstructive sleep apnea (adult) (pediatric): Secondary | ICD-10-CM | POA: Diagnosis not present

## 2023-11-01 LAB — TSH: TSH: 18.53 u[IU]/mL — ABNORMAL HIGH (ref 0.35–5.50)

## 2023-11-01 MED ORDER — DOXYCYCLINE HYCLATE 100 MG PO TABS: 100.0000 mg | ORAL_TABLET | Freq: Two times a day (BID) | ORAL | 0 refills | Status: AC

## 2023-11-01 MED ORDER — ZEPBOUND 2.5 MG/0.5ML ~~LOC~~ SOAJ: 2.5000 mg | SUBCUTANEOUS | 0 refills | Status: DC

## 2023-11-01 NOTE — Progress Notes (Signed)
 Assessment & Plan:  1. Lyme disease (Primary) - doxycycline  (VIBRA -TABS) 100 MG tablet; Take 1 tablet (100 mg total) by mouth 2 (two) times daily for 14 days.  Dispense: 28 tablet; Refill: 0  2. Acquired hypothyroidism Previously uncontrolled and had medication adjustments at that time. - TSH  3. OSA (obstructive sleep apnea) D/C Wegovy . Start Zepbound. Encouraged to increase exercise. Discussed [employer] as an app to help with calorie intake awareness.  - tirzepatide (ZEPBOUND) 2.5 MG/0.5ML Pen; Inject 2.5 mg into the skin once a week.  Dispense: 2 mL; Refill: 0  4. Class 2 severe obesity due to excess calories with serious comorbidity and body mass index (BMI) of 38.0 to 38.9 in adult [facility]) Start Zepbound. - tirzepatide (ZEPBOUND) 2.5 MG/0.5ML Pen; Inject 2.5 mg into the skin once a week.  Dispense: 2 mL; Refill: 0   Follow up plan: Return in about 4 weeks (around 11/29/2023) for chronic follow-up with PCP (patient is pastdue).  Niki Rung, MSN, APRN, FNP-C  Subjective:  HPI: Holly Mcmillan is a 53 y.o. female presenting on 11/01/2023 for Insect Bite (Tick bite 2 weeks ago on inner thigh. Tick was removed and then a few days later pt noticed itchy rash on back ), Headache (Constant headache x 5 days. Imitrex  is helping.), and Fatigue (Fatigue x 2 weeks since the tick bite.)  Patient reports she got a tick off the right thigh two weeks ago.  Denies increased body aches, fever, and vomiting. She does report small bumps (rash) on her lower back, headaches, nausea (while experiencing a headache), and fatigue.   She is also concerned about her weight and Wegovy . She is on the max dose of Wegovy  and feels like she started gaining weight back. She reports she was 222 lbs when she started Wegovy  approximately a year and a half ago. She was able to get down to 201 lbs in August of last year. She reports her weight has been increasing over the past few months. She is currently going  through a separation. She tries to stay active around the house and is walking her dog one mile 4x/week. The following is a typical day's diet for her: Breakfast: half bagel/cereal Lunch: shake Snack: apples Ice cream is a problem Dinner: grilled chicken and vegetables She has increased her water intake and does not feel she is overeating.     ROS: Negative unless specifically indicated above in HPI.   Relevant past medical history reviewed and updated as indicated.   Allergies and medications reviewed and updated.   Current Outpatient Medications:    buPROPion  (WELLBUTRIN  XL) 300 MG 24 hr tablet, TAKE 1 TABLET BY MOUTH EVERY DAY, Disp: 30 tablet, Rfl: 5   DULoxetine  (CYMBALTA ) 60 MG capsule, TAKE 1 CAPSULE BY MOUTH EVERY DAY, Disp: 30 capsule, Rfl: 3   estradiol (ESTRACE) 2 MG tablet, Take 2-4 mg by mouth daily., Disp: , Rfl:    Insulin  Pen Needle 32G X 6 MM MISC, 1 Act by Does not apply route daily., Disp: 100 each, Rfl: 1   ipratropium (ATROVENT ) 0.06 % nasal spray, PLACE 2 SPRAYS INTO BOTH NOSTRILS 3 (THREE) TIMES DAILY, Disp: 15 mL, Rfl: 1   Pitavastatin  Calcium  4 MG TABS, TAKE 1 TABLET BY MOUTH EVERY DAY, Disp: 30 tablet, Rfl: 3   Plecanatide  (TRULANCE ) 3 MG TABS, Take 1 tablet (3 mg total) by mouth daily., Disp: 90 tablet, Rfl: 1   potassium chloride  SA (KLOR-CON  M) 20 MEQ tablet, Take  1 tablet (20 mEq total) by mouth daily., Disp: 90 tablet, Rfl: 0   progesterone (PROMETRIUM) 100 MG capsule, Take 100 mg by mouth at bedtime., Disp: , Rfl:    Semaglutide -Weight Management (WEGOVY ) 2.4 MG/0.75ML SOAJ, INJECT 2.4 MG INTO THE SKIN ONCE A WEEK., Disp: 3 mL, Rfl: 0   SUMAtriptan  (IMITREX ) 50 MG tablet, TAKE 1 TAB EVERY 2 HOURS AS NEEDED FOR MIGRAINE. MAY REPEAT IN 2 HOURS IF HEADACHE PERSISTS ORRECURS, Disp: 9 tablet, Rfl: 2   UNITHROID  125 MCG tablet, Take 1 tablet (125 mcg total) by mouth daily before breakfast., Disp: 90 tablet, Rfl: 1   valACYclovir  (VALTREX ) 1000 MG tablet, TAKE 2  TABLETS BY MOUTH 2 TIMES DAILY FOR 2 DOSES., Disp: 4 tablet, Rfl: 2   fluticasone  (FLONASE ) 50 MCG/ACT nasal spray, Place 2 sprays into both nostrils daily. (Patient not taking: Reported on 11/01/2023), Disp: 16 g, Rfl: 6   icosapent  Ethyl (VASCEPA ) 1 g capsule, TAKE 2 CAPSULES BY MOUTH 2 TIMES DAILY. (Patient not taking: Reported on 11/01/2023), Disp: 120 capsule, Rfl: 5   lidocaine  (LIDODERM ) 5 %, SMARTSIG:Topical, Disp: , Rfl:    NEOMYCIN -POLYMYXIN-HYDROCORTISONE  (CORTISPORIN) 1 % SOLN OTIC solution, Apply 1-2 drops to toe BID after soaking, Disp: 10 mL, Rfl: 1   tiZANidine  (ZANAFLEX ) 4 MG tablet, Take 1 tablet (4 mg total) by mouth every 6 (six) hours as needed for muscle spasms., Disp: 30 tablet, Rfl: 6  Allergies  Allergen Reactions   Spironolactone     Hair loss   Sulfonamide Derivatives Hives    Objective:   BP 134/84   Pulse 71   Temp 97.7 F (36.5 C)   Resp 16   Ht 5' 2 (1.575 m)   Wt 208 lb (94.3 kg)   SpO2 98%   BMI 38.04 kg/m    Physical Exam Vitals reviewed.  Constitutional:      General: She is not in acute distress.    Appearance: Normal appearance. She is not ill-appearing, toxic-appearing or diaphoretic.  HENT:     Head: Normocephalic and atraumatic.   Eyes:     General: No scleral icterus.       Right eye: No discharge.        Left eye: No discharge.     Conjunctiva/sclera: Conjunctivae normal.    Cardiovascular:     Rate and Rhythm: Normal rate.  Pulmonary:     Effort: Pulmonary effort is normal. No respiratory distress.   Musculoskeletal:        General: Normal range of motion.     Cervical back: Normal range of motion.   Skin:    General: Skin is warm and dry.     Capillary Refill: Capillary refill takes less than 2 seconds.     Findings: No rash.     Comments: One small bump on lower back without erythema, warmth, or drainage.    Neurological:     General: No focal deficit present.     Mental Status: She is alert and oriented to person,  place, and time. Mental status is at baseline.   Psychiatric:        Mood and Affect: Mood normal.        Behavior: Behavior normal.        Thought Content: Thought content normal.        Judgment: Judgment normal.

## 2023-11-03 ENCOUNTER — Telehealth: Payer: Self-pay | Admitting: Internal Medicine

## 2023-11-03 ENCOUNTER — Other Ambulatory Visit: Payer: Self-pay | Admitting: Internal Medicine

## 2023-11-03 DIAGNOSIS — I2510 Atherosclerotic heart disease of native coronary artery without angina pectoris: Secondary | ICD-10-CM

## 2023-11-03 DIAGNOSIS — E785 Hyperlipidemia, unspecified: Secondary | ICD-10-CM

## 2023-11-03 NOTE — Telephone Encounter (Signed)
 Copied from CRM [id number]. Topic: Clinical - Lab/Test Results >> Nov 03, 2023 12:00 PM Zenovia PARAS wrote: Reason for CRM: Patient calling in to discuss recent lab work and needed PA for medication with someone. Would like a call back

## 2023-11-04 ENCOUNTER — Ambulatory Visit: Payer: Self-pay | Admitting: Family Medicine

## 2023-11-04 DIAGNOSIS — E039 Hypothyroidism, unspecified: Secondary | ICD-10-CM

## 2023-11-04 NOTE — Telephone Encounter (Signed)
 Unable to reach patient. LMTRC

## 2023-11-09 ENCOUNTER — Other Ambulatory Visit: Payer: Self-pay | Admitting: Internal Medicine

## 2023-11-11 DIAGNOSIS — Z0289 Encounter for other administrative examinations: Secondary | ICD-10-CM

## 2023-11-12 NOTE — Telephone Encounter (Signed)
 Patient seen her lab results in mychart. She gave a verbal understanding to me on the phone.

## 2023-11-15 ENCOUNTER — Encounter: Payer: Self-pay | Admitting: Internal Medicine

## 2023-11-15 ENCOUNTER — Encounter: Payer: Self-pay | Admitting: Nurse Practitioner

## 2023-11-15 ENCOUNTER — Ambulatory Visit (INDEPENDENT_AMBULATORY_CARE_PROVIDER_SITE_OTHER): Admitting: Nurse Practitioner

## 2023-11-15 VITALS — BP 132/83 | HR 75 | Temp 98.2°F | Ht 62.0 in | Wt 202.0 lb

## 2023-11-15 DIAGNOSIS — Z6836 Body mass index (BMI) 36.0-36.9, adult: Secondary | ICD-10-CM

## 2023-11-15 DIAGNOSIS — E039 Hypothyroidism, unspecified: Secondary | ICD-10-CM

## 2023-11-15 DIAGNOSIS — I10 Essential (primary) hypertension: Secondary | ICD-10-CM

## 2023-11-15 DIAGNOSIS — E66812 Obesity, class 2: Secondary | ICD-10-CM | POA: Diagnosis not present

## 2023-11-15 DIAGNOSIS — E785 Hyperlipidemia, unspecified: Secondary | ICD-10-CM | POA: Diagnosis not present

## 2023-11-15 NOTE — Progress Notes (Addendum)
 Office: [phone]  /  Fax: [phone]   Initial Visit  Holly Mcmillan was seen in clinic today to evaluate for obesity. She is interested in losing weight to improve overall health and reduce the risk of weight related complications. She presents today to review program treatment options, initial physical assessment, and evaluation.     She was referred by: PCP  When asked what else they would like to accomplish? She states: Adopt a healthier eating pattern and lifestyle, Improve energy levels and physical activity, Improve existing medical conditions, and Improve quality of life  When asked how has your weight affected you? She states: Contributed to orthopedic problems or mobility issues, Having fatigue, and Having poor endurance  Some associated conditions: HTN, migraines, OSAS, IBS, hypothyroidism, OA, depression, RLS, atherosclerosis, HLD   Contributing factors: family history of obesity, chronic skipping of meals, menopause, and sedentary job  Weight promoting medications identified: None  Current nutrition plan: None  Current level of physical activity: None  Current or previous pharmacotherapy: Wegovy  2.4mg  (prescribed by PCP), Phentermine , Saxenda   Response to medication: Lost weight and was able to maintain weight loss   Past medical history includes:   Past Medical History:  Diagnosis Date   Arthritis    Depression    Dysmenorrhea    Gallbladder disease    Headache(784.0)    Hyperlipidemia    Hypertension    Hypothyroidism    Obesity    Ovarian cyst      Objective:   BP 132/83   Pulse 75   Temp 98.2 F (36.8 C)   Ht 5' 2 (1.575 m)   Wt 202 lb (91.6 kg)   LMP  (LMP Unknown)   SpO2 100%   BMI 36.95 kg/m  She was weighed on the bioimpedance scale: Body mass index is 36.95 kg/m.  Peak Weight:247 lbs , Body Fat%:36.9%, Visceral Fat Rating:11, Weight trend over the last 12 months: Decreasing  General:  Alert, oriented and cooperative. Patient is  in no acute distress.  Respiratory: Normal respiratory effort, no problems with respiration noted   Gait: able to ambulate independently  Mental Status: Normal mood and affect. Normal behavior. Normal judgment and thought content.   DIAGNOSTIC DATA REVIEWED:  BMET    Component Value Date/Time   NA 140 09/01/2023 1024   K 4.2 09/01/2023 1024   CL 104 09/01/2023 1024   CO2 28 09/01/2023 1024   GLUCOSE 78 09/01/2023 1024   BUN 19 09/01/2023 1024   CREATININE 0.85 09/01/2023 1024   CREATININE 0.73 01/29/2022 1604   CALCIUM  8.9 09/01/2023 1024   GFRNONAA >60 03/30/2022 1319   GFRAA >60 03/28/2018 2132   Lab Results  Component Value Date   HGBA1C 5.5 03/16/2016   HGBA1C 5.6 09/30/2011   No results found for: INSULIN  CBC    Component Value Date/Time   WBC 10.3 09/01/2023 1024   RBC 4.78 09/01/2023 1024   HGB 15.0 09/01/2023 1024   HCT 44.6 09/01/2023 1024   PLT 338.0 09/01/2023 1024   MCV 93.3 09/01/2023 1024   MCH 31.5 03/30/2022 1319   MCHC 33.7 09/01/2023 1024   RDW 12.9 09/01/2023 1024   Iron/TIBC/Ferritin/ %Sat No results found for: IRON, TIBC, FERRITIN, IRONPCTSAT Lipid Panel     Component Value Date/Time   CHOL 221 (H) 01/14/2023 0923   TRIG 137.0 09/01/2023 1024   HDL 63.80 01/14/2023 0923   CHOLHDL 3 01/14/2023 0923   VLDL 38.0 01/14/2023 0923   LDLCALC 119 (H) 01/14/2023  9076   LDLDIRECT 194.0 04/15/2022 1356   Hepatic Function Panel     Component Value Date/Time   PROT 7.5 09/01/2023 1024   ALBUMIN 4.2 09/01/2023 1024   AST 20 09/01/2023 1024   ALT 19 09/01/2023 1024   ALKPHOS 71 09/01/2023 1024   BILITOT 0.4 09/01/2023 1024   BILIDIR 0.0 09/01/2023 1024      Component Value Date/Time   TSH 18.53 (H) 11/01/2023 1149     Assessment and Plan:   Essential hypertension Continue follow-up with PCP.  Continue medications as directed  Hyperlipidemia with target LDL less than 100 Continue follow-up with PCP.  Continue medications as  directed.  Acquired hypothyroidism Continue follow-up with PCP.  Continue medications as directed  Obesity, Class II, BMI 35-39.9        Obesity Treatment / Action Plan:  Patient will work on garnering support from family and friends to begin weight loss journey. Will work on eliminating or reducing the presence of highly palatable, calorie dense foods in the home. Will complete provided nutritional and psychosocial assessment questionnaire before the next appointment. Will be scheduled for indirect calorimetry to determine resting energy expenditure in a fasting state.  This will allow us  to create a reduced calorie, high-protein meal plan to promote loss of fat mass while preserving muscle mass. Counseled on the health benefits of losing 5%-15% of total body weight. Was counseled on nutritional approaches to weight loss and benefits of reducing processed foods and consuming plant-based foods and high quality protein as part of nutritional weight management. Was counseled on pharmacotherapy and role as an adjunct in weight management.   Obesity Education Performed Today:  She was weighed on the bioimpedance scale and results were discussed and documented in the synopsis.  We discussed obesity as a disease and the importance of a more detailed evaluation of all the factors contributing to the disease.  We discussed the importance of long term lifestyle changes which include nutrition, exercise and behavioral modifications as well as the importance of customizing this to her specific health and social needs.  We discussed the benefits of reaching a healthier weight to alleviate the symptoms of existing conditions and reduce the risks of the biomechanical, metabolic and psychological effects of obesity.  Holly Mcmillan appears to be in the action stage of change and states they are ready to start intensive lifestyle modifications and behavioral modifications.  30 minutes was spent today  on this visit including the above counseling, pre-visit chart review, and post-visit documentation.  Reviewed by clinician on day of visit: allergies, medications, problem list, medical history, surgical history, family history, social history, and previous encounter notes pertinent to obesity diagnosis.    Corean SAUNDERS Moncerrat Burnstein FNP-C

## 2023-11-15 NOTE — Addendum Note (Signed)
 Addended by: Virgin Zellers on: 11/15/2023 10:48 AM   Modules accepted: Level of Service

## 2023-11-21 ENCOUNTER — Other Ambulatory Visit: Payer: Self-pay | Admitting: Internal Medicine

## 2023-11-22 ENCOUNTER — Other Ambulatory Visit: Payer: Self-pay | Admitting: Internal Medicine

## 2023-11-22 DIAGNOSIS — J301 Allergic rhinitis due to pollen: Secondary | ICD-10-CM

## 2023-11-24 LAB — HM MAMMOGRAPHY: HM Mammogram: NORMAL (ref 0–4)

## 2023-12-01 ENCOUNTER — Ambulatory Visit: Admitting: Internal Medicine

## 2023-12-01 ENCOUNTER — Ambulatory Visit: Payer: Self-pay | Admitting: Internal Medicine

## 2023-12-01 ENCOUNTER — Encounter: Payer: Self-pay | Admitting: Internal Medicine

## 2023-12-01 VITALS — BP 136/88 | HR 74 | Temp 98.0°F | Resp 16 | Ht 62.0 in | Wt 207.6 lb

## 2023-12-01 DIAGNOSIS — K581 Irritable bowel syndrome with constipation: Secondary | ICD-10-CM

## 2023-12-01 DIAGNOSIS — E039 Hypothyroidism, unspecified: Secondary | ICD-10-CM

## 2023-12-01 DIAGNOSIS — E785 Hyperlipidemia, unspecified: Secondary | ICD-10-CM

## 2023-12-01 DIAGNOSIS — E781 Pure hyperglyceridemia: Secondary | ICD-10-CM

## 2023-12-01 DIAGNOSIS — K635 Polyp of colon: Secondary | ICD-10-CM

## 2023-12-01 LAB — LIPID PANEL
Cholesterol: 218 mg/dL — ABNORMAL HIGH (ref 0–200)
HDL: 68.5 mg/dL (ref >39.00)
LDL Cholesterol: 110 mg/dL — ABNORMAL HIGH (ref 0–99)
NonHDL: 149.79
Total CHOL/HDL Ratio: 3
Triglycerides: 198 mg/dL — ABNORMAL HIGH (ref 0.0–149.0)
VLDL: 39.6 mg/dL (ref 0.0–40.0)

## 2023-12-01 LAB — TSH: TSH: 7.87 u[IU]/mL — ABNORMAL HIGH (ref 0.35–5.50)

## 2023-12-01 MED ORDER — UNITHROID 150 MCG PO TABS: 150.0000 ug | ORAL_TABLET | Freq: Every day | ORAL | 1 refills | Status: DC

## 2023-12-01 NOTE — Progress Notes (Unsigned)
 Subjective:  Patient ID: Holly Mcmillan, female    DOB: Jun 16, 1970  Age: 53 y.o. MRN: [id number]  CC: Hypertension and Hypothyroidism   HPI Holly Mcmillan presents for f/up ---  Discussed the use of AI scribe software for clinical note transcription with the patient, who gave verbal consent to proceed.  History of Present Illness Holly Mcmillan is a 53 year old female with hypothyroidism who presents with fatigue and constipation.  She experiences significant fatigue and changes in her voice, which she attributes to her thyroid  being 'way off'. She also reports constipation and is currently taking Trulance  for it, although she notes that the pharmacy is often out of stock.  She had a headache this morning, which she describes as 'kind of normal' for her. No blurred vision, chest pain, shortness of breath, dizziness, lightheadedness, or swelling in her legs or feet.  She recently had a mammogram and received the shingles vaccine. She is unsure about her hepatitis B vaccination status and mentions that her insurance requires a consultation before covering certain medications.     My insurance plan no longer covers GLP1 medications unless I enroll in [place] and then get you to call [employer] or visit their website to schedule a review process.   The portal address is [email or web address] or the phone number [phone].    I ask that you make this a priority I have been out of the office and unable to join the [facility] portal until today. I only have one week of Wegovy  left. If possible I would like to change to zebound.    I have joined the wellness weight program you recommended. They said it is better to have my primary doctor involved in the approval process.    Let me know if you need anything further. Thank you for your assistance in getting this approved.  Outpatient Medications Prior to Visit  Medication Sig Dispense Refill   buPROPion  (WELLBUTRIN  XL) 300 MG 24 hr tablet TAKE 1 TABLET BY MOUTH  EVERY DAY 30 tablet 5   DULoxetine  (CYMBALTA ) 60 MG capsule TAKE 1 CAPSULE BY MOUTH EVERY DAY 30 capsule 3   estradiol (ESTRACE) 2 MG tablet Take 2-4 mg by mouth daily.     Insulin  Pen Needle 32G X 6 MM MISC 1 Act by Does not apply route daily. 100 each 1   ipratropium (ATROVENT ) 0.06 % nasal spray PLACE 2 SPRAYS INTO BOTH NOSTRILS 3 (THREE) TIMES DAILY 9 mL 1   Pitavastatin  Calcium  4 MG TABS TAKE 1 TABLET BY MOUTH EVERY DAY 90 tablet 2   Plecanatide  (TRULANCE ) 3 MG TABS Take 1 tablet (3 mg total) by mouth daily. 90 tablet 1   potassium chloride  SA (KLOR-CON  M) 20 MEQ tablet Take 1 tablet (20 mEq total) by mouth daily. 90 tablet 0   progesterone (PROMETRIUM) 100 MG capsule Take 100 mg by mouth at bedtime.     SUMAtriptan  (IMITREX ) 50 MG tablet TAKE 1 TAB EVERY 2 HOURS AS NEEDED FOR MIGRAINE. MAY REPEAT IN 2 HOURS IF HEADACHE PERSISTS ORRECURS 9 tablet 2   valACYclovir  (VALTREX ) 1000 MG tablet TAKE 2 TABLETS BY MOUTH 2 TIMES DAILY FOR 2 DOSES. 4 tablet 2   UNITHROID  125 MCG tablet Take 1 tablet (125 mcg total) by mouth daily before breakfast. 90 tablet 1   icosapent  Ethyl (VASCEPA ) 1 g capsule TAKE 2 CAPSULES BY MOUTH 2 TIMES DAILY. (Patient not taking: Reported on 12/01/2023) 120 capsule 5   fluticasone  (FLONASE ) 50  MCG/ACT nasal spray Place 2 sprays into both nostrils daily. (Patient not taking: Reported on 11/15/2023) 16 g 6   lidocaine  (LIDODERM ) 5 % SMARTSIG:Topical     NEOMYCIN -POLYMYXIN-HYDROCORTISONE  (CORTISPORIN) 1 % SOLN OTIC solution Apply 1-2 drops to toe BID after soaking 10 mL 1   tirzepatide  (ZEPBOUND ) 2.5 MG/0.5ML Pen Inject 2.5 mg into the skin once a week. 2 mL 0   tiZANidine  (ZANAFLEX ) 4 MG tablet Take 1 tablet (4 mg total) by mouth every 6 (six) hours as needed for muscle spasms. 30 tablet 6   No facility-administered medications prior to visit.    ROS Review of Systems  Constitutional:  Positive for fatigue and unexpected weight change. Negative for appetite change, chills  and diaphoresis.  Eyes:  Negative for visual disturbance.  Respiratory: Negative.  Negative for cough, chest tightness, shortness of breath and wheezing.   Cardiovascular:  Negative for chest pain, palpitations and leg swelling.  Gastrointestinal:  Positive for constipation. Negative for abdominal pain, blood in stool, nausea and vomiting.  Endocrine: Negative.   Genitourinary:  Negative for difficulty urinating.  Musculoskeletal: Negative.  Negative for arthralgias and myalgias.  Skin: Negative.  Negative for color change.  Neurological: Negative.  Negative for dizziness and weakness.  Hematological:  Negative for adenopathy. Does not bruise/bleed easily.    Objective:  BP 136/88 (BP Location: Left Arm, Patient Position: Sitting, Cuff Size: Large)   Pulse 74   Temp 98 F (36.7 C) (Oral)   Resp 16   Ht 5' 2 (1.575 m)   Wt 207 lb 9.6 oz (94.2 kg)   LMP  (LMP Unknown)   SpO2 98%   BMI 37.97 kg/m   BP Readings from Last 3 Encounters:  12/01/23 136/88  11/15/23 132/83  11/01/23 134/84    Wt Readings from Last 3 Encounters:  12/01/23 207 lb 9.6 oz (94.2 kg)  11/15/23 202 lb (91.6 kg)  11/01/23 208 lb (94.3 kg)    Physical Exam Vitals reviewed.  Constitutional:      Appearance: Normal appearance.  HENT:     Nose: Nose normal.     Mouth/Throat:     Mouth: Mucous membranes are moist.  Eyes:     General: No scleral icterus.    Conjunctiva/sclera: Conjunctivae normal.  Cardiovascular:     Rate and Rhythm: Normal rate and regular rhythm.     Heart sounds: No murmur heard.    No friction rub. No gallop.  Pulmonary:     Effort: Pulmonary effort is normal.     Breath sounds: No stridor. No wheezing, rhonchi or rales.  Abdominal:     General: Abdomen is protuberant. Bowel sounds are decreased. There is no distension.     Palpations: Abdomen is soft. There is no hepatomegaly, splenomegaly or mass.     Tenderness: There is no abdominal tenderness.  Musculoskeletal:         General: Normal range of motion.     Cervical back: No tenderness.     Right lower leg: No edema.     Left lower leg: No edema.  Lymphadenopathy:     Cervical: No cervical adenopathy.  Skin:    General: Skin is dry.  Neurological:     General: No focal deficit present.     Mental Status: She is alert.  Psychiatric:        Mood and Affect: Mood normal.        Behavior: Behavior normal.     Lab Results  Component Value  Date   WBC 10.3 09/01/2023   HGB 15.0 09/01/2023   HCT 44.6 09/01/2023   PLT 338.0 09/01/2023   GLUCOSE 78 09/01/2023   CHOL 218 (H) 12/01/2023   TRIG 198.0 (H) 12/01/2023   HDL 68.50 12/01/2023   LDLDIRECT 194.0 04/15/2022   LDLCALC 110 (H) 12/01/2023   ALT 19 09/01/2023   AST 20 09/01/2023   NA 140 09/01/2023   K 4.2 09/01/2023   CL 104 09/01/2023   CREATININE 0.85 09/01/2023   BUN 19 09/01/2023   CO2 28 09/01/2023   TSH 7.87 (H) 12/01/2023   HGBA1C 5.5 03/16/2016    MR Abdomen W Wo Contrast Result Date: 09/15/2023 CLINICAL DATA:  Generalized abdominal pain, renal cyst and pancreatic cyst EXAM: MRI ABDOMEN WITHOUT AND WITH CONTRAST TECHNIQUE: Multiplanar multisequence MR imaging of the abdomen was performed both before and after the administration of intravenous contrast. CONTRAST:  9 mL Vueway  gadolinium contrast IV COMPARISON:  CT abdomen pelvis, 01/29/2023 FINDINGS: Lower chest: No acute abnormality. Hepatobiliary: No focal liver abnormality is seen. Status post cholecystectomy. No biliary dilatation. Pancreas: Small fatty interdigitation in the distal pancreatic tail, corresponding to prior CT finding (series 5, image 42). No pancreatic mass or cyst. No pancreatic ductal dilatation or surrounding inflammatory changes. Spleen: Normal in size without significant abnormality. Adrenals/Urinary Tract: Adrenal glands are unremarkable. Tiny benign fluid signal renal cortical cysts, for which no further follow-up or characterization is required. Kidneys are  otherwise normal, without obvious renal calculi, solid lesion, or hydronephrosis. Stomach/Bowel: Stomach is within normal limits. No evidence of bowel wall thickening, distention, or inflammatory changes. Vascular/Lymphatic: No significant vascular findings are present. No enlarged abdominal lymph nodes. Other: No abdominal wall hernia or abnormality. No ascites. Musculoskeletal: No acute or significant osseous findings. IMPRESSION: 1. Small fatty interdigitation in the distal pancreatic tail, corresponding to prior CT finding, benign, requiring no further follow-up or characterization. 2. Status post cholecystectomy. 3. No acute findings in the abdomen. Electronically Signed   By: Marolyn JONETTA Jaksch M.D.   On: 09/15/2023 07:11    Assessment & Plan:  Acquired hypothyroidism- Her TSH is elevated. Will increase the T4 dose. -     TSH; Future -     Unithroid ; Take 1 tablet (150 mcg total) by mouth daily before breakfast.  Dispense: 90 tablet; Refill: 1  Polyp of colon, unspecified part of colon, unspecified type -     Ambulatory referral to Gastroenterology  Hyperlipidemia with target LDL less than 100- LDL goal achieved. Doing well on the statin   Hypertriglyceridemia, essential -     Lipid panel; Future     Follow-up: Return in about 4 months (around 04/02/2024).  Debby Molt, MD

## 2023-12-01 NOTE — Patient Instructions (Signed)

## 2023-12-05 ENCOUNTER — Other Ambulatory Visit: Payer: Self-pay | Admitting: Internal Medicine

## 2023-12-05 DIAGNOSIS — G43011 Migraine without aura, intractable, with status migrainosus: Secondary | ICD-10-CM

## 2023-12-06 ENCOUNTER — Other Ambulatory Visit: Payer: Self-pay | Admitting: Internal Medicine

## 2023-12-06 DIAGNOSIS — I10 Essential (primary) hypertension: Secondary | ICD-10-CM

## 2023-12-06 DIAGNOSIS — F325 Major depressive disorder, single episode, in full remission: Secondary | ICD-10-CM

## 2023-12-06 DIAGNOSIS — M150 Primary generalized (osteo)arthritis: Secondary | ICD-10-CM

## 2023-12-06 DIAGNOSIS — E876 Hypokalemia: Secondary | ICD-10-CM

## 2023-12-07 NOTE — Telephone Encounter (Signed)
 Last OV 12/01/23 Next OV not scheduled  Last refill 09/13/23 Trina #9/2

## 2023-12-08 NOTE — Telephone Encounter (Signed)
 Last OV 12/01/23 Next OV not scheduled  Last refill(s) Duloxetine  08/13/23 Qty #30/3  Klor-Con  M 09/13/23 Qty #90/0

## 2023-12-09 ENCOUNTER — Ambulatory Visit: Admitting: Bariatrics

## 2023-12-09 ENCOUNTER — Encounter: Payer: Self-pay | Admitting: Bariatrics

## 2023-12-09 VITALS — BP 116/84 | HR 65 | Temp 97.9°F | Ht 62.0 in | Wt 204.0 lb

## 2023-12-09 DIAGNOSIS — E785 Hyperlipidemia, unspecified: Secondary | ICD-10-CM | POA: Diagnosis not present

## 2023-12-09 DIAGNOSIS — E039 Hypothyroidism, unspecified: Secondary | ICD-10-CM

## 2023-12-09 DIAGNOSIS — R7309 Other abnormal glucose: Secondary | ICD-10-CM | POA: Diagnosis not present

## 2023-12-09 DIAGNOSIS — E669 Obesity, unspecified: Secondary | ICD-10-CM

## 2023-12-09 DIAGNOSIS — Z1331 Encounter for screening for depression: Secondary | ICD-10-CM

## 2023-12-09 DIAGNOSIS — R5383 Other fatigue: Secondary | ICD-10-CM

## 2023-12-09 DIAGNOSIS — Z0000 Encounter for general adult medical examination without abnormal findings: Secondary | ICD-10-CM

## 2023-12-09 DIAGNOSIS — R0602 Shortness of breath: Secondary | ICD-10-CM | POA: Diagnosis not present

## 2023-12-09 DIAGNOSIS — I10 Essential (primary) hypertension: Secondary | ICD-10-CM

## 2023-12-09 DIAGNOSIS — Z6837 Body mass index (BMI) 37.0-37.9, adult: Secondary | ICD-10-CM

## 2023-12-09 DIAGNOSIS — E559 Vitamin D deficiency, unspecified: Secondary | ICD-10-CM

## 2023-12-09 NOTE — Progress Notes (Signed)
 At a Glance:  Vitals Temp: 97.9 F (36.6 C) BP: 116/84 Pulse Rate: 65 SpO2: 99 %   Anthropometric Measurements Height: 5' 2 (1.575 m) Weight: 204 lb (92.5 kg) BMI (Calculated): 37.3 Starting Weight: 204lb Peak Weight: 247lb   Body Composition  Body Fat %: 37.5 % Fat Mass (lbs): 76.6 lbs Muscle Mass (lbs): 121 lbs Total Body Water (lbs): 79.2 lbs Visceral Fat Rating : 11   Other Clinical Data RMR: 1584 Fasting: yes Labs: yes Today's Visit #: 1 Starting Date: 12/09/23    EKG: Normal sinus rhythm, rate 72 with some minor abnormalities which may be related to body habitus.   Indirect Calorimeter:   Resting Metabolic Rate ( RMR):  RMR (actual): 1584 kcal RMR (calculated): 1734 kcal The calculated basal metabolic rate is 8265 kcal thus her basal metabolic rate is worse than expected.  Plan:   Indirect calorimeter completed, interpreted and reviewed with patient today and allowed to ask questions.  Discussed the implications for the chosen plan and exercise based on the RMR reading.  Will consider repeating the RMR in the future based on weight loss.    Chief Complaint:  Obesity   Subjective:  Holly Mcmillan (MR# [id number]) is a 53 y.o. female who presents for evaluation and treatment of obesity and related comorbidities.   Asuka is currently in the action stage of change and ready to dedicate time achieving and maintaining a healthier weight. Tationna is interested in becoming our patient and working on intensive lifestyle modifications including (but not limited to) diet and exercise for weight loss.  Reeya has been struggling with her weight. She has been unsuccessful in either losing weight, maintaining weight loss, or reaching her healthy weight goal.  Kasidee's habits were reviewed today and are as follows: Her family eats meals together, she thinks her family will eat healthier with her, she started gaining weight after the birth of her daughter, she has  significant food cravings issues, she snacks frequently in the evenings, and she skips meals frequently.   Current or previous pharmacotherapy:  Wegovy  2.4mg  (prescribed by PCP), Phentermine , Saxenda    Response to medication: Lost weight and was able to maintain weight loss  Other Fatigue Tywanna denies daytime somnolence and denies waking up still tired. Patient has a history of symptoms of morning headache. Danila generally gets 8 hours of sleep per night, and states that she has difficulty falling asleep. Snoring is present. Apneic episodes are not present. Epworth Sleepiness Score is 7.   Shortness of Breath Shequita notes increasing shortness of breath with exercising and seems to be worsening over time with weight gain. She notes getting out of breath sooner with activity than she used to. This has gotten worse recently. Carolie denies shortness of breath at rest or orthopnea.  Depression Screen Onya's Food and Mood (modified PHQ-9) score was 17. 15-19 moderate severe depression     12/01/2023   11:03 AM  Depression screen PHQ 2/9  Decreased Interest 2  Down, Depressed, Hopeless 2  PHQ - 2 Score 4  Altered sleeping 0  Tired, decreased energy 3  Change in appetite 2  Feeling bad or failure about yourself  0  Trouble concentrating 3  Moving slowly or fidgety/restless 0  Suicidal thoughts 0  PHQ-9 Score 12  Difficult doing work/chores Not difficult at all     Assessment and Plan:   Other Fatigue Masiah does feel that her weight is causing her energy to be lower than it should  be. Fatigue may be related to obesity, depression or many other causes. Labs will be ordered, and in the meanwhile, Emon will focus on self care including making healthy food choices, increasing physical activity and focusing on stress reduction.  Shortness of Breath Talayeh does feel that she gets out of breath more easily that she used to when she exercises. Rocsi's shortness of breath appears to be obesity  related and exercise induced. She has agreed to work on weight loss and gradually increase exercise to treat her exercise induced shortness of breath. Will continue to monitor closely.  Health Maintenance:   Obesity   Plan: Will do EKG, indirect calorimetry, and labs.     Vitamin D  Deficiency She is at risk for vitamin D  deficiency due to obesity.   Plan: Will check for vitamin D  deficiency.   Hypertension Hypertension well controlled.  Medication(s): none She states that she has not been on any medicine in quite some time and has been well-controlled. BP Readings from Last 3 Encounters:  12/09/23 116/84  12/01/23 136/88  11/15/23 132/83   Lab Results  Component Value Date   CREATININE 0.85 09/01/2023   CREATININE 0.80 01/14/2023   CREATININE 0.67 03/30/2022   Lab Results  Component Value Date   GFR 77.80 09/01/2023   GFR 84.04 01/14/2023   GFR 81.16 09/18/2021    Plan: Will begin the plan and exercise. No added salt. Will keep sodium content to 1,500 mg or less per day.    Hyperlipidemia LDL is not at goal. Medication(s): Pitavastatin  and Vascepa  Cardiovascular risk factors: dyslipidemia, obesity (BMI >= 30 kg/m2), and sedentary lifestyle  Lab Results  Component Value Date   CHOL 218 (H) 12/01/2023   HDL 68.50 12/01/2023   LDLCALC 110 (H) 12/01/2023   LDLDIRECT 194.0 04/15/2022   TRIG 198.0 (H) 12/01/2023   CHOLHDL 3 12/01/2023   Lab Results  Component Value Date   ALT 19 09/01/2023   AST 20 09/01/2023   ALKPHOS 71 09/01/2023   BILITOT 0.4 09/01/2023   The 10-year ASCVD risk score (Arnett DK, et al., 2019) is: 1.3%   Values used to calculate the score:     Age: 17 years     Clincally relevant sex: Female     Is Non-Hispanic African American: No     Diabetic: No     Tobacco smoker: No     Systolic Blood Pressure: 116 mmHg     Is BP treated: No     HDL Cholesterol: 68.5 mg/dL     Total Cholesterol: 218 mg/dL  Plan:  Continue statin and Vascepa    Will avoid all trans fats.  Will read labels Will minimize saturated fats except the following: low fat meats in moderation, diary, and limited dark chocolate.     Natasia had a positive depression screening. Depression is commonly associated with obesity and often results in emotional eating behaviors. We will monitor this closely and work on CBT to help improve the non-hunger eating patterns. Referral to Psychology may be required if no improvement is seen as she continues in our clinic.    Previous labs reviewed today. Date: 12/01/2023 Lipids and TSH  Labs done today CMP, Insulin , HgbA1c, Vit D, Vit B12, and CBC and TSH   Generalized Obesity: BMI (Calculated): 37.3  Hypothyroidism ( Acquired):  Stable.  Does not report symptoms associated with uncontrolled hypothyroidism.  She did have a recent dosage change of her levothyroxine  to her higher dose after her TSH of 7.87 on  12/01/2023 Medication(s): Levothyroxine  150 mcg daily. Lab Results  Component Value Date   TSH 7.87 (H) 12/01/2023    Plan: Continue levothyroxine  at current dose. Counseling: The correct way to take levothyroxine  is fasting, with water, separated by at least 30 minutes from breakfast, and separated by more than 4 hours from calcium , iron, multivitamins, acid reflux medications (PPIs).  She will follow-up with her PCP.    Calandria is currently in the action stage of change and her goal is to begin weight loss efforts. I recommend Viola begin the structured treatment plan as follows:  Elevated glucose:  She is at risk for insulin  resistance, prediabetes and diabetes secondary to obesity.  Plan: Will do an A1c and insulin  level.  She has agreed to Category 2 Plan  Exercise goals: All adults should avoid inactivity. Some activity is better than none, and adults who participate in any amount of physical activity, gain some health benefits.  Behavioral modification strategies:increasing lean protein intake,  decreasing simple carbohydrates, increasing vegetables, increase H2O intake, increase high fiber foods, decreasing eating out, no skipping meals, keeping healthy foods in the home, better snacking choices, avoiding temptations, and planning for success  She was informed of the importance of frequent follow-up visits to maximize her success with intensive lifestyle modifications for her multiple health conditions. She was informed we would discuss her lab results at her next visit unless there is a critical issue that needs to be addressed sooner. Yari agreed to keep her next visit at the agreed upon time to discuss these results.  Objective:  General: Cooperative, alert, well developed, in no acute distress. HEENT: Conjunctivae and lids unremarkable. Cardiovascular: Regular rhythm.  Lungs: Normal work of breathing. Neurologic: No focal deficits.   Lab Results  Component Value Date   CREATININE 0.85 09/01/2023   BUN 19 09/01/2023   NA 140 09/01/2023   K 4.2 09/01/2023   CL 104 09/01/2023   CO2 28 09/01/2023   Lab Results  Component Value Date   ALT 19 09/01/2023   AST 20 09/01/2023   ALKPHOS 71 09/01/2023   BILITOT 0.4 09/01/2023   Lab Results  Component Value Date   HGBA1C 5.5 03/16/2016   HGBA1C 5.5 05/22/2014   HGBA1C 5.5 01/15/2014   HGBA1C 5.1 08/28/2013   HGBA1C 5.6 09/30/2011   No results found for: INSULIN  Lab Results  Component Value Date   TSH 7.87 (H) 12/01/2023   Lab Results  Component Value Date   CHOL 218 (H) 12/01/2023   HDL 68.50 12/01/2023   LDLCALC 110 (H) 12/01/2023   LDLDIRECT 194.0 04/15/2022   TRIG 198.0 (H) 12/01/2023   CHOLHDL 3 12/01/2023   Lab Results  Component Value Date   WBC 10.3 09/01/2023   HGB 15.0 09/01/2023   HCT 44.6 09/01/2023   MCV 93.3 09/01/2023   PLT 338.0 09/01/2023   No results found for: IRON, TIBC, FERRITIN  Attestation Statements:  Applicable history such as the following:  allergies, medications,  problem list, medical history, surgical history, family history, social history, and previous encounter notes reviewed by clinician on day of visit:  Time spent on visit in care of the patient today including the items listed below was 40 minutes.    15 minutes were spent talking about the history, 25 minutes for face to face counseling implementing the plan, discussing the specifics of how to arrange meals, meal planning, water intake.   I spent face to face time discussing his/her plan, including breakfast, additional breakfast  options, lunch, and dinner options, grocery list, and snacks.  I reviewed her indirect calorimetry. I discussed the implications for the diet plan.    Discussed the bio-impedence test (fat %, muscle mass, and water weight) and allowed the patient to ask questions.   Discussed the following information sheets: Category 2, Grocery List, 100 Calorie Snacks, 200 Calorie Snacks, Microwave Meals, and Protein Shake Sheet. .   I reviewed the labs which were ordered from her visit on 12/01/2023,   I additionally spent time documenting, reviewing, and checking the codes before submitting.   This may have been prepared with the assistance of Engineer, civil (consulting).  Occasional wrong-word or sound-a-like substitutions may have occurred due to the inherent limitations of voice recognition software.    Clayborne Daring, DO

## 2023-12-10 LAB — COMPREHENSIVE METABOLIC PANEL WITH GFR
ALT: 23 IU/L (ref 0–32)
AST: 21 IU/L (ref 0–40)
Albumin: 4.5 g/dL (ref 3.8–4.9)
Alkaline Phosphatase: 78 IU/L (ref 44–121)
BUN/Creatinine Ratio: 18 (ref 9–23)
BUN: 14 mg/dL (ref 6–24)
Bilirubin Total: 0.4 mg/dL (ref 0.0–1.2)
CO2: 20 mmol/L (ref 20–29)
Calcium: 9.1 mg/dL (ref 8.7–10.2)
Chloride: 103 mmol/L (ref 96–106)
Creatinine, Ser: 0.76 mg/dL (ref 0.57–1.00)
Globulin, Total: 2.5 g/dL (ref 1.5–4.5)
Glucose: 88 mg/dL (ref 70–99)
Potassium: 4.1 mmol/L (ref 3.5–5.2)
Sodium: 140 mmol/L (ref 134–144)
Total Protein: 7 g/dL (ref 6.0–8.5)
eGFR: 93 mL/min/1.73 (ref >59)

## 2023-12-10 LAB — CBC WITH DIFFERENTIAL/PLATELET
Basophils Absolute: 0.1 x10E3/uL (ref 0.0–0.2)
Basos: 1 %
EOS (ABSOLUTE): 0 x10E3/uL (ref 0.0–0.4)
Eos: 0 %
Hematocrit: 44.1 % (ref 34.0–46.6)
Hemoglobin: 14.5 g/dL (ref 11.1–15.9)
Immature Grans (Abs): 0.1 x10E3/uL (ref 0.0–0.1)
Immature Granulocytes: 1 %
Lymphocytes Absolute: 2.7 x10E3/uL (ref 0.7–3.1)
Lymphs: 25 %
MCH: 31.6 pg (ref 26.6–33.0)
MCHC: 32.9 g/dL (ref 31.5–35.7)
MCV: 96 fL (ref 79–97)
Monocytes Absolute: 1.1 x10E3/uL — ABNORMAL HIGH (ref 0.1–0.9)
Monocytes: 10 %
Neutrophils Absolute: 6.9 x10E3/uL (ref 1.4–7.0)
Neutrophils: 63 %
Platelets: 353 x10E3/uL (ref 150–450)
RBC: 4.59 x10E6/uL (ref 3.77–5.28)
RDW: 12.9 % (ref 11.7–15.4)
WBC: 10.7 x10E3/uL (ref 3.4–10.8)

## 2023-12-10 LAB — HEMOGLOBIN A1C
Est. average glucose Bld gHb Est-mCnc: 105 mg/dL
Hgb A1c MFr Bld: 5.3 % (ref 4.8–5.6)

## 2023-12-10 LAB — VITAMIN D 25 HYDROXY (VIT D DEFICIENCY, FRACTURES): Vit D, 25-Hydroxy: 31.4 ng/mL (ref 30.0–100.0)

## 2023-12-10 LAB — VITAMIN B12: Vitamin B-12: 361 pg/mL (ref 232–1245)

## 2023-12-10 LAB — INSULIN, RANDOM: INSULIN: 9.7 u[IU]/mL (ref 2.6–24.9)

## 2023-12-10 LAB — TSH: TSH: 4.52 u[IU]/mL — ABNORMAL HIGH (ref 0.450–4.500)

## 2023-12-16 ENCOUNTER — Other Ambulatory Visit: Payer: Self-pay | Admitting: Internal Medicine

## 2023-12-16 ENCOUNTER — Other Ambulatory Visit: Payer: Self-pay

## 2023-12-16 ENCOUNTER — Emergency Department (HOSPITAL_BASED_OUTPATIENT_CLINIC_OR_DEPARTMENT_OTHER)

## 2023-12-16 ENCOUNTER — Emergency Department (HOSPITAL_BASED_OUTPATIENT_CLINIC_OR_DEPARTMENT_OTHER)
Admission: EM | Admit: 2023-12-16 | Discharge: 2023-12-16 | Disposition: A | Discharge status: Home / Self Care | Attending: Emergency Medicine | Admitting: Emergency Medicine

## 2023-12-16 ENCOUNTER — Emergency Department (HOSPITAL_BASED_OUTPATIENT_CLINIC_OR_DEPARTMENT_OTHER): Admitting: Radiology

## 2023-12-16 DIAGNOSIS — I10 Essential (primary) hypertension: Secondary | ICD-10-CM | POA: Insufficient documentation

## 2023-12-16 DIAGNOSIS — E039 Hypothyroidism, unspecified: Secondary | ICD-10-CM | POA: Insufficient documentation

## 2023-12-16 DIAGNOSIS — R0789 Other chest pain: Secondary | ICD-10-CM | POA: Insufficient documentation

## 2023-12-16 DIAGNOSIS — E781 Pure hyperglyceridemia: Secondary | ICD-10-CM

## 2023-12-16 DIAGNOSIS — Z794 Long term (current) use of insulin: Secondary | ICD-10-CM | POA: Insufficient documentation

## 2023-12-16 DIAGNOSIS — D72829 Elevated white blood cell count, unspecified: Secondary | ICD-10-CM | POA: Insufficient documentation

## 2023-12-16 DIAGNOSIS — I2510 Atherosclerotic heart disease of native coronary artery without angina pectoris: Secondary | ICD-10-CM

## 2023-12-16 LAB — BASIC METABOLIC PANEL WITH GFR
Anion gap: 13 (ref 5–15)
BUN: 19 mg/dL (ref 6–20)
CO2: 24 mmol/L (ref 22–32)
Calcium: 9.5 mg/dL (ref 8.9–10.3)
Chloride: 105 mmol/L (ref 98–111)
Creatinine, Ser: 0.84 mg/dL (ref 0.44–1.00)
GFR, Estimated: >60 mL/min (ref >60)
Glucose, Bld: 90 mg/dL (ref 70–99)
Potassium: 4 mmol/L (ref 3.5–5.1)
Sodium: 142 mmol/L (ref 135–145)

## 2023-12-16 LAB — HEPATIC FUNCTION PANEL
ALT: 21 U/L (ref 0–44)
AST: 21 U/L (ref 15–41)
Albumin: 4.3 g/dL (ref 3.5–5.0)
Alkaline Phosphatase: 82 U/L (ref 38–126)
Bilirubin, Direct: <0.1 mg/dL (ref 0.0–0.2)
Total Bilirubin: 0.3 mg/dL (ref 0.0–1.2)
Total Protein: 7.3 g/dL (ref 6.5–8.1)

## 2023-12-16 LAB — CBC
HCT: 40.9 % (ref 36.0–46.0)
Hemoglobin: 13.3 g/dL (ref 12.0–15.0)
MCH: 31.1 pg (ref 26.0–34.0)
MCHC: 32.5 g/dL (ref 30.0–36.0)
MCV: 95.8 fL (ref 80.0–100.0)
Platelets: 315 K/uL (ref 150–400)
RBC: 4.27 MIL/uL (ref 3.87–5.11)
RDW: 13.3 % (ref 11.5–15.5)
WBC: 12 K/uL — ABNORMAL HIGH (ref 4.0–10.5)
nRBC: 0 % (ref 0.0–0.2)

## 2023-12-16 LAB — D-DIMER, QUANTITATIVE: D-Dimer, Quant: 0.39 ug{FEU}/mL (ref 0.00–0.50)

## 2023-12-16 LAB — TROPONIN T, HIGH SENSITIVITY: Troponin T High Sensitivity: <15 ng/L (ref 0–19)

## 2023-12-16 MED ORDER — KETOROLAC TROMETHAMINE 15 MG/ML IJ SOLN
15.0000 mg | Freq: Once | INTRAMUSCULAR | Status: AC
  Administered 2023-12-16: 15 mg via INTRAVENOUS
  Filled 2023-12-16: qty 1

## 2023-12-16 NOTE — ED Triage Notes (Signed)
 Pt caox4 ambulatory c/o CP x3 days, worsened today, R side of chest and R shoulder blade, pt also c/o L leg pain/numbness (3days) and HTN.

## 2023-12-16 NOTE — ED Provider Notes (Signed)
 [place] EMERGENCY DEPARTMENT AT [facility] Provider Note   CSN: [id number] Arrival date & time: 12/16/23  1827     Patient presents with: Chest Pain   Holly Mcmillan is a 53 y.o. female.    Chest Pain Patient presents with right-sided chest pain and left lower leg pain.  Has had around 3 days of symptoms.  States that sharp on the right chest.  Near her breast.  Worse with breathing.  No fevers.  No coughing.  Not worse with eating.  No trauma.  Also pain in the left leg.  No trauma.  No injury.  No specific swelling.    Past Medical History:  Diagnosis Date   Arthritis    Back pain    Constipation    Depression    Dysmenorrhea    Gallbladder disease    Headache(784.0)    Hyperlipidemia    Hypertension    Hypothyroidism    Joint pain    Obesity    Ovarian cyst    Pre-diabetes    Swelling of lower extremity     Prior to Admission medications   Medication Sig Start Date End Date Taking? Authorizing Provider  buPROPion (WELLBUTRIN XL) 300 MG 24 hr tablet TAKE 1 TABLET BY MOUTH EVERY DAY 08/13/23   Joshua Debby CROME, MD  DULoxetine (CYMBALTA) 60 MG capsule TAKE 1 CAPSULE BY MOUTH EVERY DAY 12/08/23   Joshua Debby CROME, MD  estradiol (ESTRACE) 2 MG tablet Take 2-4 mg by mouth daily. 07/17/19   [provider]  icosapent Ethyl (VASCEPA) 1 g capsule TAKE 2 CAPSULES BY MOUTH 2 TIMES DAILY. 12/16/23   Joshua Debby CROME, MD  Insulin Pen Needle 32G X 6 MM MISC 1 Act by Does not apply route daily. 05/08/21   Joshua Debby CROME, MD  ipratropium (ATROVENT) 0.06 % nasal spray PLACE 2 SPRAYS INTO BOTH NOSTRILS 3 (THREE) TIMES DAILY 11/23/23   Joshua Debby CROME, MD  Pitavastatin Calcium 4 MG TABS TAKE 1 TABLET BY MOUTH EVERY DAY 11/03/23   Joshua Debby CROME, MD  Plecanatide (TRULANCE) 3 MG TABS Take 1 tablet (3 mg total) by mouth daily. 09/15/23   Joshua Debby CROME, MD  potassium chloride SA (KLOR-CON M) 20 MEQ tablet TAKE 1 TABLET BY MOUTH EVERY DAY 12/08/23   Joshua Debby CROME, MD   progesterone (PROMETRIUM) 100 MG capsule Take 100 mg by mouth at bedtime. 10/14/23   [provider]  SUMAtriptan (IMITREX) 50 MG tablet TAKE 1 TAB EVERY 2 HOURS AS NEEDED FOR MIGRAINE. MAY REPEAT IN 2 HOURS IF HEADACHE PERSISTS ORRECURS 12/07/23   Joshua Debby CROME, MD  UNITHROID 150 MCG tablet Take 1 tablet (150 mcg total) by mouth daily before breakfast. 12/01/23   Joshua Debby CROME, MD  valACYclovir (VALTREX) 1000 MG tablet TAKE 2 TABLETS BY MOUTH 2 TIMES DAILY FOR 2 DOSES. 12/21/21   Joshua Debby CROME, MD    Allergies: Spironolactone and Sulfonamide derivatives    Review of Systems  Cardiovascular:  Positive for chest pain.    Updated Vital Signs BP 137/81   Pulse 79   Temp 98.5 F (36.9 C) (Oral)   Resp (!) 21   Ht 5' 2 (1.575 m)   Wt 92.1 kg   SpO2 98%   BMI 37.13 kg/m   Physical Exam Vitals and nursing note reviewed.  HENT:     Head: Normocephalic.  Cardiovascular:     Rate and Rhythm: Regular rhythm.  Pulmonary:  Breath sounds: No wheezing.  Chest:     Chest wall: Tenderness present.     Comments: Tenderness right lateral chest wall.  No rash.  No crepitance or deformity.  Equal breath sounds. Abdominal:     Tenderness: There is no abdominal tenderness.  Musculoskeletal:     Right lower leg: No edema.     Left lower leg: No edema.  Skin:    General: Skin is warm.     Capillary Refill: Capillary refill takes less than 2 seconds.  Neurological:     Mental Status: She is alert.     (all labs ordered are listed, but only abnormal results are displayed) Labs Reviewed  CBC - Abnormal; Notable for the following components:      Result Value   WBC 12.0 (*)    All other components within normal limits  BASIC METABOLIC PANEL WITH GFR  D-DIMER, QUANTITATIVE  HEPATIC FUNCTION PANEL  TROPONIN T, HIGH SENSITIVITY    EKG: EKG Interpretation Date/Time:  Thursday December 16 2023 18:55:02 EDT Ventricular Rate:  78 PR Interval:  168 QRS Duration:  94 QT  Interval:  377 QTC Calculation: 430 R Axis:   88  Text Interpretation: Sinus rhythm Low voltage, precordial leads Confirmed by Patsey Lot [phone]) on 12/16/2023 7:11:28 PM  Radiology: US  Venous Img Lower Unilateral Left Result Date: 12/16/2023 CLINICAL DATA:  Left leg edema and numbness for 3 days, initial encounter EXAM: LEFT LOWER EXTREMITY VENOUS DOPPLER ULTRASOUND TECHNIQUE: Gray-scale sonography with graded compression, as well as color Doppler and duplex ultrasound were performed to evaluate the lower extremity deep venous systems from the level of the common femoral vein and including the common femoral, femoral, profunda femoral, popliteal and calf veins including the posterior tibial, peroneal and gastrocnemius veins when visible. The superficial great saphenous vein was also interrogated. Spectral Doppler was utilized to evaluate flow at rest and with distal augmentation maneuvers in the common femoral, femoral and popliteal veins. COMPARISON:  None Available. FINDINGS: Contralateral Common Femoral Vein: Respiratory phasicity is normal and symmetric with the symptomatic side. No evidence of thrombus. Normal compressibility. Common Femoral Vein: No evidence of thrombus. Normal compressibility, respiratory phasicity and response to augmentation. Saphenofemoral Junction: No evidence of thrombus. Normal compressibility and flow on color Doppler imaging. Profunda Femoral Vein: No evidence of thrombus. Normal compressibility and flow on color Doppler imaging. Femoral Vein: No evidence of thrombus. Normal compressibility, respiratory phasicity and response to augmentation. Popliteal Vein: No evidence of thrombus. Normal compressibility, respiratory phasicity and response to augmentation. Calf Veins: No evidence of thrombus. Normal compressibility and flow on color Doppler imaging. Superficial Great Saphenous Vein: No evidence of thrombus. Normal compressibility. Venous Reflux:  None. Other Findings:   None. IMPRESSION: No evidence of deep venous thrombosis. Electronically Signed   By: Oneil Devonshire M.D.   On: 12/16/2023 19:47   DG Chest 2 View Result Date: 12/16/2023 CLINICAL DATA:  Chest pain for several days, initial encounter EXAM: CHEST - 2 VIEW COMPARISON:  03/30/2022 FINDINGS: The heart size and mediastinal contours are within normal limits. Both lungs are clear. The visualized skeletal structures are unremarkable. IMPRESSION: No active cardiopulmonary disease. Electronically Signed   By: Oneil Devonshire M.D.   On: 12/16/2023 19:44     Procedures   Medications Ordered in the ED  ketorolac  (TORADOL ) 15 MG/ML injection 15 mg (15 mg Intravenous Given 12/16/23 2017)  Medical Decision Making Amount and/or Complexity of Data Reviewed Labs: ordered. Radiology: ordered.  Risk Prescription drug management.   Patient with right sided chest pain that is pleuritic.  Differential diagnose includes musculoskeletal pain but also causes such as pneumonia and PE.  Also with leg pain will get Doppler to rule out DVT.  Patient workup reassuring.  Negative Doppler negative negative D-dimer.  X-ray reassuring.  Blood pressure improved.  Doubt cardiac ischemia doubt PE.  Appears stable for discharge home with outpatient follow-up.     Final diagnoses:  Chest wall pain    ED Discharge Orders     None          Patsey Lot, MD 12/16/23 2342

## 2023-12-20 ENCOUNTER — Encounter: Payer: Self-pay | Admitting: Internal Medicine

## 2023-12-20 NOTE — Telephone Encounter (Signed)
 Patient has been scheduled

## 2023-12-21 ENCOUNTER — Ambulatory Visit (INDEPENDENT_AMBULATORY_CARE_PROVIDER_SITE_OTHER): Admitting: Family Medicine

## 2023-12-21 ENCOUNTER — Encounter: Payer: Self-pay | Admitting: Family Medicine

## 2023-12-21 VITALS — BP 136/88 | HR 79 | Temp 98.2°F | Ht 62.0 in | Wt 212.6 lb

## 2023-12-21 DIAGNOSIS — R0789 Other chest pain: Secondary | ICD-10-CM | POA: Diagnosis not present

## 2023-12-21 DIAGNOSIS — M25511 Pain in right shoulder: Secondary | ICD-10-CM

## 2023-12-21 DIAGNOSIS — M5442 Lumbago with sciatica, left side: Secondary | ICD-10-CM

## 2023-12-21 DIAGNOSIS — T148XXA Other injury of unspecified body region, initial encounter: Secondary | ICD-10-CM

## 2023-12-21 MED ORDER — METHOCARBAMOL 500 MG PO TABS
500.0000 mg | ORAL_TABLET | Freq: Three times a day (TID) | ORAL | 0 refills | Status: AC | PRN
Start: 2023-12-21 — End: ?

## 2023-12-21 MED ORDER — PREDNISONE 10 MG (21) PO TBPK: ORAL_TABLET | Freq: Every day | ORAL | 0 refills | Status: AC

## 2023-12-21 NOTE — Progress Notes (Signed)
 Acute Office Visit  Subjective:     Patient ID: Holly Mcmillan, female    DOB: 10/15/1970, 53 y.o.   MRN: [id number]  Chief Complaint  Patient presents with   Shoulder Pain    Pt stated that she has been experiencing shoulder/leg pain along with some back pain for the past week. It has gotten worse over the week   Leg Pain    HPI  Discussed the use of AI scribe software for clinical note transcription with the patient, who gave verbal consent to proceed.  History of Present Illness Holly Mcmillan is a 53 year old female who presents with severe left leg pain and right shoulder discomfort.  Lower extremity pain and swelling - Severe leg pain, worsened by sitting and improved by standing - Associated swelling of the leg - Pain radiates down the leg - Described as a numb, dulling ache  Shoulder and upper back discomfort - Discomfort localized under the shoulder blade - Pain sometimes radiates to the chest - Sharp and persistent pain - Worsens with sitting and improves with standing     ROS Per HPI      Objective:    BP 136/88   Pulse 79   Temp 98.2 F (36.8 C)   Ht 5' 2 (1.575 m)   Wt 212 lb 9.6 oz (96.4 kg)   SpO2 98%   BMI 38.89 kg/m    Physical Exam Vitals and nursing note reviewed.  Constitutional:      General: She is not in acute distress.    Appearance: Normal appearance. She is normal weight.  HENT:     Head: Normocephalic and atraumatic.     Right Ear: External ear normal.     Left Ear: External ear normal.     Nose: Nose normal.  Eyes:     Extraocular Movements: Extraocular movements intact.  Cardiovascular:     Rate and Rhythm: Normal rate and regular rhythm.     Pulses: Normal pulses.  Pulmonary:     Effort: Pulmonary effort is normal.  Musculoskeletal:        General: Normal range of motion.     Cervical back: Normal range of motion.       Back:     Right lower leg: No edema.     Left lower leg: No edema.     Comments: Areas of  swelling and tenderness, muscles in spasm. No erythema, no bruising, no obvious deformity. + SLR on the left. LROM to R shoulder r/t swelling  Lymphadenopathy:     Cervical: No cervical adenopathy.  Neurological:     General: No focal deficit present.     Mental Status: She is alert and oriented to person, place, and time.  Psychiatric:        Mood and Affect: Mood normal.        Thought Content: Thought content normal.     No results found for any visits on 12/21/23.      Assessment & Plan:   Assessment and Plan Assessment & Plan Lower extremity pain and swelling Acute pain and swelling in the lower extremity, likely due to inflammation. - Prescribed oral steroids with a tapering dose over five days. - Advised use of heat, ice, and hot tub for relief. - Provided stretches for symptom alleviation. - Consider physical therapy if no improvement in a few days.  Shoulder pain Acute sharp pain under the shoulder blade, possibly stress or inflammation-related. - Prescribed muscle  relaxers twice daily, first dose at night. - Advised use of heat, muscle rubs, and stretches for relief. - Provided printed stretch instructions.  Back pain Back pain exacerbated by sitting, likely due to muscle tension or inflammation. - Advised use of heat, ice, and hot tub for relief. - Provided stretches for symptom alleviation. - Consider physical therapy if no improvement in a few days.     No orders of the defined types were placed in this encounter.    Meds ordered this encounter  Medications   predniSONE  (STERAPRED UNI-PAK 21 TAB) 10 MG (21) TBPK tablet    Sig: Take by mouth daily for 6 days. Take 6 tablets on day 1, 5 tablets on day 2, 4 tablets on day 3, 3 tablets on day 4, 2 tablets on day 5, 1 tablet on day 6    Dispense:  21 tablet    Refill:  0   methocarbamol  (ROBAXIN ) 500 MG tablet    Sig: Take 1 tablet (500 mg total) by mouth every 8 (eight) hours as needed for muscle spasms.     Dispense:  30 tablet    Refill:  0    Return if symptoms worsen or fail to improve.  Corean LITTIE Ku, FNP

## 2023-12-21 NOTE — Patient Instructions (Addendum)
 Take by mouth daily for 6 days. Take 6 tablets on day 1, 5 tablets on day 2, 4 tablets on day 3, 3 tablets on day 4, 2 tablets on day 5, 1 tablet on day 6  I have sent in a muscle relaxer for you to use one tablet as needed for muscle spasms. This medication can make you sleepy. Do not drive until you know how this medication affects you.   May use heat or ice to the area for relief as needed.  May use topical rubs or gels to the area as needed for relief.  Attached subtle stretching exercises to help relieve pain and strengthen muscles.   Follow-up with me for new or worsening symptoms.

## 2023-12-23 ENCOUNTER — Encounter: Payer: Self-pay | Admitting: Bariatrics

## 2023-12-23 ENCOUNTER — Ambulatory Visit: Admitting: Bariatrics

## 2023-12-23 VITALS — BP 123/78 | HR 75 | Temp 98.6°F | Ht 62.0 in | Wt 201.0 lb

## 2023-12-23 DIAGNOSIS — R7989 Other specified abnormal findings of blood chemistry: Secondary | ICD-10-CM

## 2023-12-23 DIAGNOSIS — Z6836 Body mass index (BMI) 36.0-36.9, adult: Secondary | ICD-10-CM

## 2023-12-23 DIAGNOSIS — E039 Hypothyroidism, unspecified: Secondary | ICD-10-CM

## 2023-12-23 DIAGNOSIS — E669 Obesity, unspecified: Secondary | ICD-10-CM | POA: Diagnosis not present

## 2023-12-23 DIAGNOSIS — E66812 Obesity, class 2: Secondary | ICD-10-CM

## 2023-12-23 DIAGNOSIS — E559 Vitamin D deficiency, unspecified: Secondary | ICD-10-CM | POA: Diagnosis not present

## 2023-12-23 MED ORDER — VITAMIN D (ERGOCALCIFEROL) 1.25 MG (50000 UNIT) PO CAPS
50000.0000 [IU] | ORAL_CAPSULE | ORAL | 0 refills | Status: DC
Start: 2023-12-23 — End: 2024-01-26

## 2023-12-23 NOTE — Progress Notes (Signed)
 WEIGHT SUMMARY AND BIOMETRICS  Weight Lost Since Last Visit: 3lb  Weight Gained Since Last Visit: 0lb   Vitals Temp: 98.6 F (37 C) BP: 123/78 Pulse Rate: 75 SpO2: 99 %   Anthropometric Measurements Height: 5' 2 (1.575 m) Weight: 201 lb (91.2 kg) BMI (Calculated): 36.75 Weight at Last Visit: 204lb Weight Lost Since Last Visit: 3lb Weight Gained Since Last Visit: 0lb Starting Weight: 204lb Total Weight Loss (lbs): 3 lb (1.361 kg)   Body Composition  Body Fat %: 37.2 % Fat Mass (lbs): 75 lbs Muscle Mass (lbs): 120.2 lbs Total Body Water (lbs): 77.8 lbs Visceral Fat Rating : 11   Other Clinical Data Fasting: no Labs: no Today's Visit #: 2 Starting Date: 12/09/23    OBESITY Holly Mcmillan is here to discuss her progress with her obesity treatment plan along with follow-up of her obesity related diagnoses.    Nutrition Plan: the Category 2 plan - 30% adherence.  Current exercise: walking and yard work  Interim History:  She is down 3 lbs since her first visit. She has been on vacation and caused some inflammation in her shoulders and is on prednisone .  Eating all of the food on the plan., Protein intake is as prescribed, Is not skipping meals, and Water intake is inadequate.  Hunger is moderately controlled.  Cravings are moderately controlled.  Assessment/Plan:   Vitamin D  Insuffiencey:   Vitamin D  is not at goal of 50.  Most recent vitamin D  level was 31.4. She is not on vitamin D   Lab Results  Component Value Date   VD25OH 31.4 12/09/2023    Plan: Begin prescription vitamin D  50,000 IU weekly.   Hypothyroidism Stable.  Does not report symptoms associated with uncontrolled hypothyroidism. Medication(s): Unithyroid 150 mcg daily. Lab Results  Component Value Date   TSH 4.520 (H) 12/09/2023    Plan: Continue Unithyroid  at current  dose. Counseling: The correct way to take her thyroid  medication , is fasting, with water, separated by at least 30 minutes from breakfast, and separated by more than 4 hours from calcium , iron, multivitamins, acid reflux medications (PPIs).   Labs reviewed today (CMP,  CBC, HgbA1c, insulin , vitamin D , B 12, and TSH).    Generalized Obesity: Current BMI BMI (Calculated): 36.75   Pharmacotherapy Plan She does not want to start any anti-obesity medications but may want to in the future. She had been on the Wegovy  in the past without side effects.   Holly Mcmillan is currently in the action stage of change. As such, her goal is to continue with weight loss efforts.  She has agreed to the Category 2 plan. She was given the Smart Fruit sheet and discussed.   Exercise goals: All adults should avoid inactivity. Some physical activity is better than none, and adults who participate in any amount of physical activity gain some health benefits. She  will start going to the gym.   Behavioral modification strategies: increasing lean protein intake, decreasing simple carbohydrates , no meal skipping, meal planning , planning for success, decrease snacking , keep healthy foods in the home, and mindful eating.  Holly Mcmillan has agreed to follow-up with our clinic in 2 weeks.   Objective:   VITALS: Per patient if applicable, see vitals. GENERAL: Alert and in no acute distress. CARDIOPULMONARY: No increased WOB. Speaking in clear sentences.  PSYCH: Pleasant and cooperative. Speech normal rate and rhythm. Affect is appropriate. Insight and judgement are appropriate. Attention is focused, linear, and appropriate.  NEURO: Oriented as arrived to appointment on time with no prompting.   Attestation Statements:   This was prepared with the assistance of Engineer, civil (consulting).  Occasional wrong-word or sound-a-like substitutions may have occurred due to the inherent limitations of voice recognition   Clayborne Daring,  DO

## 2024-01-06 ENCOUNTER — Encounter: Payer: Self-pay | Admitting: Internal Medicine

## 2024-01-07 ENCOUNTER — Other Ambulatory Visit: Payer: Self-pay | Admitting: Family

## 2024-01-07 DIAGNOSIS — E039 Hypothyroidism, unspecified: Secondary | ICD-10-CM

## 2024-01-07 MED ORDER — UNITHROID 150 MCG PO TABS: 150.0000 ug | ORAL_TABLET | Freq: Every day | ORAL | 1 refills | Status: DC

## 2024-01-10 ENCOUNTER — Other Ambulatory Visit: Payer: Self-pay | Admitting: Internal Medicine

## 2024-01-10 ENCOUNTER — Other Ambulatory Visit: Payer: Self-pay | Admitting: Family

## 2024-01-10 MED ORDER — LEVOTHYROXINE SODIUM 150 MCG PO TABS: 150.0000 ug | ORAL_TABLET | Freq: Every day | ORAL | 3 refills | Status: AC

## 2024-01-14 ENCOUNTER — Encounter: Payer: Self-pay | Admitting: Internal Medicine

## 2024-01-15 ENCOUNTER — Other Ambulatory Visit: Payer: Self-pay | Admitting: Bariatrics

## 2024-01-16 ENCOUNTER — Other Ambulatory Visit: Payer: Self-pay | Admitting: Internal Medicine

## 2024-01-16 DIAGNOSIS — J301 Allergic rhinitis due to pollen: Secondary | ICD-10-CM

## 2024-01-26 ENCOUNTER — Ambulatory Visit (INDEPENDENT_AMBULATORY_CARE_PROVIDER_SITE_OTHER): Admitting: Bariatrics

## 2024-01-26 ENCOUNTER — Encounter: Payer: Self-pay | Admitting: Bariatrics

## 2024-01-26 ENCOUNTER — Telehealth (INDEPENDENT_AMBULATORY_CARE_PROVIDER_SITE_OTHER): Payer: Self-pay | Admitting: *Deleted

## 2024-01-26 VITALS — BP 121/83 | HR 78 | Temp 97.5°F | Ht 62.0 in | Wt 201.0 lb

## 2024-01-26 DIAGNOSIS — I10 Essential (primary) hypertension: Secondary | ICD-10-CM | POA: Diagnosis not present

## 2024-01-26 DIAGNOSIS — R632 Polyphagia: Secondary | ICD-10-CM

## 2024-01-26 DIAGNOSIS — E66812 Obesity, class 2: Secondary | ICD-10-CM

## 2024-01-26 DIAGNOSIS — E669 Obesity, unspecified: Secondary | ICD-10-CM

## 2024-01-26 DIAGNOSIS — Z6836 Body mass index (BMI) 36.0-36.9, adult: Secondary | ICD-10-CM

## 2024-01-26 DIAGNOSIS — E559 Vitamin D deficiency, unspecified: Secondary | ICD-10-CM

## 2024-01-26 MED ORDER — ZEPBOUND 2.5 MG/0.5ML ~~LOC~~ SOAJ
2.5000 mg | SUBCUTANEOUS | 0 refills | Status: DC
Start: 2024-01-26 — End: 2024-02-24

## 2024-01-26 MED ORDER — VITAMIN D (ERGOCALCIFEROL) 1.25 MG (50000 UNIT) PO CAPS: 50000.0000 [IU] | ORAL_CAPSULE | ORAL | 0 refills | Status: DC

## 2024-01-26 NOTE — Telephone Encounter (Signed)
 Holly Mcmillan (Key: [id number])  [employer] is processing your inquiry and will respond shortly with next steps. You are currently using the fastest method to process this request. Please do not fax or call [employer] to resubmit this request. To check for an update later, open this request again from your dashboard.  If you need assistance, please chat with CoverMyMeds or call us  at [phone]

## 2024-01-26 NOTE — Progress Notes (Signed)
 WEIGHT SUMMARY AND BIOMETRICS  Weight Lost Since Last Visit: 0  Weight Gained Since Last Visit: 0   Vitals Temp: (!) 97.5 F (36.4 C) BP: 121/83 Pulse Rate: 78 SpO2: 100 %   Anthropometric Measurements Height: 5' 2 (1.575 m) Weight: 201 lb (91.2 kg) BMI (Calculated): 36.75 Weight at Last Visit: 201lb Weight Lost Since Last Visit: 0 Weight Gained Since Last Visit: 0 Starting Weight: 204lb Total Weight Loss (lbs): 3 lb (1.361 kg)   Body Composition  Body Fat %: 38.4 % Fat Mass (lbs): 77.4 lbs Muscle Mass (lbs): 118 lbs Total Body Water (lbs): 80.4 lbs Visceral Fat Rating : 11   Other Clinical Data Fasting: no Labs: no Today's Visit #: 3 Starting Date: 12/09/23    OBESITY Holly Mcmillan is here to discuss her progress with her obesity treatment plan along with follow-up of her obesity related diagnoses.    Nutrition Plan: the Category 2 plan - 70-80% adherence.  Current exercise: walking  Interim History:  Her weight remains the same.  Eating all of the food on the plan., Protein intake is as prescribed, and Water intake is adequate.   Pharmacotherapy: Holly Mcmillan is not on any  Hunger is moderately controlled.  Cravings are moderately controlled.  Assessment/Plan:   Vitamin D  Insuffiencey:  Vitamin D  is not at goal of 50.  Most recent vitamin D  level was 31.4. Holly Mcmillan is on  prescription ergocalciferol  50,000 IU weekly. Lab Results  Component Value Date   VD25OH 31.4 12/09/2023    Plan: Refill prescription vitamin D  50,000 IU weekly.   Hypertension Hypertension well controlled.  Medication(s): none  BP Readings from Last 3 Encounters:  01/26/24 121/83  12/23/23 123/78  12/21/23 136/88   Lab Results  Component Value Date   CREATININE 0.84 12/16/2023   CREATININE 0.76 12/09/2023   CREATININE 0.85 09/01/2023   Lab Results  Component Value  Date   GFR 77.80 09/01/2023   GFR 84.04 01/14/2023   GFR 81.16 09/18/2021    Plan: Will continue the plan and exercise.  No added salt. Will keep sodium content to 1,500 mg or less per day.    Polyphagia Holly Mcmillan endorses excessive hunger.  Medication(s): none Appetite:  moderately controlled. Cravings are moderately controlled.   Plan: Medication(s): Zepbound  2.5 mg SQ weekly Will increase water, protein and fiber to help assuage hunger.  Will minimize foods that have a high glucose index/load to minimize reactive hypoglycemia.   Holly Mcmillan denies personal or family history of thyroid  cancer, history of pancreatitis, or current cholelithiasis. Holly Mcmillan was informed of the most common side effects (nausea, constipation, diarrhea). Holly Mcmillan was given GLP-1 information sheet.  Patient informed to watch for possible symptoms, such as a lump or swelling in the neck, hoarseness, trouble swallowing, or shortness of breath. If you have any of these symptoms, tell your healthcare provide.   Holly Mcmillan has been  placed on a 500 calorie deficit diet.  Holly Mcmillan has been advised to exercise at least 150 minutes per week, both cardio and resistance.    Patient states that Holly Mcmillan has been on phentermine  in the past but had rapid heart rate.  Holly Mcmillan also states that Holly Mcmillan has been in a program x 2 where Holly Mcmillan took hCG.  Holly Mcmillan additionally was taking Wegovy  up until July of this year and states that Holly Mcmillan had been on the medication for approximately a year and a half and had lost weight. Holly Mcmillan is currently in a program called [facility] through her insurance.  Holly Mcmillan states that they will pay for a GLP-1 if Holly Mcmillan qualifies and has a consult with one of the providers prescribing the medication.  Holly Mcmillan states that Holly Mcmillan has been in the program for some time and that Holly Mcmillan is following their plan.  Information: Insurance Diplomatic Services operational officer) [employer], [phone].     Generalized Obesity: Current BMI BMI (Calculated): 36.75   Pharmacotherapy Plan Start  Zepbound   2.5 mg SQ weekly  Holly Mcmillan is currently in the action stage of change. As such, her goal is to continue with weight loss efforts.  Holly Mcmillan has agreed to the Category 2 plan.  Exercise goals: All adults should avoid inactivity. Some physical activity is better than none, and adults who participate in any amount of physical activity gain some health benefits. Holly Mcmillan will continue to walk. Holly Mcmillan recently got an injection in her left knee.   Behavioral modification strategies: increasing lean protein intake, no meal skipping, meal planning , increase water intake, better snacking choices, increasing vegetables, avoiding temptations, keep healthy foods in the home, weigh protein portions, and measure portion sizes.  Holly Mcmillan has agreed to follow-up with our clinic in 4 weeks.     Objective:   VITALS: Per patient if applicable, see vitals. GENERAL: Alert and in no acute distress. CARDIOPULMONARY: No increased WOB. Speaking in clear sentences.  PSYCH: Pleasant and cooperative. Speech normal rate and rhythm. Affect is appropriate. Insight and judgement are appropriate. Attention is focused, linear, and appropriate.  NEURO: Oriented as arrived to appointment on time with no prompting.   Attestation Statements:   This was prepared with the assistance of Engineer, civil (consulting).  Occasional wrong-word or sound-a-like substitutions may have occurred due to the inherent limitations of voice recognition   Clayborne Daring, DO

## 2024-02-03 ENCOUNTER — Encounter: Payer: Self-pay | Admitting: Internal Medicine

## 2024-02-03 ENCOUNTER — Ambulatory Visit (AMBULATORY_SURGERY_CENTER)

## 2024-02-03 VITALS — Ht 62.0 in | Wt 203.0 lb

## 2024-02-03 DIAGNOSIS — Z860100 Personal history of colon polyps, unspecified: Secondary | ICD-10-CM

## 2024-02-03 MED ORDER — NA SULFATE-K SULFATE-MG SULF 17.5-3.13-1.6 GM/177ML PO SOLN: 1.0000 | Freq: Once | ORAL | 0 refills | Status: AC

## 2024-02-03 NOTE — Progress Notes (Signed)
 Pre visit completed via video call; Patient verified name, DOB, and address; No egg or soy allergy known to patient;  No issues known to pt with past sedation with any surgeries or procedures; Patient denies ever being told they had issues or difficulty with intubation;  No FH of Malignant Hyperthermia; Pt is not on diet pills; Pt is not on home 02;  Pt is not on blood thinners;  Pt denies issues with constipation as long as she takes her Trulance  as prescribed;  No A fib or A flutter; Have any cardiac testing pending--NO Insurance verified during PV appt--- [employer] Pt can ambulate without assistance;  Pt denies use of chewing tobacco; Discussed diabetic/weight loss medication holds; Discussed NSAID holds; Checked BMI to be less than 50; Pt instructed to use [employer] or GoodRx for a price reduction on prep;  Patient's chart reviewed by Norleen Schillings CNRA prior to previsit and patient appropriate for the [facility];  Pre visit completed and red dot placed by patient's name on their procedure day (on provider's schedule);  Instructions sent to MyChart per patient request;

## 2024-02-06 ENCOUNTER — Other Ambulatory Visit: Payer: Self-pay | Admitting: Internal Medicine

## 2024-02-06 DIAGNOSIS — F325 Major depressive disorder, single episode, in full remission: Secondary | ICD-10-CM

## 2024-02-08 NOTE — Telephone Encounter (Signed)
 Message from Plan CaseId:[id number];Status:Approved;Review Type:Prior Auth;Coverage Start Date:12/28/2023;Coverage End Date:09/23/2024;. Authorization Expiration Date: Sep 23, 2024.  Patient notified via Mychart message.

## 2024-02-09 ENCOUNTER — Encounter: Payer: Self-pay | Admitting: Bariatrics

## 2024-02-09 ENCOUNTER — Ambulatory Visit: Admitting: Bariatrics

## 2024-02-09 VITALS — BP 135/79 | HR 71 | Temp 97.6°F | Ht 62.0 in | Wt 201.0 lb

## 2024-02-09 DIAGNOSIS — E559 Vitamin D deficiency, unspecified: Secondary | ICD-10-CM | POA: Diagnosis not present

## 2024-02-09 DIAGNOSIS — E669 Obesity, unspecified: Secondary | ICD-10-CM

## 2024-02-09 DIAGNOSIS — Z6836 Body mass index (BMI) 36.0-36.9, adult: Secondary | ICD-10-CM

## 2024-02-09 DIAGNOSIS — R632 Polyphagia: Secondary | ICD-10-CM | POA: Diagnosis not present

## 2024-02-09 MED ORDER — VITAMIN D (ERGOCALCIFEROL) 1.25 MG (50000 UNIT) PO CAPS: 50000.0000 [IU] | ORAL_CAPSULE | ORAL | 0 refills | Status: DC

## 2024-02-09 NOTE — Progress Notes (Signed)
 WEIGHT SUMMARY AND BIOMETRICS  Weight Lost Since Last Visit: 0  Weight Gained Since Last Visit: 0   Vitals Temp: 97.6 F (36.4 C) BP: 135/79 Pulse Rate: 71 SpO2: 98 %   Anthropometric Measurements Height: 5' 2 (1.575 m) Weight: 201 lb (91.2 kg) BMI (Calculated): 36.75 Weight at Last Visit: 201lb Weight Lost Since Last Visit: 0 Weight Gained Since Last Visit: 0 Starting Weight: 204lb Total Weight Loss (lbs): 3 lb (1.361 kg)   Body Composition  Body Fat %: 44 % Fat Mass (lbs): 88.6 lbs Muscle Mass (lbs): 107 lbs Total Body Water (lbs): 80.4 lbs Visceral Fat Rating : 12   Other Clinical Data Fasting: no Labs: no Today's Visit #: 4 Starting Date: 12/09/23    OBESITY Holly Mcmillan is here to discuss her progress with her obesity treatment plan along with follow-up of her obesity related diagnoses.    Nutrition Plan: the Category 2 plan - 80-85% adherence.  Current exercise: none  Interim History:  Her weight remains the same.  Eating all of the food on the plan., Protein intake is as prescribed, Is not skipping meals, and Water intake is adequate.   Pharmacotherapy: Holly Mcmillan is on Zepbound  2.5 mg SQ weekly Adverse side effects: None Hunger is moderately controlled.  Cravings are moderately controlled.  Assessment/Plan:   Vitamin D  Deficiency Vitamin D  is not at goal of 50.  Most recent vitamin D  level was 31.4. She is on  prescription ergocalciferol  50,000 IU weekly. Lab Results  Component Value Date   VD25OH 31.4 12/09/2023    Plan: Refill prescription vitamin D  50,000 IU weekly.   Polyphagia Holly Mcmillan endorses excessive hunger.  Medication(s): Zepbound   Effects of medication:  moderately controlled. Cravings are moderately controlled.   Plan: Medication(s): Zepbound  2.5 mg SQ weekly (may increase at her next visit).  Will increase water,  protein and fiber to help assuage hunger.  Will minimize foods that have a high glucose index/load to minimize reactive hypoglycemia.  Will begin exercise at [employer] after PT completion.  Will do only exercises that do not exacerbate pain in her back or knees.      Generalized Obesity: Current BMI BMI (Calculated): 36.75   Pharmacotherapy Plan Continue  Zepbound  2.5 mg SQ weekly  Holly Mcmillan is currently in the action stage of change. As such, her goal is to continue with weight loss efforts.  She has agreed to the Category 2 plan.  Exercise goals: All adults should avoid inactivity. Some physical activity is better than none, and adults who participate in any amount of physical activity gain some health benefits.  Behavioral modification strategies: increasing lean protein intake, decreasing simple carbohydrates , no meal skipping, meal planning , increase water intake, planning for success, increasing vegetables, decrease snacking , weigh protein portions, and measure portion sizes.  Holly Mcmillan has agreed to follow-up with our clinic in 4  weeks.       Objective:   VITALS: Per patient if applicable, see vitals. GENERAL: Alert and in no acute distress. CARDIOPULMONARY: No increased WOB. Speaking in clear sentences.  PSYCH: Pleasant and cooperative. Speech normal rate and rhythm. Affect is appropriate. Insight and judgement are appropriate. Attention is focused, linear, and appropriate.  NEURO: Oriented as arrived to appointment on time with no prompting.   Attestation Statements:   This was prepared with the assistance of Engineer, civil (consulting).  Occasional wrong-word or sound-a-like substitutions may have occurred due to the inherent limitations of voice recognition   Clayborne Daring, DO

## 2024-02-15 ENCOUNTER — Encounter: Payer: Self-pay | Admitting: Internal Medicine

## 2024-02-15 ENCOUNTER — Ambulatory Visit: Admitting: Internal Medicine

## 2024-02-15 VITALS — BP 108/60 | HR 68 | Temp 97.5°F | Resp 12 | Ht 62.0 in | Wt 203.0 lb

## 2024-02-15 DIAGNOSIS — K648 Other hemorrhoids: Secondary | ICD-10-CM

## 2024-02-15 DIAGNOSIS — Z860100 Personal history of colon polyps, unspecified: Secondary | ICD-10-CM

## 2024-02-15 DIAGNOSIS — K5730 Diverticulosis of large intestine without perforation or abscess without bleeding: Secondary | ICD-10-CM

## 2024-02-15 DIAGNOSIS — Z1211 Encounter for screening for malignant neoplasm of colon: Secondary | ICD-10-CM | POA: Diagnosis present

## 2024-02-15 DIAGNOSIS — D123 Benign neoplasm of transverse colon: Secondary | ICD-10-CM | POA: Diagnosis not present

## 2024-02-15 DIAGNOSIS — Z860101 Personal history of adenomatous and serrated colon polyps: Secondary | ICD-10-CM

## 2024-02-15 MED ORDER — SODIUM CHLORIDE 0.9 % IV SOLN: 500.0000 mL | Freq: Once | INTRAVENOUS | Status: AC

## 2024-02-15 NOTE — Progress Notes (Signed)
 Called to room to assist during endoscopic procedure.  Patient ID and intended procedure confirmed with present staff. Received instructions for my participation in the procedure from the performing physician.

## 2024-02-15 NOTE — Progress Notes (Signed)
 Report to PACU, RN, vss, BBS= Clear.

## 2024-02-15 NOTE — Progress Notes (Signed)
 HISTORY OF PRESENT ILLNESS:  Holly Mcmillan is a 53 y.o. female with a personal history of sessile serrated and adenomatous colon polyps.  Now for surveillance colonoscopy.  REVIEW OF SYSTEMS:  All non-GI ROS negative except for  Past Medical History:  Diagnosis Date   Arthritis    Back pain    Constipation    Depression    Dysmenorrhea    Gallbladder disease    Headache(784.0)    Hyperlipidemia    Hypertension    Hypothyroidism    Joint pain    Obesity    Ovarian cyst    Pre-diabetes    Swelling of lower extremity     Past Surgical History:  Procedure Laterality Date   BREAST CYST EXCISION Right 06/24/2016   Procedure: EXCISION RIGHT BREAST CYST;  Surgeon: Vicenta Poli, MD;  Location: [facility];  Service: General;  Laterality: Right;   CESAREAN SECTION  2002, 2009   CHOLECYSTECTOMY  2005   COLONOSCOPY  07/2020   JP-MAC-moviprep (good)-TA   CYST EXCISION Left 2023   toe   ENDOMETRIAL ABLATION     HIP SURGERY     NASAL SINUS SURGERY     rotator cuff surgery Right    THYROID  SURGERY  2012   thyroidectomy    Social History Holly Mcmillan  reports that she has quit smoking. Her smoking use included cigarettes. She has a 6 pack-year smoking history. She has never used smokeless tobacco. She reports that she does not drink alcohol and does not use drugs.  family history includes Alcohol abuse in her maternal uncle and paternal uncle; Arthritis in her father; Cancer in her mother and another family member; Cervical cancer in her mother; Colon cancer in her cousin; Diabetes in her father and mother; Eating disorder in her mother; Heart disease in her maternal grandfather and mother; Hyperlipidemia in her father; Hypertension in her father and mother; Kidney disease in her father; Leukemia in her mother; Lung cancer in her maternal uncle; Obesity in her father and mother; Pancreatic cancer in her maternal aunt; Stroke in her father.  Allergies  Allergen  Reactions   Sulfonamide Derivatives Hives   Spironolactone Other (See Comments)    Hair loss       PHYSICAL EXAMINATION: Vital signs: BP (!) 153/76   Pulse 67   Temp (!) 97.5 F (36.4 C) (Temporal)   Resp (!) 24   Ht 5' 2 (1.575 m)   Wt 203 lb (92.1 kg)   SpO2 99%   BMI 37.13 kg/m  General: Well-developed, well-nourished, no acute distress HEENT: Sclerae are anicteric, conjunctiva pink. Oral mucosa intact Lungs: Clear Heart: Regular Abdomen: soft, nontender, nondistended, no obvious ascites, no peritoneal signs, normal bowel sounds. No organomegaly. Extremities: No edema Psychiatric: alert and oriented x3. Cooperative     ASSESSMENT:  Personal history of sessile serrated and adenomatous polyps   PLAN:   Surveillance colonoscopy

## 2024-02-15 NOTE — Patient Instructions (Addendum)
 YOU HAD AN ENDOSCOPIC PROCEDURE TODAY AT THE [facility]:   Refer to the procedure report that was given to you for any specific questions about what was found during the examination.  If the procedure report does not answer your questions, please call your gastroenterologist to clarify.  If you requested that your care partner not be given the details of your procedure findings, then the procedure report has been included in a sealed envelope for you to review at your convenience later.  YOU SHOULD EXPECT: Some feelings of bloating in the abdomen. Passage of more gas than usual.  Walking can help get rid of the air that was put into your GI tract during the procedure and reduce the bloating. If you had a lower endoscopy (such as a colonoscopy or flexible sigmoidoscopy) you may notice spotting of blood in your stool or on the toilet paper. If you underwent a bowel prep for your procedure, you may not have a normal bowel movement for a few days.  Please Note:  You might notice some irritation and congestion in your nose or some drainage.  This is from the oxygen used during your procedure.  There is no need for concern and it should clear up in a day or so.  SYMPTOMS TO REPORT IMMEDIATELY:  Following lower endoscopy (colonoscopy or flexible sigmoidoscopy):  Excessive amounts of blood in the stool  Significant tenderness or worsening of abdominal pains  Swelling of the abdomen that is new, acute  Fever of 100F or higher   For urgent or emergent issues, a gastroenterologist can be reached at any hour by calling [phone]. Do not use MyChart messaging for urgent concerns.    DIET:  We do recommend a small meal at first, but then you may proceed to your regular diet.  Drink plenty of fluids but you should avoid alcoholic beverages for 24 hours.  MEDICATIONS: Continue present medications.  FOLLOW UP: Await pathology results. Repeat colonoscopy in 5 years for  surveillance.  Educational handouts given to patient: Polyps, Diverticulosis, Hemorrhoids, Hemorrhoid banding.  Thank you for allowing us  to provide for your healthcare needs today.  ACTIVITY:  You should plan to take it easy for the rest of today and you should NOT DRIVE or use heavy machinery until tomorrow (because of the sedation medicines used during the test).    FOLLOW UP: Our staff will call the number listed on your records the next business day following your procedure.  We will call around 7:15- 8:00 am to check on you and address any questions or concerns that you may have regarding the information given to you following your procedure. If we do not reach you, we will leave a message.     If any biopsies were taken you will be contacted by phone or by letter within the next 1-3 weeks.  Please call us  at [phone] if you have not heard about the biopsies in 3 weeks.    SIGNATURES/CONFIDENTIALITY: You and/or your care partner have signed paperwork which will be entered into your electronic medical record.  These signatures attest to the fact that that the information above on your After Visit Summary has been reviewed and is understood.  Full responsibility of the confidentiality of this discharge information lies with you and/or your care-partner.

## 2024-02-15 NOTE — Op Note (Signed)
 [facility] Patient Name: Holly Mcmillan Procedure Date: 02/15/2024 7:23 AM MRN: [id number] Endoscopist: Norleen SAILOR. Abran , MD, [id number] Age: 53 Referring MD:  Date of Birth: [birth year] Gender: Female Account #: [id number] Procedure:                Colonoscopy with cold snare polypectomy x 3 Indications:              High risk colon cancer surveillance: Personal                            history of multiple (3 or more) adenomas, High risk                            colon cancer surveillance: Personal history of                            sessile serrated colon polyp (less than 10 mm in                            size) with no dysplasia. Previous colonoscopies                            2014, 2022 Medicines:                Monitored Anesthesia Care Procedure:                Pre-Anesthesia Assessment:                           - Prior to the procedure, a History and Physical                            was performed, and patient medications and                            allergies were reviewed. The patient's tolerance of                            previous anesthesia was also reviewed. The risks                            and benefits of the procedure and the sedation                            options and risks were discussed with the patient.                            All questions were answered, and informed consent                            was obtained. Prior Anticoagulants: The patient has                            taken no anticoagulant or antiplatelet agents. ASA  Grade Assessment: II - A patient with mild systemic                            disease. After reviewing the risks and benefits,                            the patient was deemed in satisfactory condition to                            undergo the procedure.                           After obtaining informed consent, the colonoscope                            was passed under direct  vision. Throughout the                            procedure, the patient's blood pressure, pulse, and                            oxygen saturations were monitored continuously. The                            Olympus Scope SN: [id number] was introduced through                            the anus and advanced to the the cecum, identified                            by appendiceal orifice and ileocecal valve. The                            ileocecal valve, appendiceal orifice, and rectum                            were photographed. The quality of the bowel                            preparation was excellent. The colonoscopy was                            performed without difficulty. The patient tolerated                            the procedure well. The bowel preparation used was                            SUPREP via split dose instruction. Scope In: 8:28:48 AM Scope Out: 8:45:51 AM Scope Withdrawal Time: 0 hours 13 minutes 7 seconds  Total Procedure Duration: 0 hours 17 minutes 3 seconds  Findings:                 Three polyps were found in the transverse colon.  The polyps were 2 to 4 mm in size. These polyps                            were removed with a cold snare. Resection and                            retrieval were complete.                           Diverticula were found in the entire colon.                           Internal hemorrhoids were found during retroflexion.                           The exam was otherwise without abnormality on                            direct and retroflexion views. Complications:            No immediate complications. Estimated blood loss:                            None. Estimated Blood Loss:     Estimated blood loss: none. Impression:               - Three 2 to 4 mm polyps in the transverse colon,                            removed with a cold snare. Resected and retrieved.                           - Diverticulosis in  the entire examined colon.                           - Internal hemorrhoids.                           - The examination was otherwise normal on direct                            and retroflexion views. Recommendation:           - Repeat colonoscopy in 5 years for surveillance.                           - Patient has a contact number available for                            emergencies. The signs and symptoms of potential                            delayed complications were discussed with the                            patient. Return to  normal activities tomorrow.                            Written discharge instructions were provided to the                            patient.                           - Resume previous diet.                           - Continue present medications.                           - Await pathology results. Norleen SAILOR. Abran, MD 02/15/2024 8:57:02 AM This report has been signed electronically.

## 2024-02-16 ENCOUNTER — Telehealth: Payer: Self-pay

## 2024-02-16 NOTE — Telephone Encounter (Signed)
  Follow up Call-     02/15/2024    7:19 AM  Call back number  Post procedure Call Back phone  # [phone]  Permission to leave phone message Yes     Patient questions:  Do you have a fever, pain , or abdominal swelling? No. Pain Score  0 *  Have you tolerated food without any problems? Yes.    Have you been able to return to your normal activities? Yes.    Do you have any questions about your discharge instructions: Diet   No. Medications  No. Follow up visit  No.  Do you have questions or concerns about your Care? No.  Actions: * If pain score is 4 or above: No action needed, pain <4.

## 2024-02-17 ENCOUNTER — Ambulatory Visit: Payer: Self-pay | Admitting: Internal Medicine

## 2024-02-17 LAB — SURGICAL PATHOLOGY

## 2024-02-24 ENCOUNTER — Ambulatory Visit (INDEPENDENT_AMBULATORY_CARE_PROVIDER_SITE_OTHER): Admitting: Bariatrics

## 2024-02-24 ENCOUNTER — Encounter: Payer: Self-pay | Admitting: Bariatrics

## 2024-02-24 VITALS — BP 138/86 | HR 72 | Temp 97.6°F | Ht 62.0 in | Wt 198.0 lb

## 2024-02-24 DIAGNOSIS — E669 Obesity, unspecified: Secondary | ICD-10-CM | POA: Diagnosis not present

## 2024-02-24 DIAGNOSIS — E559 Vitamin D deficiency, unspecified: Secondary | ICD-10-CM

## 2024-02-24 DIAGNOSIS — R632 Polyphagia: Secondary | ICD-10-CM

## 2024-02-24 DIAGNOSIS — Z6836 Body mass index (BMI) 36.0-36.9, adult: Secondary | ICD-10-CM

## 2024-02-24 DIAGNOSIS — E66812 Obesity, class 2: Secondary | ICD-10-CM

## 2024-02-24 MED ORDER — VITAMIN D (ERGOCALCIFEROL) 1.25 MG (50000 UNIT) PO CAPS: 50000.0000 [IU] | ORAL_CAPSULE | ORAL | 0 refills | Status: DC

## 2024-02-24 MED ORDER — TIRZEPATIDE-WEIGHT MANAGEMENT 5 MG/0.5ML ~~LOC~~ SOAJ: 5.0000 mg | SUBCUTANEOUS | 0 refills | Status: DC

## 2024-02-24 NOTE — Progress Notes (Signed)
 WEIGHT SUMMARY AND BIOMETRICS  Weight Lost Since Last Visit: 3lb  Weight Gained Since Last Visit: 0   Vitals Temp: 97.6 F (36.4 C) BP: 138/86 Pulse Rate: 72 SpO2: 100 %   Anthropometric Measurements Height: 5' 2 (1.575 m) Weight: 198 lb (89.8 kg) BMI (Calculated): 36.21 Weight at Last Visit: 201lb Weight Lost Since Last Visit: 3lb Weight Gained Since Last Visit: 0 Starting Weight: 204lb Total Weight Loss (lbs): 6 lb (2.722 kg)   Body Composition  Body Fat %: 42.7 % Fat Mass (lbs): 84.8 lbs Muscle Mass (lbs): 108 lbs Total Body Water (lbs): 78.2 lbs Visceral Fat Rating : 12   Other Clinical Data Fasting: no Labs: no Today's Visit #: 5 Starting Date: 12/09/23    OBESITY Everlee is here to discuss her progress with her obesity treatment plan along with follow-up of her obesity related diagnoses.    Nutrition Plan: the Category 2 plan - 90% adherence.  Current exercise: none  Interim History:  She is down another 3 lbs since her last visit. She is going to PT for her knee. She is still going to the gym.  Eating all of the food on the plan., Protein intake is as prescribed, and Water intake is adequate.   Pharmacotherapy: Blu is on Zepbound  2.5 mg SQ weekly Adverse side effects: None Hunger is moderately controlled.  Cravings are moderately controlled.  Assessment/Plan:   Melenie Minniear endorses excessive hunger. She states that she does not detect any change in her appetite.  Medication(s): Zepbound   Effects of medication:  moderately controlled. Cravings are moderately controlled.   Plan: Medication(s): Increase Zepbound  5.0 mg SQ weekly Will increase water, protein and fiber to help assuage hunger.  Will minimize foods that have a high glucose index/load to minimize reactive hypoglycemia.  Will increase her exercise both cardio and  resistance.   Vitamin D  Deficiency Vitamin D  is not at goal of 50.  Most recent vitamin D  level was 31.4. She is on  prescription ergocalciferol  50,000 IU weekly. Lab Results  Component Value Date   VD25OH 31.4 12/09/2023    Plan: Refill prescription vitamin D  50,000 IU weekly.    Generalized Obesity: Current BMI BMI (Calculated): 36.21   Pharmacotherapy Plan Continue and increase dose  Zepbound  5.0 mg SQ weekly  Ruth is currently in the action stage of change. As such, her goal is to continue with weight loss efforts.  She has agreed to the Category 2 plan.  Exercise goals: All adults should avoid inactivity. Some physical activity is better than none, and adults who participate in any amount of physical activity gain some health benefits.  Behavioral modification strategies: increasing vegetables, increasing lower sugar fruits, decrease snacking , decrease ETOH, keep healthy foods in the home, increase frequency of journaling, weigh protein portions, and mindful eating.  Larken has agreed to follow-up with our clinic  in 4 weeks.       Objective:   VITALS: Per patient if applicable, see vitals. GENERAL: Alert and in no acute distress. CARDIOPULMONARY: No increased WOB. Speaking in clear sentences.  PSYCH: Pleasant and cooperative. Speech normal rate and rhythm. Affect is appropriate. Insight and judgement are appropriate. Attention is focused, linear, and appropriate.  NEURO: Oriented as arrived to appointment on time with no prompting.   Attestation Statements:   This was prepared with the assistance of Engineer, civil (consulting).  Occasional wrong-word or sound-a-like substitutions may have occurred due to the inherent limitations of voice recognition   Clayborne Daring, DO

## 2024-03-05 ENCOUNTER — Other Ambulatory Visit: Payer: Self-pay | Admitting: Internal Medicine

## 2024-03-05 DIAGNOSIS — G43011 Migraine without aura, intractable, with status migrainosus: Secondary | ICD-10-CM

## 2024-03-17 ENCOUNTER — Telehealth: Payer: Self-pay

## 2024-03-17 ENCOUNTER — Other Ambulatory Visit (HOSPITAL_COMMUNITY): Payer: Self-pay

## 2024-03-17 NOTE — Telephone Encounter (Signed)
 Pharmacy Patient Advocate Encounter  Received notification from [employer] that Prior Authorization for Icosapent  Ethyl 1gm caps has been APPROVED from 02/16/24 to 03/17/25   PA #/Case ID/Reference #: [id number]

## 2024-03-17 NOTE — Telephone Encounter (Signed)
 Pharmacy Patient Advocate Encounter   Received notification from [employer] that prior authorization for Icosapent  Ethyl 1gm caps is required/requested.   Insurance verification completed.   The patient is insured through [employer].   Per test claim: PA required; PA submitted to above mentioned insurance via Latent Key/confirmation #/[facility]OC [facility] Status is pending

## 2024-03-20 NOTE — Telephone Encounter (Signed)
 Patient has been made aware and gave a verbal understanding.

## 2024-03-21 ENCOUNTER — Encounter: Payer: Self-pay | Admitting: Bariatrics

## 2024-03-23 ENCOUNTER — Other Ambulatory Visit (HOSPITAL_COMMUNITY): Payer: Self-pay

## 2024-03-29 ENCOUNTER — Encounter: Payer: Self-pay | Admitting: Bariatrics

## 2024-03-29 ENCOUNTER — Ambulatory Visit (INDEPENDENT_AMBULATORY_CARE_PROVIDER_SITE_OTHER): Admitting: Bariatrics

## 2024-03-29 VITALS — BP 142/86 | HR 73 | Temp 98.0°F | Ht 62.0 in | Wt 195.0 lb

## 2024-03-29 DIAGNOSIS — E669 Obesity, unspecified: Secondary | ICD-10-CM

## 2024-03-29 DIAGNOSIS — Z6835 Body mass index (BMI) 35.0-35.9, adult: Secondary | ICD-10-CM | POA: Diagnosis not present

## 2024-03-29 DIAGNOSIS — R7303 Prediabetes: Secondary | ICD-10-CM

## 2024-03-29 DIAGNOSIS — R632 Polyphagia: Secondary | ICD-10-CM

## 2024-03-29 DIAGNOSIS — I10 Essential (primary) hypertension: Secondary | ICD-10-CM | POA: Diagnosis not present

## 2024-03-29 DIAGNOSIS — E6609 Other obesity due to excess calories: Secondary | ICD-10-CM

## 2024-03-29 MED ORDER — ZEPBOUND 7.5 MG/0.5ML ~~LOC~~ SOAJ: 7.5000 mg | SUBCUTANEOUS | 0 refills | Status: DC

## 2024-03-29 NOTE — Progress Notes (Signed)
 WEIGHT SUMMARY AND BIOMETRICS  Weight Lost Since Last Visit: 3lb  Weight Gained Since Last Visit: 0lb   Vitals Temp: 98 F (36.7 C) BP: (!) 142/86 Pulse Rate: 73 SpO2: 100 %   Anthropometric Measurements Height: 5' 2 (1.575 m) Weight: 195 lb (88.5 kg) BMI (Calculated): 35.66 Weight at Last Visit: 198lb Weight Lost Since Last Visit: 3lb Weight Gained Since Last Visit: 0lb Starting Weight: 204lb Total Weight Loss (lbs): 9 lb (4.082 kg)   Body Composition  Body Fat %: 42.7 % Fat Mass (lbs): 83.4 lbs Muscle Mass (lbs): 106.2 lbs Total Body Water (lbs): 76.8 lbs Visceral Fat Rating : 12   Other Clinical Data Fasting: No Labs: No Today's Visit #: 6 Starting Date: 12/09/23    OBESITY Holly Mcmillan is here to discuss her progress with her obesity treatment plan along with follow-up of her obesity related diagnoses.    Nutrition Plan: the Category 2 plan - 85% adherence.  Current exercise: aerobics and walking  Interim History:  She is down 3 lbs since her last visit.  Eating all of the food on the plan., Protein intake is as prescribed, Journaling consistently., and Water intake is adequate.   Pharmacotherapy: Holly Mcmillan is on Zepbound  5.0 mg SQ weekly Adverse side effects: None Hunger is moderately controlled.  Cravings are moderately controlled.  Assessment/Plan:   Prediabetes Last A1c was 5.3  Medication(s): Zepbound  5.0 mg SQ weekly Lab Results  Component Value Date   HGBA1C 5.3 12/09/2023   HGBA1C 5.5 03/16/2016   HGBA1C 5.5 05/22/2014   HGBA1C 5.5 01/15/2014   HGBA1C 5.1 08/28/2013   Lab Results  Component Value Date   INSULIN  9.7 12/09/2023    Plan: Will minimize all refined carbohydrates both sweets and starches.  Will work on the plan and exercise.  Consider both aerobic and resistance training.  Will keep protein, water, and fiber  intake high.  Continue and refill Zepbound  5.0 mg SQ weekly   Hypertension Hypertension borderline controlled.  Medication(s): no medications.  BP Readings from Last 3 Encounters:  03/29/24 (!) 142/86  02/24/24 138/86  02/15/24 108/60   Lab Results  Component Value Date   CREATININE 0.84 12/16/2023   CREATININE 0.76 12/09/2023   CREATININE 0.85 09/01/2023   Lab Results  Component Value Date   GFR 77.80 09/01/2023   GFR 84.04 01/14/2023   GFR 81.16 09/18/2021    Plan: Will continue exercise and add in weights.  No added salt. Will keep sodium content to 1,500 mg or less per day.      Generalized Obesity: Current BMI BMI (Calculated): 35.66   Pharmacotherapy Plan Continue and increase dose  Zepbound  7.5 mg SQ weekly  Holly Mcmillan is currently in the action stage of change. As such, her goal is to continue with weight loss efforts.  She has agreed to the Category 2 plan.  Exercise goals:  All adults should avoid inactivity. Some physical activity is better than none, and adults who participate in any amount of physical activity gain some health benefits.  Behavioral modification strategies: increasing lean protein intake, no meal skipping, meal planning , increase water intake, planning for success, increasing vegetables, keep healthy foods in the home, weigh protein portions, and measure portion sizes.  Holly Mcmillan has agreed to follow-up with our clinic in 4 weeks.     Objective:   VITALS: Per patient if applicable, see vitals. GENERAL: Alert and in no acute distress. CARDIOPULMONARY: No increased WOB. Speaking in clear sentences.  PSYCH: Pleasant and cooperative. Speech normal rate and rhythm. Affect is appropriate. Insight and judgement are appropriate. Attention is focused, linear, and appropriate.  NEURO: Oriented as arrived to appointment on time with no prompting.   Attestation Statements:   This was prepared with the assistance of Engineer, Civil (consulting).  Occasional  wrong-word or sound-a-like substitutions may have occurred due to the inherent limitations of voice recognition   Clayborne Daring, DO

## 2024-05-02 ENCOUNTER — Ambulatory Visit (INDEPENDENT_AMBULATORY_CARE_PROVIDER_SITE_OTHER): Admitting: Bariatrics

## 2024-05-02 ENCOUNTER — Encounter: Payer: Self-pay | Admitting: Bariatrics

## 2024-05-02 VITALS — BP 132/80 | HR 80 | Temp 98.1°F | Ht 62.0 in | Wt 197.0 lb

## 2024-05-02 DIAGNOSIS — Z6836 Body mass index (BMI) 36.0-36.9, adult: Secondary | ICD-10-CM | POA: Diagnosis not present

## 2024-05-02 DIAGNOSIS — E669 Obesity, unspecified: Secondary | ICD-10-CM

## 2024-05-02 DIAGNOSIS — R632 Polyphagia: Secondary | ICD-10-CM | POA: Diagnosis not present

## 2024-05-02 DIAGNOSIS — E66812 Obesity, class 2: Secondary | ICD-10-CM

## 2024-05-02 DIAGNOSIS — I10 Essential (primary) hypertension: Secondary | ICD-10-CM | POA: Diagnosis not present

## 2024-05-02 MED ORDER — ZEPBOUND 10 MG/0.5ML ~~LOC~~ SOAJ: 10.0000 mg | SUBCUTANEOUS | 0 refills | Status: DC

## 2024-05-02 NOTE — Progress Notes (Signed)
 "                                                                                                             WEIGHT SUMMARY AND BIOMETRICS  Weight Lost Since Last Visit: 0lb  Weight Gained Since Last Visit: 2lb   Vitals Temp: 98.1 F (36.7 C) BP: 132/80 Pulse Rate: 80 SpO2: 100 %   Anthropometric Measurements Height: 5' 2 (1.575 m) Weight: 197 lb (89.4 kg) BMI (Calculated): 36.02 Weight at Last Visit: 195lb Weight Lost Since Last Visit: 0lb Weight Gained Since Last Visit: 2lb Starting Weight: 204lb Total Weight Loss (lbs): 7 lb (3.175 kg)   Body Composition  Body Fat %: 42 % Fat Mass (lbs): 82.8 lbs Muscle Mass (lbs): 108.4 lbs Total Body Water (lbs): 74.8 lbs Visceral Fat Rating : 12   Other Clinical Data Fasting: Yes Labs: No Today's Visit #: 7 Starting Date: 12/09/23    OBESITY Holly Mcmillan is here to discuss her progress with her obesity treatment plan along with follow-up of her obesity related diagnoses.    Nutrition Plan: the Category 2 plan - 50% adherence.  Current exercise: walking  Interim History:  She is up 2 lbs  over the holiday. She is doing some eating in the evening.  Eating all of the food on the plan., Protein intake is as prescribed, and Water intake is adequate.   Pharmacotherapy: Holly Mcmillan is on Zepbound  7.5 mg SQ weekly Adverse side effects: None Hunger is moderately controlled.  Cravings are moderately controlled.  Assessment/Plan:   Holly Mcmillan endorses excessive hunger.  Medication(s): zepbound  Effects of medication (appetite):  moderately controlled. Cravings are moderately controlled.   Plan: Medication(s): Zepbound  10 mg SQ weekly Will increase water, protein and fiber to help assuage hunger.  Will minimize foods that have a high glucose index/load to minimize reactive hypoglycemia.   Hypertension Hypertension stable.  Medication(s): no medications.   BP Readings from Last 3 Encounters:  05/02/24 132/80   03/29/24 (!) 142/86  02/24/24 138/86   Lab Results  Component Value Date   CREATININE 0.84 12/16/2023   CREATININE 0.76 12/09/2023   CREATININE 0.85 09/01/2023   Lab Results  Component Value Date   GFR 77.80 09/01/2023   GFR 84.04 01/14/2023   GFR 81.16 09/18/2021    Plan: Continue the plan and exercise, both cardio and resistance.  No added salt. Will keep sodium content to 1,500 mg or less per day.      Generalized Obesity: Current BMI BMI (Calculated): 36.02   Pharmacotherapy Plan Continue and increase dose  Zepbound  10 mg SQ weekly ( increase from Zepbound  7.5 mg )  Holly Mcmillan is currently in the action stage of change. As such, her goal is to continue with weight loss efforts.  She has agreed to the Category 2 plan. She will adhere to the plan about 85 to 95 %.   Exercise goals: All adults should avoid inactivity. Some physical activity is better than none, and adults who participate in any amount of physical activity gain some health  benefits. She will begin using a walking pad for exercise.   Behavioral modification strategies: increasing lean protein intake, no meal skipping, meal planning , increase water intake, better snacking choices, planning for success, increasing vegetables, increasing lower sugar fruits, increasing fiber rich foods, decrease snacking , avoiding temptations, keep healthy foods in the home, weigh protein portions, measure portion sizes, work on smaller portions, and mindful eating.  Holly Mcmillan has agreed to follow-up with our clinic in 4 weeks.     Objective:   VITALS: Per patient if applicable, see vitals. GENERAL: Alert and in no acute distress. CARDIOPULMONARY: No increased WOB. Speaking in clear sentences.  PSYCH: Pleasant and cooperative. Speech normal rate and rhythm. Affect is appropriate. Insight and judgement are appropriate. Attention is focused, linear, and appropriate.  NEURO: Oriented as arrived to appointment on time with no  prompting.   Attestation Statements:   This was prepared with the assistance of Engineer, Civil (consulting).  Occasional wrong-word or sound-a-like substitutions may have occurred due to the inherent limitations of voice recognition.  Clayborne Daring, DO    "

## 2024-05-28 ENCOUNTER — Other Ambulatory Visit: Payer: Self-pay | Admitting: Internal Medicine

## 2024-05-28 DIAGNOSIS — G43011 Migraine without aura, intractable, with status migrainosus: Secondary | ICD-10-CM

## 2024-05-29 ENCOUNTER — Other Ambulatory Visit: Payer: Self-pay | Admitting: Internal Medicine

## 2024-05-29 ENCOUNTER — Encounter: Payer: Self-pay | Admitting: Internal Medicine

## 2024-05-29 DIAGNOSIS — E781 Pure hyperglyceridemia: Secondary | ICD-10-CM

## 2024-05-29 DIAGNOSIS — I2510 Atherosclerotic heart disease of native coronary artery without angina pectoris: Secondary | ICD-10-CM

## 2024-05-29 DIAGNOSIS — F325 Major depressive disorder, single episode, in full remission: Secondary | ICD-10-CM

## 2024-05-29 DIAGNOSIS — E876 Hypokalemia: Secondary | ICD-10-CM

## 2024-05-29 DIAGNOSIS — I10 Essential (primary) hypertension: Secondary | ICD-10-CM

## 2024-05-29 DIAGNOSIS — M150 Primary generalized (osteo)arthritis: Secondary | ICD-10-CM

## 2024-05-30 ENCOUNTER — Other Ambulatory Visit: Payer: Self-pay | Admitting: Internal Medicine

## 2024-05-30 DIAGNOSIS — I2510 Atherosclerotic heart disease of native coronary artery without angina pectoris: Secondary | ICD-10-CM

## 2024-05-30 DIAGNOSIS — I10 Essential (primary) hypertension: Secondary | ICD-10-CM

## 2024-05-30 DIAGNOSIS — F325 Major depressive disorder, single episode, in full remission: Secondary | ICD-10-CM

## 2024-05-30 DIAGNOSIS — E781 Pure hyperglyceridemia: Secondary | ICD-10-CM

## 2024-05-30 DIAGNOSIS — E876 Hypokalemia: Secondary | ICD-10-CM

## 2024-05-30 DIAGNOSIS — M150 Primary generalized (osteo)arthritis: Secondary | ICD-10-CM

## 2024-05-30 DIAGNOSIS — G43011 Migraine without aura, intractable, with status migrainosus: Secondary | ICD-10-CM

## 2024-05-30 MED ORDER — SUMATRIPTAN SUCCINATE 50 MG PO TABS: 50.0000 mg | ORAL_TABLET | Freq: Once | ORAL | 0 refills | Status: AC

## 2024-05-30 MED ORDER — ICOSAPENT ETHYL 1 G PO CAPS: 2.0000 g | ORAL_CAPSULE | Freq: Two times a day (BID) | ORAL | 0 refills | Status: AC

## 2024-05-30 MED ORDER — DULOXETINE HCL 60 MG PO CPEP: 60.0000 mg | ORAL_CAPSULE | Freq: Every day | ORAL | 0 refills | Status: AC

## 2024-05-30 MED ORDER — POTASSIUM CHLORIDE CRYS ER 20 MEQ PO TBCR: 20.0000 meq | EXTENDED_RELEASE_TABLET | Freq: Every day | ORAL | 0 refills | Status: AC

## 2024-05-30 NOTE — Telephone Encounter (Signed)
 Rx's will be sent Please add her as a work-in in the next month

## 2024-06-01 ENCOUNTER — Encounter: Payer: Self-pay | Admitting: Bariatrics

## 2024-06-01 ENCOUNTER — Ambulatory Visit: Payer: Self-pay | Admitting: Bariatrics

## 2024-06-01 VITALS — BP 138/85 | HR 62 | Ht 62.0 in | Wt 193.0 lb

## 2024-06-01 DIAGNOSIS — E559 Vitamin D deficiency, unspecified: Secondary | ICD-10-CM | POA: Diagnosis not present

## 2024-06-01 DIAGNOSIS — R632 Polyphagia: Secondary | ICD-10-CM | POA: Diagnosis not present

## 2024-06-01 DIAGNOSIS — E66812 Obesity, class 2: Secondary | ICD-10-CM

## 2024-06-01 DIAGNOSIS — E669 Obesity, unspecified: Secondary | ICD-10-CM

## 2024-06-01 DIAGNOSIS — Z6835 Body mass index (BMI) 35.0-35.9, adult: Secondary | ICD-10-CM

## 2024-06-01 MED ORDER — ZEPBOUND 12.5 MG/0.5ML ~~LOC~~ SOAJ: 12.5000 mg | SUBCUTANEOUS | 0 refills | Status: AC

## 2024-06-01 MED ORDER — VITAMIN D (ERGOCALCIFEROL) 1.25 MG (50000 UNIT) PO CAPS: 50000.0000 [IU] | ORAL_CAPSULE | ORAL | 0 refills | Status: AC

## 2024-06-01 NOTE — Progress Notes (Deleted)
" ° ° °                                                                                                     °    ° °  WEIGHT SUMMARY AND BIOMETRICS  No data recorded No data recorded  No data recorded No data recorded No data recorded No data recorded  OBESITY Holly Mcmillan is here to discuss her progress with her obesity treatment plan along with follow-up of her obesity related diagnoses.    Nutrition Plan: the Category 3 plan and keeping a food journal with goal of 1500-1600 calories and 90-120 grams of protein daily - ***% adherence.  Current exercise: {exercise types:16438}  Interim History:  *** {aabnutritionassessment:29213}   Pharmacotherapy: [name] is on {dwwpharmacotherapy:29109} Adverse side effects: {dwwse:29122} Hunger is {EWCONTROLASSESSMENT:24261}.  Cravings are {EWCONTROLASSESSMENT:24261}.  Assessment/Plan:   There are no diagnoses linked to this encounter.    {dwwmorbid:29108::Morbid Obesity}: Current BMI No data recorded  Pharmacotherapy Plan {dwwmed:29123}  {dwwpharmacotherapy:29109}  [name] {CHL AMB IS/IS NOT:210130109} currently in the action stage of change. As such, her goal is to {MWMwtloss#1:210800005}.  She has agreed to {dwwsldiets:29085}.  Exercise goals: {MWM EXERCISE RECS:23473}  Behavioral modification strategies: {dwwslwtlossstrategies:29088}.  [name] has agreed to follow-up with our clinic in {NUMBER 1-10:22536} weeks.   No orders of the defined types were placed in this encounter.   There are no discontinued medications.   No orders of the defined types were placed in this encounter.     Objective:   VITALS: Per patient if applicable, see vitals. GENERAL: Alert and in no acute distress. CARDIOPULMONARY: No increased WOB. Speaking in clear sentences.  PSYCH: Pleasant and cooperative. Speech normal rate and rhythm. Affect is appropriate. Insight and judgement are appropriate. Attention is focused, linear, and appropriate.  NEURO:  Oriented as arrived to appointment on time with no prompting.   Attestation Statements:   This was prepared with the assistance of Engineer, Civil (consulting).  Occasional wrong-word or sound-a-like substitutions may have occurred due to the inherent limitations of voice recognition   "

## 2024-06-01 NOTE — Progress Notes (Signed)
 "                                                                                                             WEIGHT SUMMARY AND BIOMETRICS  Weight Lost Since Last Visit: 4lb  Weight Gained Since Last Visit: 0   Vitals BP: 138/85 Pulse Rate: 62 SpO2: 100 %   Anthropometric Measurements Height: 5' 2 (1.575 m) Weight: 193 lb (87.5 kg) BMI (Calculated): 35.29 Weight at Last Visit: 197lb Weight Lost Since Last Visit: 4lb Weight Gained Since Last Visit: 0 Starting Weight: 204lb Total Weight Loss (lbs): 11 lb (4.99 kg)   Body Composition  Body Fat %: 41.7 % Fat Mass (lbs): 80.6 lbs Muscle Mass (lbs): 107 lbs Total Body Water (lbs): 74 lbs Visceral Fat Rating : 11   Other Clinical Data Fasting: no Labs: no Today's Visit #: 8 Starting Date: 12/09/23    OBESITY Jermika is here to discuss her progress with her obesity treatment plan along with follow-up of her obesity related diagnoses.    Nutrition Plan: the Category 2 plan - 80% adherence.  Current exercise:walking aerobics and walking  Interim History:  She is down 4 lbs since her last visit.  Eating all of the food on the plan., Protein intake is as prescribed, Is not skipping meals, and Water intake is adequate.   Pharmacotherapy: Lakeitha is on Zepbound  10 mg SQ weekly Adverse side effects: None Hunger is moderately controlled.  Cravings are moderately controlled.  Assessment/Plan:   Vitamin D  Deficiency Vitamin D  is not at goal of 50.  Most recent vitamin D  level was 31.4. She is on  prescription ergocalciferol  50,000 IU weekly. Lab Results  Component Value Date   VD25OH 31.4 12/09/2023    Plan: Refill prescription vitamin D  50,000 IU weekly.  Discussed sleep hygiene.    Polyphagia Britni endorses excessive hunger.  Medication(s): Zepbound  10 mg Effects of medication (appetite):  moderately controlled.  He states that her appetite is higher than what it has been and would like to go up on her  GLP-1. Cravings are moderately controlled.   Plan: Medication(s): Zepbound  12.5 mg SQ weekly Will increase water, protein and fiber to help assuage hunger.  Will minimize foods that have a high glucose index/load to minimize reactive hypoglycemia.  Will have a protein source in the mornings when she does not eat. She will start journaling on a regular basis.  Reviewed journaling options and the journaling numbers sheet was given. She will add weights to her regiment and continue her walking and aerobics and also using her walking pad.  Vitamin D  Insuffiency:  Vitamin D  is not at goal of 50.  Most recent vitamin D  level was 31.4. She is on  prescription ergocalciferol  50,000 IU weekly. Lab Results  Component Value Date   VD25OH 31.4 12/09/2023    Plan: Refill prescription vitamin D  50,000 IU weekly.     Generalized Obesity: Current BMI BMI (Calculated): 35.29   Pharmacotherapy Plan Continue and increase dose  Zepbound  12.5 mg SQ weekly  Karry is currently in the action stage of change. As such, her goal is to continue with weight loss efforts.  She has agreed to the Category 2 plan.  Exercise goals: All adults should avoid inactivity. Some physical activity is better than none, and adults who participate in any amount of physical activity gain some health benefits.  Behavioral modification strategies: increasing lean protein intake, decreasing simple carbohydrates , no meal skipping, meal planning , increase water intake, better snacking choices, planning for success, increasing vegetables, increasing fiber rich foods, decrease snacking , avoiding temptations, keep healthy foods in the home, weigh protein portions, measure portion sizes, and mindful eating.  Desmond has agreed to follow-up with our clinic in 4 weeks.       Objective:   VITALS: Per patient if applicable, see vitals. GENERAL: Alert and in no acute distress. CARDIOPULMONARY: No increased WOB. Speaking in clear  sentences.  PSYCH: Pleasant and cooperative. Speech normal rate and rhythm. Affect is appropriate. Insight and judgement are appropriate. Attention is focused, linear, and appropriate.  NEURO: Oriented as arrived to appointment on time with no prompting.   Attestation Statements:   This was prepared with the assistance of Engineer, Civil (consulting).  Occasional wrong-word or sound-a-like substitutions may have occurred due to the inherent limitations of voice recognition.   Clayborne Daring, DO     "

## 2024-06-28 ENCOUNTER — Other Ambulatory Visit

## 2024-07-03 ENCOUNTER — Ambulatory Visit: Admitting: Bariatrics

## 2024-07-05 ENCOUNTER — Ambulatory Visit: Admitting: Internal Medicine
